# Patient Record
Sex: Male | Born: 1945
Health system: Southern US, Community
[De-identification: ages and names within clinical notes are randomized; demographics above are authoritative.]

## PROBLEM LIST (undated history)

## (undated) DIAGNOSIS — I10 Essential (primary) hypertension: Secondary | ICD-10-CM

## (undated) DIAGNOSIS — G20 Parkinson's disease: Secondary | ICD-10-CM

## (undated) DIAGNOSIS — K449 Diaphragmatic hernia without obstruction or gangrene: Secondary | ICD-10-CM

## (undated) DIAGNOSIS — K5730 Diverticulosis of large intestine without perforation or abscess without bleeding: Secondary | ICD-10-CM

## (undated) DIAGNOSIS — M1990 Unspecified osteoarthritis, unspecified site: Secondary | ICD-10-CM

## (undated) DIAGNOSIS — G20A1 Parkinson's disease without dyskinesia, without mention of fluctuations: Secondary | ICD-10-CM

## (undated) DIAGNOSIS — F411 Generalized anxiety disorder: Secondary | ICD-10-CM

## (undated) DIAGNOSIS — I25810 Atherosclerosis of coronary artery bypass graft(s) without angina pectoris: Secondary | ICD-10-CM

## (undated) DIAGNOSIS — Z8679 Personal history of other diseases of the circulatory system: Secondary | ICD-10-CM

## (undated) DIAGNOSIS — F3289 Other specified depressive episodes: Secondary | ICD-10-CM

## (undated) DIAGNOSIS — K222 Esophageal obstruction: Secondary | ICD-10-CM

## (undated) DIAGNOSIS — E785 Hyperlipidemia, unspecified: Secondary | ICD-10-CM

## (undated) DIAGNOSIS — R1013 Epigastric pain: Secondary | ICD-10-CM

## (undated) DIAGNOSIS — F329 Major depressive disorder, single episode, unspecified: Secondary | ICD-10-CM

## (undated) DIAGNOSIS — I639 Cerebral infarction, unspecified: Secondary | ICD-10-CM

## (undated) DIAGNOSIS — K219 Gastro-esophageal reflux disease without esophagitis: Secondary | ICD-10-CM

## (undated) DIAGNOSIS — D126 Benign neoplasm of colon, unspecified: Secondary | ICD-10-CM

## (undated) DIAGNOSIS — I340 Nonrheumatic mitral (valve) insufficiency: Secondary | ICD-10-CM

## (undated) DIAGNOSIS — Z860100 Personal history of colon polyps, unspecified: Secondary | ICD-10-CM

## (undated) DIAGNOSIS — I2510 Atherosclerotic heart disease of native coronary artery without angina pectoris: Secondary | ICD-10-CM

## (undated) HISTORY — DX: Unspecified osteoarthritis, unspecified site: M19.90

## (undated) HISTORY — PX: CORONARY ARTERY BYPASS GRAFT: SHX141

## (undated) HISTORY — PX: LUNG BIOPSY: SHX232

## (undated) HISTORY — DX: Atherosclerotic heart disease of native coronary artery without angina pectoris: I25.10

## (undated) HISTORY — DX: Personal history of colonic polyps: Z86.010

## (undated) HISTORY — DX: Nonrheumatic mitral (valve) insufficiency: I34.0

## (undated) HISTORY — DX: Other specified depressive episodes: F32.89

## (undated) HISTORY — DX: Personal history of other diseases of the circulatory system: Z86.79

## (undated) HISTORY — DX: Benign neoplasm of colon, unspecified: D12.6

## (undated) HISTORY — PX: POLYPECTOMY: SHX149

## (undated) HISTORY — DX: Essential (primary) hypertension: I10

## (undated) HISTORY — DX: Esophageal obstruction: K22.2

## (undated) HISTORY — DX: Parkinson's disease: G20

## (undated) HISTORY — DX: Diverticulosis of large intestine without perforation or abscess without bleeding: K57.30

## (undated) HISTORY — DX: Cerebral infarction, unspecified: I63.9

## (undated) HISTORY — DX: Diaphragmatic hernia without obstruction or gangrene: K44.9

## (undated) HISTORY — PX: PTCA: SHX146

## (undated) HISTORY — DX: Hyperlipidemia, unspecified: E78.5

## (undated) HISTORY — DX: Personal history of colon polyps, unspecified: Z86.0100

## (undated) HISTORY — PX: INGUINAL HERNIA REPAIR: SUR1180

## (undated) HISTORY — PX: COLONOSCOPY: SHX174

## (undated) HISTORY — DX: Major depressive disorder, single episode, unspecified: F32.9

## (undated) HISTORY — DX: Gastro-esophageal reflux disease without esophagitis: K21.9

## (undated) HISTORY — DX: Generalized anxiety disorder: F41.1

## (undated) HISTORY — DX: Parkinson's disease without dyskinesia, without mention of fluctuations: G20.A1

---
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
## 2002-03-09 ENCOUNTER — Inpatient Hospital Stay (HOSPITAL_COMMUNITY): Admission: EM | Admit: 2002-03-09 | Discharge: 2002-03-10 | Payer: Self-pay | Admitting: *Deleted

## 2002-03-09 ENCOUNTER — Encounter: Payer: Self-pay | Admitting: *Deleted

## 2002-03-10 ENCOUNTER — Encounter: Payer: Self-pay | Admitting: Cardiology

## 2002-04-11 ENCOUNTER — Encounter: Admission: RE | Admit: 2002-04-11 | Discharge: 2002-04-11 | Payer: Self-pay | Admitting: Internal Medicine

## 2002-04-11 ENCOUNTER — Encounter: Payer: Self-pay | Admitting: Internal Medicine

## 2004-11-27 ENCOUNTER — Ambulatory Visit: Payer: Self-pay | Admitting: Internal Medicine

## 2004-12-20 ENCOUNTER — Ambulatory Visit: Payer: Self-pay | Admitting: Internal Medicine

## 2004-12-22 ENCOUNTER — Inpatient Hospital Stay (HOSPITAL_COMMUNITY): Admission: AD | Admit: 2004-12-22 | Discharge: 2005-01-04 | Payer: Self-pay | Admitting: *Deleted

## 2004-12-22 ENCOUNTER — Ambulatory Visit: Payer: Self-pay | Admitting: *Deleted

## 2004-12-23 ENCOUNTER — Ambulatory Visit: Payer: Self-pay | Admitting: Cardiology

## 2005-01-18 ENCOUNTER — Ambulatory Visit: Payer: Self-pay | Admitting: Cardiology

## 2005-02-14 ENCOUNTER — Ambulatory Visit: Payer: Self-pay | Admitting: Internal Medicine

## 2005-03-22 ENCOUNTER — Ambulatory Visit: Payer: Self-pay | Admitting: Cardiology

## 2005-04-26 ENCOUNTER — Ambulatory Visit: Payer: Self-pay | Admitting: Cardiology

## 2005-07-24 ENCOUNTER — Ambulatory Visit: Payer: Self-pay | Admitting: Cardiology

## 2005-07-24 ENCOUNTER — Ambulatory Visit: Payer: Self-pay

## 2005-07-25 ENCOUNTER — Ambulatory Visit: Payer: Self-pay | Admitting: Cardiology

## 2006-08-30 ENCOUNTER — Ambulatory Visit: Payer: Self-pay | Admitting: Internal Medicine

## 2006-08-30 LAB — CONVERTED CEMR LAB
Albumin: 3.2 g/dL — ABNORMAL LOW (ref 3.5–5.2)
Alkaline Phosphatase: 59 units/L (ref 39–117)
BUN: 21 mg/dL (ref 6–23)
Basophils Absolute: 0 10*3/uL (ref 0.0–0.1)
Bilirubin Urine: NEGATIVE
Eosinophils Absolute: 0.5 10*3/uL (ref 0.0–0.6)
GFR calc Af Amer: 79 mL/min
HDL: 35.4 mg/dL — ABNORMAL LOW (ref >39.0)
Hemoglobin, Urine: NEGATIVE
Hemoglobin: 14.2 g/dL (ref 13.0–17.0)
Leukocytes, UA: NEGATIVE
Lymphocytes Relative: 20.2 % (ref 12.0–46.0)
MCHC: 34.3 g/dL (ref 30.0–36.0)
Monocytes Absolute: 0.6 10*3/uL (ref 0.2–0.7)
Monocytes Relative: 9.4 % (ref 3.0–11.0)
Neutro Abs: 4.2 10*3/uL (ref 1.4–7.7)
PSA: 1.57 ng/mL
Potassium: 5.1 meq/L (ref 3.5–5.1)
TSH: 2.02 microintl units/mL (ref 0.35–5.50)
Total CHOL/HDL Ratio: 3.9
Total Protein: 6.5 g/dL (ref 6.0–8.3)
Triglycerides: 89 mg/dL (ref 0–149)
VLDL: 18 mg/dL (ref 0–40)

## 2006-09-03 ENCOUNTER — Ambulatory Visit: Payer: Self-pay | Admitting: Internal Medicine

## 2006-09-12 ENCOUNTER — Ambulatory Visit: Payer: Self-pay

## 2006-09-19 ENCOUNTER — Encounter: Payer: Self-pay | Admitting: Internal Medicine

## 2006-09-19 DIAGNOSIS — E669 Obesity, unspecified: Secondary | ICD-10-CM | POA: Insufficient documentation

## 2006-09-19 DIAGNOSIS — K219 Gastro-esophageal reflux disease without esophagitis: Secondary | ICD-10-CM | POA: Insufficient documentation

## 2006-09-19 DIAGNOSIS — F411 Generalized anxiety disorder: Secondary | ICD-10-CM

## 2006-09-19 DIAGNOSIS — I2510 Atherosclerotic heart disease of native coronary artery without angina pectoris: Secondary | ICD-10-CM

## 2006-09-19 DIAGNOSIS — F329 Major depressive disorder, single episode, unspecified: Secondary | ICD-10-CM | POA: Insufficient documentation

## 2006-09-19 DIAGNOSIS — Z8673 Personal history of transient ischemic attack (TIA), and cerebral infarction without residual deficits: Secondary | ICD-10-CM | POA: Insufficient documentation

## 2006-09-19 DIAGNOSIS — M1990 Unspecified osteoarthritis, unspecified site: Secondary | ICD-10-CM | POA: Insufficient documentation

## 2006-09-19 DIAGNOSIS — Z8679 Personal history of other diseases of the circulatory system: Secondary | ICD-10-CM

## 2006-09-19 DIAGNOSIS — I10 Essential (primary) hypertension: Secondary | ICD-10-CM

## 2006-09-19 DIAGNOSIS — J309 Allergic rhinitis, unspecified: Secondary | ICD-10-CM | POA: Insufficient documentation

## 2006-09-19 DIAGNOSIS — E785 Hyperlipidemia, unspecified: Secondary | ICD-10-CM

## 2006-09-19 DIAGNOSIS — F528 Other sexual dysfunction not due to a substance or known physiological condition: Secondary | ICD-10-CM

## 2006-09-19 DIAGNOSIS — Z872 Personal history of diseases of the skin and subcutaneous tissue: Secondary | ICD-10-CM | POA: Insufficient documentation

## 2006-10-08 ENCOUNTER — Ambulatory Visit: Payer: Self-pay | Admitting: Internal Medicine

## 2006-10-22 ENCOUNTER — Ambulatory Visit: Payer: Self-pay | Admitting: Internal Medicine

## 2006-10-22 ENCOUNTER — Encounter: Payer: Self-pay | Admitting: Internal Medicine

## 2007-03-20 DIAGNOSIS — K449 Diaphragmatic hernia without obstruction or gangrene: Secondary | ICD-10-CM | POA: Insufficient documentation

## 2007-03-20 DIAGNOSIS — K5730 Diverticulosis of large intestine without perforation or abscess without bleeding: Secondary | ICD-10-CM | POA: Insufficient documentation

## 2007-03-20 DIAGNOSIS — D126 Benign neoplasm of colon, unspecified: Secondary | ICD-10-CM

## 2007-10-08 ENCOUNTER — Ambulatory Visit: Payer: Self-pay | Admitting: Internal Medicine

## 2007-10-08 DIAGNOSIS — I498 Other specified cardiac arrhythmias: Secondary | ICD-10-CM

## 2007-10-08 DIAGNOSIS — J019 Acute sinusitis, unspecified: Secondary | ICD-10-CM

## 2007-11-24 ENCOUNTER — Ambulatory Visit: Payer: Self-pay | Admitting: Internal Medicine

## 2007-11-24 LAB — CONVERTED CEMR LAB
ALT: 19 units/L (ref 0–53)
Albumin: 3.4 g/dL — ABNORMAL LOW (ref 3.5–5.2)
BUN: 13 mg/dL (ref 6–23)
Basophils Relative: 0.8 % (ref 0.0–3.0)
Bilirubin Urine: NEGATIVE
Calcium: 9.4 mg/dL (ref 8.4–10.5)
Creatinine, Ser: 1.2 mg/dL (ref 0.4–1.5)
Eosinophils Relative: 9 % — ABNORMAL HIGH (ref 0.0–5.0)
GFR calc Af Amer: 79 mL/min
Glucose, Bld: 92 mg/dL (ref 70–99)
HCT: 41.4 % (ref 39.0–52.0)
HDL: 35.6 mg/dL — ABNORMAL LOW (ref >39.0)
Hemoglobin: 14.2 g/dL (ref 13.0–17.0)
Monocytes Absolute: 0.6 10*3/uL (ref 0.1–1.0)
Monocytes Relative: 8.4 % (ref 3.0–12.0)
Neutro Abs: 5.1 10*3/uL (ref 1.4–7.7)
Nitrite: NEGATIVE
RBC: 4.59 M/uL (ref 4.22–5.81)
RDW: 13.6 % (ref 11.5–14.6)
TSH: 2.94 microintl units/mL (ref 0.35–5.50)
Total Protein, Urine: NEGATIVE mg/dL
Total Protein: 6.1 g/dL (ref 6.0–8.3)
WBC: 7.7 10*3/uL (ref 4.5–10.5)
pH: 7 (ref 5.0–8.0)

## 2007-11-25 ENCOUNTER — Ambulatory Visit: Payer: Self-pay | Admitting: Internal Medicine

## 2007-11-25 DIAGNOSIS — Z860100 Personal history of colon polyps, unspecified: Secondary | ICD-10-CM | POA: Insufficient documentation

## 2007-11-26 ENCOUNTER — Telehealth (INDEPENDENT_AMBULATORY_CARE_PROVIDER_SITE_OTHER): Payer: Self-pay | Admitting: *Deleted

## 2007-12-05 ENCOUNTER — Encounter (INDEPENDENT_AMBULATORY_CARE_PROVIDER_SITE_OTHER): Payer: Self-pay | Admitting: *Deleted

## 2008-01-16 ENCOUNTER — Ambulatory Visit: Payer: Self-pay | Admitting: Cardiology

## 2008-01-22 ENCOUNTER — Ambulatory Visit: Payer: Self-pay

## 2008-03-18 ENCOUNTER — Ambulatory Visit: Payer: Self-pay | Admitting: Cardiology

## 2008-04-24 ENCOUNTER — Telehealth: Payer: Self-pay | Admitting: Internal Medicine

## 2008-04-24 ENCOUNTER — Ambulatory Visit: Payer: Self-pay | Admitting: Family Medicine

## 2008-05-03 ENCOUNTER — Telehealth: Payer: Self-pay | Admitting: Internal Medicine

## 2009-01-04 ENCOUNTER — Ambulatory Visit: Payer: Self-pay | Admitting: Internal Medicine

## 2009-01-04 DIAGNOSIS — R062 Wheezing: Secondary | ICD-10-CM | POA: Insufficient documentation

## 2009-01-05 ENCOUNTER — Ambulatory Visit: Payer: Self-pay | Admitting: Internal Medicine

## 2009-01-05 LAB — CONVERTED CEMR LAB
ALT: 23 units/L (ref 0–53)
AST: 30 units/L (ref 0–37)
Albumin: 3.6 g/dL (ref 3.5–5.2)
Bilirubin Urine: NEGATIVE
Calcium: 10 mg/dL (ref 8.4–10.5)
Creatinine, Ser: 1.1 mg/dL (ref 0.4–1.5)
Eosinophils Relative: 2.1 % (ref 0.0–5.0)
GFR calc non Af Amer: 71.48 mL/min (ref >60)
HCT: 41.3 % (ref 39.0–52.0)
HDL: 47.9 mg/dL (ref >39.00)
Hemoglobin, Urine: NEGATIVE
Hemoglobin: 14.1 g/dL (ref 13.0–17.0)
Ketones, ur: NEGATIVE mg/dL
Leukocytes, UA: NEGATIVE
Lymphs Abs: 1.5 10*3/uL (ref 0.7–4.0)
MCV: 92.9 fL (ref 78.0–100.0)
Monocytes Absolute: 0.9 10*3/uL (ref 0.1–1.0)
Neutro Abs: 6.6 10*3/uL (ref 1.4–7.7)
Nitrite: NEGATIVE
Platelets: 243 10*3/uL (ref 150.0–400.0)
RDW: 12.9 % (ref 11.5–14.6)
Sodium: 144 meq/L (ref 135–145)
TSH: 1.64 microintl units/mL (ref 0.35–5.50)
Total Bilirubin: 0.6 mg/dL (ref 0.3–1.2)
Total Protein: 6.7 g/dL (ref 6.0–8.3)
Triglycerides: 104 mg/dL (ref 0.0–149.0)
Urobilinogen, UA: 0.2 (ref 0.0–1.0)
WBC: 9.2 10*3/uL (ref 4.5–10.5)
pH: 6 (ref 5.0–8.0)

## 2009-01-25 ENCOUNTER — Ambulatory Visit: Payer: Self-pay

## 2009-01-25 ENCOUNTER — Encounter: Payer: Self-pay | Admitting: Cardiology

## 2009-01-25 DIAGNOSIS — I6529 Occlusion and stenosis of unspecified carotid artery: Secondary | ICD-10-CM

## 2009-01-26 ENCOUNTER — Encounter: Payer: Self-pay | Admitting: Cardiology

## 2009-03-31 ENCOUNTER — Telehealth: Payer: Self-pay | Admitting: Internal Medicine

## 2009-03-31 ENCOUNTER — Ambulatory Visit: Payer: Self-pay | Admitting: Cardiology

## 2009-03-31 DIAGNOSIS — I25810 Atherosclerosis of coronary artery bypass graft(s) without angina pectoris: Secondary | ICD-10-CM

## 2009-06-24 ENCOUNTER — Ambulatory Visit: Payer: Self-pay | Admitting: Internal Medicine

## 2009-08-30 ENCOUNTER — Ambulatory Visit: Payer: Self-pay | Admitting: Internal Medicine

## 2009-08-30 DIAGNOSIS — M25519 Pain in unspecified shoulder: Secondary | ICD-10-CM

## 2009-09-23 ENCOUNTER — Ambulatory Visit: Payer: Self-pay | Admitting: Internal Medicine

## 2009-12-05 ENCOUNTER — Ambulatory Visit: Payer: Self-pay | Admitting: Internal Medicine

## 2009-12-05 LAB — CONVERTED CEMR LAB
ALT: 20 units/L (ref 0–53)
AST: 20 units/L (ref 0–37)
BUN: 15 mg/dL (ref 6–23)
Basophils Absolute: 0 10*3/uL (ref 0.0–0.1)
Bilirubin Urine: NEGATIVE
Bilirubin, Direct: 0.1 mg/dL (ref 0.0–0.3)
Calcium: 9.3 mg/dL (ref 8.4–10.5)
Cholesterol: 156 mg/dL (ref 0–200)
Creatinine, Ser: 1.1 mg/dL (ref 0.4–1.5)
Eosinophils Relative: 8 % — ABNORMAL HIGH (ref 0.0–5.0)
GFR calc non Af Amer: 70.54 mL/min (ref >60)
Glucose, Bld: 95 mg/dL (ref 70–99)
HCT: 39.2 % (ref 39.0–52.0)
HDL: 39.5 mg/dL (ref >39.00)
Hemoglobin, Urine: NEGATIVE
LDL Cholesterol: 84 mg/dL (ref 0–99)
Lymphs Abs: 1.3 10*3/uL (ref 0.7–4.0)
Monocytes Relative: 8.2 % (ref 3.0–12.0)
Neutrophils Relative %: 64.6 % (ref 43.0–77.0)
Platelets: 245 10*3/uL (ref 150.0–400.0)
RDW: 14.1 % (ref 11.5–14.6)
TSH: 1.84 microintl units/mL (ref 0.35–5.50)
Total Bilirubin: 0.1 mg/dL — ABNORMAL LOW (ref 0.3–1.2)
Total Protein, Urine: NEGATIVE mg/dL
Urine Glucose: NEGATIVE mg/dL
Urobilinogen, UA: 0.2 (ref 0.0–1.0)
VLDL: 33 mg/dL (ref 0.0–40.0)
WBC: 7.2 10*3/uL (ref 4.5–10.5)

## 2009-12-08 ENCOUNTER — Encounter: Payer: Self-pay | Admitting: Internal Medicine

## 2009-12-08 ENCOUNTER — Ambulatory Visit: Payer: Self-pay | Admitting: Internal Medicine

## 2010-01-29 ENCOUNTER — Encounter: Payer: Self-pay | Admitting: Cardiothoracic Surgery

## 2010-02-01 ENCOUNTER — Telehealth: Payer: Self-pay | Admitting: Internal Medicine

## 2010-02-07 NOTE — Assessment & Plan Note (Signed)
Summary: ?shot for shoulder-lb   Vital Signs:  Patient profile:   65 year old male Height:      65 inches Weight:      168.50 pounds BMI:     28.14 O2 Sat:      95 % on Room air Temp:     98 degrees F oral Pulse rate:   54 / minute BP sitting:   132 / 70  (left arm) Cuff size:   regular  Vitals Entered By: Zella Ball Ewing CMA [facility]ull) (September 23, 2009 8:28 AM)  O2 Flow:  Room air CC: Left shoulder pain/RE   Primary Care Provider:  Corwin Levins MD  CC:  Left shoulder pain/RE.  History of Present Illness: here for acute - c/o 2 mo ongoing pain to the left shoudler, specifically the prox bicep area and also the distal bicep area, mild to mod but persistent after shoveling 4 small dumptruck loads of dirt in the back yard not accessible by backhow approx 2 mo ago  Pt denies CP, worsening sob, doe, wheezing, orthopnea, pnd, worsening LE edema, palps, dizziness or syncope  Pt denies new neuro symptoms such as headache, facial or extremity weakness  No fever, wt loss, night sweats, loss of appetite or other constitutional symptoms  Denies worsening depressive symtpoms, suicidal ideation, anxiety or panic.  Shoulder pain worse still to lift in a supination manner., even approx 20 lbs, but o/w has FROM adn use of the shoulder.    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) 4)  Coronary Artery Disease  (ICD-414.00) 5)  Transient Ischemic Attack, Hx of  (ICD-V12.50) 6)  Carotid Artery Disease  (ICD-433.10) 7)  Hypertension  (ICD-401.9) 8)  Bradycardia  (ICD-427.89) 9)  Hyperlipidemia  (ICD-272.4) 10)  Obesity  (ICD-278.00) 11)  Anxiety  (ICD-300.00) 12)  Depression  (ICD-311) 13)  Gerd  (ICD-530.81) 14)  Wheezing  (ICD-786.07) 15)  Colonic Polyps, Hx of  (ICD-V12.72) 16)  Preventive Health Care  (ICD-V70.0) 17)  Sinusitis- Acute-nos  (ICD-461.9) 18)  Hiatal Hernia  (ICD-553.3) 19)   Diverticulosis, Colon  (ICD-562.10) 20)  Colonic Polyps  (ICD-211.3) 21)  Psoriasis, Hx of  (ICD-V13.3) 22)  Osteoarthritis  (ICD-715.90) 23)  Allergic Rhinitis  (ICD-477.9) 24)  Erectile Dysfunction  (ICD-302.72)  Medications Prior to Update: 1)  Plavix 75 Mg  Tabs (Clopidogrel Bisulfate) .... Take 1 By Mouth Once Daily 2)  Pravastatin Sodium 40 Mg Tabs (Pravastatin Sodium) .[name].. 1 Tab By Mouth Daily 3)  Nexium 40 Mg Cpdr (Esomeprazole Magnesium) .[name].. 1po Once Daily 4)  Lisinopril 5 Mg Tabs (Lisinopril) .[name].. 1 Tab By Mouth Daily 5)  Levitra 20 Mg Tabs (Vardenafil Hcl) .[name].. 1 By Mouth As Needed 6)  Cephalexin 500 Mg Caps (Cephalexin) .[name].. 1po Three Times A Day 7)  Promethazine-Codeine 6.25-10 Mg/26ml Syrp (Promethazine-Codeine) .[name].. 1 Tsp By Mouth Q 6 Hrs As Needed 8)  Prednisone 10 Mg Tabs (Prednisone) .... 3po Qd For 3days, Then 2po Qd For 3days, Then 1po Qd For 3days, Then Stop  Current Medications (verified): 1)  Plavix 75 Mg  Tabs (Clopidogrel Bisulfate) .... Take 1 By Mouth Once Daily 2)  Pravastatin Sodium 40 Mg Tabs (Pravastatin Sodium) .[name].. 1 Tab By Mouth Daily 3)  Nexium 40 Mg Cpdr (Esomeprazole Magnesium) .[name].. 1po Once Daily 4)  Lisinopril 5 Mg Tabs (Lisinopril) .[name].. 1 Tab By Mouth Daily 5)  Levitra 20  Mg Tabs (Vardenafil Hcl) .[name].. 1 By Mouth As Needed 6)  Hydrocodone-Acetaminophen 7.5-325 Mg Tabs (Hydrocodone-Acetaminophen) .[name].. 1 By Mouth Q 6 Hrs As Needed  Allergies (verified): 1)  ! Lipitor 2)  ! Zocor  Past History:  Past Medical History: Last updated: 03/25/2009 Current Problems:  CORONARY ARTERY DISEASE (ICD-414.00) TRANSIENT ISCHEMIC ATTACK, HX OF (ICD-V12.50) CAROTID ARTERY DISEASE (ICD-433.10) HYPERTENSION (ICD-401.9) BRADYCARDIA (ICD-427.89) HYPERLIPIDEMIA (ICD-272.4) OBESITY (ICD-278.00) ANXIETY (ICD-300.00) DEPRESSION (ICD-311) GERD (ICD-530.81) WHEEZING (ICD-786.07) COLONIC POLYPS, HX OF (ICD-V12.72) PREVENTIVE HEALTH CARE (ICD-V70.0) SINUSITIS-  ACUTE-NOS (ICD-461.9) HIATAL HERNIA (ICD-553.3) DIVERTICULOSIS, COLON (ICD-562.10) COLONIC POLYPS (ICD-211.3) PSORIASIS, HX OF (ICD-V13.3) OSTEOARTHRITIS (ICD-715.90) ALLERGIC RHINITIS (ICD-477.9) ERECTILE DYSFUNCTION (ICD-302.72)  Past Surgical History: Last updated: 03/31/2009 Lung Biopsy Inguinal herniorrhaphy PTCA/stent-1998 s/p lipoma right thigh CABG  Dec 2006 times 2--LIMA to LAD, SVG to OM  Social History: Last updated: 06/24/2009 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   Head:  normocephalic and atraumatic.   Eyes:  vision grossly intact, pupils equal, and pupils round.   Ears:  lfet tm mild erythema, right tm ok Nose:  no external deformity and no nasal discharge.   Mouth:  no gingival abnormalities and pharyngeal erythema.   Neck:  supple and no masses.   Lungs:  normal respiratory effort and normal breath sounds.   Heart:  normal rate and regular rhythm.   Msk:  moderate tender to the left bicipital tendon insertion site, o/w left shoulder with FROM, and NT Extremities:  no edema, no erythema  Neurologic:  strength normal in all upper extremities, sensation intact to light touch, and DTRs symmetrical and normal.   Psych:  not depressed appearing and moderately anxious.     Impression & Recommendations:  Problem # 1:  SHOULDER PAIN, LEFT (ICD-719.41)  persistent, c/w bicipital tendonitis, ok fo home excercises, pain med as needed, and for now pt declines outpt PT, ortho referral, or repeat pred pack  His updated medication list for this problem includes:    Hydrocodone-acetaminophen 7.5-325 Mg Tabs (Hydrocodone-acetaminophen) .[name]... 1 by mouth q 6 hrs as needed  Problem # 2:  HYPERTENSION (ICD-401.9)  His updated medication list for this problem  includes:    Lisinopril 5 Mg Tabs (Lisinopril) .[name]... 1 tab by mouth daily  BP today: 132/70 Prior BP: 130/70 (08/30/2009)  Labs Reviewed: K+: 4.5 (01/05/2009) Creat: : 1.1 (01/05/2009)   Chol: 155 (01/05/2009)   HDL: 47.90 (01/05/2009)   LDL: 86 (01/05/2009)   TG: 104.0 (01/05/2009)  Problem # 3:  DEPRESSION (ICD-311) stable overall by hx and exam, ok to continue meds/tx as is - declines need for further ts at this time  Complete Medication List: 1)  Plavix 75 Mg Tabs (Clopidogrel bisulfate) .... Take 1 by mouth once daily 2)  Pravastatin Sodium 40 Mg Tabs (Pravastatin sodium) .[name].. 1 tab by mouth daily 3)  Nexium 40 Mg Cpdr (Esomeprazole magnesium) .[name].. 1po once daily 4)  Lisinopril 5 Mg Tabs (Lisinopril) .[name].. 1 tab by mouth daily 5)  Levitra 20 Mg Tabs (Vardenafil hcl) .[name].. 1 by mouth as needed 6)  Hydrocodone-acetaminophen 7.5-325 Mg Tabs (Hydrocodone-acetaminophen) .[name].. 1 by mouth q 6 hrs as needed  Patient Instructions: 1)  Please take all new medications as prescribed 2)  Continue all previous medications as before this visit  3)  Please schedule a follow-up appointment in 3 months  - dec 2011 with "yearly medicare exam" or sooner if needed Prescriptions: HYDROCODONE-ACETAMINOPHEN 7.5-325 MG TABS (HYDROCODONE-ACETAMINOPHEN) 1 by mouth q 6 hrs as needed  #40 x 1   Entered and Authorized by:   Corwin Levins MD   Signed by:   Corwin Levins MD on 09/23/2009   Method used:   Print then Give to Patient   RxID:   [id number]

## 2010-02-07 NOTE — Progress Notes (Signed)
Summary: RX refill  Phone Note Refill Request Call back at Home Phone [phone] Message from:  Patient on March 31, 2009 12:53 PM  Refills Requested: Medication #1:  NEXIUM 40 MG CPDR 1po once daily  Medication #2:  LISINOPRIL 5 MG TABS 1 tab by mouth daily  Medication #3:  PLAVIX 75 MG  TABS take 1 by mouth once daily  Medication #4:  PRAVASTATIN SODIUM 40 MG TABS 1 tab by mouth daily Patient is completely out of Nexium.  Initial call taken by: Lucious Groves,  March 31, 2009 12:53 PM  Follow-up for Phone Call        Nexium samples up front for pickup. Patient son will have him call the office. Follow-up by: Lucious Groves,  March 31, 2009 1:04 PM  Additional Follow-up for Phone Call Additional follow up Details #1::        Patient came by the office and picked up Nexium samples. Additional Follow-up by: Lucious Groves,  March 31, 2009 3:14 PM    Prescriptions: LISINOPRIL 5 MG TABS (LISINOPRIL) 1 tab by mouth daily  #90 x 3   Entered by:   Lucious Groves   Authorized by:   Corwin Levins MD   Signed by:   Lucious Groves on 03/31/2009   Method used:   Faxed to ...       [facility]* (mail-order)             ,          Ph: [phone]       Fax: [phone]   RxID:   [id number] NEXIUM 40 MG CPDR (ESOMEPRAZOLE MAGNESIUM) 1po once daily  #90 x 3   Entered by:   Lucious Groves   Authorized by:   Corwin Levins MD   Signed by:   Lucious Groves on 03/31/2009   Method used:   Faxed to ...       [facility]* (mail-order)             ,          Ph: [phone]       Fax: [phone]   RxID:   [id number] PRAVASTATIN SODIUM 40 MG TABS (PRAVASTATIN SODIUM) 1 tab by mouth daily  #90 x 3   Entered by:   Lucious Groves   Authorized by:   Corwin Levins MD   Signed by:   Lucious Groves on 03/31/2009   Method used:   Faxed to ...       [facility]* (mail-order)             ,          Ph: [phone]       Fax: [phone]   RxID:   [id number] PLAVIX 75 MG  TABS (CLOPIDOGREL  BISULFATE) take 1 by mouth once daily  #90 x 3   Entered by:   Lucious Groves   Authorized by:   Corwin Levins MD   Signed by:   Lucious Groves on 03/31/2009   Method used:   Faxed to ...       [facility]* (mail-order)             ,          Ph: [phone]       Fax: [phone]   RxID:   [id number]

## 2010-02-07 NOTE — Miscellaneous (Signed)
Summary: Orders Update  Clinical Lists Changes  Problems: Added new problem of CAROTID ARTERY DISEASE (ICD-433.10) Orders: Added new Test order of Carotid Duplex (Carotid Duplex) - Signed 

## 2010-02-07 NOTE — Assessment & Plan Note (Signed)
Summary: COUGH/ SHOULDER PROBLEM /NWS   Vital Signs:  Patient profile:   65 year old male Height:      65 inches Weight:      167.50 pounds BMI:     27.97 O2 Sat:      94 % on Room air Temp:     99.3 degrees F oral Pulse rate:   54 / minute BP sitting:   130 / 70  (left arm) Cuff size:   regular  Vitals Entered By: Zella Ball Ewing CMA [facility]) (August 30, 2009 9:26 AM)  O2 Flow:  Room air CC: Left shoulder pain, cough, congestion and wheezing for 2 weeks/RE   Primary Care Provider:  Corwin Levins MD  CC:  Left shoulder pain, cough, and congestion and wheezing for 2 weeks/RE.  History of Present Illness: here with acute onset 3 days fever, ST, headache, general malaise and weakness and prod cough, now with onset mild wheezing and sob/doe this am though very mild, and not affecting his overall functional or ambulatory ability.  Pt denies CP, orthopnea, pnd, worsening LE edema, palps, dizziness or syncope Pt denies new neuro symptoms such as headache, facial or extremity weakness  No wt loss, night sweats, loss of appetite or other constitutional symptoms . Overall good complaicne with meds and good tolerability though he is wary of taking prednisone due to irritability with doing this.  Also with lfet shoulder pain for 1 wk after shoveling with a family mme ber; still with ant left shoulder soreness and tender, and some slight decrased ROM, just wondinering if he really hurt himself.  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) 2)  Coronary Artery Disease  (ICD-414.00) 3)  Transient Ischemic Attack, Hx of  (ICD-V12.50) 4)  Carotid Artery Disease  (ICD-433.10) 5)  Hypertension  (ICD-401.9) 6)  Bradycardia  (ICD-427.89) 7)  Hyperlipidemia  (ICD-272.4) 8)  Obesity  (ICD-278.00) 9)  Anxiety  (ICD-300.00) 10)  Depression  (ICD-311) 11)  Gerd  (ICD-530.81) 12)  Wheezing  (ICD-786.07) 13)  Colonic  Polyps, Hx of  (ICD-V12.72) 14)  Preventive Health Care  (ICD-V70.0) 15)  Sinusitis- Acute-nos  (ICD-461.9) 16)  Hiatal Hernia  (ICD-553.3) 17)  Diverticulosis, Colon  (ICD-562.10) 18)  Colonic Polyps  (ICD-211.3) 19)  Psoriasis, Hx of  (ICD-V13.3) 20)  Osteoarthritis  (ICD-715.90) 21)  Allergic Rhinitis  (ICD-477.9) 22)  Erectile Dysfunction  (ICD-302.72)  Medications Prior to Update: 1)  Plavix 75 Mg  Tabs (Clopidogrel Bisulfate) .... Take 1 By Mouth Once Daily 2)  Pravastatin Sodium 40 Mg Tabs (Pravastatin Sodium) .[name].. 1 Tab By Mouth Daily 3)  Nexium 40 Mg Cpdr (Esomeprazole Magnesium) .[name].. 1po Once Daily 4)  Lisinopril 5 Mg Tabs (Lisinopril) .[name].. 1 Tab By Mouth Daily 5)  Levitra 20 Mg Tabs (Vardenafil Hcl) .[name].. 1 By Mouth As Needed 6)  Azithromycin 250 Mg Tabs (Azithromycin) .... 2po Qd For 1 Day, Then 1po Qd For 4days, Then Stop 7)  Promethazine-Codeine 6.25-10 Mg/65ml Syrp (Promethazine-Codeine) .[name].. 1 Tsp By Mouth Q 6 Hrs As Needed  Current Medications (verified): 1)  Plavix 75 Mg  Tabs (Clopidogrel Bisulfate) .... Take 1 By Mouth Once Daily 2)  Pravastatin Sodium 40 Mg Tabs (Pravastatin Sodium) .[name].. 1 Tab By Mouth Daily 3)  Nexium 40 Mg Cpdr (Esomeprazole Magnesium) .[name].. 1po Once Daily 4)  Lisinopril 5 Mg Tabs (Lisinopril) .[name].. 1 Tab By Mouth Daily 5)  Levitra 20 Mg Tabs (Vardenafil Hcl) .[name].. 1 By Mouth As Needed 6)  Cephalexin 500 Mg Caps (Cephalexin) .[name].. 1po Three Times A Day 7)  Promethazine-Codeine 6.25-10 Mg/94ml Syrp (Promethazine-Codeine) .[name].. 1 Tsp By Mouth Q 6 Hrs As Needed 8)  Prednisone 10 Mg Tabs (Prednisone) .... 3po Qd For 3days, Then 2po Qd For 3days, Then 1po Qd For 3days, Then Stop  Allergies (verified): 1)  ! Lipitor 2)  ! Zocor  Past History:  Past Medical History: Last updated: 03/25/2009 Current Problems:  CORONARY ARTERY DISEASE (ICD-414.00) TRANSIENT ISCHEMIC ATTACK, HX OF (ICD-V12.50) CAROTID ARTERY DISEASE (ICD-433.10) HYPERTENSION  (ICD-401.9) BRADYCARDIA (ICD-427.89) HYPERLIPIDEMIA (ICD-272.4) OBESITY (ICD-278.00) ANXIETY (ICD-300.00) DEPRESSION (ICD-311) GERD (ICD-530.81) WHEEZING (ICD-786.07) COLONIC POLYPS, HX OF (ICD-V12.72) PREVENTIVE HEALTH CARE (ICD-V70.0) SINUSITIS- ACUTE-NOS (ICD-461.9) HIATAL HERNIA (ICD-553.3) DIVERTICULOSIS, COLON (ICD-562.10) COLONIC POLYPS (ICD-211.3) PSORIASIS, HX OF (ICD-V13.3) OSTEOARTHRITIS (ICD-715.90) ALLERGIC RHINITIS (ICD-477.9) ERECTILE DYSFUNCTION (ICD-302.72)  Past Surgical History: Last updated: 03/31/2009 Lung Biopsy Inguinal herniorrhaphy PTCA/stent-1998 s/p lipoma right thigh CABG  Dec 2006 times 2--LIMA to LAD, SVG to OM  Family History: Last updated: 03/25/2009 Family history of HTN  Social History: Last updated: 06/24/2009 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   Head:  normocephalic and atraumatic.   Eyes:  vision grossly intact, pupils equal, and pupils round.   Ears:  lfet tm mild erythema, right tm ok Nose:  no external deformity and no nasal discharge.   Mouth:  no gingival abnormalities and pharyngeal erythema.   Neck:  supple and no masses.   Lungs:  normal respiratory effort, R decreased breath sounds, and L decreased breath sounds.  with  very midl bilat wheezes Heart:  normal rate and regular rhythm.   Msk:  left shoudler with tender to left ant insertion site ; but essentially FROM and no swelling Extremities:  no edema, no erythema  Neurologic:  strength normal in all upper extremities, sensation intact to light touch, and DTRs symmetrical and normal.     Impression & Recommendations:  Problem # 1:  BRONCHITIS-ACUTE (ICD-466.0)  His updated medication list for this problem includes:    Cephalexin 500 Mg Caps  (Cephalexin) .[name]... 1po three times a day    Promethazine-codeine 6.25-10 Mg/15ml Syrp (Promethazine-codeine) .[name]... 1 tsp by mouth q 6 hrs as needed treat as above, f/u any worsening signs or symptoms   Problem # 2:  WHEEZING (ICD-786.07) Assessment: New liekly due to above, for pred pack by mouth x 1  Problem # 3:  HYPERTENSION (ICD-401.9)  His updated medication list for this problem includes:    Lisinopril 5 Mg Tabs (Lisinopril) .[name]... 1 tab by mouth daily  BP today: 130/70 Prior BP: 128/80 (06/24/2009)  Labs Reviewed: K+: 4.5 (01/05/2009) Creat: : 1.1 (01/05/2009)   Chol: 155 (01/05/2009)   HDL: 47.90 (01/05/2009)   LDL: 86 (01/05/2009)   TG: 104.0 (01/05/2009) stable overall by hx and exam, ok to continue meds/tx as is   Problem # 4:  SHOULDER PAIN, LEFT (ICD-719.41) c/w bicep tendonitis/strain due to recent shoveling - treat as above, f/u any worsening signs or symptoms   Complete Medication List: 1)  Plavix 75 Mg Tabs (Clopidogrel bisulfate) .... Take 1 by mouth once daily 2)  Pravastatin Sodium 40 Mg Tabs (Pravastatin sodium) .[name].. 1 tab by  mouth daily 3)  Nexium 40 Mg Cpdr (Esomeprazole magnesium) .[name].. 1po once daily 4)  Lisinopril 5 Mg Tabs (Lisinopril) .[name].. 1 tab by mouth daily 5)  Levitra 20 Mg Tabs (Vardenafil hcl) .[name].. 1 by mouth as needed 6)  Cephalexin 500 Mg Caps (Cephalexin) .[name].. 1po three times a day 7)  Promethazine-codeine 6.25-10 Mg/79ml Syrp (Promethazine-codeine) .[name].. 1 tsp by mouth q 6 hrs as needed 8)  Prednisone 10 Mg Tabs (Prednisone) .... 3po qd for 3days, then 2po qd for 3days, then 1po qd for 3days, then stop  Patient Instructions: 1)  Please take all new medications as prescribed 2)  Continue all previous medications as before this visit  3)  Please schedule a follow-up appointment in Dec 2011 for "yearly medicare exam" Prescriptions: PREDNISONE 10 MG TABS (PREDNISONE) 3po qd for 3days, then 2po qd for 3days, then 1po qd for 3days, then stop  #18 x  0   Entered and Authorized by:   Corwin Levins MD   Signed by:   Corwin Levins MD on 08/30/2009   Method used:   Print then Give to Patient   RxID:   [id number] CEPHALEXIN 500 MG CAPS (CEPHALEXIN) 1po three times a day  #30 x 0   Entered and Authorized by:   Corwin Levins MD   Signed by:   Corwin Levins MD on 08/30/2009   Method used:   Print then Give to Patient   RxID:   [id number] PROMETHAZINE-CODEINE 6.25-10 MG/5ML SYRP (PROMETHAZINE-CODEINE) 1 tsp by mouth q 6 hrs as needed  #6oz x 1   Entered and Authorized by:   Corwin Levins MD   Signed by:   Corwin Levins MD on 08/30/2009   Method used:   Print then Give to Patient   RxID:   [id number] CEPHALEXIN 500 MG CAPS (CEPHALEXIN) 1po three times a day  #300 x 0   Entered and Authorized by:   Corwin Levins MD   Signed by:   Corwin Levins MD on 08/30/2009   Method used:   Print then Give to Patient   RxID:   [id number]

## 2010-02-07 NOTE — Assessment & Plan Note (Signed)
Summary: f1y    Visit Type:  1 year folloew up Primary Provider:  Corwin Levins MD  CC:  Sob.  History of Present Illness: He is preaching three times per week, and doing well.  No chest pain.  Has a hiatal hernia, he will get short of breath with over exertion.  Sometimes this includes working in the yard, or rushing around.  Mild discomfort eases off.  Had CABG in 2006, and negative nuclear scan in 2008.  He is not exercising all that much.    Current Medications (verified): 1)  Plavix 75 Mg  Tabs (Clopidogrel Bisulfate) .... Take 1 By Mouth Once Daily 2)  Pravastatin Sodium 40 Mg Tabs (Pravastatin Sodium) .[name].. 1 Tab By Mouth Daily 3)  Nexium 40 Mg Cpdr (Esomeprazole Magnesium) .[name].. 1po Once Daily 4)  Lisinopril 5 Mg Tabs (Lisinopril) .[name].. 1 Tab By Mouth Daily 5)  Levitra 20 Mg Tabs (Vardenafil Hcl) .[name].. 1 By Mouth As Needed  Allergies: 1)  ! Lipitor 2)  ! Zocor  Past History:  Past Medical History: Last updated: 11/25/2007 Erectile dysfunction Obesity Allergic rhinitis Anxiety Coronary artery disease Depression GERD Hyperlipidemia Hypertension Transient ischemic attack, hx of- March 2004 Osteoarthritis- Hands Psoriasis Diverticulosis, colon Colonic polyps, hx of  Family History: Last updated: 11/25/2007 HTN  Social History: Last updated: 11/25/2007 Married Occupation: pastor Former Smoker Alcohol use-no 3 children  Past Surgical History: Lung Biopsy Inguinal herniorrhaphy PTCA/stent-1998 s/p lipoma right thigh CABG  Dec 2006 times 2--LIMA to LAD, SVG to OM  Vital Signs:  Patient profile:   64 year old male Height:      64 inches Weight:      171.75 pounds BMI:     29.59 Pulse rate:   50 / minute Pulse rhythm:   irregular Resp:     18 per minute BP sitting:   144 / 72  (left arm) Cuff size:   large  Vitals Entered By: Vikki Ports (March 31, 2009 3:54 PM)  Physical Exam  Neck:  Neck supple, no JVD. No masses, thyromegaly or abnormal  cervical nodes. Chest Wall:  Mild gynecomastia Lungs:  Clear bilaterally to auscultation and percussion. Heart:  PMI non displaced.  Normal S1 and S2.  No murmur, rub, or gallop.   Abdomen:  Bowel sounds positive; abdomen soft and non-tender without masses, organomegaly, or hernias noted. No hepatosplenomegaly. Pulses:  pulses normal in all 4 extremities Extremities:  No clubbing or cyanosis. Neurologic:  Alert and oriented x 3.   EKG  Procedure date:  03/31/2009  Findings:      NSR.  Nonspecific ST and T abnormalities, similar to prior tracings.  Lateral T inversion noted, and seen on prior tracings.   Cardiac Cath  Procedure date:  12/24/2004  Findings:       CONCLUSION:  1.  Preserved left ventricular function.  2.  No evidence of high-grade restenosis at the previous stent site from      1998.  3.  Progressive disease in the left anterior descending artery after the      stent location.  4.  High-grade stenosis of the dominant circumflex overlapping the origin of      first marginal.   DISPOSITION:  I will review the films with the cardiac surgeons. My leaning  is in the direction of surgical revascularization. The patient has had a  TIA, however, and this will need to be considered in the big picture.     Cardiac Cath  Procedure  date:  12/28/2004  Findings:       OPERATION:  Coronary artery bypass grafting x2 utilizing left internal  mammary artery graft to the LAD and a endoscopically harvested saphenous  vein graft to the distal circumflex with placement of the On-Q wound  catheter system.   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   SURGEON:  Kerin Perna, M.D.  Impression & Recommendations:  Problem # 1:  CAD, AUTOLOGOUS BYPASS GRAFT (ICD-414.02) Prior CABG times two.  Seemingly remaining stable at present without significant symptoms.  Does not take ASA.   His updated medication list for  this problem includes:    Plavix 75 Mg Tabs (Clopidogrel bisulfate) .[name]... Take 1 by mouth once daily    Lisinopril 5 Mg Tabs (Lisinopril) .[name]... 1 tab by mouth daily  Problem # 2:  CAROTID ARTERY DISEASE (ICD-433.10) See carotid studies from January 2011.  Report noted. His updated medication list for this problem includes:    Plavix 75 Mg Tabs (Clopidogrel bisulfate) .[name]... Take 1 by mouth once daily  Problem # 3:  HYPERTENSION (ICD-401.9) Under the management of Dr. Jonny Ruiz His updated medication list for this problem includes:    Lisinopril 5 Mg Tabs (Lisinopril) .[name]... 1 tab by mouth daily  Problem # 4:  HYPERLIPIDEMIA (ICD-272.4) Under the management of Dr. Jonny Ruiz.   Aim for targets.  Last LDL 86, HDL 48.  Will defer to Dr. Jonny Ruiz. His updated medication list for this problem includes:    Pravastatin Sodium 40 Mg Tabs (Pravastatin sodium) .[name]... 1 tab by mouth daily  Other Orders: EKG w/ Interpretation (93000)  Patient Instructions: 1)  Your physician recommends that you continue on your current medications as directed. Please refer to the Current Medication list given to you today. 2)  Your physician wants you to follow-up in 1 year:   You will receive a reminder letter in the mail two months in advance. If you don't receive a letter, please call our office to schedule the follow-up appointment.

## 2010-02-07 NOTE — Assessment & Plan Note (Signed)
Summary: CPX/ SECURE HORIZIONS /NWS   Vital Signs:  Patient profile:   65 year old male Height:      65 inches Weight:      169.38 pounds BMI:     28.29 O2 Sat:      95 % on Room air Temp:     99 degrees F oral Pulse rate:   54 / minute BP sitting:   142 / 70  (left arm) Cuff size:   regular  Vitals Entered By: Zella Ball Ewing CMA Duncan Dull) (December 08, 2009 10:40 AM)  O2 Flow:  Room air  CC: CPX/RE   Primary Care Provider:  Corwin Levins MD  CC:  CPX/RE.  History of Present Illness: here for wellness, overall doing well,  Pt denies CP, worsening sob, doe, wheezing, orthopnea, pnd, worsening LE edema, palps, dizziness or syncope  Pt denies new neuro symptoms such as headache, facial or extremity weakness  Pt denies polydipsia, polyuria  Overall good compliance with meds, trying to follow low chol, DM diet, wt stable, little excercise however.  No fever, wt loss, night sweats, loss of appetite or other constitutional symptoms  Denies worsening depressive symptoms, suicidal ideation, or panic.  Overall good compliance with meds, and good tolerability.  Does have very occasional reflux breakthruough but overall very well controlled, without abd pain, n/v/d, bowel change or blood.  Needs nexium refill.  Due for pneumovax, delcines flu shot.  Has mild arthritic pain to mult joints including the ankles, hips, shoulders.   Levitra is too expensvie at [facility].  BP there and at home < 140/90  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) 4)  Coronary Artery Disease  (ICD-414.00) 5)  Transient Ischemic Attack, Hx of  (ICD-V12.50) 6)  Carotid Artery Disease  (ICD-433.10) 7)  Hypertension  (ICD-401.9) 8)  Bradycardia  (ICD-427.89) 9)  Hyperlipidemia  (ICD-272.4) 10)  Obesity  (ICD-278.00) 11)  Anxiety  (ICD-300.00) 12)  Depression  (ICD-311) 13)  Gerd  (ICD-530.81) 14)  Wheezing  (ICD-786.07) 15)   Colonic Polyps, Hx of  (ICD-V12.72) 16)  Preventive Health Care  (ICD-V70.0) 17)  Sinusitis- Acute-nos  (ICD-461.9) 18)  Hiatal Hernia  (ICD-553.3) 19)  Diverticulosis, Colon  (ICD-562.10) 20)  Colonic Polyps  (ICD-211.3) 21)  Psoriasis, Hx of  (ICD-V13.3) 22)  Osteoarthritis  (ICD-715.90) 23)  Allergic Rhinitis  (ICD-477.9) 24)  Erectile Dysfunction  (ICD-302.72)  Medications Prior to Update: 1)  Plavix 75 Mg  Tabs (Clopidogrel Bisulfate) .... Take 1 By Mouth Once Daily 2)  Pravastatin Sodium 40 Mg Tabs (Pravastatin Sodium) .[name].. 1 Tab By Mouth Daily 3)  Nexium 40 Mg Cpdr (Esomeprazole Magnesium) .[name].. 1po Once Daily 4)  Lisinopril 5 Mg Tabs (Lisinopril) .[name].. 1 Tab By Mouth Daily 5)  Levitra 20 Mg Tabs (Vardenafil Hcl) .[name].. 1 By Mouth As Needed 6)  Hydrocodone-Acetaminophen 7.5-325 Mg Tabs (Hydrocodone-Acetaminophen) .[name].. 1 By Mouth Q 6 Hrs As Needed  Current Medications (verified): 1)  Plavix 75 Mg  Tabs (Clopidogrel Bisulfate) .... Take 1 By Mouth Once Daily 2)  Pravastatin Sodium 40 Mg Tabs (Pravastatin Sodium) .[name].. 1 Tab By Mouth Daily 3)  Nexium 40 Mg Cpdr (Esomeprazole Magnesium) .[name].. 1po Once Daily 4)  Lisinopril 5 Mg Tabs (Lisinopril) .[name].. 1 Tab By Mouth Daily 5)  Levitra 20 Mg Tabs (Vardenafil Hcl) .[name].. 1 By Mouth As Needed 6)  Hydrocodone-Acetaminophen 7.5-325 Mg Tabs (Hydrocodone-Acetaminophen) .Marland KitchenMarland KitchenMarland Kitchen  1 By Mouth Q 6 Hrs As Needed  Allergies (verified): 1)  ! Lipitor 2)  ! Zocor  Past History:  Past Medical History: Last updated: 03/25/2009 Current Problems:  CORONARY ARTERY DISEASE (ICD-414.00) TRANSIENT ISCHEMIC ATTACK, HX OF (ICD-V12.50) CAROTID ARTERY DISEASE (ICD-433.10) HYPERTENSION (ICD-401.9) BRADYCARDIA (ICD-427.89) HYPERLIPIDEMIA (ICD-272.4) OBESITY (ICD-278.00) ANXIETY (ICD-300.00) DEPRESSION (ICD-311) GERD (ICD-530.81) WHEEZING (ICD-786.07) COLONIC POLYPS, HX OF (ICD-V12.72) PREVENTIVE HEALTH CARE (ICD-V70.0) SINUSITIS- ACUTE-NOS (ICD-461.9) HIATAL  HERNIA (ICD-553.3) DIVERTICULOSIS, COLON (ICD-562.10) COLONIC POLYPS (ICD-211.3) PSORIASIS, HX OF (ICD-V13.3) OSTEOARTHRITIS (ICD-715.90) ALLERGIC RHINITIS (ICD-477.9) ERECTILE DYSFUNCTION (ICD-302.72)  Past Surgical History: Last updated: 03/31/2009 Lung Biopsy Inguinal herniorrhaphy PTCA/stent-1998 s/p lipoma right thigh CABG  Dec 2006 times 2--LIMA to LAD, SVG to OM  Family History: Last updated: 03/25/2009 Family history of HTN  Social History: Last updated: 06/24/2009 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         all otherwise negative per pt -  except for rare "flutters" in the chest every 2 mo wihtout assoc symptoms suc as CP or sob, lasting 30 sec ; bicep tendonitis now improved   Physical Exam  General:  alert and overweight-appearing.   Head:  normocephalic and atraumatic.   Eyes:  vision grossly intact, pupils equal, and pupils round.   Ears:  lfet tm mild erythema, right tm ok Nose:  no external deformity and no nasal discharge.   Mouth:  no gingival abnormalities and pharyngeal erythema.   Neck:  supple and no masses.   Lungs:  normal respiratory effort and normal breath sounds.   Heart:  normal rate and regular rhythm.   Abdomen:  soft, non-tender, and normal bowel sounds.   Msk:  no joint tenderness and no joint swelling.   Extremities:  no edema, no erythema  Neurologic:  cranial nerves II-XII intact and strength normal in all extremities.     Skin:  color normal and no rashes.   Psych:  not depressed appearing and slightly anxious.     Impression & Recommendations:  Problem # 1:  Preventive Health Care (ICD-V70.0) Overall doing well, age appropriate education and counseling updated, referral for preventive services and immunizations addressed, dietary counseling and smoking status adressed , most recent labs reviewed, ecg reviewed I have personally reviewed and have noted 1.The patient's medical and social history 2.Their use of alcohol, tobacco or illicit drugs 3.Their current medications and supplements 4. Functional ability including ADL's, fall risk, home safety risk, hearing & visual impairment  5.Diet and physical activities 6.Evidence for depression or mood disorders The patients weight, height, BMI  have been recorded in the chart I have made referrals, counseling and provided education to the patient based review of the above  Orders: EKG w/ Interpretation (93000)  Problem # 2:  HYPERTENSION (ICD-401.9)  His updated medication list for this problem includes:    Lisinopril 5 Mg Tabs (Lisinopril) .[name]... 1 tab by mouth daily  BP today: 142/70 Prior BP: 132/70 (09/23/2009)  Labs Reviewed: K+: 4.5 (12/05/2009) Creat: : 1.1 (12/05/2009)   Chol: 156 (12/05/2009)   HDL: 39.50 (12/05/2009)   LDL: 84 (12/05/2009)   TG: 165.0 (12/05/2009) stable overall  by hx and exam, ok to continue meds/tx as is   Problem # 3:  OSTEOARTHRITIS (ICD-715.90)  His updated medication list for this problem includes:    Hydrocodone-acetaminophen 7.5-325 Mg Tabs (Hydrocodone-acetaminophen) .[name]... 1 by mouth q 6 hrs as needed limited rx to cont as above, but should try tylenol arthriits as needed   Complete Medication List: 1)  Plavix 75 Mg Tabs (Clopidogrel bisulfate) .... Take 1 by mouth once daily 2)  Pravastatin Sodium 40 Mg Tabs (Pravastatin sodium) .[name].. 1 tab by mouth daily 3)  Nexium 40 Mg Cpdr (Esomeprazole magnesium) .[name].. 1po  once daily 4)  Lisinopril 5 Mg Tabs (Lisinopril) .[name].. 1 tab by mouth daily 5)  Levitra 20 Mg Tabs (Vardenafil hcl) .[name].. 1 by mouth as needed 6)  Hydrocodone-acetaminophen 7.5-325 Mg Tabs (Hydrocodone-acetaminophen) .[name].. 1 by mouth q 6 hrs as needed  Other Orders: Pneumococcal Vaccine (16109) Admin 1st Vaccine (60454)  Patient Instructions: 1)  you had the pneumonia shot today 2)  you can use tylenol arthritis (or its generic) at 1-2 by mouth as needed for pain 3)  remember the levitra is $9 per pill at [facility] and [facility] 4)  Continue all previous medications as before this visit  5)  Please schedule a follow-up appointment in 6 months with: 6)  Lipid Panel prior to visit, ICD-9: 272.0 7)  Hepatic Panel prior to visit, ICD-9: v58.69 Prescriptions: NEXIUM 40 MG CPDR (ESOMEPRAZOLE MAGNESIUM) 1po once daily  #90 x 3   Entered and Authorized by:   Corwin Levins MD   Signed by:   Corwin Levins MD on 12/08/2009   Method used:   Print then Give to Patient   RxID:   [id number] LEVITRA 20 MG TABS (VARDENAFIL HCL) 1 by mouth as needed  #5 x 11   Entered and Authorized by:   Corwin Levins MD   Signed by:   Corwin Levins MD on 12/08/2009   Method used:   Print then Give to Patient   RxID:   [id number]    Orders Added: 1)  EKG w/ Interpretation [93000] 2)  Pneumococcal Vaccine [41324] 3)  Admin 1st Vaccine [90471] 4)  Est. Patient 65& > [40102]   Immunizations Administered:  Pneumonia Vaccine:    Vaccine Type: Pneumovax    Site: right deltoid    Mfr: Merck    Dose: 0.5 ml    Route: IM    Given by: Zella Ball Ewing CMA (AAMA)    Exp. Date: 05/05/2011    Lot #: [id number]    VIS given: 12/13/08 version given December 08, 2009.   Immunizations Administered:  Pneumonia Vaccine:    Vaccine Type: Pneumovax    Site: right deltoid    Mfr: Merck    Dose: 0.5 ml    Route: IM    Given by: Zella Ball Ewing CMA (AAMA)    Exp. Date: 05/05/2011    Lot #: [id number]    VIS given: 12/13/08  version given December 08, 2009.

## 2010-02-07 NOTE — Assessment & Plan Note (Signed)
Summary: BRONCHITIS? /NWS   Vital Signs:  Patient profile:   65 year old male Height:      64 inches Weight:      167.75 pounds O2 Sat:      97 % on Room air Temp:     97.8 degrees F oral Pulse rate:   56 / minute BP sitting:   128 / 80  (left arm) Cuff size:   regular  Vitals Entered By: Margaret Pyle, CMA (June 24, 2009 4:44 PM)  O2 Flow:  Room air  CC: Productive cough x 2 weeks   Primary Care Provider:  Corwin Levins MD  CC:  Productive cough x 2 weeks.  History of Present Illness: here with 3 days onset mild to mod fever, ST, prod cough with greenish sputum,  but Pt denies CP, sob, doe, wheezing, orthopnea, pnd, worsening LE edema, palps, dizziness or syncope  Pt denies new neuro symptoms such as headache, facial or extremity weakness Die have significant flare of reflux however associated for several days and took two times a day nexium, and asks for several refills to make up for it;  but no dysphagia, n/v, abd pain, bowel change, wt loss or blood.  Did also incidently have bilat heel adn foot pain over the past wk with burning pain but no trauma, injury, swelling, erythema or known nerve damage.  Pain is worse at the end of the day after walking and standing all day, somewhat better with shoe inserts recently but has hard soled shoes.  Also asks for refill levitra - works well , but [employer] getting ready to run out when he turns 64 and asks for 3 mo rx prior.     Preventive Screening-Counseling & Management      Drug Use:  no.    Problems Prior to Update: 1)  Bronchitis-acute  (ICD-466.0) 2)  Cad, Autologous Bypass Graft  (ICD-414.02) 3)  Coronary Artery Disease  (ICD-414.00) 4)  Transient Ischemic Attack, Hx of  (ICD-V12.50) 5)  Carotid Artery Disease  (ICD-433.10) 6)  Hypertension  (ICD-401.9) 7)  Bradycardia  (ICD-427.89) 8)  Hyperlipidemia  (ICD-272.4) 9)  Obesity  (ICD-278.00) 10)  Anxiety  (ICD-300.00) 11)  Depression  (ICD-311) 12)  Gerd   (ICD-530.81) 13)  Wheezing  (ICD-786.07) 14)  Colonic Polyps, Hx of  (ICD-V12.72) 15)  Preventive Health Care  (ICD-V70.0) 16)  Sinusitis- Acute-nos  (ICD-461.9) 17)  Hiatal Hernia  (ICD-553.3) 18)  Diverticulosis, Colon  (ICD-562.10) 19)  Colonic Polyps  (ICD-211.3) 20)  Psoriasis, Hx of  (ICD-V13.3) 21)  Osteoarthritis  (ICD-715.90) 22)  Allergic Rhinitis  (ICD-477.9) 23)  Erectile Dysfunction  (ICD-302.72)  Medications Prior to Update: 1)  Plavix 75 Mg  Tabs (Clopidogrel Bisulfate) .... Take 1 By Mouth Once Daily 2)  Pravastatin Sodium 40 Mg Tabs (Pravastatin Sodium) .[name].. 1 Tab By Mouth Daily 3)  Nexium 40 Mg Cpdr (Esomeprazole Magnesium) .[name].. 1po Once Daily 4)  Lisinopril 5 Mg Tabs (Lisinopril) .[name].. 1 Tab By Mouth Daily 5)  Levitra 20 Mg Tabs (Vardenafil Hcl) .[name].. 1 By Mouth As Needed  Current Medications (verified): 1)  Plavix 75 Mg  Tabs (Clopidogrel Bisulfate) .... Take 1 By Mouth Once Daily 2)  Pravastatin Sodium 40 Mg Tabs (Pravastatin Sodium) .[name].. 1 Tab By Mouth Daily 3)  Nexium 40 Mg Cpdr (Esomeprazole Magnesium) .[name].. 1po Once Daily 4)  Lisinopril 5 Mg Tabs (Lisinopril) .[name].. 1 Tab By Mouth Daily 5)  Levitra 20 Mg Tabs (Vardenafil Hcl) .Marland KitchenMarland KitchenMarland Kitchen  1 By Mouth As Needed 6)  Azithromycin 250 Mg Tabs (Azithromycin) .... 2po Qd For 1 Day, Then 1po Qd For 4days, Then Stop 7)  Promethazine-Codeine 6.25-10 Mg/37ml Syrp (Promethazine-Codeine) .[name].. 1 Tsp By Mouth Q 6 Hrs As Needed  Allergies (verified): 1)  ! Lipitor 2)  ! Zocor  Past History:  Past Medical History: Last updated: 03/25/2009 Current Problems:  CORONARY ARTERY DISEASE (ICD-414.00) TRANSIENT ISCHEMIC ATTACK, HX OF (ICD-V12.50) CAROTID ARTERY DISEASE (ICD-433.10) HYPERTENSION (ICD-401.9) BRADYCARDIA (ICD-427.89) HYPERLIPIDEMIA (ICD-272.4) OBESITY (ICD-278.00) ANXIETY (ICD-300.00) DEPRESSION (ICD-311) GERD (ICD-530.81) WHEEZING (ICD-786.07) COLONIC POLYPS, HX OF (ICD-V12.72) PREVENTIVE HEALTH CARE  (ICD-V70.0) SINUSITIS- ACUTE-NOS (ICD-461.9) HIATAL HERNIA (ICD-553.3) DIVERTICULOSIS, COLON (ICD-562.10) COLONIC POLYPS (ICD-211.3) PSORIASIS, HX OF (ICD-V13.3) OSTEOARTHRITIS (ICD-715.90) ALLERGIC RHINITIS (ICD-477.9) ERECTILE DYSFUNCTION (ICD-302.72)  Past Surgical History: Last updated: 03/31/2009 Lung Biopsy Inguinal herniorrhaphy PTCA/stent-1998 s/p lipoma right thigh CABG  Dec 2006 times 2--LIMA to LAD, SVG to OM  Social History: Last updated: 06/24/2009 Married Occupation: pastor Former Smoker Alcohol use-no 3 children Drug use-no  Risk Factors: Smoking Status: quit (11/25/2007)  Social History: Reviewed history from 03/25/2009 and no changes required. Married Occupation: Education officer, environmental Former Smoker Alcohol use-no 3 children Drug use-no Drug Use:  no  Review of Systems       all otherwise negative per pt -    Physical Exam  General:  alert and overweight-appearing.  , mild ill  Head:  normocephalic and atraumatic.   Eyes:  vision grossly intact, pupils equal, and pupils round.   Ears:  left tm mod erythema, right tm ok, canals clear Nose:  nasal dischargemucosal pallor and mucosal edema.   Mouth:  pharyngeal erythema and fair dentition.   Neck:  supple and no masses.   Lungs:  normal respiratory effort and normal breath sounds.   Heart:  normal rate and regular rhythm.   Abdomen:  soft, non-tender, and normal bowel sounds.   Extremities:  no edema, no erythema    Impression & Recommendations:  Problem # 1:  BRONCHITIS-ACUTE (ICD-466.0)  His updated medication list for this problem includes:    Azithromycin 250 Mg Tabs (Azithromycin) .[name]... 2po qd for 1 day, then 1po qd for 4days, then stop    Promethazine-codeine 6.25-10 Mg/37ml Syrp (Promethazine-codeine) .[name]... 1 tsp by mouth q 6 hrs as needed treat as above, f/u any worsening signs or symptoms   Problem # 2:  GERD (ICD-530.81)  His updated medication list for this problem includes:    Nexium 40  Mg Cpdr (Esomeprazole magnesium) .[name]... 1po once daily treat as above, f/u any worsening signs or symptoms  - stable overall by hx and exam, ok to continue meds/tx as is   Problem # 3:  ERECTILE DYSFUNCTION (ICD-302.72)  His updated medication list for this problem includes:    Levitra 20 Mg Tabs (Vardenafil hcl) .[name]... 1 by mouth as needed treat as above, f/u any worsening signs or symptoms , stable overall by hx and exam, ok to continue meds/tx as is   Problem # 4:  HYPERTENSION (ICD-401.9)  His updated medication list for this problem includes:    Lisinopril 5 Mg Tabs (Lisinopril) .[name]... 1 tab by mouth daily  BP today: 128/80 Prior BP: 144/72 (03/31/2009)  Labs Reviewed: K+: 4.5 (01/05/2009) Creat: : 1.1 (01/05/2009)   Chol: 155 (01/05/2009)   HDL: 47.90 (01/05/2009)   LDL: 86 (01/05/2009)   TG: 104.0 (01/05/2009) stable overall by hx and exam, ok to continue meds/tx as is   Complete Medication List: 1)  Plavix  75 Mg Tabs (Clopidogrel bisulfate) .... Take 1 by mouth once daily 2)  Pravastatin Sodium 40 Mg Tabs (Pravastatin sodium) .[name].. 1 tab by mouth daily 3)  Nexium 40 Mg Cpdr (Esomeprazole magnesium) .[name].. 1po once daily 4)  Lisinopril 5 Mg Tabs (Lisinopril) .[name].. 1 tab by mouth daily 5)  Levitra 20 Mg Tabs (Vardenafil hcl) .[name].. 1 by mouth as needed 6)  Azithromycin 250 Mg Tabs (Azithromycin) .... 2po qd for 1 day, then 1po qd for 4days, then stop 7)  Promethazine-codeine 6.25-10 Mg/32ml Syrp (Promethazine-codeine) .[name].. 1 tsp by mouth q 6 hrs as needed  Patient Instructions: 1)  Please take all new medications as prescribed 2)  Continue all previous medications as before this visit  3)  Please schedule a follow-up appointment in Dec 2011 for "yearly medicare exam" Prescriptions: PROMETHAZINE-CODEINE 6.25-10 MG/5ML SYRP (PROMETHAZINE-CODEINE) 1 tsp by mouth q 6 hrs as needed  #6oz x 1   Entered and Authorized by:   Corwin Levins MD   Signed by:   Corwin Levins MD on 06/24/2009    Method used:   Print then Give to Patient   RxID:   [id number] AZITHROMYCIN 250 MG TABS (AZITHROMYCIN) 2po qd for 1 day, then 1po qd for 4days, then stop  #6 x 1   Entered and Authorized by:   Corwin Levins MD   Signed by:   Corwin Levins MD on 06/24/2009   Method used:   Print then Give to Patient   RxID:   [id number] LEVITRA 20 MG TABS (VARDENAFIL HCL) 1 by mouth as needed  #30 x 3   Entered and Authorized by:   Corwin Levins MD   Signed by:   Corwin Levins MD on 06/24/2009   Method used:   Print then Give to Patient   RxID:   [id number] LEVITRA 20 MG TABS (VARDENAFIL HCL) 1 by mouth as needed  #30 x 3   Entered and Authorized by:   Corwin Levins MD   Signed by:   Corwin Levins MD on 06/24/2009   Method used:   Print then Give to Patient   RxID:   [id number]

## 2010-02-09 NOTE — Progress Notes (Signed)
Summary: Rx refill req?  Phone Note Call from Patient   Caller: Patient [phone] Summary of Call: Pt called requesting rx refill of Nitroglycerin. Pt states he last had Rx filled 08/2006 and refill have been expired, please advise Initial call taken by: Margaret Pyle, CMA,  February 01, 2010 10:57 AM  Follow-up for Phone Call        ok - to robin to handle Follow-up by: Corwin Levins MD,  February 01, 2010 1:06 PM    New/Updated Medications: NITROSTAT 0.4 MG SUBL (NITROGLYCERIN) use as directed as needed Prescriptions: NITROSTAT 0.4 MG SUBL (NITROGLYCERIN) use as directed as needed  #30 x 3   Entered by:   Zella Ball Ewing CMA (AAMA)   Authorized by:   Corwin Levins MD   Signed by:   Scharlene Gloss CMA (AAMA) on 02/01/2010   Method used:   Faxed to ...       [facility]  [phone]* (retail)       [address]       [place], [place]  [address]       Ph: [phone]       Fax: [phone]   RxID:   [id number]

## 2010-03-06 ENCOUNTER — Encounter: Payer: Self-pay | Admitting: Cardiology

## 2010-03-06 ENCOUNTER — Other Ambulatory Visit: Payer: Self-pay | Admitting: Cardiology

## 2010-03-06 ENCOUNTER — Ambulatory Visit (INDEPENDENT_AMBULATORY_CARE_PROVIDER_SITE_OTHER): Payer: Medicare Other | Admitting: Cardiology

## 2010-03-06 DIAGNOSIS — I209 Angina pectoris, unspecified: Secondary | ICD-10-CM

## 2010-03-06 DIAGNOSIS — E785 Hyperlipidemia, unspecified: Secondary | ICD-10-CM

## 2010-03-06 DIAGNOSIS — I2510 Atherosclerotic heart disease of native coronary artery without angina pectoris: Secondary | ICD-10-CM

## 2010-03-06 DIAGNOSIS — I10 Essential (primary) hypertension: Secondary | ICD-10-CM

## 2010-03-06 DIAGNOSIS — E7800 Pure hypercholesterolemia, unspecified: Secondary | ICD-10-CM

## 2010-03-06 LAB — CBC WITH DIFFERENTIAL/PLATELET
Basophils Absolute: 0.1 10*3/uL (ref 0.0–0.1)
Basophils Relative: 0.6 % (ref 0.0–3.0)
Eosinophils Absolute: 0.5 10*3/uL (ref 0.0–0.7)
MCHC: 34.2 g/dL (ref 30.0–36.0)
MCV: 92.2 fl (ref 78.0–100.0)
Monocytes Absolute: 0.6 10*3/uL (ref 0.1–1.0)
Neutrophils Relative %: 68.8 % (ref 43.0–77.0)
Platelets: 261 10*3/uL (ref 150.0–400.0)
RDW: 14.8 % — ABNORMAL HIGH (ref 11.5–14.6)

## 2010-03-06 LAB — BASIC METABOLIC PANEL
BUN: 13 mg/dL (ref 6–23)
CO2: 29 mEq/L (ref 19–32)
Calcium: 9.9 mg/dL (ref 8.4–10.5)
Chloride: 110 mEq/L (ref 96–112)
Creatinine, Ser: 1.1 mg/dL (ref 0.4–1.5)

## 2010-03-06 LAB — PROTIME-INR
INR: 1 ratio (ref 0.8–1.0)
Prothrombin Time: 10.8 s (ref 10.2–12.4)

## 2010-03-08 ENCOUNTER — Ambulatory Visit (HOSPITAL_COMMUNITY): Payer: Medicare Other

## 2010-03-08 ENCOUNTER — Ambulatory Visit (HOSPITAL_COMMUNITY)
Admission: RE | Admit: 2010-03-08 | Discharge: 2010-03-08 | Disposition: A | Discharge status: Home / Self Care | Payer: Medicare Other | Source: Ambulatory Visit | Attending: Cardiology | Admitting: Cardiology

## 2010-03-08 DIAGNOSIS — I2510 Atherosclerotic heart disease of native coronary artery without angina pectoris: Secondary | ICD-10-CM | POA: Insufficient documentation

## 2010-03-08 DIAGNOSIS — Z951 Presence of aortocoronary bypass graft: Secondary | ICD-10-CM | POA: Insufficient documentation

## 2010-03-08 DIAGNOSIS — R079 Chest pain, unspecified: Secondary | ICD-10-CM | POA: Insufficient documentation

## 2010-03-16 ENCOUNTER — Encounter: Payer: Self-pay | Admitting: Cardiology

## 2010-03-16 ENCOUNTER — Encounter (INDEPENDENT_AMBULATORY_CARE_PROVIDER_SITE_OTHER): Payer: Self-pay | Admitting: *Deleted

## 2010-03-16 ENCOUNTER — Encounter (INDEPENDENT_AMBULATORY_CARE_PROVIDER_SITE_OTHER): Payer: Medicare Other | Admitting: Cardiology

## 2010-03-16 DIAGNOSIS — E7800 Pure hypercholesterolemia, unspecified: Secondary | ICD-10-CM

## 2010-03-16 DIAGNOSIS — I2510 Atherosclerotic heart disease of native coronary artery without angina pectoris: Secondary | ICD-10-CM

## 2010-03-16 NOTE — Letter (Signed)
Summary: Cardiac Catheterization Instructions- Main Lab  [facility], Main Office  [address]   [place], [place] [address]   Phone: [phone]  Fax: [phone]     03/06/2010 MRN: [id number]  Brycen Sanford [address] [place], [place]  [address]  [place]  Dear Mr. Schwandt,   You are scheduled for Cardiac Catheterization on March 08, 2010              with Dr. Riley Kill.  Please arrive at the [facility] at 9:30      a.m. on the day of your procedure.  1. DIET     _X___ Nothing to eat or drink after midnight except your medications with a sip of water.  2. MAKE SURE YOU TAKE YOUR ASPIRIN AND PLAVIX.  3. _X___ YOU MAY TAKE ALL of your remaining medications with a small amount of water.       4. Plan for one night stay - bring personal belongings (i.e. toothpaste, toothbrush, etc.)  5. Bring a current list of your medications and current insurance cards.  6. Must have a responsible person to drive you home.   7. Someone must be with you for the first 24 hours after you arrive home.  8. Please wear clothes that are easy to get on and off and wear slip-on shoes.  *Special note: Every effort is made to have your procedure done on time.  Occasionally there are emergencies that present themselves at the hospital that may cause delays.  Please be patient if a delay does occur.  If you have any questions after you get home, please call the office at the number listed above.  Julieta Gutting, RN, BSN

## 2010-03-16 NOTE — Assessment & Plan Note (Signed)
Summary: f1y    Visit Type:  Follow-up Primary Provider:  Corwin Levins MD  CC:  Chest discomfort.  History of Present Illness: OPERATION:  Coronary artery bypass grafting x2 utilizing left internal  mammary artery graft to the LAD and a endoscopically harvested saphenous  vein graft to the distal circumflex with placement of the On-Q wound  catheter system.   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   SURGEON:  Kerin Perna, M.D.  Patient moved up his appointment becasue of his symptoms.  He feels he is having something similar to what he had before CABG.  Specifically, he has occasional to frequent chest discomfort mostly with activity.  He denies rest chest pain, and he has noted some onset of shortness of breath with activity.  He got them to move up his appointment.    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) 4)  Coronary Artery Disease  (ICD-414.00) 5)  Transient Ischemic Attack, Hx of  (ICD-V12.50) 6)  Carotid Artery Disease  (ICD-433.10) 7)  Hypertension  (ICD-401.9) 8)  Bradycardia  (ICD-427.89) 9)  Hyperlipidemia  (ICD-272.4) 10)  Obesity  (ICD-278.00) 11)  Anxiety  (ICD-300.00) 12)  Depression  (ICD-311) 13)  Gerd  (ICD-530.81) 14)  Wheezing  (ICD-786.07) 15)  Colonic Polyps, Hx of  (ICD-V12.72) 16)  Preventive Health Care  (ICD-V70.0) 17)  Sinusitis- Acute-nos  (ICD-461.9) 18)  Hiatal Hernia  (ICD-553.3) 19)  Diverticulosis, Colon  (ICD-562.10) 20)  Colonic Polyps  (ICD-211.3) 21)  Psoriasis, Hx of  (ICD-V13.3) 22)  Osteoarthritis  (ICD-715.90) 23)  Allergic Rhinitis  (ICD-477.9) 24)  Erectile Dysfunction  (ICD-302.72)  Current Medications (verified): 1)  Plavix 75 Mg  Tabs (Clopidogrel Bisulfate) .... Take 1 By Mouth Once Daily 2)  Pravastatin Sodium 40 Mg Tabs (Pravastatin Sodium) .[name].. 1 Tab By  Mouth Daily 3)  Nexium 40 Mg Cpdr (Esomeprazole Magnesium) .[name].. 1po Once Daily 4)  Lisinopril 5 Mg Tabs (Lisinopril) .[name].. 1 Tab By Mouth Daily 5)  Levitra 20 Mg Tabs (Vardenafil Hcl) .[name].. 1 By Mouth As Needed 6)  Nitrostat 0.4 Mg Subl (Nitroglycerin) .... Use As Directed As Needed 7)  Aspirin 81 Mg Tbec (Aspirin) .... Take One Tablet By Mouth Daily 8)  Vitamin E 400 Unit Caps (Vitamin E) .... Take 1 Capsule By Mouth Once A Day 9)  Vitamin B Complex  Tabs (B Complex Vitamins) .... Take 1 Tablet By Mouth Once A Day 10)  Fish Oil 1000 Mg Caps (Omega-3 Fatty Acids) .[name].. 1-2-Daily  Allergies: 1)  ! Lipitor 2)  ! Zocor  Past History:  Past Medical History: Last updated: 03/25/2009 Current Problems:  CORONARY ARTERY DISEASE (ICD-414.00) TRANSIENT ISCHEMIC ATTACK, HX OF (ICD-V12.50) CAROTID ARTERY DISEASE (ICD-433.10) HYPERTENSION (ICD-401.9) BRADYCARDIA (ICD-427.89) HYPERLIPIDEMIA (ICD-272.4) OBESITY (ICD-278.00) ANXIETY (ICD-300.00) DEPRESSION (ICD-311) GERD (ICD-530.81) WHEEZING (ICD-786.07) COLONIC POLYPS, HX OF (ICD-V12.72) PREVENTIVE HEALTH CARE (ICD-V70.0) SINUSITIS- ACUTE-NOS (ICD-461.9) HIATAL HERNIA (ICD-553.3) DIVERTICULOSIS, COLON (ICD-562.10) COLONIC POLYPS (ICD-211.3) PSORIASIS, HX OF (ICD-V13.3) OSTEOARTHRITIS (ICD-715.90) ALLERGIC RHINITIS (ICD-477.9) ERECTILE DYSFUNCTION (ICD-302.72)  Past Surgical History: Last updated: 03/31/2009 Lung Biopsy Inguinal herniorrhaphy PTCA/stent-1998 s/p lipoma right thigh CABG  Dec 2006 times 2--LIMA to LAD, SVG to OM  Family History: Last updated: 03/25/2009 Family history of HTN  Social History: Last updated: 06/24/2009 Married Occupation: pastor Former Smoker Alcohol use-no 3 children Drug use-no  Vital Signs:  Patient  profile:   65 year old male Height:      65 inches Weight:      167.50 pounds BMI:     27.97 Pulse rate:   54 / minute Pulse rhythm:   regular Resp:     18 per minute BP sitting:   124 /  64  (left arm) Cuff size:   large  Vitals Entered By: Vikki Ports (March 06, 2010 11:03 AM)  Physical Exam  General:  Well developed, well nourished, in no acute distress. Head:  normocephalic and atraumatic Eyes:  PERRLA/EOM intact; conjunctiva and lids normal. Lungs:  Clear bilaterally to auscultation and percussion. Heart:  PMI non displaced. Normal S 1 and S2.  No murmur,rub, or gallop.  Abdomen:  Bowel sounds positive; abdomen soft and non-tender without masses, organomegaly, or hernias noted. No hepatosplenomegaly. Pulses:  pulses normal in all 4 extremities Extremities:  No clubbing or cyanosis. Neurologic:  Alert and oriented x 3.   EKG  Procedure date:  03/06/2010  Findings:      NSR.  Nonspecific ST and T abnormality.   Cardiac Cath  Procedure date:  12/24/2004  Findings:       ANGIOGRAPHIC DATA:  1.  The distal left main is slightly irregular with about 30% narrowing.  2.  The left anterior descending artery demonstrates about 40% narrowing and      there is a stent with segmental 30% narrowing throughout. Just after a      small diagonal branches is an 80% focal area of narrowing in the left      anterior descending artery. The distal LAD wraps the apex.  3.  The circumflex is a dominant vessel. There is an 80-90% area of      segmental plaquing overlying the origin of the first marginal. The first      marginal itself has about 60%. This leads into a dominant circumflex      system. There is mild luminal irregularity throughout the circumflex      with two large posterolateral vessels. The second one being a posterior      descending branch. They are both very suitable for grafting, right      coronary artery is nondominant, free of critical disease.   VENTRICULOGRAPHY:  Demonstrates well-preserved left ventricular function. No  segmental abnormalities contraction were identified. There is no obvious  evidence of aortic aneurysm in the thoracic  distribution.   CONCLUSION:  1.  Preserved left ventricular function.  2.  No evidence of high-grade restenosis at the previous stent site from      1998.  3.  Progressive disease in the left anterior descending artery after the      stent location.  4.  High-grade stenosis of the dominant circumflex overlapping the origin of      first marginal.  Impression & Recommendations:  Problem # 1:  CAD, AUTOLOGOUS BYPASS GRAFT (ICD-414.02) Recurrent symptoms post CABG that are worrisome for ischemia at low threshold.  recommmend repeat cardiac cath, which patient does prefer.  He is agreeable to proceeding with procedure.   His updated medication list for this problem includes:    Plavix 75 Mg Tabs (Clopidogrel bisulfate) .[name]... Take 1 by mouth once daily    Lisinopril 5 Mg Tabs (Lisinopril) .[name]... 1 tab by mouth daily    Nitrostat 0.4 Mg Subl (Nitroglycerin) ..... Use as directed as needed    Aspirin 81 Mg Tbec (Aspirin) .[name]... Take one tablet by mouth daily  Orders: Cardiac Catheterization (Cardiac Cath) EKG w/ Interpretation (93000) TLB-BMP (Basic Metabolic Panel-BMET) (80048-METABOL) TLB-CBC Platelet - w/Differential (85025-CBCD) TLB-PT (Protime) (85610-PTP)  Problem # 2:  TRANSIENT ISCHEMIC ATTACK, HX OF (ICD-V12.50) remote  Problem # 3:  HYPERLIPIDEMIA (ICD-272.4) near target.   His updated medication list for this problem includes:    Pravastatin Sodium 40 Mg Tabs (Pravastatin sodium) .[name]... 1 tab by mouth daily  Orders: Cardiac Catheterization (Cardiac Cath) EKG w/ Interpretation (93000) TLB-BMP (Basic Metabolic Panel-BMET) (80048-METABOL) TLB-CBC Platelet - w/Differential (85025-CBCD) TLB-PT (Protime) (85610-PTP)  Problem # 4:  HYPERTENSION (ICD-401.9) under cotnrol.  His updated medication list for this problem includes:    Lisinopril 5 Mg Tabs (Lisinopril) .[name]... 1 tab by mouth daily    Aspirin 81 Mg Tbec (Aspirin) .[name]... Take one tablet by mouth daily  Orders: Cardiac  Catheterization (Cardiac Cath) EKG w/ Interpretation (93000) TLB-BMP (Basic Metabolic Panel-BMET) (80048-METABOL) TLB-CBC Platelet - w/Differential (85025-CBCD) TLB-PT (Protime) (85610-PTP)  Patient Instructions: 1)  Your physician recommends that you continue on your current medications as directed. Please refer to the Current Medication list given to you today. 2)  Your physician has requested that you have a cardiac catheterization.  Cardiac catheterization is used to diagnose and/or treat various heart conditions. Doctors may recommend this procedure for a number of different reasons. The most common reason is to evaluate chest pain. Chest pain can be a symptom of coronary artery disease (CAD), and cardiac catheterization can show whether plaque is narrowing or blocking your heart's arteries. This procedure is also used to evaluate the valves, as well as measure the blood flow and oxygen levels in different parts of your heart.  For further information please visit [email or web address]  Please follow instruction sheet, as given.

## 2010-03-21 NOTE — Letter (Signed)
Summary: [facility] Physician's Orders  [facility] Physician's Orders   Imported By: Earl Many 03/16/2010 17:01:13  _____________________________________________________________________  External Attachment:    Type:   Image     Comment:   External Document

## 2010-03-21 NOTE — Letter (Signed)
Summary: New Patient letter  [facility]  [address] [place], [place] [address]   Phone: [phone]  Fax: [phone]       03/16/2010 MRN: [id number]  Brandon Rojas [address] [place], [place]  [address]  [place]  Dear Mr. Weinhold,  Welcome to the Gastroenterology Division at [facility].    You are scheduled to see Dr.  Yancey Flemings on May 01, 2010 at 10:15am on the 3rd floor at [facility], [address]  We ask that you try to arrive at our office 15 minutes prior to your appointment time to allow for check-in.  We would like you to complete the enclosed self-administered evaluation form prior to your visit and bring it with you on the day of your appointment.  We will review it with you.  Also, please bring a complete list of all your medications or, if you prefer, bring the medication bottles and we will list them.  Please bring your insurance card so that we may make a copy of it.  If your insurance requires a referral to see a specialist, please bring your referral form from your primary care physician.  Co-payments are due at the time of your visit and may be paid by cash, check or credit card.     Your office visit will consist of a consult with your physician (includes a physical exam), any laboratory testing he/she may order, scheduling of any necessary diagnostic testing (e.g. x-ray, ultrasound, CT-scan), and scheduling of a procedure (e.g. Endoscopy, Colonoscopy) if required.  Please allow enough time on your schedule to allow for any/all of these possibilities.    If you cannot keep your appointment, please call [phone] to cancel or reschedule prior to your appointment date.  This allows [place] the opportunity to schedule an appointment for another patient in need of care.  If you do not cancel or reschedule by 5 p.m. the business day prior to your appointment date, you will be charged a $50.00 late cancellation/no-show fee.    Thank you for  choosing [facility] for your medical needs.  We appreciate the opportunity to care for you.  Please visit [place] at our website  to learn more about our practice.                     Sincerely,                                                             The Gastroenterology Division

## 2010-04-04 ENCOUNTER — Ambulatory Visit: Payer: Self-pay | Admitting: Cardiology

## 2010-04-06 NOTE — Assessment & Plan Note (Signed)
Summary: post cath   Primary Provider:  Corwin Levins MD  CC:  follow up cath.  History of Present Illness: I had the Brandon Rojas return today so that we could review his cath films in detail. He seems somewhat better, although still has some touch of discomfort.  We revieved the findings and have recommended that he stick with continued medical therapy at this point in time.  He did not have any cath complications.   Current Medications (verified): 1)  Plavix 75 Mg  Tabs (Clopidogrel Bisulfate) .... Take 1 By Mouth Once Daily 2)  Pravastatin Sodium 40 Mg Tabs (Pravastatin Sodium) .[name].. 1 Tab By Mouth Daily 3)  Nexium 40 Mg Cpdr (Esomeprazole Magnesium) .[name].. 1po Once Daily 4)  Lisinopril 5 Mg Tabs (Lisinopril) .[name].. 1 Tab By Mouth Daily 5)  Levitra 20 Mg Tabs (Vardenafil Hcl) .[name].. 1 By Mouth As Needed 6)  Nitrostat 0.4 Mg Subl (Nitroglycerin) .... Use As Directed As Needed 7)  Aspirin 81 Mg Tbec (Aspirin) .... Take One Tablet By Mouth Daily 8)  Vitamin E 400 Unit Caps (Vitamin E) .... Take 1 Capsule By Mouth Once A Day 9)  Vitamin B Complex  Tabs (B Complex Vitamins) .... Take 1 Tablet By Mouth Once A Day 10)  Fish Oil 1000 Mg Caps (Omega-3 Fatty Acids) .[name].. 1-2-Daily  Allergies: 1)  ! Lipitor 2)  ! Zocor  Past History:  Past Medical History: Last updated: 03/25/2009 Current Problems:  CORONARY ARTERY DISEASE (ICD-414.00) TRANSIENT ISCHEMIC ATTACK, HX OF (ICD-V12.50) CAROTID ARTERY DISEASE (ICD-433.10) HYPERTENSION (ICD-401.9) BRADYCARDIA (ICD-427.89) HYPERLIPIDEMIA (ICD-272.4) OBESITY (ICD-278.00) ANXIETY (ICD-300.00) DEPRESSION (ICD-311) GERD (ICD-530.81) WHEEZING (ICD-786.07) COLONIC POLYPS, HX OF (ICD-V12.72) PREVENTIVE HEALTH CARE (ICD-V70.0) SINUSITIS- ACUTE-NOS (ICD-461.9) HIATAL HERNIA (ICD-553.3) DIVERTICULOSIS, COLON (ICD-562.10) COLONIC POLYPS (ICD-211.3) PSORIASIS, HX OF (ICD-V13.3) OSTEOARTHRITIS (ICD-715.90) ALLERGIC RHINITIS (ICD-477.9) ERECTILE DYSFUNCTION  (ICD-302.72)  Past Surgical History: Last updated: 03/31/2009 Lung Biopsy Inguinal herniorrhaphy PTCA/stent-1998 s/p lipoma right thigh CABG  Dec 2006 times 2--LIMA to LAD, SVG to OM  Family History: Last updated: 03/25/2009 Family history of HTN  Social History: Last updated: 06/24/2009 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 Head:  normocephalic and atraumatic Eyes:  PERRLA/EOM intact; conjunctiva and lids normal. Chest Wall:  MS well healed Lungs:  Clear bilaterally to auscultation and percussion. Heart:  PMI non displaced. Normal S1 and S2.  No murmur or rub.  Pos S4. Extremities:  Groin is well healed.  Neurologic:  Alert and oriented x 3.   EKG  Procedure date:  03/16/2010  Findings:      SB.  Non specific T abnormality.  Cardiac Cath  Procedure date:  03/08/2010  Findings:      ANGIOGRAPHIC DATA: 1. The left main is free of critical disease. 2. The LAD is diffusely irregular proximally and diffusely plaqued.     Beyond this, there is a stent previously placed leading into a     smaller caliber diagonal with about 60% proximal narrowing.  The  stent has about 40% in-stent re-narrowing.  After the takeoff of     the small diagonal, there is an 80% stenosis and had evidence of     competitive filling distally. 3. The internal mammary to the distal LAD is widely patent, fills the     LAD retrograde. 4. The circumflex is a dominant vessel.  There is 80% narrowing prior     to the first tiny marginal branch.  The first marginal branch has     70% narrowing proximally  and is only about 1.4-mm caliber vessel.     The circumflex in this territory is somewhat diffusely diseased and     then opens back up and has about 70% narrowing distally. 5. The saphenous vein graft to the distal posterolateral branch is     widely patent without significant narrowing and retrograde fills     the posterolateral branch into the PDA. 6. The right coronary artery is small dominant vessel without critical     disease. 7. Ventriculography in the RAO projection reveals well-preserved     global systolic function.  CONCLUSION: 1. Well-preserved left ventricular function. 2. Continued patency of internal mammary to the LAD. 3. Continued patency of the saphenous vein graft to the posterolateral     branch. 4. Small-caliber disease involving the first diagonal and first     marginal that do not appear to be optimal for percutaneous     intervention.  DISPOSITION:  The exact etiology of these symptoms are unclear.  We will get a D-dimer.  I will also likely do some other studies.  We will see him back in the office in the next week.  CXR  Procedure date:  03/08/2010  Findings:       CHEST - 2 VIEW    Comparison: 12/30/2004    Findings: There is a small hiatal hernia.  Prior CABG.  Heart is   upper limits normal in size.  Scarring noted in the left base.   Right lung is clear.  No effusions.    IMPRESSION:   No active disease.    Small hiatal hernia.    Original Report Authenticated By: Cyndie Chime, M.D.   Impression & Recommendations:  Problem # 1:  CORONARY ARTERY DISEASE (ICD-414.00) Cath findings are mostly stable.  He has some residual disease that involves smaller branch vessels, but LIMA and SVG are intact.  Would favor continued medical therapy. His updated medication list for this problem includes:    Plavix 75 Mg Tabs (Clopidogrel bisulfate) .[name]... Take 1 by mouth once daily    Lisinopril 5 Mg Tabs (Lisinopril) .[name]... 1 tab by mouth daily     Nitrostat 0.4 Mg Subl (Nitroglycerin) ..... Use as directed as needed    Aspirin 81 Mg Tbec (Aspirin) .[name]... Take one tablet by mouth daily  Problem # 2:  HYPERLIPIDEMIA (ICD-272.4) currently not on a statin.  Problem # 3:  HYPERTENSION (ICD-401.9) currently well controlled on a medical regimen.   His updated medication list for this problem includes:    Lisinopril 5 Mg Tabs (Lisinopril) .[name]... 1 tab by mouth daily    Aspirin 81 Mg Tbec (Aspirin) .[name]... Take one tablet by mouth daily  Orders: EKG w/ Interpretation (93000)  Problem # 4:  HIATAL HERNIA (ICD-553.3) d-dimer was negative, and cath findings are as noted.  Would favor GI evaluation, and patient has seen Dr. Marina Goodell in the past.  His updated medication list for this problem includes:  Nexium 40 Mg Cpdr (Esomeprazole magnesium) .[name]... 1po once daily  Patient Instructions: 1)  Your physician has recommended you make the following change in your medication: STOP Pravastatin, START Red Yeast Rice follow instructions on bottle 2)  You have been referred to Dr Marina Goodell for routine follow-up. 3)  Your physician wants you to follow-up in: 4 MONTHS.   You will receive a reminder letter in the mail two months in advance. If you don't receive a letter, please call our office to schedule the follow-up appointment.

## 2010-04-20 NOTE — Procedures (Signed)
NAME:  Brandon Rojas, Brandon Rojas NO.:  [id number]  MEDICAL RECORD NO.:  [id number]           PATIENT TYPE:  O  LOCATION:  [facility]                         FACILITY:  [facility]  PHYSICIAN:  Arturo Morton. Riley Kill, MD, FACCDATE OF BIRTH:  1945-02-13  DATE OF PROCEDURE:  03/08/2010 DATE OF DISCHARGE:  03/08/2010                           CARDIAC CATHETERIZATION   INDICATIONS:  Mr. Brandon Rojas is well known to me.  He previously underwent revascularization surgery by Dr. Donata Clay.  He has had some symptoms recently, and he thinks it is similar to what he had previously, although it has been difficult to be sure.  The current study was done to assess his coronary anatomy.  PROCEDURES: 1. Left heart catheterization. 2. Selective coronary arteriography. 3. Selective left ventriculography. 4. Saphenous vein graft angiography. 5. Selective left internal mammary angiography.  DESCRIPTION OF PROCEDURE:  The procedure was performed from the right femoral artery using 5-French sheath and catheters.  He tolerated the procedure well and there were no major complications.  HEMODYNAMIC DATA: 1. The central aortic pressure was 153/70, mean 104. 2. LV pressure 154/12. 3. There was no gradient on pullback across the aortic valve.  ANGIOGRAPHIC DATA: 1. The left main is free of critical disease. 2. The LAD is diffusely irregular proximally and diffusely plaqued.     Beyond this, there is a stent previously placed leading into a     smaller caliber diagonal with about 60% proximal narrowing.  The     stent has about 40% in-stent re-narrowing.  After the takeoff of     the small diagonal, there is an 80% stenosis and had evidence of     competitive filling distally. 3. The internal mammary to the distal LAD is widely patent, fills the     LAD retrograde. 4. The circumflex is a dominant vessel.  There is 80% narrowing prior     to the first tiny marginal branch.  The first marginal branch  has     70% narrowing proximally and is only about 1.4-mm caliber vessel.     The circumflex in this territory is somewhat diffusely diseased and     then opens back up and has about 70% narrowing distally. 5. The saphenous vein graft to the distal posterolateral branch is     widely patent without significant narrowing and retrograde fills     the posterolateral branch into the PDA. 6. The right coronary artery is small dominant vessel without critical     disease. 7. Ventriculography in the RAO projection reveals well-preserved     global systolic function.  CONCLUSION: 1. Well-preserved left ventricular function. 2. Continued patency of internal mammary to the LAD. 3. Continued patency of the saphenous vein graft to the posterolateral     branch. 4. Small-caliber disease involving the first diagonal and first     marginal that do not appear to be optimal for percutaneous     intervention.  DISPOSITION:  The exact etiology of these symptoms are unclear.  We will get a D-dimer.  I will also likely do some other studies.  We  will see him back in the office in the next week.     Arturo Morton. Riley Kill, MD, [facility]     TDS/MEDQ  D:  03/08/2010  T:  03/09/2010  Job:  [id number]  Electronically Signed by Shawnie Pons MD [facility] on 04/20/2010 05:36:39 AM

## 2010-05-01 ENCOUNTER — Ambulatory Visit (INDEPENDENT_AMBULATORY_CARE_PROVIDER_SITE_OTHER): Payer: Medicare Other | Admitting: Internal Medicine

## 2010-05-01 ENCOUNTER — Encounter: Payer: Self-pay | Admitting: Internal Medicine

## 2010-05-01 VITALS — BP 128/72 | HR 60 | Ht 65.0 in | Wt 168.0 lb

## 2010-05-01 DIAGNOSIS — R1310 Dysphagia, unspecified: Secondary | ICD-10-CM

## 2010-05-01 DIAGNOSIS — Z860100 Personal history of colon polyps, unspecified: Secondary | ICD-10-CM

## 2010-05-01 DIAGNOSIS — R079 Chest pain, unspecified: Secondary | ICD-10-CM

## 2010-05-01 DIAGNOSIS — K219 Gastro-esophageal reflux disease without esophagitis: Secondary | ICD-10-CM

## 2010-05-01 NOTE — Progress Notes (Signed)
HISTORY OF PRESENT ILLNESS:  Brandon Rojas is a 65 y.o. male with a significant medical history as outlined below. He is followed in the esophagus, principally for a history of adenomatous colon polyps. Prior colonoscopies in 2003 in 2008 with tubular adenomas and diverticulosis. Sent now by his cardiologist regarding chest pain and GI symptoms. The patient was complaining of exertional chest pressure, reminding him of previous problems with angina. He is status post CABG. He underwent cardiac catheterization 03/08/2010. The summary was just have uncertain cause, continued medical therapy for coronary artery disease, and GI evaluation. Patient does have long-standing GERD symptoms. These are fairly classic and consists of heartburn regurgitation. Significant relief with Nexium. Significant symptoms off Nexium. Also with long-standing intermittent solid food dysphagia. No prior upper endoscopy, apparently. Care of his exertional chest pressure, this does not seem to be affected by meals or worsened with reflux symptoms. GI review of systems is otherwise entirely negative.  REVIEW OF SYSTEMS:  All non-GI ROS negative except for arthritis, back pain, shortness of breath.  Past Medical History  Diagnosis Date  . Coronary atherosclerosis of unspecified type of vessel, native or graft   . Personal history of unspecified circulatory disease   . Occlusion and stenosis of carotid artery without mention of cerebral infarction   . Unspecified essential hypertension   . Other specified cardiac dysrhythmias   . Other and unspecified hyperlipidemia   . Obesity, unspecified   . Anxiety state, unspecified   . Depressive disorder, not elsewhere classified   . Esophageal reflux   . Wheezing   . Personal history of colonic polyps   . Routine general medical examination at a health care facility   . Acute sinusitis, unspecified   . Diaphragmatic hernia without mention of obstruction or gangrene   .  Diverticulosis of colon (without mention of hemorrhage)   . Benign neoplasm of colon   . Personal history of diseases of skin and subcutaneous tissue   . Osteoarthrosis, unspecified whether generalized or localized, unspecified site   . Allergic rhinitis, cause unspecified   . Psychosexual dysfunction with inhibited sexual excitement   . Hiatal hernia   . Arthritis     Past Surgical History  Procedure Date  . Lung biopsy   . Inguinal hernia repair   . Ptca   . Coronary artery bypass graft     x 2    Social History Brandon Rojas  reports that he has quit smoking. He has never used smokeless tobacco. He reports that he does not drink alcohol or use illicit drugs.  family history includes Colon cancer in his sister.  Allergies  Allergen Reactions  . Atorvastatin   . Simvastatin        PHYSICAL EXAMINATION: Vital signs: BP 128/72  Pulse 60  Ht 5\' 5"  (1.651 m)  Wt 168 lb (76.204 kg)  BMI 27.96 kg/m2  Constitutional: generally well-appearing, no acute distress Psychiatric: alert and oriented x3, cooperative Eyes: extraocular movements intact, anicteric, conjunctiva pink Mouth: oral pharynx moist, no lesions Neck: supple no lymphadenopathy Cardiovascular: heart regular rate and rhythm, no murmur Lungs: clear to auscultation bilaterally Abdomen: soft, nontender, nondistended, no obvious ascites, no peritoneal signs, normal bowel sounds, no organomegaly Extremities: no lower extremity edema bilaterally Skin: no lesions on visible extremities Neuro: No focal deficits.    ASSESSMENT:  #1. GERD #2. 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 Exertional chest pain. Recent cardiac evaluation as noted. #4. History of adenomatous colon  polyps. Last colonoscopy October 2008   PLAN:  #1. Continue Nexium 40 mg daily. His breakthrough symptoms occur, this may be increased to twice a day dosage #2. Reflux precautions #3. Schedule upper endoscopy with  esophageal dilation.The nature of the procedure, as well as the risks, benefits, and alternatives were carefully and thoroughly reviewed with the patient. Ample time for discussion and questions allowed. The patient understood, was satisfied, and agreed to proceed. The patient is high-risk given his comorbidities and the need to address Plavix therapy. See below #4. Check with Dr. Riley Kill to see if we may hold the patient's Plavix 5 days prior to his esophageal dilation. Letter sent #5. Surveillance colonoscopy planned around October 2013

## 2010-05-01 NOTE — Patient Instructions (Signed)
EGD with Dil scheduled for you to have 05/08/10 3:00 pm arrive at 2:00 pm 4th floor. Hold Plavix x 5 days and our office is sending a letter to Dr. Riley Kill for approval.  We will call you to let you know if it is ok with him.  If you have not heard from our office by 05/02/10 (tomorrow), Please call us  [phone] attn:  Milford Cage, CMA EGD brochure and information given to you for review.

## 2010-05-02 ENCOUNTER — Telehealth: Payer: Self-pay | Admitting: *Deleted

## 2010-05-02 ENCOUNTER — Telehealth: Payer: Self-pay | Admitting: Cardiology

## 2010-05-02 NOTE — Telephone Encounter (Signed)
Brandon Rojas needs to know if pt can hold is plavix for five days. Dr office needs an answer today. [phone] ext [phone].Marland KitchenMarland KitchenMarland Kitchen

## 2010-05-02 NOTE — Telephone Encounter (Signed)
I spoke with the pt and he is taking plavix due to history of TIA.  The pt is followed by Dr Jonny Ruiz for Primary Care and this issue should be addressed with him per Dr Riley Kill. Pt aware that Dr Jonny Ruiz will make decision about holding plavix.  I left a message on Barbara's (with Dr Marina Goodell) voicemail with this information.

## 2010-05-02 NOTE — Telephone Encounter (Signed)
Called Dr. Rosalyn Charters office and left message for Leotis Shames, his nurse to call me with an answer today.  I will need to let patient know today.  Awaiting Lauren to call me.

## 2010-05-02 NOTE — Telephone Encounter (Signed)
Left message for pt to call back.  Pt saw Dr Marina Goodell on 05/01/10 and has been scheduled for Upper Endoscopy with esophageal dilitation.  The pt needs to hold plavix for 5 days.  I spoke with Dr Riley Kill and he said the pt most likely is not on plavix due to a DES.  The pt has a history of TIA and may be on Plavix due to this history.  I left a message for the pt to call back and clarify why he was placed on Plavix.

## 2010-05-03 NOTE — Telephone Encounter (Signed)
Please advise if patient can hold Plavix x 5 days prior to procedure.  Thanks.  We need to know today. Procedure is Monday 05/08/10.

## 2010-05-03 NOTE — Telephone Encounter (Signed)
Called patient and informed it was ok with Dr. Jonny Ruiz.

## 2010-05-03 NOTE — Telephone Encounter (Signed)
Ok to hold for 5 days

## 2010-05-03 NOTE — Telephone Encounter (Signed)
Sent to Dr. Jonny Ruiz as he is the one who prescribes and follows his Plavix intake.  Received an ok to hold and patient notified.

## 2010-05-03 NOTE — Telephone Encounter (Signed)
Pt. Called and informed ok to hold Plavix x 5 days per Dr. Jonny Ruiz

## 2010-05-05 ENCOUNTER — Encounter: Payer: Self-pay | Admitting: Internal Medicine

## 2010-05-08 ENCOUNTER — Ambulatory Visit (AMBULATORY_SURGERY_CENTER): Payer: Medicare Other | Admitting: Internal Medicine

## 2010-05-08 ENCOUNTER — Encounter: Payer: Self-pay | Admitting: Internal Medicine

## 2010-05-08 VITALS — BP 127/77 | HR 54 | Temp 97.5°F | Resp 18 | Ht 65.0 in | Wt 168.0 lb

## 2010-05-08 DIAGNOSIS — R1310 Dysphagia, unspecified: Secondary | ICD-10-CM

## 2010-05-08 DIAGNOSIS — K219 Gastro-esophageal reflux disease without esophagitis: Secondary | ICD-10-CM

## 2010-05-08 DIAGNOSIS — K222 Esophageal obstruction: Secondary | ICD-10-CM

## 2010-05-08 DIAGNOSIS — R079 Chest pain, unspecified: Secondary | ICD-10-CM

## 2010-05-08 MED ORDER — SODIUM CHLORIDE 0.9 % IV SOLN: 500.0000 mL | INTRAVENOUS | Status: DC

## 2010-05-09 ENCOUNTER — Telehealth: Payer: Self-pay

## 2010-05-09 NOTE — Telephone Encounter (Signed)

## 2010-05-23 NOTE — Assessment & Plan Note (Signed)
[facility]                         GASTROENTEROLOGY OFFICE NOTE   Brandon Rojas, Brandon Rojas                        MRN:          [id number]  DATE:10/08/2006                            DOB:          1945/02/01    REASON FOR EVALUATION:  Surveillance colonoscopy.   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  The patient has a history of  hypertension, gastroesophageal reflux disease, hyperlipidemia, anxiety  with depression, and coronary artery disease, for which he has undergone  angioplasty with stent placement in 1998.  The patient underwent  screening colonoscopy in July, 2003.  He was found to have multiple  diminutive polyps, which were adenomatous.  As well, marked  diverticulosis.  A follow-up in two years recommended.  He was seen in  the office in December, 2006 regarding surveillance colonoscopy and  chest pain.  He has been on Plavix and aspirin chronically.  At the time  of his last office visit, he was felt to have exertional angina.  The  plan was to postpone surveillance until this problem was adequately  evaluated.  His chart is not available, although he tells me from a  cardiac standpoint, he has been doing well and feeling fine for some  time.  He denies interval GI problems such as abdominal pain or  bleeding.  For his reflux, he stays on Nexium.   ALLERGIES:  No known drug allergies.   CURRENT MEDICATIONS:  1. Plavix 75 mg daily.  2. Aspirin 81 mg daily.  3. Nexium 40 mg daily.  4. Lisinopril 5 mg daily.  5. Provastatin 40 mg daily.  6. Sublingual nitroglycerin p.r.n.   FAMILY HISTORY:  As previously outlined in the chart without change.   SOCIAL HISTORY:  As previously outlined in the chart without change.   REVIEW OF SYSTEMS:  Negative.   PHYSICAL EXAMINATION:  Well-appearing male in no acute distress.  Blood pressure is 118/72, heart rate 60.  Weight  is 164 pounds.  He is 5  feet 5 inches in height.  HEENT:  Sclerae are anicteric.  Conjunctivae are pink.  Oral mucosa  intact.  No adenopathy.  LUNGS:  Clear.  HEART:  Regular.  ABDOMEN:  Soft without tenderness, mass, or hernia.  Good bowel sounds  heard.   IMPRESSION:  This is a 65 year old gentleman with coronary artery  disease, prior stent placement, and chronic aspirin/Plavix therapy.  He  also has a history of adenomatous colon polyp and reflux disease.  He  presents today regarding surveillance colonoscopy.  He is an appropriate  candidate without contraindication.  The nature of the procedure as well  as the risks, benefits and alternatives have been reviewed.  He  understood and agreed to proceed.  We discussed the pro's and cons of  performing his examination on or off Plavix.  After an informed  discussion, the decision has been made to perform the exam on aspirin  and Plavix with possible limitations accepted.     Brandon Bonito. Marina Goodell, MD  Electronically Signed  JNP/MedQ  DD: 10/10/2006  DT: 10/10/2006  Job #: [id number]   cc:   Corwin Levins, MD  Arturo Morton. Riley Kill, MD, [facility]

## 2010-05-23 NOTE — Assessment & Plan Note (Signed)
[facility]                            CARDIOLOGY OFFICE NOTE   THEODEN, MAUCH                        MRN:          [id number]  DATE:03/18/2008                            DOB:          04/05/1945    Mr. Markovitz is in for a followup visit.  He is stable.  He has not been  having any progressive symptoms.  He had carotids done.  This did not  reveal high-grade bilateral disease.  There is some tortuosity noted.  There is mild carotid disease bilaterally, 0-39% on either side.  He has  had no cardiac-type symptoms.   MEDICATIONS:  1. Plavix 75 mg daily.  2. Nexium 40 mg daily.  3. Lisinopril 5 mg daily.  4. Pravastatin 40 mg daily.  5. Baby aspirin daily.  6. Multivitamin daily.  7. Vitamin E.  8. Vitamin C.  9. Fish oil 1000 mg 2 tablets daily.   PHYSICAL EXAMINATION:  VITAL SIGNS:  The blood pressure is 132/70, the  pulse is 52.  LUNGS:  The lung fields are clear.  CARDIAC:  There is a soft S4 gallop.  There is no murmur.  EXTREMITIES:  No edema.   Electrocardiogram demonstrates sinus bradycardia with nonspecific ST  abnormality.  QTc is 414 msec.   IMPRESSION:  1. Coronary artery disease, status post coronary artery bypass graft      surgery, 2006.  2. 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  3. Hypercholesterolemia, on lipid-lowering therapy, with report by the      patient that he is at target per Dr. Jonny Ruiz.  4. Hypertension.   RECOMMENDATIONS:  1. Unfortunately, the patient has stopped Ginkgo biloba.  2. He has had carotid studies.  3. We will see him back in follow up in 1 year or sooner should      further problems arise.     Arturo Morton. Riley Kill, MD, [facility]  Electronically Signed    TDS/MedQ  DD: 03/18/2008  DT: 03/19/2008  Job #: [id number]   cc:   Corwin Levins, MD

## 2010-05-23 NOTE — Assessment & Plan Note (Signed)
[facility]                            CARDIOLOGY OFFICE NOTE   Brandon Rojas, Brandon Rojas                        MRN:          [id number]  DATE:01/16/2008                            DOB:          March 12, 1945    Brandon Rojas is in for followup visit.  In general, he is stable.  He has  not been having any ongoing chest pain or progressive shortness of  breath.  He does take Gingko.  He has had a prior TIA.  The patient has  stent placed in 1998, subsequently underwent revascularization surgery  in 2006.  He has been stable since that time.  He takes Gingko for  memory.   CURRENT MEDICATIONS:  1. Plavix 75 mg daily.  2. Nexium 40 mg daily.  3. Lisinopril 5 mg daily.  4. Pravastatin 40 mg daily.  5. Baby aspirin 81 mg daily.  6. Multivitamin daily.  7. Vitamin E 400 mg daily.  8. Vitamin C 500 mg daily.  9. Fish oil 1000 mg 2 tablets daily.  10.Ginkgo Biloba 1 daily.   PHYSICAL EXAMINATION:  VITAL SIGNS:  The blood pressure is 130/70 and  the pulse is 60.  CHEST:  The lung fields are clear.  CARDIAC:  Rhythm is regular.  There are no obvious carotid bruits.  EXTREMITIES:  No edema.   His electrocardiogram done Dr. Raphael Gibney office on November 20, 2007,  reveals sinus bradycardia with nonspecific T-wave abnormality.   His last radionuclide imaging study was done in late 2008, at that time,  he had a hypertensive response to exercise, and some mild ST depression,  but no perfusion deficits of significance.  Ejection fraction was 56%.   IMPRESSION:  1. Coronary artery disease, status post coronary artery bypass graft      surgery in 2006.  2. History of transient ischemic attack.  3. Hypercholesterolemia, on lipid lowering therapy.  4. Hypertension.   PLAN:  1. Discontinue Ginkgo Biloba.  2. We will do carotid studies given his prior findings.  3. I will have him back in 2 months to review these findings, and we      will discuss his current status  with regard to dual antiplatelet      therapy in light of continuing data.     Brandon Rojas. Riley Kill, MD, [facility]  Electronically Signed    TDS/MedQ  DD: 01/16/2008  DT: 01/17/2008  Job #: [id number]

## 2010-05-26 NOTE — Cardiovascular Report (Signed)
NAME:  [name], [name] NO.:  [id number]   MEDICAL RECORD NO.:  [id number]          PATIENT TYPE:  INP   LOCATION:  2030                         FACILITY:  [facility]   PHYSICIAN:  Arturo Morton. Riley Kill, M.D. [facility] OF BIRTH:  11/25/1945   DATE OF PROCEDURE:  12/24/2004  DATE OF DISCHARGE:                              CARDIAC CATHETERIZATION   INDICATIONS:  Brandon Rojas is a 65 year old gentleman well-known to [place]. He  had previous stenting of the left anterior descending artery and has done  well since 1998. He presents with recurrent chest discomfort suggestive of  angina and cardiac catheterization was indicated.   PROCEDURES:  1.  Left heart catheterization.  2.  Selective coronary arteriography.  3.  Selective left ventriculography.  4.  Subclavian angiography.   DESCRIPTION OF PROCEDURE:  The procedure was performed from the right  femoral artery using 6-French catheters. He tolerated the procedure well  without complication. We did a ventriculogram in the RAO projection and did  a roll shot because of the preoperative chest x-ray to lay out the aortic  root. There were no complications. I reviewed the films with the patient. I  subsequently spoke with the family in detail. Surgical consultation will be  obtained.   HEMODYNAMIC DATA:  1.  Central aortic pressure 128/64, mean 89.  2.  Left ventricular pressure 128/11.  3.  No gradient on pullback across the aortic valve.   ANGIOGRAPHIC DATA:  1.  The distal left main is slightly irregular with about 30% narrowing.  2.  The left anterior descending artery demonstrates about 40% narrowing and      there is a stent with segmental 30% narrowing throughout. Just after a      small diagonal branches is an 80% focal area of narrowing in the left      anterior descending artery. The distal LAD wraps the apex.  3.  The circumflex is a dominant vessel. There is an 80-90% area of      segmental plaquing overlying the  origin of the first marginal. The first      marginal itself has about 60%. This leads into a dominant circumflex      system. There is mild luminal irregularity throughout the circumflex      with two large posterolateral vessels. The second one being a posterior      descending branch. They are both very suitable for grafting, right      coronary artery is nondominant, free of critical disease.   VENTRICULOGRAPHY:  Demonstrates well-preserved left ventricular function. No  segmental abnormalities contraction were identified. There is no obvious  evidence of aortic aneurysm in the thoracic distribution.   CONCLUSION:  1.  Preserved left ventricular function.  2.  No evidence of high-grade restenosis at the previous stent site from      1998.  3.  Progressive disease in the left anterior descending artery after the      stent location.  4.  High-grade stenosis of the dominant circumflex overlapping the origin of      first marginal.  DISPOSITION:  I will review the films with the cardiac surgeons. My leaning  is in the direction of surgical revascularization. The patient has had a  TIA, however, and this will need to be considered in the big picture.      Arturo Morton. Riley Kill, M.D. [facility]  Electronically Signed     TDS/MEDQ  D:  12/25/2004  T:  12/25/2004  Job:  [id number]   cc:   Arturo Morton. Riley Kill, M.D. [facility]  [address]  [address]  [place]  [place] [address]   Patient's medical record

## 2010-05-26 NOTE — Discharge Summary (Signed)
NAME:  Brandon Rojas, Brandon Rojas NO.:  [id number]   MEDICAL RECORD NO.:  [id number]          PATIENT TYPE:  INP   LOCATION:  2013                         FACILITY:  [facility]   PHYSICIAN:  Brandon Rojas, M.D.  DATE OF BIRTH:  [birth year]   DATE OF ADMISSION:  12/22/2004  DATE OF DISCHARGE:  01/01/2005                                 DISCHARGE SUMMARY   PRIMARY DIAGNOSIS:  Coronary artery disease.   IN HOSPITAL DIAGNOSES:  1.  Glandular hypospadias.  2.  Acute blood loss anemia.  3.  Acute renal insufficiency.   SECONDARY DIAGNOSES:  1.  Hypertension.  2.  Hyperlipidemia.  3.  History of tobacco abuse.   PROCEDURE:  1.  Cardiac catheterization with left heart catheterization, selective      coronary arteriography, selective left ventriculography, subclavian      angiography.  2.  Coronary artery bypass graft x2 using left internal mammary artery to      left anterior descending and a saphenous vein graft to the circumflex.      EVH from the right lower extremity was done.   HISTORY OF PRESENT ILLNESS AND HOSPITAL COURSE:  The patient is a 65-year-  old male with a prior history of coronary artery disease status post  percutaneous intervention LAD several years ago.  He presents with unstable  angina.  The patient was taken to the cardiac catheterization by Dr. Riley Rojas  which demonstrated a patent stent to the proximal LAD, however, there was a  distal high-grade 80-90% stenosis of the LAD.  He also had a high-grade  proximal 90% stenosis of the dominant circumflex.  His ejection fraction  seemed to be well-preserved.  Following this catheterization CVTS was  consulted.  The patient was seen and evaluated by Dr. Donata Rojas.  Dr. Donata Rojas discussed with the patient undergoing coronary artery bypass grafting.  He discussed the risks and benefits of the procedure.  The patient  acknowledged understanding and wished to proceed.  Note the patient is on  Plavix since he is  status post stent placement and this will be held for 72  hours prior to undergoing coronary artery bypass graft.  The patient  underwent preoperative bilateral carotid duplex ultrasound showing no  significant ICA stenosis.  Prior to undergoing surgery the patient remained  stable.  He was seen by GU with glandular hypospadias prior to undergoing  CABG.  GU will be on standby if needed for Foley placement prior to CABG.  The patient's surgery was scheduled for December 28, 2004.   The patient was taken to the operating room on December 28, 2004 where he  underwent coronary artery bypass grafting x2 using a left internal mammary  artery to the left anterior descending and a saphenous vein graft to the  circumflex.  EVH was done from the right lower extremity.  The patient  tolerated this procedure well and was transferred up to the intensive care  unit in stable condition.  The patient's hematocrit was low postoperatively  ___________ 25%.  He was extubated on the evening of  surgery.  The patient's  postoperative course was complicated by acute blood loss anemia.  Postoperative one the hematocrit dropped to 21%.  He received one unit of  packed red blood cells.  This was monitored during his hospital stay.  It  had dropped again postoperative three to ___________ requiring another unit  of packed red blood cells.  It increased appropriately following that unit.  Prior to discharge the patient's hemoglobin and hematocrit will need to be  stable.  Last hemoglobin and hematocrit noted on postoperative day four to  be 8.7 and 25.1.  The patient will be started on iron prior to discharge  home.  The patient's chest tubes and lines are discontinued in the normal  fashion.  He was transferred out to 2000 on postoperative day there.  Hemoglobin A1C obtained preoperatively was noted to be 5.3.  CBG's following  surgery were stable.  The patient was able to be weaned off oxygen sating  greater than  90% prior to discharge home.  He remained in normal sinus  rhythm.  The patient's incisions are dry and intact and healing well.  He  was noted to have some serosanguineous drainage from his right thigh area.  No signs of infection were noted and the dressings were changed twice daily.  The patient remained afebrile during his hospital stay.  He did develop  acute renal insufficiency with creatinine going up to 1.7 postoperative day  four.  This will be monitored and will need to be trended down prior to  discharge home.  The patient was out of bed ambulating well.  He was  tolerating a regular diet and no nausea or vomiting.  The patient was  encouraged to use incentive spirometer.   The patient is tentatively ready for discharge home postoperative day five,  January 02, 2005 pending CBC and BNP results.  A followup appointment will  be scheduled with Dr. Donata Rojas in three weeks.  The patient will need to  contact Dr. Titus Rojas office to schedule a followup appointment with him in  two weeks.  The patient will obtain a PA and lateral chest x-ray at this  appointment which he will then bring with him to Dr. Zenaida Niece Rojas's  appointment.  Brandon Rojas received instructions on diet, activity level and  incisional care.  He was told no driving until released to do so, no heavy  lifting over 10 pounds.  The patient was told to ambulate 3-4 times per day  and progress as tolerated.  He was told to continue his __________  exercises.  He was told he is allowed to shower washing his incisions using  soap and water.  He is to contact the office if he develops any drainage or  opening from any of his incision sites.  The patient was educated on diet to  be low fat and low salt.  He acknowledges understanding.   DISCHARGE MEDICATIONS:  1.  Aspirin 325 mg p.o. daily.  2.  Toprol XL 25 mg p.o. daily.  3.  Pravachol 40 mg p.o. q.h.s. 4.  Nexium 40 mg p.o. daily.  5.  Nephro-Vite 150 mg p.o. b.i.d.  6.   Folic acid 1 mg daily.  7.  Oxycodone 5 mg 1-2 tabs p.o. q.4-6h. p.r.n. pain.      Brandon Rojas, [place]      Brandon Rojas, M.D.  Electronically Signed    KMD/MEDQ  D:  01/01/2005  T:  01/02/2005  Job:  [id number]  cc:   Cecil Cranker, M.D.  [address]  [address]  [place]  [place] [address]

## 2010-05-26 NOTE — Assessment & Plan Note (Signed)
[facility]                              CARDIOLOGY OFFICE NOTE   [name], [name]                          MRN:          [id number]  DATE:  07/24/2005                              DOB:      1945-05-13     I had the pleasure of seeing Brandon Rojas in the office today in follow-up.  Cardiac-wise he seems to be getting along well.  He denies chest pain or  significant shortness of breath.  Importantly, this gentleman developed an  acute coronary syndrome ultimately requiring revascularization surgery in  December.  He has done well since then.  His only complaint at the present  time is some discomfort in the right calf which is near the site of  endovascular harvest.  He denies any ongoing shortness of breath.   Today on his examination the weight is 160 pounds which is stable.  The  blood pressure is 115/65 and the pulse is 55.  The jugular veins are  nondisplaced.  The carotid upstrokes are brisk.  The median sternotomy is  well healed.  There is a normal first and second heart sound without  significant murmur, rub, or gallop.  The abdomen is soft without obvious  hepatosplenomegaly and I cannot feel a widened aorta.  His extremities do  not reveal significant edema.  He does have some calf tenderness on  palpation.  Pulses are intact.   The electrocardiogram demonstrates marked sinus bradycardia, but is  otherwise within normal limits.   The patient appears to be doing well.  He has had a TIA in the past.  Nonetheless, he is on dual antiplatelet therapy.  I have elected to  recommend that he decrease his aspirin to 81 mg a day since he has remained  stable and he will remain on Plavix at the present time.  We will get a CBC  and I have scheduled him to come in for a lipid profile.  He does not have  any significant swelling in the right lower extremity or erythema but with  his persistent calf tenderness after surgery I think we will go ahead  and  get a Doppler to exclude an underlying DVT.  We will not see him back in  follow-up for six months to a year, but would be happy to see him at any  time you feel necessary.  Thanks for allowing me to share in his care.                              Brandon Rojas. Riley Kill, MD, [facility]    TDS/MedQ  DD:  07/24/2005  DT:  07/24/2005  Job #:  [id number]   cc:   Corwin Levins, MD

## 2010-05-26 NOTE — Op Note (Signed)
NAME:  HANEEF, HALLQUIST NO.:  [id number]   MEDICAL RECORD NO.:  [id number]          PATIENT TYPE:  INP   LOCATION:  2307                         FACILITY:  [facility]   PHYSICIAN:  Kerin Perna, M.D.  DATE OF BIRTH:  13-Mar-1945   DATE OF PROCEDURE:  12/28/2004  DATE OF DISCHARGE:                                 OPERATIVE REPORT   OPERATION:  Coronary artery bypass grafting x2 utilizing left internal  mammary artery graft to the LAD and a endoscopically harvested saphenous  vein graft to the distal circumflex with placement of the On-Q wound  catheter system.   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   SURGEON:  Kerin Perna, M.D.   ASSISTANT:  Pecola Leisure, PA-C.   ANESTHESIA:  General.   INDICATIONS:  The patient is a 65 year old male with prior history of  coronary disease, status post percutaneous intervention of the LAD several  years ago. He presents with unstable angina. A cardiac catheterization by  Dr. Riley Kill demonstrated a patent stent to the proximal LAD, however, a  distal high-grade 80-90% stenosis of the LAD. He also had a high-grade  proximal 90% stenosis of the dominant circumflex. His ejection fraction was  well-preserved. Based on his coronary anatomy, he was felt to be candidate  for surgical evaluation. His surgery was delayed for 72 hours due to  longstanding use of Plavix for a TIA over a year ago.   Prior to surgery, I discussed the patient's cardiac cath with the patient  and family and reviewed the indications and expected benefits of coronary  bypass surgery for treatment of his coronary artery disease. I discussed the  alternatives to surgery as well. I reviewed with the patient the major  aspects of planned procedure including location of the surgical incisions,  choice of conduit for grafts, the use of general anesthesia and  cardiopulmonary bypass, and expected  postoperative hospital recovery. I  reviewed with the patient the major risks to him of coronary bypass surgery  including risks of MI, CVA, bleeding, infection, and death. I planned to use  the aprotinin protocol to reduce the bleeding complications and blood  transfusion requirement, due to his recent use of Plavix. After reviewing  all these issues, the patient demonstrated his understanding and agreed to  proceed with operation as planned under what I felt was an informed consent.   OPERATIVE FINDINGS:  The patient has small size vessels consistent with his  small stature. The OM1 was too small to graft and was less than 1 mm. The  vein was harvested endoscopically from the right leg and was of good  quality. The mammary artery was a good vessel with excellent flow. The  patient did not require any blood products during the operation.   PROCEDURE:  The patient was brought to operating room and placed supine on  the operating table where general anesthesia was induced under invasive  hemodynamic monitoring. The chest, abdomen and legs were prepped with  Betadine and draped as a sterile field. A  sternal incision was made as the  saphenous vein was harvested endoscopically from the right thigh. The  internal mammary artery was harvested as a pedicle graft from its origin at  the subclavian vessels. Heparin was administered and ACT was documented as  being therapeutic. The sternal retractor was placed and pericardium was  opened and suspended. Pursestrings were placed through the descending aorta  and right atrium and the patient was cannulated and placed on bypass. The  coronaries were identified for grafting. The LAD was deeply intramyocardial.  The circumflex proximally was a very small vessel, too small graft. The  distal circumflex was a 1.3 mm vessel and was graftable. The patient was  cooled to 32 degrees and cardioplegia catheters were placed for both  antegrade aortic and  retrograde coronary sinus cardioplegia. The aortic  crossclamp was applied.  There were 800 mL of cold blood cardioplegia  delivered in split doses between the antegrade aortic and retrograde  coronary sinus catheters. There was  good cardioplegic arrest and septal  temperature dropped less than 12 degrees. Topical iced saline was used to  augment myocardial preservation and a pericardial insulator pad was used to  protect the left phrenic nerve.   The distal coronary anastomoses were then performed. The first distal  anastomosis was to the distal circumflex. This a 1.3 mm vessel with proximal  90% stenosis.  A reverse saphenous vein sewn end-to-side with running 8-0  Prolene and there was good flow through graft. The second distal anastomosis  was to the distal third of the LAD. This was deeply intramyocardial more  proximally.  The left internal mammary artery pedicle was brought through an  opening created in the left lateral pericardium and was brought down onto  the LAD and sewn end-to-side with running 8-0 Prolene. There was excellent  flow through the anastomosis after briefly releasing the pedicle bulldog on  the mammary pedicle. The mammary bulldog clamp was reapplied and a pedicle  was secured to the epicardium.   The patient was redosed with cardioplegia. While the crossclamp was still in  place, a single proximal vein anastomosis on the ascending aorta was  constructed using a 4.0 mm punch and running 7-0 Prolene. Prior to tying  down the proximal anastomosis, air was vented from the coronaries and the  left side of heart with a dose of retrograde warm blood cardioplegia and the  usual de-airing maneuvers on bypass. The crossclamp was then removed and the  heart resumed a spontaneous rhythm.   Air was aspirated from the vein graft with a 27 gauge needle and vein graft was opened and had good flow. Hemostasis was documented with proximal and  distal anastomoses. The patient  was rewarmed to 37 degrees. Cardioplegia  catheters were removed. Temporary pacing wires were applied. Lungs were re-  expanded and ventilator was resumed. The patient was then weaned from bypass  without difficulty on no inotropes in a sinus rhythm. Cardiac output and  blood pressure were stable. Protamine was administered without adverse  reaction. Cannulas were removed and the mediastinum and leg incision was  irrigated in a standard fashion. The superior pericardial fat was closed  over the aorta and vein grafts. Two mediastinal and left pleural chest tube  were placed and brought out through separate incisions. The sternum was  closed with interrupted steel wire. The pectoralis fascia was closed with a  running #1 Vicryl and the subcutaneous and skin layers were closed with  running Vicryl.  Prior to closure of the leg, there was persistent bleeding from the end of  tunnel which necessitated opening of the incision over the tunnel and suture  transfixion of a bleeding point in the muscle compartment. The wound was  then irrigated and closed in layers and a sterile dressing was applied. The  patient returned the ICU in stable condition. Total bypass time was 100  minutes with crossclamp time of 45 minutes.      Kerin Perna, M.D.  Electronically Signed    PV/MEDQ  D:  12/28/2004  T:  12/30/2004  Job:  [id number]   cc:   [facility]   [facility]

## 2010-06-01 ENCOUNTER — Other Ambulatory Visit (INDEPENDENT_AMBULATORY_CARE_PROVIDER_SITE_OTHER): Payer: Medicare Other

## 2010-06-01 ENCOUNTER — Other Ambulatory Visit: Payer: Self-pay | Admitting: Internal Medicine

## 2010-06-01 DIAGNOSIS — E7800 Pure hypercholesterolemia, unspecified: Secondary | ICD-10-CM

## 2010-06-01 DIAGNOSIS — Z79899 Other long term (current) drug therapy: Secondary | ICD-10-CM

## 2010-06-01 LAB — HEPATIC FUNCTION PANEL
Alkaline Phosphatase: 48 U/L (ref 39–117)
Bilirubin, Direct: 0.1 mg/dL (ref 0.0–0.3)
Total Bilirubin: 0.5 mg/dL (ref 0.3–1.2)

## 2010-06-01 LAB — LIPID PANEL
LDL Cholesterol: 113 mg/dL — ABNORMAL HIGH (ref 0–99)
Total CHOL/HDL Ratio: 4
VLDL: 36 mg/dL (ref 0.0–40.0)

## 2010-06-07 ENCOUNTER — Encounter: Payer: Self-pay | Admitting: Internal Medicine

## 2010-06-07 DIAGNOSIS — Z0000 Encounter for general adult medical examination without abnormal findings: Secondary | ICD-10-CM | POA: Insufficient documentation

## 2010-06-07 DIAGNOSIS — Z0001 Encounter for general adult medical examination with abnormal findings: Secondary | ICD-10-CM | POA: Insufficient documentation

## 2010-06-08 ENCOUNTER — Encounter: Payer: Self-pay | Admitting: Internal Medicine

## 2010-06-08 ENCOUNTER — Ambulatory Visit (INDEPENDENT_AMBULATORY_CARE_PROVIDER_SITE_OTHER): Payer: Medicare Other | Admitting: Internal Medicine

## 2010-06-08 VITALS — BP 120/70 | HR 52 | Temp 99.3°F | Ht 65.0 in | Wt 161.5 lb

## 2010-06-08 DIAGNOSIS — N529 Male erectile dysfunction, unspecified: Secondary | ICD-10-CM | POA: Insufficient documentation

## 2010-06-08 DIAGNOSIS — E785 Hyperlipidemia, unspecified: Secondary | ICD-10-CM

## 2010-06-08 DIAGNOSIS — Z79899 Other long term (current) drug therapy: Secondary | ICD-10-CM

## 2010-06-08 DIAGNOSIS — M549 Dorsalgia, unspecified: Secondary | ICD-10-CM | POA: Insufficient documentation

## 2010-06-08 DIAGNOSIS — Z0000 Encounter for general adult medical examination without abnormal findings: Secondary | ICD-10-CM

## 2010-06-08 DIAGNOSIS — I10 Essential (primary) hypertension: Secondary | ICD-10-CM

## 2010-06-08 MED ORDER — VARDENAFIL HCL 20 MG PO TABS: 20.0000 mg | ORAL_TABLET | Freq: Every day | ORAL | Status: DC | PRN

## 2010-06-08 MED ORDER — CYCLOBENZAPRINE HCL 5 MG PO TABS: 5.0000 mg | ORAL_TABLET | Freq: Three times a day (TID) | ORAL | Status: AC | PRN

## 2010-06-08 MED ORDER — VARDENAFIL HCL 20 MG PO TABS: 20.0000 mg | ORAL_TABLET | ORAL | Status: DC | PRN

## 2010-06-08 MED ORDER — LOVASTATIN 40 MG PO TABS: 40.0000 mg | ORAL_TABLET | Freq: Every day | ORAL | Status: DC

## 2010-06-08 MED ORDER — TRAMADOL HCL 50 MG PO TABS: 50.0000 mg | ORAL_TABLET | Freq: Four times a day (QID) | ORAL | Status: DC | PRN

## 2010-06-08 NOTE — Assessment & Plan Note (Signed)
Pt willing to try lovastatin 40 mg , in light of his CV hx and goal ldl < 70 - to start med, with f/u lab in 4 wks

## 2010-06-08 NOTE — Assessment & Plan Note (Signed)
To try levitra prn, less expensive

## 2010-06-08 NOTE — Assessment & Plan Note (Signed)
stable overall by hx and exam, most recent data reviewed with pt, and pt to continue medical treatment as before  BP Readings from Last 3 Encounters:  06/08/10 120/70  05/08/10 127/77  05/01/10 128/72

## 2010-06-08 NOTE — Assessment & Plan Note (Signed)
Moderate recurrent, exam benign, suspect MSK strain +/- underlying DJD/DDD - for tramadol/flexeril prn,  to f/u any worsening symptoms or concerns

## 2010-06-08 NOTE — Progress Notes (Signed)
Subjective:    Patient ID: Brandon Rojas, male    DOB: 24-Dec-1945, 65 y.o.   MRN: [id number]  HPI  Here to f/u; has been tyring red yeast rice and fish oil instead of statin;  Had card cath feb 2012 and f/u with Dr Riley Kill in march per pt; is concerned about cognitive impairment on the pravastatin and thinks he is better not taking the  Pravastatin; Pt denies chest pain, increased sob or doe, wheezing, orthopnea, PND, increased LE swelling, palpitations, dizziness or syncope.  Pt denies new neurological symptoms such as new headache, or facial or extremity weakness or numbness   Pt denies polydipsia, polyuria  Pt states overall good compliance with meds, trying to follow lower cholesterol diet, wt overall stable but little exercise however.   Pt continues to have recurring LBP without change in severity, bowel or bladder change, fever, wt loss,  worsening LE pain/numbness/weakness, gait change or falls, has been intermittent for yrs since he fell getting off a horse, told several times he has muscle spasm at [facility]. Worse to walk uphill on incline Pt denies fever, wt loss, night sweats, loss of appetite, or other constitutional symptoms Has had ongoing ED symptoms, simply need refills today, but meds are expensive  Past Medical History  Diagnosis Date  . Coronary atherosclerosis of unspecified type of vessel, native or graft   . Personal history of unspecified circulatory disease   . Occlusion and stenosis of carotid artery without mention of cerebral infarction   . Unspecified essential hypertension   . Other specified cardiac dysrhythmias   . Other and unspecified hyperlipidemia   . Obesity, unspecified   . Anxiety state, unspecified   . Depressive disorder, not elsewhere classified   . Esophageal reflux   . Wheezing   . Personal history of colonic polyps   . Routine general medical examination at a health care facility   . Acute sinusitis, unspecified   . Diaphragmatic hernia without mention of  obstruction or gangrene   . Diverticulosis of colon (without mention of hemorrhage)   . Benign neoplasm of colon   . Personal history of diseases of skin and subcutaneous tissue   . Osteoarthrosis, unspecified whether generalized or localized, unspecified site   . Allergic rhinitis, cause unspecified   . Psychosexual dysfunction with inhibited sexual excitement   . Hiatal hernia   . Arthritis    Past Surgical History  Procedure Date  . Lung biopsy   . Inguinal hernia repair   . Ptca   . Coronary artery bypass graft     x 2    reports that he has quit smoking. He has never used smokeless tobacco. He reports that he does not drink alcohol or use illicit drugs. family history includes Colon cancer in his sister. Allergies  Allergen Reactions  . Atorvastatin   . Simvastatin    Review of Systems All otherwise neg per pt     Objective:   Physical Exam BP 120/70  Pulse 52  Temp(Src) 99.3 F (37.4 C) (Oral)  Ht 5\' 5"  (1.651 m)  Wt 161 lb 8 oz (73.256 kg)  BMI 26.88 kg/m2  SpO2 96% Physical Exam  VS noted Constitutional: Pt appears well-developed and well-nourished.  HENT: Head: Normocephalic.  Right Ear: External ear normal.  Left Ear: External ear normal.  Eyes: Conjunctivae and EOM are normal. Pupils are equal, round, and reactive to light.  Neck: Normal range of motion. Neck supple.  Cardiovascular: Normal rate and regular rhythm.  Pulmonary/Chest: Effort normal and breath sounds normal.  Abd:  Soft, NT, non-distended, + BS Neurological: Pt is alert. No cranial nerve deficit.  Motor/sens/dtr intact bilat LE's  Skin: Skin is warm. No erythema.  Psychiatric: Pt behavior is normal. Thought content normal.  Spine nontender, no rash, swelling, spasm       Assessment & Plan:

## 2010-06-08 NOTE — Patient Instructions (Signed)
Take all new medications as prescribed Continue all other medications as before Please return in 4 wks for LAB only  (cholesterol and liver tests Please return in 6 mo with Lab testing done 3-5 days before

## 2010-07-11 ENCOUNTER — Other Ambulatory Visit (INDEPENDENT_AMBULATORY_CARE_PROVIDER_SITE_OTHER): Payer: Medicare Other

## 2010-07-11 DIAGNOSIS — Z79899 Other long term (current) drug therapy: Secondary | ICD-10-CM

## 2010-07-11 DIAGNOSIS — E785 Hyperlipidemia, unspecified: Secondary | ICD-10-CM

## 2010-07-11 LAB — LIPID PANEL
Cholesterol: 138 mg/dL (ref 0–200)
LDL Cholesterol: 64 mg/dL (ref 0–99)
Triglycerides: 129 mg/dL (ref 0.0–149.0)
VLDL: 25.8 mg/dL (ref 0.0–40.0)

## 2010-07-11 LAB — HEPATIC FUNCTION PANEL
Albumin: 3.8 g/dL (ref 3.5–5.2)
Alkaline Phosphatase: 59 U/L (ref 39–117)
Total Protein: 6.7 g/dL (ref 6.0–8.3)

## 2010-09-06 ENCOUNTER — Other Ambulatory Visit: Payer: Self-pay | Admitting: Internal Medicine

## 2010-09-06 MED ORDER — TRAMADOL HCL 50 MG PO TABS: 50.0000 mg | ORAL_TABLET | Freq: Four times a day (QID) | ORAL | Status: DC | PRN

## 2010-09-06 NOTE — Telephone Encounter (Signed)
Done per emr 

## 2010-09-07 NOTE — Telephone Encounter (Signed)
I think this may be a second request for tramadol, but this was already addressed aug 29

## 2010-09-18 ENCOUNTER — Other Ambulatory Visit: Payer: Self-pay | Admitting: Internal Medicine

## 2010-09-20 ENCOUNTER — Other Ambulatory Visit: Payer: Self-pay | Admitting: Internal Medicine

## 2010-11-20 ENCOUNTER — Encounter: Payer: Self-pay | Admitting: Internal Medicine

## 2010-11-20 ENCOUNTER — Ambulatory Visit (INDEPENDENT_AMBULATORY_CARE_PROVIDER_SITE_OTHER): Payer: Medicare Other | Admitting: Internal Medicine

## 2010-11-20 VITALS — BP 102/60 | HR 54 | Temp 98.8°F | Ht 65.0 in | Wt 167.1 lb

## 2010-11-20 DIAGNOSIS — I10 Essential (primary) hypertension: Secondary | ICD-10-CM

## 2010-11-20 DIAGNOSIS — R062 Wheezing: Secondary | ICD-10-CM

## 2010-11-20 DIAGNOSIS — J209 Acute bronchitis, unspecified: Secondary | ICD-10-CM

## 2010-11-20 DIAGNOSIS — F411 Generalized anxiety disorder: Secondary | ICD-10-CM

## 2010-11-20 MED ORDER — TRAMADOL HCL 50 MG PO TABS: 50.0000 mg | ORAL_TABLET | Freq: Four times a day (QID) | ORAL | Status: DC | PRN

## 2010-11-20 MED ORDER — AZITHROMYCIN 250 MG PO TABS: ORAL_TABLET | ORAL | Status: AC

## 2010-11-20 MED ORDER — NITROGLYCERIN 0.4 MG SL SUBL: 0.4000 mg | SUBLINGUAL_TABLET | SUBLINGUAL | Status: DC | PRN

## 2010-11-20 MED ORDER — PREDNISONE 10 MG PO TABS: 10.0000 mg | ORAL_TABLET | Freq: Every day | ORAL | Status: AC

## 2010-11-20 NOTE — Patient Instructions (Signed)
Take all new medications as prescribed Continue all other medications as before Your refills were also sent in today

## 2010-11-21 ENCOUNTER — Encounter: Payer: Self-pay | Admitting: Internal Medicine

## 2010-11-21 NOTE — Progress Notes (Signed)
Subjective:    Patient ID: Brandon Rojas, male    DOB: 10-16-1945, 65 y.o.   MRN: [id number]  HPI  Here with acute onset mild to mod 2-3 days ST, HA, general weakness and malaise, with prod cough greenish sputum, but Pt denies chest pain, increased sob or doe, wheezing, orthopnea, PND, increased LE swelling, palpitations, dizziness or syncope, except for mild wheezing onset this am.  Pt denies new neurological symptoms such as new headache, or facial or extremity weakness or numbness   Pt denies polydipsia, polyuria.  Denies worsening depressive symptoms, suicidal ideation, or panic, though has ongoing anxiety, not increased recently.    Pt denies fever, wt loss, night sweats, loss of appetite, or other constitutional symptoms except for the above. Past Medical History  Diagnosis Date  . Coronary atherosclerosis of unspecified type of vessel, native or graft   . Personal history of unspecified circulatory disease   . Occlusion and stenosis of carotid artery without mention of cerebral infarction   . Unspecified essential hypertension   . Other specified cardiac dysrhythmias   . Other and unspecified hyperlipidemia   . Obesity, unspecified   . Anxiety state, unspecified   . Depressive disorder, not elsewhere classified   . Esophageal reflux   . Wheezing   . Personal history of colonic polyps   . Routine general medical examination at a health care facility   . Acute sinusitis, unspecified   . Diaphragmatic hernia without mention of obstruction or gangrene   . Diverticulosis of colon (without mention of hemorrhage)   . Benign neoplasm of colon   . Personal history of diseases of skin and subcutaneous tissue   . Osteoarthrosis, unspecified whether generalized or localized, unspecified site   . Allergic rhinitis, cause unspecified   . Psychosexual dysfunction with inhibited sexual excitement   . Hiatal hernia   . Arthritis    Past Surgical History  Procedure Date  . Lung biopsy   .  Inguinal hernia repair   . Ptca   . Coronary artery bypass graft     x 2    reports that he has quit smoking. He has never used smokeless tobacco. He reports that he does not drink alcohol or use illicit drugs. family history includes Colon cancer in his sister. Allergies  Allergen Reactions  . Atorvastatin   . Simvastatin    Current Outpatient Prescriptions on File Prior to Visit  Medication Sig Dispense Refill  . aspirin 81 MG tablet Take 81 mg by mouth daily.        . B Complex Vitamins (VITAMIN B COMPLEX PO) Take 1 tablet by mouth daily.        . clopidogrel (PLAVIX) 75 MG tablet Take 75 mg by mouth daily.        . cyclobenzaprine (FLEXERIL) 5 MG tablet Take 1 tablet (5 mg total) by mouth 3 (three) times daily as needed for muscle spasms.  60 tablet  1  . lisinopril (PRINIVIL,ZESTRIL) 5 MG tablet Take 5 mg by mouth daily.        [name] lovastatin (MEVACOR) 40 MG tablet Take 1 tablet (40 mg total) by mouth daily.  90 tablet  3  . NEXIUM 40 MG capsule take 1 capsule by mouth once daily  90 capsule  2  . Omega-3 Fatty Acids (FISH OIL) 1000 MG CAPS Take 1-2 capsules by mouth daily.        . vardenafil (LEVITRA) 20 MG tablet Take 1 tablet (20 mg total)  by mouth as needed for erectile dysfunction.  10 tablet  5  . vitamin E 400 UNIT capsule Take 400 Units by mouth daily.        . Red Yeast Rice 600 MG CAPS Take 1 capsule by mouth daily.         Current Facility-Administered Medications on File Prior to Visit  Medication Dose Route Frequency Provider Last Rate Last Dose  . 0.9 %  sodium chloride infusion  500 mL Intravenous Continuous Yancey Flemings, MD       Review of Systems Review of Systems  Constitutional: Negative for diaphoresis and unexpected weight change.  HENT: Negative for drooling and tinnitus.   Eyes: Negative for photophobia and visual disturbance.  Respiratory: Negative for choking and stridor.   Gastrointestinal: Negative for vomiting and blood in stool.  Genitourinary:  Negative for hematuria and decreased urine volume.     Objective:   Physical Exam BP 102/60  Pulse 54  Temp(Src) 98.8 F (37.1 C) (Oral)  Ht 5\' 5"  (1.651 m)  Wt 167 lb 2 oz (75.807 kg)  BMI 27.81 kg/m2  SpO2 95% Physical Exam  VS noted, mild ill Constitutional: Pt appears well-developed and well-nourished.  HENT: Head: Normocephalic.  Right Ear: External ear normal.  Left Ear: External ear normal.  Bilat tm's mild erythema.  Sinus nontender.  Pharynx mild erythema Eyes: Conjunctivae and EOM are normal. Pupils are equal, round, and reactive to light.  Neck: Normal range of motion. Neck supple.  Cardiovascular: Normal rate and regular rhythm.   Pulmonary/Chest: Effort normal and breath sounds decreased with mild wheeze.  Neurological: Pt is alert. No cranial nerve deficit.  Skin: Skin is warm. No erythema.  Psychiatric: Pt behavior is normal. Thought content normal. not anxius or depressed affect today    Assessment & Plan:

## 2010-11-21 NOTE — Assessment & Plan Note (Signed)
Mild to mod, for steroid course,  to f/u any worsening symptoms or concerns

## 2010-11-21 NOTE — Assessment & Plan Note (Signed)
stable overall by hx and exam, most recent data reviewed with pt, and pt to continue medical treatment as before  BP Readings from Last 3 Encounters:  11/20/10 102/60  06/08/10 120/70  05/08/10 127/77

## 2010-11-21 NOTE — Assessment & Plan Note (Signed)
Mild to mod, for antibx course,  to f/u any worsening symptoms or concerns 

## 2010-11-21 NOTE — Assessment & Plan Note (Signed)
stable overall by hx and exam, most recent data reviewed with pt, and pt to continue medical treatment as before Lab Results  Component Value Date   WBC 8.4 03/06/2010   HGB 14.0 03/06/2010   HCT 41.0 03/06/2010   PLT 261.0 03/06/2010   GLUCOSE 92 03/06/2010   CHOL 138 07/11/2010   TRIG 129.0 07/11/2010   HDL 48.70 07/11/2010   LDLCALC 64 07/11/2010   ALT 17 07/11/2010   AST 18 07/11/2010   NA 143 03/06/2010   K 4.5 03/06/2010   CL 110 03/06/2010   CREATININE 1.1 03/06/2010   BUN 13 03/06/2010   CO2 29 03/06/2010   TSH 1.84 12/05/2009   PSA 1.03 12/05/2009   INR 1.0 03/06/2010

## 2010-12-05 ENCOUNTER — Other Ambulatory Visit (INDEPENDENT_AMBULATORY_CARE_PROVIDER_SITE_OTHER): Payer: Medicare Other

## 2010-12-05 DIAGNOSIS — Z0000 Encounter for general adult medical examination without abnormal findings: Secondary | ICD-10-CM

## 2010-12-05 DIAGNOSIS — I10 Essential (primary) hypertension: Secondary | ICD-10-CM

## 2010-12-05 DIAGNOSIS — Z125 Encounter for screening for malignant neoplasm of prostate: Secondary | ICD-10-CM

## 2010-12-05 LAB — BASIC METABOLIC PANEL
CO2: 26 mEq/L (ref 19–32)
Calcium: 10 mg/dL (ref 8.4–10.5)
Glucose, Bld: 90 mg/dL (ref 70–99)
Potassium: 4.6 mEq/L (ref 3.5–5.1)
Sodium: 142 mEq/L (ref 135–145)

## 2010-12-05 LAB — CBC WITH DIFFERENTIAL/PLATELET
Basophils Relative: 0.7 % (ref 0.0–3.0)
HCT: 43 % (ref 39.0–52.0)
Hemoglobin: 14.5 g/dL (ref 13.0–17.0)
Lymphocytes Relative: 19.7 % (ref 12.0–46.0)
Lymphs Abs: 1.3 10*3/uL (ref 0.7–4.0)
MCHC: 33.8 g/dL (ref 30.0–36.0)
Monocytes Relative: 9.7 % (ref 3.0–12.0)
Neutro Abs: 4.3 10*3/uL (ref 1.4–7.7)
RBC: 4.63 Mil/uL (ref 4.22–5.81)
RDW: 13.5 % (ref 11.5–14.6)

## 2010-12-05 LAB — URINALYSIS, ROUTINE W REFLEX MICROSCOPIC
Nitrite: NEGATIVE
Specific Gravity, Urine: 1.01 (ref 1.000–1.030)
Total Protein, Urine: NEGATIVE
pH: 6 (ref 5.0–8.0)

## 2010-12-05 LAB — LIPID PANEL: Total CHOL/HDL Ratio: 4

## 2010-12-05 LAB — HEPATIC FUNCTION PANEL
ALT: 21 U/L (ref 0–53)
Bilirubin, Direct: 0.1 mg/dL (ref 0.0–0.3)
Total Bilirubin: 0.7 mg/dL (ref 0.3–1.2)

## 2010-12-05 LAB — PSA: PSA: 1.21 ng/mL (ref 0.10–4.00)

## 2010-12-12 ENCOUNTER — Encounter: Payer: Self-pay | Admitting: Internal Medicine

## 2010-12-12 ENCOUNTER — Ambulatory Visit (INDEPENDENT_AMBULATORY_CARE_PROVIDER_SITE_OTHER): Payer: Medicare Other | Admitting: Internal Medicine

## 2010-12-12 VITALS — BP 110/70 | HR 60 | Temp 98.0°F | Ht 65.0 in | Wt 161.5 lb

## 2010-12-12 DIAGNOSIS — Z0000 Encounter for general adult medical examination without abnormal findings: Secondary | ICD-10-CM

## 2010-12-12 DIAGNOSIS — I25810 Atherosclerosis of coronary artery bypass graft(s) without angina pectoris: Secondary | ICD-10-CM

## 2010-12-12 NOTE — Progress Notes (Signed)
Subjective:    Patient ID: CLABORN JANUSZ, male    DOB: [birth year], 65 y.o.   MRN: [id number]  HPI  Here for wellness and f/u;  Overall doing ok;  Pt denies CP, worsening SOB, DOE, wheezing, orthopnea, PND, worsening LE edema, palpitations, dizziness or syncope.  Pt denies neurological change such as new Headache, facial or extremity weakness.  Pt denies polydipsia, polyuria, or low sugar symptoms. Pt states overall good compliance with treatment and medications, good tolerability, and trying to follow lower cholesterol diet.  Pt denies worsening depressive symptoms, suicidal ideation or panic. No fever, wt loss, night sweats, loss of appetite, or other constitutional symptoms.  Pt states good ability with ADL's, low fall risk, home safety reviewed and adequate, no significant changes in hearing or vision, and occasionally active with exercise. Past Medical History  Diagnosis Date  . Coronary atherosclerosis of unspecified type of vessel, native or graft   . Personal history of unspecified circulatory disease   . Occlusion and stenosis of carotid artery without mention of cerebral infarction   . Unspecified essential hypertension   . Other specified cardiac dysrhythmias   . Other and unspecified hyperlipidemia   . Obesity, unspecified   . Anxiety state, unspecified   . Depressive disorder, not elsewhere classified   . Esophageal reflux   . Wheezing   . Personal history of colonic polyps   . Routine general medical examination at a health care facility   . Acute sinusitis, unspecified   . Diaphragmatic hernia without mention of obstruction or gangrene   . Diverticulosis of colon (without mention of hemorrhage)   . Benign neoplasm of colon   . Personal history of diseases of skin and subcutaneous tissue   . Osteoarthrosis, unspecified whether generalized or localized, unspecified site   . Allergic rhinitis, cause unspecified   . Psychosexual dysfunction with inhibited sexual excitement   .  Hiatal hernia   . Arthritis    Past Surgical History  Procedure Date  . Lung biopsy   . Inguinal hernia repair   . Ptca   . Coronary artery bypass graft     x 2    reports that he has quit smoking. He has never used smokeless tobacco. He reports that he does not drink alcohol or use illicit drugs. family history includes Colon cancer in his sister. Allergies  Allergen Reactions  . Atorvastatin   . Simvastatin    Current Outpatient Prescriptions on File Prior to Visit  Medication Sig Dispense Refill  . aspirin 81 MG tablet Take 81 mg by mouth daily.        . B Complex Vitamins (VITAMIN B COMPLEX PO) Take 1 tablet by mouth daily.        . clopidogrel (PLAVIX) 75 MG tablet Take 75 mg by mouth daily.        . cyclobenzaprine (FLEXERIL) 5 MG tablet Take 1 tablet (5 mg total) by mouth 3 (three) times daily as needed for muscle spasms.  60 tablet  1  . lisinopril (PRINIVIL,ZESTRIL) 5 MG tablet Take 5 mg by mouth daily.        [name] lovastatin (MEVACOR) 40 MG tablet Take 1 tablet (40 mg total) by mouth daily.  90 tablet  3  . NEXIUM 40 MG capsule take 1 capsule by mouth once daily  90 capsule  2  . nitroGLYCERIN (NITROSTAT) 0.4 MG SL tablet Place 1 tablet (0.4 mg total) under the tongue every 5 (five) minutes as needed.  30  tablet  5  . Omega-3 Fatty Acids (FISH OIL) 1000 MG CAPS Take 1-2 capsules by mouth daily.        . traMADol (ULTRAM) 50 MG tablet Take 1 tablet (50 mg total) by mouth every 6 (six) hours as needed for pain.  120 tablet  2  . vardenafil (LEVITRA) 20 MG tablet Take 1 tablet (20 mg total) by mouth as needed for erectile dysfunction.  10 tablet  5  . vitamin E 400 UNIT capsule Take 400 Units by mouth daily.         Current Facility-Administered Medications on File Prior to Visit  Medication Dose Route Frequency Provider Last Rate Last Dose  . 0.9 %  sodium chloride infusion  500 mL Intravenous Continuous Yancey Flemings, MD       Review of Systems Review of Systems    Constitutional: Negative for diaphoresis, activity change, appetite change and unexpected weight change.  HENT: Negative for hearing loss, ear pain, facial swelling, mouth sores and neck stiffness.   Eyes: Negative for pain, redness and visual disturbance.  Respiratory: Negative for shortness of breath and wheezing.   Cardiovascular: Negative for chest pain and palpitations.  Gastrointestinal: Negative for diarrhea, blood in stool, abdominal distention and rectal pain.  Genitourinary: Negative for hematuria, flank pain and decreased urine volume.  Musculoskeletal: Negative for myalgias and joint swelling.  Skin: Negative for color change and wound.  Neurological: Negative for syncope and numbness.  Hematological: Negative for adenopathy.  Psychiatric/Behavioral: Negative for hallucinations, self-injury, decreased concentration and agitation.      Objective:   Physical Exam BP 110/70  Pulse 60  Temp(Src) 98 F (36.7 C) (Oral)  Ht 5\' 5"  (1.651 m)  Wt 161 lb 8 oz (73.256 kg)  BMI 26.88 kg/m2  SpO2 97% Physical Exam  VS noted Constitutional: Pt is oriented to person, place, and time. Appears well-developed and well-nourished.  HENT:  Head: Normocephalic and atraumatic.  Right Ear: External ear normal.  Left Ear: External ear normal.  Nose: Nose normal.  Mouth/Throat: Oropharynx is clear and moist.  Eyes: Conjunctivae and EOM are normal. Pupils are equal, round, and reactive to light.  Neck: Normal range of motion. Neck supple. No JVD present. No tracheal deviation present.  Cardiovascular: Normal rate, regular rhythm, normal heart sounds and intact distal pulses.   Pulmonary/Chest: Effort normal and breath sounds normal.  Abdominal: Soft. Bowel sounds are normal. There is no tenderness.  Musculoskeletal: Normal range of motion. Exhibits no edema.  Lymphadenopathy:  Has no cervical adenopathy.  Neurological: Pt is alert and oriented to person, place, and time. Pt has normal  reflexes. No cranial nerve deficit.  Skin: Skin is warm and dry. No rash noted.  Psychiatric:  Has  normal mood and affect. Behavior is normal.     Assessment & Plan:

## 2010-12-12 NOTE — Patient Instructions (Signed)
Continue all other medications as before You are otherwise up to date with prevention Please return in 6 mo with Lab testing done 3-5 days before

## 2010-12-12 NOTE — Assessment & Plan Note (Signed)

## 2010-12-13 ENCOUNTER — Ambulatory Visit: Payer: Medicare Other | Admitting: Internal Medicine

## 2010-12-17 ENCOUNTER — Encounter: Payer: Self-pay | Admitting: Internal Medicine

## 2011-02-28 ENCOUNTER — Other Ambulatory Visit: Payer: Self-pay | Admitting: Internal Medicine

## 2011-04-13 ENCOUNTER — Other Ambulatory Visit: Payer: Self-pay | Admitting: Internal Medicine

## 2011-04-13 MED ORDER — VARDENAFIL HCL 20 MG PO TABS: 20.0000 mg | ORAL_TABLET | ORAL | Status: DC | PRN

## 2011-04-13 MED ORDER — ESOMEPRAZOLE MAGNESIUM 40 MG PO CPDR: 40.0000 mg | DELAYED_RELEASE_CAPSULE | Freq: Every day | ORAL | Status: DC

## 2011-04-27 ENCOUNTER — Other Ambulatory Visit: Payer: Self-pay | Admitting: Internal Medicine

## 2011-06-03 ENCOUNTER — Other Ambulatory Visit: Payer: Self-pay | Admitting: Internal Medicine

## 2011-06-12 ENCOUNTER — Encounter: Payer: Self-pay | Admitting: Internal Medicine

## 2011-06-12 ENCOUNTER — Other Ambulatory Visit (INDEPENDENT_AMBULATORY_CARE_PROVIDER_SITE_OTHER): Payer: Medicare Other

## 2011-06-12 ENCOUNTER — Ambulatory Visit (INDEPENDENT_AMBULATORY_CARE_PROVIDER_SITE_OTHER): Payer: Medicare Other | Admitting: Internal Medicine

## 2011-06-12 VITALS — BP 110/72 | HR 56 | Temp 98.6°F | Ht 65.0 in | Wt 162.4 lb

## 2011-06-12 DIAGNOSIS — E785 Hyperlipidemia, unspecified: Secondary | ICD-10-CM

## 2011-06-12 DIAGNOSIS — Z0000 Encounter for general adult medical examination without abnormal findings: Secondary | ICD-10-CM

## 2011-06-12 DIAGNOSIS — F329 Major depressive disorder, single episode, unspecified: Secondary | ICD-10-CM

## 2011-06-12 DIAGNOSIS — I2510 Atherosclerotic heart disease of native coronary artery without angina pectoris: Secondary | ICD-10-CM

## 2011-06-12 DIAGNOSIS — I10 Essential (primary) hypertension: Secondary | ICD-10-CM

## 2011-06-12 LAB — LIPID PANEL
HDL: 52.4 mg/dL (ref >39.00)
Total CHOL/HDL Ratio: 3
VLDL: 18.6 mg/dL (ref 0.0–40.0)

## 2011-06-12 LAB — HEPATIC FUNCTION PANEL: Total Bilirubin: 0.4 mg/dL (ref 0.3–1.2)

## 2011-06-12 MED ORDER — VARDENAFIL HCL 20 MG PO TABS: 20.0000 mg | ORAL_TABLET | Freq: Every day | ORAL | Status: DC | PRN

## 2011-06-12 NOTE — Patient Instructions (Signed)
Continue all other medications as before Please go to LAB in the [facility]ement for the blood and/or urine tests to be done today You will be contacted by phone if any changes need to be made immediately.  Otherwise, you will receive a letter about your results with an explanation. You are given the refill today as requested Please keep your appointments with your specialists as you have planned - Dr Riley Kill Please return in 6 mo with Lab testing done 3-5 days before

## 2011-06-17 ENCOUNTER — Encounter: Payer: Self-pay | Admitting: Internal Medicine

## 2011-06-17 NOTE — Assessment & Plan Note (Signed)
stable overall by hx and exam, most recent data reviewed with pt, and pt to continue medical treatment as before BP Readings from Last 3 Encounters:  06/12/11 110/72  12/12/10 110/70  11/20/10 102/60

## 2011-06-17 NOTE — Assessment & Plan Note (Signed)
stable overall by hx and exam, most recent data reviewed with pt, and pt to continue medical treatment as before; goal ldl < 70 but has been intolerant of other statins Lab Results  Component Value Date   LDLCALC 67 06/12/2011

## 2011-06-17 NOTE — Progress Notes (Signed)
Subjective:    Patient ID: Brandon Rojas, male    DOB: 02-12-1945, 66 y.o.   MRN: [id number]  HPI  Here to f/u; overall doing ok,  Pt denies chest pain, increased sob or doe, wheezing, orthopnea, PND, increased LE swelling, palpitations, dizziness or syncope.  Pt denies new neurological symptoms such as new headache, or facial or extremity weakness or numbness   Pt denies polydipsia, polyuria,.  Pt states overall good compliance with meds, trying to follow lower cholesterol diet, wt overall stable but little exercise however.  Has been intolerant of most statins, not wanting to try another.   Pt denies fever, wt loss, night sweats, loss of appetite, or other constitutional symptoms  Denies worsening depressive symptoms, suicidal ideation, or panic. Past Medical History  Diagnosis Date  . Coronary atherosclerosis of unspecified type of vessel, native or graft   . Personal history of unspecified circulatory disease   . Occlusion and stenosis of carotid artery without mention of cerebral infarction   . Unspecified essential hypertension   . Other specified cardiac dysrhythmias   . Other and unspecified hyperlipidemia   . Obesity, unspecified   . Anxiety state, unspecified   . Depressive disorder, not elsewhere classified   . Esophageal reflux   . Wheezing   . Personal history of colonic polyps   . Routine general medical examination at a health care facility   . Acute sinusitis, unspecified   . Diaphragmatic hernia without mention of obstruction or gangrene   . Diverticulosis of colon (without mention of hemorrhage)   . Benign neoplasm of colon   . Personal history of diseases of skin and subcutaneous tissue   . Osteoarthrosis, unspecified whether generalized or localized, unspecified site   . Allergic rhinitis, cause unspecified   . Psychosexual dysfunction with inhibited sexual excitement   . Hiatal hernia   . Arthritis    Past Surgical History  Procedure Date  . Lung biopsy   .  Inguinal hernia repair   . Ptca   . Coronary artery bypass graft     x 2    reports that he has quit smoking. He has never used smokeless tobacco. He reports that he does not drink alcohol or use illicit drugs. family history includes Colon cancer in his sister. Allergies  Allergen Reactions  . Atorvastatin   . Simvastatin    Current Outpatient Prescriptions on File Prior to Visit  Medication Sig Dispense Refill  . aspirin 81 MG tablet Take 81 mg by mouth daily.        . B Complex Vitamins (VITAMIN B COMPLEX PO) Take 1 tablet by mouth daily.        . clopidogrel (PLAVIX) 75 MG tablet take 1 tablet by mouth once daily  90 tablet  3  . esomeprazole (NEXIUM) 40 MG capsule Take 1 capsule (40 mg total) by mouth daily.  90 capsule  3  . lisinopril (PRINIVIL,ZESTRIL) 5 MG tablet take 1 tablet by mouth once daily  90 tablet  3  . lovastatin (MEVACOR) 40 MG tablet take 1 tablet by mouth once daily  90 tablet  3  . nitroGLYCERIN (NITROSTAT) 0.4 MG SL tablet Place 1 tablet (0.4 mg total) under the tongue every 5 (five) minutes as needed.  30 tablet  5  . Omega-3 Fatty Acids (FISH OIL) 1000 MG CAPS Take 1-2 capsules by mouth daily.        . traMADol (ULTRAM) 50 MG tablet Take 1 tablet (50 mg total)  by mouth every 6 (six) hours as needed for pain.  120 tablet  2  . vardenafil (LEVITRA) 20 MG tablet Take 1 tablet (20 mg total) by mouth daily as needed for erectile dysfunction.  10 tablet  5  . vitamin E 400 UNIT capsule Take 400 Units by mouth daily.         Current Facility-Administered Medications on File Prior to Visit  Medication Dose Route Frequency Provider Last Rate Last Dose  . 0.9 %  sodium chloride infusion  500 mL Intravenous Continuous Hilarie Fredrickson, MD       Review of Systems Review of Systems  Constitutional: Negative for diaphoresis and unexpected weight change.  Eyes: Negative for photophobia and visual disturbance.  Respiratory: Negative for choking and stridor.     Gastrointestinal: Negative for vomiting and blood in stool.  Genitourinary: Negative for hematuria and decreased urine volume.  Musculoskeletal: Negative for gait problem.  Skin: Negative for color change and wound.  Neurological: Negative for tremors and numbness.     Objective:   Physical Exam BP 110/72  Pulse 56  Temp(Src) 98.6 F (37 C) (Oral)  Ht 5\' 5"  (1.651 m)  Wt 162 lb 6 oz (73.653 kg)  BMI 27.02 kg/m2  SpO2 95% Physical Exam  VS noted, not ill appearing Constitutional: Pt appears well-developed and well-nourished.  HENT: Head: Normocephalic.  Right Ear: External ear normal.  Left Ear: External ear normal.  Eyes: Conjunctivae and EOM are normal. Pupils are equal, round, and reactive to light.  Neck: Normal range of motion. Neck supple.  Cardiovascular: Normal rate and regular rhythm.   Pulmonary/Chest: Effort normal and breath sounds normal.  Neurological: Pt is alert. Not confused, motor/gait intact Skin: Skin is warm. No erythema.  Psychiatric: Pt behavior is normal. Thought content normal. Not depressed affect     Assessment & Plan:

## 2011-06-17 NOTE — Assessment & Plan Note (Signed)
stable overall by hx and exam, , and pt to continue medical treatment as before   

## 2011-06-17 NOTE — Assessment & Plan Note (Signed)
stable overall by hx and exam, most recent data reviewed with pt, and pt to continue medical treatment as before Lab Results  Component Value Date   WBC 6.8 12/05/2010   HGB 14.5 12/05/2010   HCT 43.0 12/05/2010   PLT 263.0 12/05/2010   GLUCOSE 90 12/05/2010   CHOL 138 06/12/2011   TRIG 93.0 06/12/2011   HDL 52.40 06/12/2011   LDLCALC 67 06/12/2011   ALT 16 06/12/2011   AST 20 06/12/2011   NA 142 12/05/2010   K 4.6 12/05/2010   CL 108 12/05/2010   CREATININE 1.2 12/05/2010   BUN 18 12/05/2010   CO2 26 12/05/2010   TSH 2.27 12/05/2010   PSA 1.21 12/05/2010   INR 1.0 03/06/2010

## 2011-06-25 ENCOUNTER — Other Ambulatory Visit: Payer: Self-pay | Admitting: Internal Medicine

## 2011-10-02 ENCOUNTER — Other Ambulatory Visit: Payer: Self-pay | Admitting: Internal Medicine

## 2011-10-02 NOTE — Telephone Encounter (Signed)
Done erx 

## 2011-10-19 ENCOUNTER — Encounter: Payer: Self-pay | Admitting: Internal Medicine

## 2011-11-13 ENCOUNTER — Ambulatory Visit (INDEPENDENT_AMBULATORY_CARE_PROVIDER_SITE_OTHER): Payer: Medicare Other | Admitting: Cardiology

## 2011-11-13 ENCOUNTER — Encounter: Payer: Self-pay | Admitting: Cardiology

## 2011-11-13 VITALS — BP 140/82 | HR 57 | Ht 64.0 in | Wt 160.0 lb

## 2011-11-13 DIAGNOSIS — I2510 Atherosclerotic heart disease of native coronary artery without angina pectoris: Secondary | ICD-10-CM

## 2011-11-13 DIAGNOSIS — E785 Hyperlipidemia, unspecified: Secondary | ICD-10-CM

## 2011-11-13 DIAGNOSIS — Z8679 Personal history of other diseases of the circulatory system: Secondary | ICD-10-CM

## 2011-11-13 NOTE — Assessment & Plan Note (Signed)
No current symptoms.  Feels good.  Continue medical therapy.  Had some sob, and doubt cardiac, but will do exercise GXT given time frame of CABG.

## 2011-11-13 NOTE — Assessment & Plan Note (Signed)
Last LDL was at target.   

## 2011-11-13 NOTE — Assessment & Plan Note (Addendum)
Has been on plavix ever since.  Will defer decisions, re: continuation of DAPT, to Dr. Jonny Ruiz.  No cardiac indications at present.

## 2011-11-13 NOTE — Patient Instructions (Addendum)
Your physician recommends that you continue on your current medications as directed. Please refer to the Current Medication list given to you today.  Your physician has requested that you have an exercise tolerance test. For further information please visit [email or web address]. Please also follow instruction sheet, as given.   

## 2011-11-13 NOTE — Progress Notes (Signed)
HPI:  Overall he is doing well.  He had some shortness of breath that he related to tramadol, and it seemed to get better.  He is on plavix for a 2004 TIA which sounded real at that time.  He is not having any chest pain.  He is otherwise ok.    Current Outpatient Prescriptions  Medication Sig Dispense Refill  . aspirin 81 MG tablet Take 81 mg by mouth daily.        . B Complex Vitamins (VITAMIN B COMPLEX PO) Take 1 tablet by mouth daily.        . clopidogrel (PLAVIX) 75 MG tablet take 1 tablet by mouth once daily  90 tablet  3  . esomeprazole (NEXIUM) 40 MG capsule Take 1 capsule (40 mg total) by mouth daily.  90 capsule  3  . lisinopril (PRINIVIL,ZESTRIL) 5 MG tablet take 1 tablet by mouth once daily  90 tablet  3  . lovastatin (MEVACOR) 40 MG tablet take 1 tablet by mouth once daily  90 tablet  3  . nitroGLYCERIN (NITROSTAT) 0.4 MG SL tablet Place 1 tablet (0.4 mg total) under the tongue every 5 (five) minutes as needed.  30 tablet  5  . Omega-3 Fatty Acids (FISH OIL) 1000 MG CAPS Take 1-2 capsules by mouth daily.        . traMADol (ULTRAM) 50 MG tablet take 1 tablet by mouth every 6 hours if needed for pain  120 tablet  2  . vardenafil (LEVITRA) 20 MG tablet Take 1 tablet (20 mg total) by mouth daily as needed for erectile dysfunction.  10 tablet  5  . vitamin E 400 UNIT capsule Take 400 Units by mouth daily.        . [DISCONTINUED] NEXIUM 40 MG capsule take 1 capsule by mouth once daily  90 capsule  3   Current Facility-Administered Medications  Medication Dose Route Frequency Provider Last Rate Last Dose  . [DISCONTINUED] 0.9 %  sodium chloride infusion  500 mL Intravenous Continuous Hilarie Fredrickson, MD        Allergies  Allergen Reactions  . Atorvastatin   . Simvastatin     Past Medical History  Diagnosis Date  . Coronary atherosclerosis of unspecified type of vessel, native or graft   . Personal history of unspecified circulatory disease   . Occlusion and stenosis of carotid  artery without mention of cerebral infarction   . Unspecified essential hypertension   . Other specified cardiac dysrhythmias   . Other and unspecified hyperlipidemia   . Obesity, unspecified   . Anxiety state, unspecified   . Depressive disorder, not elsewhere classified   . Esophageal reflux   . Wheezing   . Personal history of colonic polyps   . Routine general medical examination at a health care facility   . Acute sinusitis, unspecified   . Diaphragmatic hernia without mention of obstruction or gangrene   . Diverticulosis of colon (without mention of hemorrhage)   . Benign neoplasm of colon   . Personal history of diseases of skin and subcutaneous tissue   . Osteoarthrosis, unspecified whether generalized or localized, unspecified site   . Allergic rhinitis, cause unspecified   . Psychosexual dysfunction with inhibited sexual excitement   . Hiatal hernia   . Arthritis     Past Surgical History  Procedure Date  . Lung biopsy   . Inguinal hernia repair   . Ptca   . Coronary artery bypass graft  x 2    Family History  Problem Relation Age of Onset  . Colon cancer Sister     History   Social History  . Marital Status: Married    Spouse Name: N/A    Number of Children: 3  . Years of Education: N/A   Occupational History  . PASTOR    Social History Main Topics  . Smoking status: Former Games developer  . Smokeless tobacco: Never Used  . Alcohol Use: No  . Drug Use: No  . Sexually Active: Not on file   Other Topics Concern  . Not on file   Social History Narrative  . No narrative on file    ROS: Please see the HPI.  All other systems reviewed and negative.  PHYSICAL EXAM:  BP 140/82  Pulse 57  Ht 5\' 4"  (1.626 m)  Wt 160 lb (72.576 kg)  BMI 27.46 kg/m2  General: Well developed, well nourished, in no acute distress. Head:  Normocephalic and atraumatic. Neck: no JVD Lungs: Clear to auscultation and percussion. Heart: Normal S1 and S2.  No murmur, rubs  or gallops.  Abdomen:  Normal bowel sounds; soft; non tender; no organomegaly Pulses: Pulses normal in all 4 extremities. Extremities: No clubbing or cyanosis. No edema. Neurologic: Alert and oriented x 3.  EKG:  SB.  Nonspecific T abnormality. ASSESSMENT AND PLAN:

## 2011-12-10 ENCOUNTER — Other Ambulatory Visit (INDEPENDENT_AMBULATORY_CARE_PROVIDER_SITE_OTHER): Payer: Medicare Other

## 2011-12-10 DIAGNOSIS — Z125 Encounter for screening for malignant neoplasm of prostate: Secondary | ICD-10-CM

## 2011-12-10 DIAGNOSIS — E785 Hyperlipidemia, unspecified: Secondary | ICD-10-CM

## 2011-12-10 DIAGNOSIS — Z0000 Encounter for general adult medical examination without abnormal findings: Secondary | ICD-10-CM

## 2011-12-10 DIAGNOSIS — Z79899 Other long term (current) drug therapy: Secondary | ICD-10-CM

## 2011-12-10 LAB — CBC WITH DIFFERENTIAL/PLATELET
Basophils Absolute: 0.1 10*3/uL (ref 0.0–0.1)
Eosinophils Absolute: 0.5 10*3/uL (ref 0.0–0.7)
HCT: 43.8 % (ref 39.0–52.0)
Lymphocytes Relative: 21.6 % (ref 12.0–46.0)
Lymphs Abs: 1.3 10*3/uL (ref 0.7–4.0)
MCHC: 33.2 g/dL (ref 30.0–36.0)
Monocytes Relative: 9.2 % (ref 3.0–12.0)
Platelets: 267 10*3/uL (ref 150.0–400.0)
RDW: 14.3 % (ref 11.5–14.6)

## 2011-12-10 LAB — URINALYSIS, ROUTINE W REFLEX MICROSCOPIC
Hgb urine dipstick: NEGATIVE
Ketones, ur: NEGATIVE
Leukocytes, UA: NEGATIVE
Specific Gravity, Urine: 1.015 (ref 1.000–1.030)
Urine Glucose: NEGATIVE
Urobilinogen, UA: 0.2 (ref 0.0–1.0)

## 2011-12-10 LAB — BASIC METABOLIC PANEL
CO2: 28 mEq/L (ref 19–32)
Glucose, Bld: 101 mg/dL — ABNORMAL HIGH (ref 70–99)
Potassium: 4.4 mEq/L (ref 3.5–5.1)
Sodium: 141 mEq/L (ref 135–145)

## 2011-12-10 LAB — HEPATIC FUNCTION PANEL
AST: 20 U/L (ref 0–37)
Albumin: 3.5 g/dL (ref 3.5–5.2)
Alkaline Phosphatase: 60 U/L (ref 39–117)
Bilirubin, Direct: 0.1 mg/dL (ref 0.0–0.3)
Total Protein: 6.3 g/dL (ref 6.0–8.3)

## 2011-12-10 LAB — TSH: TSH: 3.43 u[IU]/mL (ref 0.35–5.50)

## 2011-12-11 ENCOUNTER — Ambulatory Visit (INDEPENDENT_AMBULATORY_CARE_PROVIDER_SITE_OTHER): Payer: Medicare Other | Admitting: Internal Medicine

## 2011-12-11 ENCOUNTER — Encounter: Payer: Self-pay | Admitting: Internal Medicine

## 2011-12-11 VITALS — BP 120/62 | HR 55 | Temp 97.5°F | Ht 65.0 in | Wt 158.5 lb

## 2011-12-11 DIAGNOSIS — Z0000 Encounter for general adult medical examination without abnormal findings: Secondary | ICD-10-CM

## 2011-12-11 DIAGNOSIS — Z8679 Personal history of other diseases of the circulatory system: Secondary | ICD-10-CM

## 2011-12-11 DIAGNOSIS — E785 Hyperlipidemia, unspecified: Secondary | ICD-10-CM

## 2011-12-11 MED ORDER — NITROGLYCERIN 0.4 MG SL SUBL: 0.4000 mg | SUBLINGUAL_TABLET | SUBLINGUAL | Status: DC | PRN

## 2011-12-11 MED ORDER — TRAMADOL HCL 50 MG PO TABS: 50.0000 mg | ORAL_TABLET | Freq: Four times a day (QID) | ORAL | Status: DC | PRN

## 2011-12-11 MED ORDER — SILDENAFIL CITRATE 100 MG PO TABS: 100.0000 mg | ORAL_TABLET | Freq: Every day | ORAL | Status: DC | PRN

## 2011-12-11 NOTE — Assessment & Plan Note (Addendum)
D/w pt - to cont plavix for now , and pt understands risk/benefit, and OK to hold plavix for 1 wk prior to colonoscopy soon

## 2011-12-11 NOTE — Assessment & Plan Note (Signed)
Overall doing well, age appropriate education and counseling updated, referrals for preventative services and immunizations addressed, dietary and smoking counseling addressed, most recent labs and ECG reviewed.  I have personally reviewed and have noted: 1) the patient's medical and social history 2) The pt's use of alcohol, tobacco, and illicit drugs 3) The patient's current medications and supplements 4) Functional ability including ADL's, fall risk, home safety risk, hearing and visual impairment 5) Diet and physical activities 6) Evidence for depression or mood disorder 7) The patient's height, weight, and BMI have been recorded in the chart I have made referrals, and provided counseling and education based on review of the above Also due for colonoscpoy f/u  With Dr Marina Goodell

## 2011-12-11 NOTE — Progress Notes (Signed)
Subjective:    Patient ID: Brandon Rojas, male    DOB: [birth year], 66 y.o.   MRN: [id number]  HPI  Here for wellness and f/u;  Overall doing ok;  Pt denies CP, worsening SOB, DOE, wheezing, orthopnea, PND, worsening LE edema, palpitations, dizziness or syncope.  Pt denies neurological change such as new Headache, facial or extremity weakness.  Pt denies polydipsia, polyuria, or low sugar symptoms. Pt states overall good compliance with treatment and medications, good tolerability, and trying to follow lower cholesterol diet.  Pt denies worsening depressive symptoms, suicidal ideation or panic. No fever, wt loss, night sweats, loss of appetite, or other constitutional symptoms.  Pt states good ability with ADL's, low fall risk, home safety reviewed and adequate, no significant changes in hearing or vision, and occasionally active with exercise.  Due for colonoscopy, declines flu shot.  No acute complaints.  Scheduled for stress test later this wk per Dr Riley Kill.  No overt bleeding or bruising on the plavix.  Pt continues to have recurring LBP without change in severity, bowel or bladder change, fever, wt loss,  worsening LE pain/numbness/weakness, gait change or falls, as well recurring bilat knee pain, asks for tramadol refill.  Needs levitra change to try viagra as well, and states knows very well about the strict prohibition of taking ntg with viagra in the same 24 hrs Past Medical History  Diagnosis Date  . Coronary atherosclerosis of unspecified type of vessel, native or graft   . Personal history of unspecified circulatory disease   . Occlusion and stenosis of carotid artery without mention of cerebral infarction   . Unspecified essential hypertension   . Other specified cardiac dysrhythmias   . Other and unspecified hyperlipidemia   . Obesity, unspecified   . Anxiety state, unspecified   . Depressive disorder, not elsewhere classified   . Esophageal reflux   . Wheezing   . Personal history of  colonic polyps   . Routine general medical examination at a health care facility   . Acute sinusitis, unspecified   . Diaphragmatic hernia without mention of obstruction or gangrene   . Diverticulosis of colon (without mention of hemorrhage)   . Benign neoplasm of colon   . Personal history of diseases of skin and subcutaneous tissue   . Osteoarthrosis, unspecified whether generalized or localized, unspecified site   . Allergic rhinitis, cause unspecified   . Psychosexual dysfunction with inhibited sexual excitement   . Hiatal hernia   . Arthritis    Past Surgical History  Procedure Date  . Lung biopsy   . Inguinal hernia repair   . Ptca   . Coronary artery bypass graft     x 2    reports that he has quit smoking. He has never used smokeless tobacco. He reports that he does not drink alcohol or use illicit drugs. family history includes Colon cancer in his sister. Allergies  Allergen Reactions  . Atorvastatin   . Simvastatin    Current Outpatient Prescriptions on File Prior to Visit  Medication Sig Dispense Refill  . aspirin 81 MG tablet Take 81 mg by mouth daily.        . B Complex Vitamins (VITAMIN B COMPLEX PO) Take 1 tablet by mouth daily.        . clopidogrel (PLAVIX) 75 MG tablet take 1 tablet by mouth once daily  90 tablet  3  . esomeprazole (NEXIUM) 40 MG capsule Take 1 capsule (40 mg total) by mouth daily.  90 capsule  3  . lisinopril (PRINIVIL,ZESTRIL) 5 MG tablet take 1 tablet by mouth once daily  90 tablet  3  . lovastatin (MEVACOR) 40 MG tablet take 1 tablet by mouth once daily  90 tablet  3  . Omega-3 Fatty Acids (FISH OIL) 1000 MG CAPS Take 1-2 capsules by mouth daily.        . vitamin E 400 UNIT capsule Take 400 Units by mouth daily.        . [DISCONTINUED] nitroGLYCERIN (NITROSTAT) 0.4 MG SL tablet Place 1 tablet (0.4 mg total) under the tongue every 5 (five) minutes as needed.  30 tablet  5  . sildenafil (VIAGRA) 100 MG tablet Take 1 tablet (100 mg total) by  mouth daily as needed for erectile dysfunction.  10 tablet  11   Review of Systems Review of Systems  Constitutional: Negative for diaphoresis, activity change, appetite change and unexpected weight change.  HENT: Negative for hearing loss, ear pain, facial swelling, mouth sores and neck stiffness.   Eyes: Negative for pain, redness and visual disturbance.  Respiratory: Negative for shortness of breath and wheezing.   Cardiovascular: Negative for chest pain and palpitations.  Gastrointestinal: Negative for diarrhea, blood in stool, abdominal distention and rectal pain.  Genitourinary: Negative for hematuria, flank pain and decreased urine volume.  Musculoskeletal: Negative for myalgias and joint swelling.  Skin: Negative for color change and wound.  Neurological: Negative for syncope and numbness.  Hematological: Negative for adenopathy.  Psychiatric/Behavioral: Negative for hallucinations, self-injury, decreased concentration and agitation.      Objective:   Physical Exam BP 120/62  Pulse 55  Temp 97.5 F (36.4 C) (Oral)  Ht 5\' 5"  (1.651 m)  Wt 158 lb 8 oz (71.895 kg)  BMI 26.38 kg/m2  SpO2 95% Physical Exam  VS noted Constitutional: Pt is oriented to person, place, and time. Appears well-developed and well-nourished.  HENT:  Head: Normocephalic and atraumatic.  Right Ear: External ear normal.  Left Ear: External ear normal.  Nose: Nose normal.  Mouth/Throat: Oropharynx is clear and moist.  Eyes: Conjunctivae and EOM are normal. Pupils are equal, round, and reactive to light.  Neck: Normal range of motion. Neck supple. No JVD present. No tracheal deviation present.  Cardiovascular: Normal rate, regular rhythm, normal heart sounds and intact distal pulses.   Pulmonary/Chest: Effort normal and breath sounds normal.  Abdominal: Soft. Bowel sounds are normal. There is no tenderness.  Musculoskeletal: Normal range of motion. Exhibits no edema.  Lymphadenopathy:  Has no cervical  adenopathy.  Neurological: Pt is alert and oriented to person, place, and time. Pt has normal reflexes. No cranial nerve deficit.  Skin: Skin is warm and dry. No rash noted.  Psychiatric:  Has  normal mood and affect. Behavior is normal.     Assessment & Plan:

## 2011-12-11 NOTE — Patient Instructions (Addendum)
Take all new medications as prescribed - the viagra instead of the levitra Continue all other medications as before, including the plavix Your refills were done as requested Please have the pharmacy call with any other refills you may need. Please continue your efforts at being more active, low cholesterol diet, and weight control. Thank you for enrolling in MyChart. Please follow the instructions below to securely access your online medical record. MyChart allows you to send messages to your doctor, view your test results, renew your prescriptions, schedule appointments, and more. To Log into MyChart, please go to [email or web address], and your Username is:  Brandon Rojas You will be contacted regarding the referral for: colonoscopy with Dr Rae Halsted are otherwise up to date with prevention measures Please return in 6 mo with Lab testing done 3-5 days before

## 2011-12-11 NOTE — Assessment & Plan Note (Signed)
stable overall by hx and exam, most recent data reviewed with pt, and pt to continue medical treatment as before Lab Results  Component Value Date   LDLCALC 82 12/10/2011

## 2011-12-14 ENCOUNTER — Ambulatory Visit (INDEPENDENT_AMBULATORY_CARE_PROVIDER_SITE_OTHER): Payer: Medicare Other | Admitting: Cardiology

## 2011-12-14 DIAGNOSIS — I2510 Atherosclerotic heart disease of native coronary artery without angina pectoris: Secondary | ICD-10-CM

## 2011-12-14 MED ORDER — LISINOPRIL 10 MG PO TABS: 10.0000 mg | ORAL_TABLET | Freq: Every day | ORAL | Status: DC

## 2011-12-14 NOTE — Patient Instructions (Signed)
Your physician has recommended you make the following change in your medication: INCREASE Lisinopril to 10mg  one by mouth daily  Your physician recommends that you schedule a follow-up appointment in: 4 WEEKS with Dr Riley Kill

## 2011-12-14 NOTE — Progress Notes (Signed)
Patient ID: Brandon Rojas, male   DOB: 1945/04/02, 66 y.o.   MRN: [id number] Exercise Treadmill Test  Pre-Exercise Testing Evaluation Rhythm: normal sinus  Rate: 52   PR:  .12 QRS:  .09  QT:  .40 QTc: .38     Test  Exercise Tolerance Test Ordering MD: Shawnie Pons, MD  Interpreting MD: Shawnie Pons, MD  Unique Test No: 1  Treadmill:  1  Indication for ETT: known ASHD  Contraindication to ETT: No   Stress Modality: exercise - treadmill  Cardiac Imaging Performed: non   Protocol: standard Bruce - maximal  Max BP:  199/71  Max MPHR (bpm):  153 85% MPR (bpm):  130  MPHR obtained (bpm):  110 % MPHR obtained:  72%  Reached 85% MPHR (min:sec):  NA Total Exercise Time (min-sec):  9:00  Workload in METS:  10 Borg Scale: 15  Reason ETT Terminated:  angina chest pain    ST Segment Analysis At Rest: normal ST segments - no evidence of significant ST depression With Exercise: significant ischemic ST depression  Other Information Arrhythmia:  No Angina during ETT:  present (1) Quality of ETT:  diagnostic  ETT Interpretation:  abnormal - evidence of ST depression consistent with ischemia  Comments: The patient exercised on the Bruce. He experienced SSCP with exertion and 2mm of horizontal ST depression which resolved in recovery with the angina.  This represents an abnormal response to exercise with exercise induced chest pain and ST depression.    Recommendations: The patient does not have any change in pattern from one year earlier.  At that time cath showed:  ANGIOGRAPHIC DATA:  1. The left main is free of critical disease.  2. The LAD is diffusely irregular proximally and diffusely plaqued.  Beyond this, there is a stent previously placed leading into a  smaller caliber diagonal with about 60% proximal narrowing. The  stent has about 40% in-stent re-narrowing. After the takeoff of  the small diagonal, there is an 80% stenosis and had evidence of  competitive filling distally.  3.  The internal mammary to the distal LAD is widely patent, fills the  LAD retrograde.  4. The circumflex is a dominant vessel. There is 80% narrowing prior  to the first tiny marginal branch. The first marginal branch has  70% narrowing proximally and is only about 1.4-mm caliber vessel.  The circumflex in this territory is somewhat diffusely diseased and  then opens back up and has about 70% narrowing distally.  5. The saphenous vein graft to the distal posterolateral branch is  widely patent without significant narrowing and retrograde fills  the posterolateral branch into the PDA.  6. The right coronary artery is small dominant vessel without critical  disease.  7. Ventriculography in the RAO projection reveals well-preserved  global systolic function.  CONCLUSION:  1. Well-preserved left ventricular function.  2. Continued patency of internal mammary to the LAD.  3. Continued patency of the saphenous vein graft to the posterolateral  branch.  4. Small-caliber disease involving the first diagonal and first  marginal that do not appear to be optimal for percutaneous  intervention.   He does get hypertensive with exercise and his BP may not be optimally controlled.  We will increase his lisinopril and I will see him back in four weeks.  He insists he is the same as a year ago, so a continued medical strategy will be employed.  Explained in detail to patient.

## 2011-12-14 NOTE — Progress Notes (Signed)
Patient ID: Brandon Rojas, male   DOB: 1945/01/18, 66 y.o.   MRN: [id number] After doing a gxt on this patient Dr Riley Kill change his lisinopril to 10 mg and appointment for early jan 2014

## 2011-12-26 ENCOUNTER — Encounter: Payer: Self-pay | Admitting: Internal Medicine

## 2011-12-26 ENCOUNTER — Telehealth: Payer: Self-pay

## 2011-12-26 ENCOUNTER — Ambulatory Visit (INDEPENDENT_AMBULATORY_CARE_PROVIDER_SITE_OTHER): Payer: Medicare Other | Admitting: Internal Medicine

## 2011-12-26 VITALS — BP 110/62 | HR 68 | Ht 65.0 in | Wt 158.8 lb

## 2011-12-26 DIAGNOSIS — Z86010 Personal history of colonic polyps: Secondary | ICD-10-CM

## 2011-12-26 DIAGNOSIS — I2510 Atherosclerotic heart disease of native coronary artery without angina pectoris: Secondary | ICD-10-CM

## 2011-12-26 DIAGNOSIS — K219 Gastro-esophageal reflux disease without esophagitis: Secondary | ICD-10-CM

## 2011-12-26 DIAGNOSIS — K222 Esophageal obstruction: Secondary | ICD-10-CM

## 2011-12-26 MED ORDER — MOVIPREP 100 G PO SOLR: 1.0000 | Freq: Once | ORAL | Status: DC

## 2011-12-26 NOTE — Patient Instructions (Addendum)
You have been scheduled for a colonoscopy with propofol. Please follow written instructions given to you at your visit today.  Please pick up your prep kit at the pharmacy within the next 1-3 days. If you use inhalers (even only as needed) or a CPAP machine, please bring them with you on the day of your procedure.  

## 2011-12-26 NOTE — Progress Notes (Signed)
HISTORY OF PRESENT ILLNESS:  Brandon Rojas is a 66 y.o. male with multiple significant medical problems as listed below. He has a history of coronary artery disease and is on aspirin and Plavix therapy post coronary artery bypass grafting. Patient has a history of GERD complicated by peptic stricture for which she underwent upper endoscopy with esophageal dilation last year. With the approval of his cardiologist, he was off Plavix at that time. He has had no interval medical problems. Recent adjustment and blood pressure medicine. Chronic problems are stable. He takes Nexium for GERD. No active symptoms on PPI. No recurrent dysphagia. He is pleased. He denies lower GI complaints. He has had previous colonoscopy in 2003 in 2008 with tubular adenomas and diverticulosis. Review of outside laboratories from 12/10/2011 finds a unremarkable CBC and comprehensive metabolic panel.  REVIEW OF SYSTEMS:  All non-GI ROS negative   Past Medical History  Diagnosis Date  . Coronary atherosclerosis of unspecified type of vessel, native or graft   . Personal history of unspecified circulatory disease   . Occlusion and stenosis of carotid artery without mention of cerebral infarction   . Unspecified essential hypertension   . Other specified cardiac dysrhythmias   . Other and unspecified hyperlipidemia   . Obesity, unspecified   . Anxiety state, unspecified   . Depressive disorder, not elsewhere classified   . Esophageal reflux   . Wheezing   . Personal history of colonic polyps   . Routine general medical examination at a health care facility   . Acute sinusitis, unspecified   . Diaphragmatic hernia without mention of obstruction or gangrene   . Diverticulosis of colon (without mention of hemorrhage)   . Benign neoplasm of colon   . Personal history of diseases of skin and subcutaneous tissue   . Osteoarthrosis, unspecified whether generalized or localized, unspecified site   . Allergic rhinitis, cause  unspecified   . Psychosexual dysfunction with inhibited sexual excitement   . Hiatal hernia   . Arthritis   . Esophageal stricture     Past Surgical History  Procedure Date  . Lung biopsy   . Inguinal hernia repair   . Ptca   . Coronary artery bypass graft     x 2    Social History Brandon Rojas  reports that he has quit smoking. He has never used smokeless tobacco. He reports that he does not drink alcohol or use illicit drugs.  family history includes Colon cancer in his sister.  Allergies  Allergen Reactions  . Atorvastatin   . Simvastatin        PHYSICAL EXAMINATION: Vital signs: BP 110/62  Pulse 68  Ht 5\' 5"  (1.651 m)  Wt 158 lb 12.8 oz (72.031 kg)  BMI 26.43 kg/m2 General: Well-developed, well-nourished, no acute distress HEENT: Sclerae are anicteric, conjunctiva pink. Oral mucosa intact Lungs: Clear Heart: Regular Abdomen: soft, nontender, nondistended, no obvious ascites, no peritoneal signs, normal bowel sounds. No organomegaly. Extremities: No edema Psychiatric: alert and oriented x3. Cooperative      ASSESSMENT:  #1. GERD complicated by peptic stricture. Asymptomatic post dilation on PPI #2. History of adenomatous colon polyps. Due for surveillance #3. Multiple medical problems, stable. On Plavix therapy   PLAN:  #1. Continue reflux precautions #2. Continue PPI #3. Surveillance colonoscopy.The nature of the procedure, as well as the risks, benefits, and alternatives were carefully and thoroughly reviewed with the patient. Ample time for discussion and questions allowed. The patient understood, was satisfied, and agreed  to proceed.  #4. Movi prep prescribed. The patient instructed on its use #5. Hold Plavix 5 days prior to the procedure. Continue on aspirin. We will run this by his cardiologist for formal approval.

## 2011-12-26 NOTE — Telephone Encounter (Signed)
  12/26/2011    RE: Brandon Rojas DOB: 01/05/1946 MRN: [id number]   Dear Dr. Riley Kill,    We have scheduled the above patient for an endoscopic procedure. Our records show that he is on anticoagulation therapy.   Please advise as to how long the patient may come off his therapy of Plavix prior to the procedure, which is scheduled for 02-08-12.  Please fax back/ or route the completed form to [facility] at [phone].   Sincerely,  Gracy Racer

## 2012-01-16 ENCOUNTER — Ambulatory Visit: Payer: Medicare Other | Admitting: Cardiology

## 2012-01-16 ENCOUNTER — Ambulatory Visit (INDEPENDENT_AMBULATORY_CARE_PROVIDER_SITE_OTHER): Payer: Medicare Other | Admitting: Physician Assistant

## 2012-01-16 ENCOUNTER — Encounter: Payer: Self-pay | Admitting: Physician Assistant

## 2012-01-16 VITALS — BP 130/71 | HR 60 | Ht 65.0 in | Wt 161.0 lb

## 2012-01-16 DIAGNOSIS — I2510 Atherosclerotic heart disease of native coronary artery without angina pectoris: Secondary | ICD-10-CM

## 2012-01-16 DIAGNOSIS — I10 Essential (primary) hypertension: Secondary | ICD-10-CM

## 2012-01-16 DIAGNOSIS — I498 Other specified cardiac arrhythmias: Secondary | ICD-10-CM

## 2012-01-16 NOTE — Assessment & Plan Note (Signed)
Stable

## 2012-01-16 NOTE — Assessment & Plan Note (Signed)
Patient has history of CABG in 2006. Last catheter in 2012 showed patent LIMA to the LAD and patent SVG to the PLA with small-caliber disease the first diagonal and first marginal not optimal for percutaneous intervention. Patient did have an abnormal stress test in December 2013 and is being treated medically. His symptoms have not changed in the past year. His lisinopril was increased because of the hypertensive response to exercise. He is doing well now without any chest pain in the past month. I discussed this patient with Drs. Bonnee Quin who wants to continue medical therapy.

## 2012-01-16 NOTE — Progress Notes (Signed)
HPI:   This is a 67 year old white male patient of Dr. Nancy Marus who has a history of coronary artery disease status post CABG in 2006. He is also on Plavix from a TIA in 2004. A stress test was performed on 12/66/13 in which the patient had chest pain and 2 mm horizontal ST depression that resolved in recovery. Dr. Tedra Senegal carefully reviewed his cardiac catheterization from 2012 in which he had a patent LIMA to the LAD, patent SVG to the PLA, and a small-caliber disease involving the first diagonal and first marginal that are not optimal for percutaneous intervention. He had good LV function. He did have a hypertensive response to exercise and Dr. Tedra Senegal chose to increase his lisinopril and have him followup. The patient did insist that his symptoms have not changed in the past year.  The patient has done well since he was here a month ago. He denies any chest pain, palpitations, dyspnea, dyspnea on exertion, dizziness, or presyncope. He states he has more energy since the lisinopril was increased. He has walked on his treadmill at home without symptoms.  Allergies:  -- Atorvastatin   -- Simvastatin   Current Outpatient Prescriptions on File Prior to Visit: aspirin 81 MG tablet, Take 81 mg by mouth daily.  , Disp: , Rfl:  B Complex Vitamins (VITAMIN B COMPLEX PO), Take 1 tablet by mouth daily.  , Disp: , Rfl:  clopidogrel (PLAVIX) 75 MG tablet, take 1 tablet by mouth once daily, Disp: 90 tablet, Rfl: 3 esomeprazole (NEXIUM) 40 MG capsule, Take 1 capsule (40 mg total) by mouth daily., Disp: 90 capsule, Rfl: 3 lisinopril (PRINIVIL,ZESTRIL) 10 MG tablet, Take 1 tablet (10 mg total) by mouth daily., Disp: 90 tablet, Rfl: 3 lovastatin (MEVACOR) 40 MG tablet, take 1 tablet by mouth once daily, Disp: 90 tablet, Rfl: 3 MOVIPREP 100 G SOLR, Take 1 kit (100 g total) by mouth once., Disp: 1 kit, Rfl: 0 nitroGLYCERIN (NITROSTAT) 0.4 MG SL tablet, Place 1 tablet (0.4 mg total) under the tongue every 5 (five)  minutes as needed., Disp: 30 tablet, Rfl: 5 Omega-3 Fatty Acids (FISH OIL) 1000 MG CAPS, Take 1-2 capsules by mouth daily.  , Disp: , Rfl:  sildenafil (VIAGRA) 100 MG tablet, Take 1 tablet (100 mg total) by mouth daily as needed for erectile dysfunction., Disp: 10 tablet, Rfl: 11 traMADol (ULTRAM) 50 MG tablet, Take 1 tablet (50 mg total) by mouth every 6 (six) hours as needed for pain., Disp: 120 tablet, Rfl: 3 vitamin E 400 UNIT capsule, Take 400 Units by mouth daily.  , Disp: , Rfl:     Past Medical History:   Coronary atherosclerosis of unspecified type o*              Personal history of unspecified circulatory di*              Occlusion and stenosis of carotid artery witho*              Unspecified essential hypertension                           Other specified cardiac dysrhythmias                         Other and unspecified hyperlipidemia                         Obesity, unspecified  Anxiety state, unspecified                                   Depressive disorder, not elsewhere classified                Esophageal reflux                                            Wheezing                                                     Personal history of colonic polyps                           Routine general medical examination at a healt*              Acute sinusitis, unspecified                                 Diaphragmatic hernia without mention of obstru*              Diverticulosis of colon (without mention of he*              Benign neoplasm of colon                                     Personal history of diseases of skin and subcu*              Osteoarthrosis, unspecified whether generalize*              Allergic rhinitis, cause unspecified                         Psychosexual dysfunction with inhibited sexual*              Hiatal hernia                                                Arthritis                                                     Esophageal stricture                                        Past Surgical History:   LUNG BIOPSY  INGUINAL HERNIA REPAIR                                       PTCA                                                         CORONARY ARTERY BYPASS GRAFT                                   Comment:x 2  Review of patient's family history indicates:   Colon cancer                   Sister                   Social History   Marital Status: Married             Spouse Name:                      Years of Education:                 Number of children: 3           Occupational History Occupation          Geologist, engineering                                    Social History Main Topics   Smoking Status: Former Smoker                   Packs/Day:       Years:         Smokeless Status: Never Used                       Alcohol Use: No             Drug Use: No             Sexual Activity: Not on file        Other Topics            Concern   None on file  Social History Narrative   None on file    ROS:see history of present illness otherwise negative   PHYSICAL EXAM: Well-nournished, in no acute distress. Neck: No JVD, HJR, Bruit, or thyroid enlargement  Lungs: No tachypnea, clear without wheezing, rales, or rhonchi  Cardiovascular: RRR, PMI not displaced, positive S4, no murmur, bruit, thrill, or heave.  Abdomen: BS normal. Soft without organomegaly, masses, lesions or tenderness.  Extremities: without cyanosis, clubbing or edema. Good distal pulses bilateral  SKin: Warm, no lesions or rashes   Musculoskeletal: No deformities  Neuro: no focal signs  BP 130/71  Pulse 60  Ht 5\' 5"  (1.651 m)  Wt 161 lb (73.029 kg)  BMI 26.79 kg/m2   WJX:BJYNWG sinus rhythm inferior Q waves nonspecific ST-T wave changes  Greta Doom [facility]  12/14/2011 10:39 AM  Signed  Patient ID: Nena Jordan, male   DOB:  Feb 04, 1945, 67 y.o.   MRN: [id number] Exercise Treadmill Test  Pre-Exercise Testing Evaluation Rhythm: normal sinus   Rate: 52   PR:  .12  QRS:  .09   QT:  .40  QTc: .38     Test  Exercise Tolerance Test Ordering MD: Shawnie Pons, MD   Interpreting MD: Shawnie Pons, MD   Unique Test No: 1   Treadmill:  1   Indication for ETT: known ASHD   Contraindication to ETT: No    Stress Modality: exercise - treadmill   Cardiac Imaging Performed: non    Protocol: standard Bruce - maximal   Max BP:  199/71   Max MPHR (bpm):  153  85% MPR (bpm):  130   MPHR obtained (bpm):  110  % MPHR obtained:  72%   Reached 85% MPHR (min:sec):  NA  Total Exercise Time (min-sec):  9:00   Workload in METS:  10  Borg Scale: 15   Reason ETT Terminated:  angina chest pain      ST Segment Analysis At Rest:           normal ST segments - no evidence of significant ST depression With Exercise:            significant ischemic ST depression  Other Information Arrhythmia:  No           Angina during ETT:  present (1) Quality of ETT:  diagnostic  ETT Interpretation:  abnormal - evidence of ST depression consistent with ischemia  Comments: The patient exercised on the Bruce. He experienced SSCP with exertion and 2mm of horizontal ST depression which resolved in recovery with the angina.  This represents an abnormal response to exercise with exercise induced chest pain and ST depression.    Recommendations: The patient does not have any change in pattern from one year earlier.  At that time cath showed:  ANGIOGRAPHIC DATA:   1. The left main is free of critical disease.   2. The LAD is diffusely irregular proximally and diffusely plaqued.   Beyond this, there is a stent previously placed leading into a   smaller caliber diagonal with about 60% proximal narrowing. The   stent has about 40% in-stent re-narrowing. After the takeoff of   the small diagonal, there is an 80% stenosis and had evidence of   competitive  filling distally.   3. The internal mammary to the distal LAD is widely patent, fills the   LAD retrograde.   4. The circumflex is a dominant vessel. There is 80% narrowing prior   to the first tiny marginal branch. The first marginal branch has   70% narrowing proximally and is only about 1.4-mm caliber vessel.   The circumflex in this territory is somewhat diffusely diseased and   then opens back up and has about 70% narrowing distally.   5. The saphenous vein graft to the distal posterolateral branch is   widely patent without significant narrowing and retrograde fills   the posterolateral branch into the PDA.   6. The right coronary artery is small dominant vessel without critical   disease.   7. Ventriculography in the RAO projection reveals well-preserved   global systolic function.   CONCLUSION:   1. Well-preserved left ventricular function.   2. Continued patency of internal mammary to the LAD.   3. Continued patency of the saphenous vein  graft to the posterolateral   branch.   4. Small-caliber disease involving the first diagonal and first   marginal that do not appear to be optimal for percutaneous   intervention.   He does get hypertensive with exercise and his BP may not be optimally controlled.  We will increase his lisinopril and I will see him back in four weeks.  He insists he is the same as a year ago, so a continued medical strategy will be employed.  Explained in detail to patient.

## 2012-01-16 NOTE — Patient Instructions (Addendum)
**Note De-identified  Obfuscation** Your physician recommends that you continue on your current medications as directed. Please refer to the Current Medication list given to you today.  Your physician wants you to follow-up in: 3-4 months You will receive a reminder letter in the mail two months in advance. If you don't receive a letter, please call our office to schedule the follow-up appointment.  

## 2012-01-26 ENCOUNTER — Encounter: Payer: Self-pay | Admitting: Internal Medicine

## 2012-01-29 ENCOUNTER — Telehealth: Payer: Self-pay

## 2012-01-29 NOTE — Telephone Encounter (Signed)
Left message for Dr. Rosalyn Charters nurse following up on lack of response to anticoagulation letter sent 12-25-12.  Receptionist told me she would have his nurse call me back

## 2012-02-01 ENCOUNTER — Other Ambulatory Visit: Payer: Self-pay

## 2012-02-01 ENCOUNTER — Telehealth: Payer: Self-pay

## 2012-02-01 NOTE — Telephone Encounter (Signed)
Please review note from 05/02/10.  Will need to address with primary care.

## 2012-02-01 NOTE — Telephone Encounter (Signed)
02/26/2011    RE: JET ARMBRUST DOB: 04/18/1945 MRN: [id number]   Dear Dr. Jonny Ruiz,    We have scheduled the above patient for an endoscopic procedure. Our records show that he is on anticoagulation therapy.   Please advise as to how long the patient may come off his therapy of Plavix prior to the procedure, which is scheduled for 02-08-12.  Please fax back/ or route the completed form to [place] at [phone].   Sincerely,  Gracy Racer

## 2012-02-01 NOTE — Telephone Encounter (Signed)
Ok for off plavix 3 days prior to procedure

## 2012-02-04 ENCOUNTER — Telehealth: Payer: Self-pay

## 2012-02-04 NOTE — Telephone Encounter (Signed)
Message from Jeanine Luz, CMA sent at 02/01/2012 2:49 PM ----- I originally sent this to Dr. Riley Kill before being informed that you were the one handling his Plavix. Please respond as soon as possible as the patient's procedure is 02-08-12.    Oliver Barre, MD 02/01/2012 5:35 PM Signed  Ok for off plavix 3 days prior to procedure Gracy Racer, CMA 02/01/2012 2:49 PM Signed  02/26/2011  RE: Brandon Rojas  DOB: March 18, 1945  MRN: [id number]  Dear Dr. Jonny Ruiz,  We have scheduled the above patient for an endoscopic procedure. Our records show that he is on anticoagulation therapy.  Please advise as to how long the patient may come off his therapy of Plavix prior to the procedure, which is scheduled for 02-08-12.  Please fax back/ or route the completed form to [facility] at [phone].  Sincerely,  Gracy Racer

## 2012-02-04 NOTE — Telephone Encounter (Signed)
Spoke with Brandon Rojas in Dr. Raphael Gibney office to follow up on Plavix clearance.  She stated Dr. Jonny Rojas said it was ok to hold Plavix 3 days prior.  She agreed to fax this answer over to me.  I then called Brandon Rojas and instructed him to stop his Plavix.  Patient stated he had stopped the day before.

## 2012-02-08 ENCOUNTER — Ambulatory Visit (AMBULATORY_SURGERY_CENTER): Payer: Medicare Other | Admitting: Internal Medicine

## 2012-02-08 ENCOUNTER — Encounter: Payer: Self-pay | Admitting: Internal Medicine

## 2012-02-08 VITALS — BP 148/72 | HR 52 | Temp 97.8°F | Resp 20 | Ht 65.0 in | Wt 158.0 lb

## 2012-02-08 DIAGNOSIS — Z1211 Encounter for screening for malignant neoplasm of colon: Secondary | ICD-10-CM

## 2012-02-08 DIAGNOSIS — Z86010 Personal history of colonic polyps: Secondary | ICD-10-CM

## 2012-02-08 MED ORDER — SODIUM CHLORIDE 0.9 % IV SOLN: 500.0000 mL | INTRAVENOUS | Status: DC

## 2012-02-08 NOTE — Patient Instructions (Addendum)

## 2012-02-08 NOTE — Progress Notes (Signed)
Patient did not experience any of the following events: a burn prior to discharge; a fall within the facility; wrong site/side/patient/procedure/implant event; or a hospital transfer or hospital admission upon discharge from the facility. (G8907) Patient did not have preoperative order for IV antibiotic SSI prophylaxis. (G8918)  

## 2012-02-08 NOTE — Op Note (Signed)
[facility] [address] [place], [address]   COLONOSCOPY PROCEDURE REPORT  PATIENT: Brandon Rojas, Brandon Rojas  MR#: [id number] BIRTHDATE: [birth year] , 66  yrs. old GENDER: Male ENDOSCOPIST: Roxy Cedar, MD REFERRED NW:[id number] Program Recall PROCEDURE DATE:  02/08/2012 PROCEDURE:   Colonoscopy, surveillance ASA CLASS:   Class II INDICATIONS:Patient's personal history of adenomatous colon polyps. Index 2003; f/u 2008 (multiple adenomas) MEDICATIONS: MAC sedation, administered by CRNA and propofol (Diprivan) 170mg  IV  DESCRIPTION OF PROCEDURE:   After the risks benefits and alternatives of the procedure were thoroughly explained, informed consent was obtained.  A digital rectal exam revealed no abnormalities of the rectum.   The LB [id number]  endoscope was introduced through the anus and advanced to the cecum, which was identified by both the appendix and ileocecal valve. No adverse events experienced.   The quality of the prep was adequate, using MoviPrep  The instrument was then slowly withdrawn as the colon was fully examined.      COLON FINDINGS: Severe diverticulosis was noted The finding was in the left colon.   The colon was otherwise normal.  There was no inflammation, polyps or cancers.  Retroflexed views revealed internal hemorrhoids. The time to cecum=3 minutes 34 seconds. Withdrawal time=12 minutes 18 seconds.  The scope was withdrawn and the procedure completed. COMPLICATIONS: There were no complications.  ENDOSCOPIC IMPRESSION: 1.   Severe diverticulosis was noted in the left colon 2.   The colon was otherwise normal  RECOMMENDATIONS: 1.  Follow up colonoscopy in 5 years (hx multiple adenomas) 2.  Resume Plavix today   eSigned:  Roxy Cedar, MD 02/08/2012 9:12 AM   cc: Corwin Levins, MD and The Patient

## 2012-02-08 NOTE — Progress Notes (Signed)
0913 in PACU report given to pacu RN, vss, bbs=clear.

## 2012-02-11 ENCOUNTER — Telehealth: Payer: Self-pay

## 2012-02-11 NOTE — Telephone Encounter (Signed)
  Follow up Call-  Call back number 02/08/2012 05/08/2010  Post procedure Call Back phone  # [phone] hm  Permission to leave phone message Yes -     Patient questions:  Do you have a fever, pain , or abdominal swelling? no Pain Score  0 *  Have you tolerated food without any problems? yes  Have you been able to return to your normal activities? yes  Do you have any questions about your discharge instructions: Diet   no Medications  no Follow up visit  no  Do you have questions or concerns about your Care? no  Actions: * If pain score is 4 or above: No action needed, pain <4.

## 2012-02-23 ENCOUNTER — Other Ambulatory Visit: Payer: Self-pay

## 2012-03-06 ENCOUNTER — Telehealth: Payer: Self-pay | Admitting: Internal Medicine

## 2012-03-06 NOTE — Telephone Encounter (Signed)
Ok to see with me or with regina today (regina might be better)

## 2012-03-06 NOTE — Telephone Encounter (Signed)
Patient Information:  Caller Name: Yousof  Phone: [phone]  Patient: Brandon Rojas, Brandon Rojas  Gender: Male  DOB: 05-Aug-1945  Age: 67 Years  PCP: Oliver Barre (Adults only)  Office Follow Up:  Does the office need to follow up with this patient?: Yes  Instructions For The Office: He needs to be seen today and wants to see his MD  I didn't see any appts I could use.  Could he possibly be worked in    He could be there in an hour  alternated number [phone]  RN Note:  states has started with wheeaing and some sinus pressure.  No cough but states chest feels heavy with congestion.  No fever.  He said his wheezing is pretty often.  Is speaking full sentences to me without any shortness of breath heard.  Symptoms  Reason For Call & Symptoms: wheezing  Reviewed Health History In EMR: Yes  Reviewed Medications In EMR: Yes  Reviewed Allergies In EMR: Yes  Reviewed Surgeries / Procedures: Yes  Date of Onset of Symptoms: 03/04/2012  Guideline(s) Used:  Colds  Disposition Per Guideline:   See Today or Tomorrow in Office  Reason For Disposition Reached:   Patient wants to be seen  Advice Given:  Call Back If:  You become worse  RN Overrode Recommendation:  Document Patient  he wants to see his doctor but didn't see available appts  will see if they can work him in

## 2012-03-06 NOTE — Telephone Encounter (Signed)
Appt at 8:30 Fri. Morning.

## 2012-03-07 ENCOUNTER — Encounter: Payer: Self-pay | Admitting: Internal Medicine

## 2012-03-07 ENCOUNTER — Other Ambulatory Visit: Payer: Self-pay | Admitting: Internal Medicine

## 2012-03-07 ENCOUNTER — Ambulatory Visit (INDEPENDENT_AMBULATORY_CARE_PROVIDER_SITE_OTHER): Payer: Medicare Other | Admitting: Internal Medicine

## 2012-03-07 VITALS — BP 142/74 | HR 58 | Temp 97.2°F | Ht 65.0 in | Wt 161.5 lb

## 2012-03-07 DIAGNOSIS — R062 Wheezing: Secondary | ICD-10-CM

## 2012-03-07 DIAGNOSIS — E785 Hyperlipidemia, unspecified: Secondary | ICD-10-CM

## 2012-03-07 DIAGNOSIS — IMO0001 Reserved for inherently not codable concepts without codable children: Secondary | ICD-10-CM

## 2012-03-07 MED ORDER — PREDNISONE 10 MG PO TABS: 10.0000 mg | ORAL_TABLET | Freq: Every day | ORAL | Status: DC

## 2012-03-07 NOTE — Patient Instructions (Signed)
Bronchospasm A bronchospasm is when the tubes that carry air in and out of your lungs (bronchioles) become smaller. It is hard to breathe when this happens. A bronchospasm can be caused by:  Asthma.  Allergies.  Lung infection. HOME CARE   Do not  smoke. Avoid places that have secondhand smoke.  Dust your house often. Have your air ducts cleaned once or twice a year.  Find out what allergies may cause your bronchospasms.  Use your inhaler properly if you have one. Know when to use it.  Eat healthy foods and drink plenty of water.  Only take medicine as told by your doctor. GET HELP RIGHT AWAY IF:  You feel you cannot breathe or catch your breath.  You cannot stop coughing.  Your treatment is not helping you breathe better. MAKE SURE YOU:   Understand these instructions.  Will watch your condition.  Will get help right away if you are not doing well or get worse. Document Released: 10/22/2008 Document Revised: 03/19/2011 Document Reviewed: 10/22/2008 ExitCare Patient Information 2013 [employer].  

## 2012-03-07 NOTE — Progress Notes (Signed)
HPI  Pt presents to the clinic today with c/o sinus congestion and post nasal drip x 2 days. He has also noticed some wheezing and is concerned because he is prone to bronchitis. He does not have asthma and he does not smoke. He denies fever, chills or body aches. He has not taken anything OTC for his symptoms. He is not coughing up any sputum. Additionally, he does c/o intermittent myalgias in his right calf. He has had problems with this in the past when he was on Zocor and Lipitor. He has not had any problems so far with the lovastatin. The pain is intermittent, not that bad and does go away. He is wondering if he could cut back on his medication for 1 month to see if the myalgias go away.  Review of Systems      Past Medical History  Diagnosis Date  . Coronary atherosclerosis of unspecified type of vessel, native or graft   . Personal history of unspecified circulatory disease   . Occlusion and stenosis of carotid artery without mention of cerebral infarction   . Unspecified essential hypertension   . Other specified cardiac dysrhythmias   . Other and unspecified hyperlipidemia   . Obesity, unspecified   . Anxiety state, unspecified   . Depressive disorder, not elsewhere classified   . Esophageal reflux   . Wheezing   . Personal history of colonic polyps   . Routine general medical examination at a health care facility   . Acute sinusitis, unspecified   . Diaphragmatic hernia without mention of obstruction or gangrene   . Diverticulosis of colon (without mention of hemorrhage)   . Benign neoplasm of colon   . Personal history of diseases of skin and subcutaneous tissue   . Osteoarthrosis, unspecified whether generalized or localized, unspecified site   . Allergic rhinitis, cause unspecified   . Psychosexual dysfunction with inhibited sexual excitement   . Hiatal hernia   . Arthritis   . Esophageal stricture     Family History  Problem Relation Age of Onset  . Colon cancer  Sister     History   Social History  . Marital Status: Married    Spouse Name: N/A    Number of Children: 3  . Years of Education: N/A   Occupational History  . PASTOR    Social History Main Topics  . Smoking status: Former Games developer  . Smokeless tobacco: Never Used  . Alcohol Use: No  . Drug Use: No  . Sexually Active: Not on file   Other Topics Concern  . Not on file   Social History Narrative  . No narrative on file    Allergies  Allergen Reactions  . Atorvastatin Other (See Comments)    Muscle cramps  . Simvastatin Other (See Comments)    Muscle cramps     Constitutional:  Denies headache, fatigue, fever or abrupt weight changes.  HEENT:  Positive PND. Denies eye redness, eye pain, pressure behind the eyes, facial pain, nasal congestion, ear pain, ringing in the ears, wax buildup, runny nose or bloody nose. Respiratory: Positive cough. Denies difficulty breathing or shortness of breath.  Cardiovascular: Denies chest pain, chest tightness, palpitations or swelling in the hands or feet.   No other specific complaints in a complete review of systems (except as listed in HPI above).  Objective:   BP 142/74  Pulse 58  Temp(Src) 97.2 F (36.2 C) (Oral)  Ht 5\' 5"  (1.651 m)  Wt 161 lb 8  oz (73.256 kg)  BMI 26.88 kg/m2  SpO2 97% Wt Readings from Last 3 Encounters:  03/07/12 161 lb 8 oz (73.256 kg)  02/08/12 158 lb (71.668 kg)  01/16/12 161 lb (73.029 kg)     General: Appears his stated age, well developed, well nourished in NAD. HEENT: Head: normal shape and size; Eyes: sclera white, no icterus, conjunctiva pink, PERRLA and EOMs intact; Ears: Tm's gray and intact, normal light reflex; Nose: mucosa pink and moist, septum midline; Throat/Mouth: + PND. Teeth present, mucosa erythematous and moist, no exudate noted, no lesions or ulcerations noted.  Neck: Mild cervical lymphadenopathy. Neck supple, trachea midline. No massses, lumps or thyromegaly present.   Cardiovascular: Normal rate and rhythm. S1,S2 noted.  No murmur, rubs or gallops noted. No JVD or BLE edema. No carotid bruits noted. Pulmonary/Chest: Normal effort and intermittent bilateral expiratory wheezing. No respiratory distress. No rales or ronchi noted.      Assessment & Plan:   Wheezing, new, no additional workup required:  eRx for Prednisone 10 mg x 7 days No indication for treatment with antibiotics at this time  Hyperlipidemia with intermittent myalgias:  Reviewed last lipid profile from 12/2011 Ok to cut lovastatin to 20 mg x 4 weeks to see if intermittent myalgias improve If no improvement, will check CK and LFT's  RTC in 1 month to reassess myalgias

## 2012-06-06 ENCOUNTER — Encounter: Payer: Self-pay | Admitting: Internal Medicine

## 2012-06-09 ENCOUNTER — Other Ambulatory Visit (INDEPENDENT_AMBULATORY_CARE_PROVIDER_SITE_OTHER): Payer: Medicare Other

## 2012-06-09 DIAGNOSIS — E785 Hyperlipidemia, unspecified: Secondary | ICD-10-CM

## 2012-06-09 LAB — HEPATIC FUNCTION PANEL
ALT: 19 U/L (ref 0–53)
Albumin: 3.4 g/dL — ABNORMAL LOW (ref 3.5–5.2)
Alkaline Phosphatase: 55 U/L (ref 39–117)
Bilirubin, Direct: 0 mg/dL (ref 0.0–0.3)
Total Protein: 6.4 g/dL (ref 6.0–8.3)

## 2012-06-09 LAB — LIPID PANEL: Total CHOL/HDL Ratio: 3

## 2012-06-10 ENCOUNTER — Ambulatory Visit (INDEPENDENT_AMBULATORY_CARE_PROVIDER_SITE_OTHER): Payer: Medicare Other | Admitting: Internal Medicine

## 2012-06-10 ENCOUNTER — Encounter: Payer: Self-pay | Admitting: Internal Medicine

## 2012-06-10 VITALS — BP 142/84 | HR 55 | Temp 98.2°F | Ht 65.0 in | Wt 164.2 lb

## 2012-06-10 DIAGNOSIS — E785 Hyperlipidemia, unspecified: Secondary | ICD-10-CM

## 2012-06-10 DIAGNOSIS — I2510 Atherosclerotic heart disease of native coronary artery without angina pectoris: Secondary | ICD-10-CM

## 2012-06-10 DIAGNOSIS — I10 Essential (primary) hypertension: Secondary | ICD-10-CM

## 2012-06-10 DIAGNOSIS — Z0000 Encounter for general adult medical examination without abnormal findings: Secondary | ICD-10-CM

## 2012-06-10 NOTE — Patient Instructions (Signed)
Please continue all other medications as before, and refills have been done if requested. You will be contacted regarding the referral for: cardiology  Thank you for enrolling in MyChart. Please follow the instructions below to securely access your online medical record. MyChart allows you to send messages to your doctor, view your test results, renew your prescriptions, schedule appointments, and more.  Please return in 6 months, or sooner if needed, with Lab testing done 3-5 days before

## 2012-06-10 NOTE — Progress Notes (Signed)
Subjective:    Patient ID: Brandon Rojas, male    DOB: 29-Jul-1945, 67 y.o.   MRN: [id number]  HPI  Here to f/u; overall doing ok,  Pt denies chest pain, increased sob or doe, wheezing, orthopnea, PND, increased LE swelling, palpitations, dizziness or syncope.  Pt denies polydipsia, polyuria, or low sugar symptoms such as weakness or confusion improved with po intake.  Pt denies new neurological symptoms such as new headache, or facial or extremity weakness or numbness.   Pt states overall good compliance with meds, has been trying to follow lower cholesterol diet, with wt overall stable,  but little exercise however, plans to re-start going to the gym in [place] due to being busy, still working full time.  Pt denies fever, wt loss, night sweats, loss of appetite, or other constitutional symptoms Past Medical History  Diagnosis Date  . Coronary atherosclerosis of unspecified type of vessel, native or graft   . Personal history of unspecified circulatory disease   . Occlusion and stenosis of carotid artery without mention of cerebral infarction   . Unspecified essential hypertension   . Other specified cardiac dysrhythmias(427.89)   . Other and unspecified hyperlipidemia   . Obesity, unspecified   . Anxiety state, unspecified   . Depressive disorder, not elsewhere classified   . Esophageal reflux   . Wheezing   . Personal history of colonic polyps   . Routine general medical examination at a health care facility   . Acute sinusitis, unspecified   . Diaphragmatic hernia without mention of obstruction or gangrene   . Diverticulosis of colon (without mention of hemorrhage)   . Benign neoplasm of colon   . Personal history of diseases of skin and subcutaneous tissue   . Osteoarthrosis, unspecified whether generalized or localized, unspecified site   . Allergic rhinitis, cause unspecified   . Psychosexual dysfunction with inhibited sexual excitement   . Hiatal hernia   . Arthritis   .  Esophageal stricture    Past Surgical History  Procedure Laterality Date  . Lung biopsy    . Inguinal hernia repair    . Ptca    . Coronary artery bypass graft      x 2  . Colonoscopy    . Polypectomy      reports that he has quit smoking. He has never used smokeless tobacco. He reports that he does not drink alcohol or use illicit drugs. family history includes Colon cancer in his sister. Allergies  Allergen Reactions  . Atorvastatin Other (See Comments)    Muscle cramps  . Simvastatin Other (See Comments)    Muscle cramps   Current Outpatient Prescriptions on File Prior to Visit  Medication Sig Dispense Refill  . aspirin 81 MG tablet Take 81 mg by mouth daily.        . B Complex Vitamins (VITAMIN B COMPLEX PO) Take 1 tablet by mouth daily.        . clopidogrel (PLAVIX) 75 MG tablet take 1 tablet by mouth once daily  90 tablet  3  . esomeprazole (NEXIUM) 40 MG capsule Take 1 capsule (40 mg total) by mouth daily.  90 capsule  3  . lisinopril (PRINIVIL,ZESTRIL) 10 MG tablet Take 1 tablet (10 mg total) by mouth daily.  90 tablet  3  . lisinopril (PRINIVIL,ZESTRIL) 5 MG tablet take 1 tablet by mouth once daily  90 tablet  3  . lovastatin (MEVACOR) 40 MG tablet take 1 tablet by mouth once daily  90 tablet  3  . nitroGLYCERIN (NITROSTAT) 0.4 MG SL tablet Place 1 tablet (0.4 mg total) under the tongue every 5 (five) minutes as needed.  30 tablet  5  . Omega-3 Fatty Acids (FISH OIL) 1000 MG CAPS Take 1-2 capsules by mouth daily.        . traMADol (ULTRAM) 50 MG tablet Take 1 tablet (50 mg total) by mouth every 6 (six) hours as needed for pain.  120 tablet  3  . vitamin E 400 UNIT capsule Take 400 Units by mouth daily.         No current facility-administered medications on file prior to visit.   Review of Systems  Constitutional: Negative for unexpected weight change, or unusual diaphoresis  HENT: Negative for tinnitus.   Eyes: Negative for photophobia and visual disturbance.   Respiratory: Negative for choking and stridor.   Gastrointestinal: Negative for vomiting and blood in stool.  Genitourinary: Negative for hematuria and decreased urine volume.  Musculoskeletal: Negative for acute joint swelling Skin: Negative for color change and wound.  Neurological: Negative for tremors and numbness other than noted  Psychiatric/Behavioral: Negative for decreased concentration or  hyperactivity.       Objective:   Physical Exam BP 142/84  Pulse 55  Temp(Src) 98.2 F (36.8 C) (Oral)  Ht 5\' 5"  (1.651 m)  Wt 164 lb 4 oz (74.503 kg)  BMI 27.33 kg/m2  SpO2 95% VS noted,  Constitutional: Pt appears well-developed and well-nourished.  HENT: Head: NCAT.  Right Ear: External ear normal.  Left Ear: External ear normal.  Eyes: Conjunctivae and EOM are normal. Pupils are equal, round, and reactive to light.  Neck: Normal range of motion. Neck supple.  Cardiovascular: Normal rate and regular rhythm.   Pulmonary/Chest: Effort normal and breath sounds normal.  Abd:  Soft, NT, non-distended, + BS Neurological: Pt is alert. Not confused  Skin: Skin is warm. No erythema.  Psychiatric: Pt behavior is normal. Thought content normal.     Assessment & Plan:

## 2012-06-10 NOTE — Assessment & Plan Note (Signed)
Due for f/u now, but pt has not received letter in mail and was not aware he had been asked to do this per Michele Lenze last visit; will refer back to card, will need new cardiology as Dr Stuckey has retired 

## 2012-06-10 NOTE — Assessment & Plan Note (Signed)
stable overall by history and exam, recent data reviewed with pt, and pt to continue medical treatment as before,  to f/u any worsening symptoms or concerns BP Readings from Last 3 Encounters:  06/10/12 142/84  03/07/12 142/74  02/08/12 148/72

## 2012-06-10 NOTE — Assessment & Plan Note (Signed)
stable overall by history and exam, recent data reviewed with pt, and pt to continue medical treatment as before,  to f/u any worsening symptoms or concerns Lab Results  Component Value Date   LDLCALC 69 06/09/2012

## 2012-06-10 NOTE — Assessment & Plan Note (Deleted)
Due for f/u now, but pt has not received letter in mail and was not aware he had been asked to do this per Jacolyn Reedy last visit; will refer back to card, will need new cardiology as Dr Riley Kill has retired

## 2012-06-11 ENCOUNTER — Encounter: Payer: Self-pay | Admitting: Cardiology

## 2012-06-11 ENCOUNTER — Ambulatory Visit (INDEPENDENT_AMBULATORY_CARE_PROVIDER_SITE_OTHER): Payer: Medicare Other | Admitting: Cardiology

## 2012-06-11 VITALS — BP 135/80 | HR 53 | Ht 65.0 in | Wt 162.1 lb

## 2012-06-11 DIAGNOSIS — I25810 Atherosclerosis of coronary artery bypass graft(s) without angina pectoris: Secondary | ICD-10-CM

## 2012-06-11 NOTE — Patient Instructions (Addendum)
The current medical regimen is effective;  continue present plan and medications.  Follow up in 1 year with Dr Hochrein.  You will receive a letter in the mail 2 months before you are due.  Please call us when you receive this letter to schedule your follow up appointment.  

## 2012-06-11 NOTE — Progress Notes (Signed)
HPI  The patient was previously followed by Dr. Bonnee Quin with a history of coronary artery disease status post CABG in 2006. Last cardiac catheterization was 2012 in which he had a patent LIMA to the LAD, patent SVG to the PLA, and a small-caliber disease involving the first diagonal and first marginal that were not optimal for percutaneous intervention. He had good LV function. He had a low risk nuclear scan late last year and was managed medically following this.  Since he was last seen she's had no new symptoms. The patient denies any new symptoms such as chest discomfort, neck or arm discomfort. There has been no new shortness of breath, PND or orthopnea. There have been no reported palpitations, presyncope or syncope.  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   Allergies  Allergen Reactions  . Atorvastatin Other (See Comments)    Muscle cramps  . Simvastatin Other (See Comments)    Muscle cramps    Current Outpatient Prescriptions  Medication Sig Dispense Refill  . aspirin 81 MG tablet Take 81 mg by mouth daily.        . B Complex Vitamins (VITAMIN B COMPLEX PO) Take 1 tablet by mouth daily.        . clopidogrel (PLAVIX) 75 MG tablet take 1 tablet by mouth once daily  90 tablet  3  . esomeprazole (NEXIUM) 40 MG capsule Take 1 capsule (40 mg total) by mouth daily.  90 capsule  3  . lisinopril (PRINIVIL,ZESTRIL) 10 MG tablet Take 1 tablet (10 mg total) by mouth daily.  90 tablet  3  . lisinopril (PRINIVIL,ZESTRIL) 5 MG tablet take 1 tablet by mouth once daily  90 tablet  3  . lovastatin (MEVACOR) 40 MG tablet take 1 tablet by mouth once daily  90 tablet  3  . nitroGLYCERIN (NITROSTAT) 0.4 MG SL tablet Place 1 tablet (0.4 mg total) under the tongue every 5 (five) minutes as needed.  30 tablet  5  . Omega-3 Fatty Acids (FISH OIL) 1000 MG CAPS Take 1-2 capsules by mouth daily.        . traMADol (ULTRAM) 50 MG tablet Take 1 tablet (50 mg total) by mouth every 6 (six) hours as  needed for pain.  120 tablet  3  . vitamin E 400 UNIT capsule Take 400 Units by mouth daily.         No current facility-administered medications for this visit.    Past Medical History  Diagnosis Date  . Coronary atherosclerosis of unspecified type of vessel, native or graft   . Personal history of unspecified circulatory disease   . Occlusion and stenosis of carotid artery without mention of cerebral infarction   . Unspecified essential hypertension   . Other specified cardiac dysrhythmias(427.89)   . Other and unspecified hyperlipidemia   . Obesity, unspecified   . Anxiety state, unspecified   . Depressive disorder, not elsewhere classified   . Esophageal reflux   . Wheezing   . Personal history of colonic polyps   . Routine general medical examination at a health care facility   . Acute sinusitis, unspecified   . Diaphragmatic hernia without mention of obstruction or gangrene   . Diverticulosis of colon (without mention of hemorrhage)   . Benign neoplasm of colon   . Personal history of diseases of skin and subcutaneous tissue   . Osteoarthrosis, unspecified whether generalized or localized, unspecified site   . Allergic rhinitis, cause unspecified   .  Psychosexual dysfunction with inhibited sexual excitement   . Hiatal hernia   . Arthritis   . Esophageal stricture     Past Surgical History  Procedure Laterality Date  . Lung biopsy    . Inguinal hernia repair    . Ptca    . Coronary artery bypass graft      x 2  . Colonoscopy    . Polypectomy      ROS:  As stated in the HPI and negative for all other systems.  PHYSICAL EXAM BP 135/80  Pulse 53  Ht 5\' 5"  (1.651 m)  Wt 162 lb 1.9 oz (73.537 kg)  BMI 26.98 kg/m2 GENERAL:  Well appearing HEENT:  Pupils equal round and reactive, fundi not visualized, oral mucosa unremarkable NECK:  No jugular venous distention, waveform within normal limits, carotid upstroke brisk and symmetric, no bruits, no  thyromegaly LYMPHATICS:  No cervical, inguinal adenopathy LUNGS:  Clear to auscultation bilaterally BACK:  No CVA tenderness CHEST:  Well healed sternotomy scar. HEART:  PMI not displaced or sustained,S1 and S2 within normal limits, no S3, no S4, no clicks, no rubs, no murmurs ABD:  Flat, positive bowel sounds normal in frequency in pitch, no bruits, no rebound, no guarding, no midline pulsatile mass, no hepatomegaly, no splenomegaly EXT:  2 plus pulses throughout, no edema, no cyanosis no clubbing SKIN:  No rashes no nodules NEURO:  Cranial nerves II through XII grossly intact, motor grossly intact throughout PSYCH:  Cognitively intact, oriented to person place and time  EKG:  Sinus bradycardia, rate 53, axis within normal limits, intervals within normal limits, no acute ST-T wave changes.  ASSESSMENT AND PLAN  CAD:  The patient has no new sypmtoms.  No further cardiovascular testing is indicated.  We will continue with aggressive risk reduction and meds as listed.  HYPERLIPIDEMIA:  LDL yesterday was 82 with an HDL of 44. He will continue on the meds as listed.  HTN:  The blood pressure is at target. No change in medications is indicated. We will continue with therapeutic lifestyle changes (TLC).

## 2012-06-13 ENCOUNTER — Other Ambulatory Visit: Payer: Self-pay | Admitting: Internal Medicine

## 2012-06-30 ENCOUNTER — Other Ambulatory Visit: Payer: Self-pay | Admitting: Internal Medicine

## 2012-10-09 ENCOUNTER — Other Ambulatory Visit: Payer: Self-pay | Admitting: Internal Medicine

## 2012-10-10 NOTE — Telephone Encounter (Signed)
Done hardcopy to robin  

## 2012-10-10 NOTE — Telephone Encounter (Signed)
Faxed hardcopy to [facility] [address] [place] 

## 2012-11-13 ENCOUNTER — Other Ambulatory Visit: Payer: Self-pay

## 2012-12-03 ENCOUNTER — Other Ambulatory Visit: Payer: Self-pay

## 2012-12-03 MED ORDER — LISINOPRIL 10 MG PO TABS: 10.0000 mg | ORAL_TABLET | Freq: Every day | ORAL | Status: DC

## 2012-12-08 ENCOUNTER — Other Ambulatory Visit (INDEPENDENT_AMBULATORY_CARE_PROVIDER_SITE_OTHER): Payer: Medicare Other

## 2012-12-08 DIAGNOSIS — E785 Hyperlipidemia, unspecified: Secondary | ICD-10-CM

## 2012-12-08 DIAGNOSIS — Z0000 Encounter for general adult medical examination without abnormal findings: Secondary | ICD-10-CM

## 2012-12-08 LAB — TSH: TSH: 2.97 u[IU]/mL (ref 0.35–5.50)

## 2012-12-08 LAB — URINALYSIS, ROUTINE W REFLEX MICROSCOPIC
Bilirubin Urine: NEGATIVE
Hgb urine dipstick: NEGATIVE
Ketones, ur: NEGATIVE
Leukocytes, UA: NEGATIVE
Nitrite: NEGATIVE
Total Protein, Urine: NEGATIVE
Urobilinogen, UA: 0.2 (ref 0.0–1.0)
pH: 6 (ref 5.0–8.0)

## 2012-12-08 LAB — BASIC METABOLIC PANEL
BUN: 14 mg/dL (ref 6–23)
Calcium: 10 mg/dL (ref 8.4–10.5)
Chloride: 108 mEq/L (ref 96–112)
Creatinine, Ser: 1.2 mg/dL (ref 0.4–1.5)
Glucose, Bld: 88 mg/dL (ref 70–99)
Potassium: 4.8 mEq/L (ref 3.5–5.1)

## 2012-12-08 LAB — CBC WITH DIFFERENTIAL/PLATELET
Basophils Absolute: 0 10*3/uL (ref 0.0–0.1)
Basophils Relative: 0.8 % (ref 0.0–3.0)
Eosinophils Relative: 8.6 % — ABNORMAL HIGH (ref 0.0–5.0)
Lymphocytes Relative: 22.3 % (ref 12.0–46.0)
MCV: 89.7 fl (ref 78.0–100.0)
Monocytes Absolute: 0.5 10*3/uL (ref 0.1–1.0)
Neutrophils Relative %: 59.5 % (ref 43.0–77.0)
Platelets: 268 10*3/uL (ref 150.0–400.0)
WBC: 6.1 10*3/uL (ref 4.5–10.5)

## 2012-12-08 LAB — PSA: PSA: 1.74 ng/mL (ref 0.10–4.00)

## 2012-12-08 LAB — HEPATIC FUNCTION PANEL
ALT: 20 U/L (ref 0–53)
AST: 17 U/L (ref 0–37)
Albumin: 3.3 g/dL — ABNORMAL LOW (ref 3.5–5.2)
Total Bilirubin: 0.5 mg/dL (ref 0.3–1.2)

## 2012-12-08 LAB — LIPID PANEL
Cholesterol: 132 mg/dL (ref 0–200)
HDL: 45.5 mg/dL (ref >39.00)

## 2012-12-09 ENCOUNTER — Encounter: Payer: Self-pay | Admitting: Internal Medicine

## 2012-12-09 ENCOUNTER — Ambulatory Visit (INDEPENDENT_AMBULATORY_CARE_PROVIDER_SITE_OTHER): Payer: Medicare Other | Admitting: Internal Medicine

## 2012-12-09 VITALS — BP 140/80 | HR 63 | Temp 97.8°F | Wt 162.4 lb

## 2012-12-09 DIAGNOSIS — Z0000 Encounter for general adult medical examination without abnormal findings: Secondary | ICD-10-CM

## 2012-12-09 DIAGNOSIS — Z23 Encounter for immunization: Secondary | ICD-10-CM

## 2012-12-09 MED ORDER — ESOMEPRAZOLE MAGNESIUM 40 MG PO CPDR: 40.0000 mg | DELAYED_RELEASE_CAPSULE | Freq: Every day | ORAL | Status: DC

## 2012-12-09 NOTE — Patient Instructions (Addendum)
You had the flu shot today Please return in 2 wks for a Nurse Visit for the new Prevnar pneumonia shot, and please call your ins company to see if the shingles shot is covered Please continue all other medications as before, and refills have been done if requested. Please have the pharmacy call with any other refills you may need. Please continue your efforts at being more active, low cholesterol diet, and weight control. You are otherwise up to date with prevention measures today.  Please keep your appointments with your specialists as you have planned - cardiology  Please return in 1 year for your yearly visit, or sooner if needed, with Lab testing done 3-5 days before

## 2012-12-09 NOTE — Progress Notes (Signed)
Pre-visit discussion using our clinic review tool. No additional management support is needed unless otherwise documented below in the visit note.  

## 2012-12-09 NOTE — Assessment & Plan Note (Signed)

## 2012-12-09 NOTE — Progress Notes (Signed)
Subjective:    Patient ID: Brandon Rojas, male    DOB: November 13, 1945, 67 y.o.   MRN: [id number]  HPI  Here for wellness and f/u;  Overall doing ok;  Pt denies CP, worsening SOB, DOE, wheezing, orthopnea, PND, worsening LE edema, palpitations, dizziness or syncope.  Pt denies neurological change such as new headache, facial or extremity weakness.  Pt denies polydipsia, polyuria, or low sugar symptoms. Pt states overall good compliance with treatment and medications, good tolerability, and has been trying to follow lower cholesterol diet.  Pt denies worsening depressive symptoms, suicidal ideation or panic. No fever, night sweats, wt loss, loss of appetite, or other constitutional symptoms.  Pt states good ability with ADL's, has low fall risk, home safety reviewed and adequate, no other significant changes in hearing or vision, and only occasionally active with exercise.  Wants to try the flu shot, though he has deferred in yrs past.  No new complaints.  Due for prevnar pna shot.  Pt continues to have recurring mid and left LBP without change in severity, bowel or bladder change, fever, wt loss,  worsening LE pain/numbness/weakness, gait change or falls. Past Medical History  Diagnosis Date  . Coronary atherosclerosis of unspecified type of vessel, native or graft   . Personal history of unspecified circulatory disease   . Occlusion and stenosis of carotid artery without mention of cerebral infarction   . Unspecified essential hypertension   . Other specified cardiac dysrhythmias(427.89)   . Other and unspecified hyperlipidemia   . Obesity, unspecified   . Anxiety state, unspecified   . Depressive disorder, not elsewhere classified   . Esophageal reflux   . Wheezing   . Personal history of colonic polyps   . Acute sinusitis, unspecified   . Diaphragmatic hernia without mention of obstruction or gangrene   . Diverticulosis of colon (without mention of hemorrhage)   . Benign neoplasm of colon   .  Personal history of diseases of skin and subcutaneous tissue   . Osteoarthrosis, unspecified whether generalized or localized, unspecified site   . Allergic rhinitis, cause unspecified   . Psychosexual dysfunction with inhibited sexual excitement   . Hiatal hernia   . Arthritis   . Esophageal stricture    Past Surgical History  Procedure Laterality Date  . Lung biopsy    . Inguinal hernia repair      bilateral  . Ptca    . Coronary artery bypass graft      x 2  . Colonoscopy    . Polypectomy      reports that he has quit smoking. He has never used smokeless tobacco. He reports that he does not drink alcohol or use illicit drugs. family history includes Colon cancer in his sister. Allergies  Allergen Reactions  . Atorvastatin Other (See Comments)    Muscle cramps  . Simvastatin Other (See Comments)    Muscle cramps   Current Outpatient Prescriptions on File Prior to Visit  Medication Sig Dispense Refill  . aspirin 81 MG tablet Take 81 mg by mouth daily.        . B Complex Vitamins (VITAMIN B COMPLEX PO) Take 1 tablet by mouth daily.        . clopidogrel (PLAVIX) 75 MG tablet take 1 tablet by mouth once daily  90 tablet  3  . esomeprazole (NEXIUM) 40 MG capsule Take 1 capsule (40 mg total) by mouth daily.  90 capsule  3  . lisinopril (PRINIVIL,ZESTRIL) 10 MG  tablet Take 1 tablet (10 mg total) by mouth daily.  90 tablet  3  . lovastatin (MEVACOR) 40 MG tablet take 1 tablet by mouth once daily  90 tablet  3  . NEXIUM 40 MG capsule take 1 capsule by mouth once daily  90 capsule  3  . nitroGLYCERIN (NITROSTAT) 0.4 MG SL tablet Place 1 tablet (0.4 mg total) under the tongue every 5 (five) minutes as needed.  30 tablet  5  . Omega-3 Fatty Acids (FISH OIL) 1000 MG CAPS Take 1-2 capsules by mouth daily.        . traMADol (ULTRAM) 50 MG tablet take 1 tablet by mouth every 6 hours if needed for pain  120 tablet  3  . vitamin E 400 UNIT capsule Take 400 Units by mouth daily.         No  current facility-administered medications on file prior to visit.   Review of Systems Constitutional: Negative for diaphoresis, activity change, appetite change or unexpected weight change.  HENT: Negative for hearing loss, ear pain, facial swelling, mouth sores and neck stiffness.   Eyes: Negative for pain, redness and visual disturbance.  Respiratory: Negative for shortness of breath and wheezing.   Cardiovascular: Negative for chest pain and palpitations.  Gastrointestinal: Negative for diarrhea, blood in stool, abdominal distention or other pain Genitourinary: Negative for hematuria, flank pain or change in urine volume.  Musculoskeletal: Negative for myalgias and joint swelling.  Skin: Negative for color change and wound.  Neurological: Negative for syncope and numbness. other than noted Hematological: Negative for adenopathy.  Psychiatric/Behavioral: Negative for hallucinations, self-injury, decreased concentration and agitation.      Objective:   Physical Exam BP 140/80  Pulse 63  Temp(Src) 97.8 F (36.6 C) (Oral)  Wt 162 lb 6 oz (73.653 kg)  SpO2 97% VS noted,  Constitutional: Pt is oriented to person, place, and time. Appears well-developed and well-nourished.  Head: Normocephalic and atraumatic.  Right Ear: External ear normal.  Left Ear: External ear normal.  Nose: Nose normal.  Mouth/Throat: Oropharynx is clear and moist.  Eyes: Conjunctivae and EOM are normal. Pupils are equal, round, and reactive to light.  Neck: Normal range of motion. Neck supple. No JVD present. No tracheal deviation present.  Cardiovascular: Normal rate, regular rhythm, normal heart sounds and intact distal pulses.   Pulmonary/Chest: Effort normal and breath sounds normal.  Abdominal: Soft. Bowel sounds are normal. There is no tenderness. No HSM  Musculoskeletal: Normal range of motion. Exhibits no edema.  Lymphadenopathy:  Has no cervical adenopathy.  Neurological: Pt is alert and oriented to  person, place, and time. Pt has normal reflexes. No cranial nerve deficit.  Skin: Skin is warm and dry. No rash noted.  Psychiatric:  Has  normal mood and affect. Behavior is normal.     Assessment & Plan:

## 2012-12-09 NOTE — Addendum Note (Signed)
Addended by: Scharlene Gloss B on: 12/09/2012 11:58 AM   Modules accepted: Orders

## 2012-12-23 ENCOUNTER — Ambulatory Visit (INDEPENDENT_AMBULATORY_CARE_PROVIDER_SITE_OTHER): Payer: Medicare Other

## 2012-12-23 DIAGNOSIS — Z23 Encounter for immunization: Secondary | ICD-10-CM

## 2012-12-23 NOTE — Progress Notes (Signed)
This encounter was used for a Nurse Visit. Patient to receive the Prevnar vaccine.

## 2013-01-27 ENCOUNTER — Other Ambulatory Visit: Payer: Self-pay

## 2013-01-27 MED ORDER — LISINOPRIL 10 MG PO TABS: 10.0000 mg | ORAL_TABLET | Freq: Every day | ORAL | Status: DC

## 2013-01-27 MED ORDER — ESOMEPRAZOLE MAGNESIUM 40 MG PO CPDR: 40.0000 mg | DELAYED_RELEASE_CAPSULE | Freq: Every day | ORAL | Status: DC

## 2013-01-27 MED ORDER — LOVASTATIN 40 MG PO TABS: 40.0000 mg | ORAL_TABLET | Freq: Every day | ORAL | Status: DC

## 2013-01-27 MED ORDER — CLOPIDOGREL BISULFATE 75 MG PO TABS: 75.0000 mg | ORAL_TABLET | Freq: Once | ORAL | Status: DC

## 2013-01-27 MED ORDER — TRAMADOL HCL 50 MG PO TABS: ORAL_TABLET | ORAL | Status: DC

## 2013-01-27 NOTE — Telephone Encounter (Signed)
Done hardcopy to robin  

## 2013-01-27 NOTE — Telephone Encounter (Signed)
Faxed hardcopy to [facility]. 

## 2013-01-29 ENCOUNTER — Other Ambulatory Visit: Payer: Self-pay | Admitting: *Deleted

## 2013-01-29 MED ORDER — CLOPIDOGREL BISULFATE 75 MG PO TABS: 75.0000 mg | ORAL_TABLET | Freq: Every day | ORAL | Status: DC

## 2013-03-17 ENCOUNTER — Other Ambulatory Visit: Payer: Self-pay | Admitting: Internal Medicine

## 2013-06-05 ENCOUNTER — Encounter: Payer: Self-pay | Admitting: Cardiology

## 2013-06-23 ENCOUNTER — Ambulatory Visit: Payer: Medicare Other | Admitting: Cardiology

## 2013-07-21 ENCOUNTER — Ambulatory Visit: Payer: Medicare Other | Admitting: Cardiology

## 2013-08-04 ENCOUNTER — Other Ambulatory Visit: Payer: Self-pay

## 2013-08-04 MED ORDER — TRAMADOL HCL 50 MG PO TABS: ORAL_TABLET | ORAL | Status: DC

## 2013-08-04 NOTE — Telephone Encounter (Signed)
Faxed hardcopy to [facility]. 

## 2013-08-04 NOTE — Telephone Encounter (Signed)
Done hardcopy to robin  

## 2013-08-21 ENCOUNTER — Ambulatory Visit (INDEPENDENT_AMBULATORY_CARE_PROVIDER_SITE_OTHER): Payer: Commercial Managed Care - HMO | Admitting: Cardiology

## 2013-08-21 ENCOUNTER — Encounter: Payer: Self-pay | Admitting: Cardiology

## 2013-08-21 ENCOUNTER — Other Ambulatory Visit: Payer: Self-pay | Admitting: *Deleted

## 2013-08-21 VITALS — BP 130/62 | HR 58 | Ht 65.0 in | Wt 156.0 lb

## 2013-08-21 DIAGNOSIS — I25810 Atherosclerosis of coronary artery bypass graft(s) without angina pectoris: Secondary | ICD-10-CM | POA: Diagnosis not present

## 2013-08-21 MED ORDER — NITROGLYCERIN 0.4 MG SL SUBL: 0.4000 mg | SUBLINGUAL_TABLET | SUBLINGUAL | Status: DC | PRN

## 2013-08-21 NOTE — Patient Instructions (Signed)
Your physician recommends that you schedule a follow-up appointment in: one year with Dr. Hochrein  

## 2013-08-21 NOTE — Progress Notes (Addendum)
HPI  The patient presents for follow up of  coronary artery disease status post CABG in 2006. Last cardiac catheterization was 2012 in which he had a patent LIMA to the LAD, patent SVG to the PLA, and a small-caliber disease involving the first diagonal and first marginal that were not optimal for percutaneous intervention. He had good LV function.  Since he was last seen he has had no new symptoms. 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   The patient denies any new symptoms such as chest discomfort, neck or arm discomfort. There has been no new shortness of breath, PND or orthopnea. There have been no reported palpitations, presyncope or syncope.     Allergies  Allergen Reactions  . Atorvastatin Other (See Comments)    Muscle cramps  . Simvastatin Other (See Comments)    Muscle cramps    Current Outpatient Prescriptions  Medication Sig Dispense Refill  . aspirin 81 MG tablet Take 81 mg by mouth daily.        . B Complex Vitamins (VITAMIN B COMPLEX PO) Take 1 tablet by mouth daily.        . clopidogrel (PLAVIX) 75 MG tablet Take 1 tablet (75 mg total) by mouth daily with breakfast.  90 tablet  3  . clopidogrel (PLAVIX) 75 MG tablet take 1 tablet by mouth once daily  90 tablet  3  . esomeprazole (NEXIUM) 40 MG capsule Take 1 capsule (40 mg total) by mouth daily.  90 capsule  3  . lisinopril (PRINIVIL,ZESTRIL) 10 MG tablet Take 1 tablet (10 mg total) by mouth daily.  90 tablet  3  . lovastatin (MEVACOR) 40 MG tablet Take 1 tablet (40 mg total) by mouth daily.  90 tablet  3  . NEXIUM 40 MG capsule take 1 capsule by mouth once daily  90 capsule  3  . nitroGLYCERIN (NITROSTAT) 0.4 MG SL tablet Place 1 tablet (0.4 mg total) under the tongue every 5 (five) minutes as needed.  30 tablet  5  . Omega-3 Fatty Acids (FISH OIL) 1000 MG CAPS Take 1-2 capsules by mouth daily.        . traMADol (ULTRAM) 50 MG tablet take 1 tablet by mouth every 6 hours if needed for pain  120 tablet  3  .  vitamin E 400 UNIT capsule Take 400 Units by mouth daily.         No current facility-administered medications for this visit.    Past Medical History  Diagnosis Date  . Coronary atherosclerosis of unspecified type of vessel, native or graft   . Personal history of unspecified circulatory disease   . Occlusion and stenosis of carotid artery without mention of cerebral infarction   . Unspecified essential hypertension   . Other specified cardiac dysrhythmias(427.89)   . Other and unspecified hyperlipidemia   . Obesity, unspecified   . Anxiety state, unspecified   . Depressive disorder, not elsewhere classified   . Esophageal reflux   . Wheezing   . Personal history of colonic polyps   . Acute sinusitis, unspecified   . Diaphragmatic hernia without mention of obstruction or gangrene   . Diverticulosis of colon (without mention of hemorrhage)   . Benign neoplasm of colon   . Personal history of diseases of skin and subcutaneous tissue   . Osteoarthrosis, unspecified whether generalized or localized, unspecified site   . Allergic rhinitis, cause unspecified   . Psychosexual dysfunction with inhibited sexual excitement   .  Hiatal hernia   . Arthritis   . Esophageal stricture     Past Surgical History  Procedure Laterality Date  . Lung biopsy    . Inguinal hernia repair      bilateral  . Ptca    . Coronary artery bypass graft      x 2  . Colonoscopy    . Polypectomy      ROS:  As stated in the HPI and negative for all other systems.  PHYSICAL EXAM BP 130/62  Pulse 58  Ht 5\' 5"  (1.651 m)  Wt 156 lb (70.761 kg)  BMI 25.96 kg/m2 GENERAL:  Well appearing HEENT:  Pupils equal round and reactive, fundi not visualized, oral mucosa unremarkable NECK:  No jugular venous distention, waveform within normal limits, carotid upstroke brisk and symmetric, no bruits, no thyromegaly LUNGS:  Clear to auscultation bilaterally CHEST:  Well healed sternotomy scar. HEART:  PMI not  displaced or sustained,S1 and S2 within normal limits, no S3, no S4, no clicks, no rubs, no murmurs ABD:  Flat, positive bowel sounds normal in frequency in pitch, no bruits, no rebound, no guarding, no midline pulsatile mass, no hepatomegaly, no splenomegaly EXT:  2 plus pulses throughout, no edema, no cyanosis no clubbing   EKG:  Sinus bradycardia, rate 58, axis within normal limits, intervals within normal limits, no acute ST-T wave changes. PACs.  08/21/2013   ASSESSMENT AND PLAN  CAD:  The patient has no new sypmtoms.  No further cardiovascular testing is indicated.  We will continue with aggressive risk reduction and meds as listed.  HYPERLIPIDEMIA:  LDL in Dec was 22 with an HDL of 45. He will continue on the meds as listed.  HTN:  The blood pressure is at target. No change in medications is indicated. We will continue with therapeutic lifestyle changes (TLC).  CAROTID STENOSIS:  He had some mild plaque on Doppler in 2011.  I will order follow Dopplers

## 2013-08-27 ENCOUNTER — Other Ambulatory Visit: Payer: Self-pay | Admitting: *Deleted

## 2013-08-27 DIAGNOSIS — R0989 Other specified symptoms and signs involving the circulatory and respiratory systems: Secondary | ICD-10-CM

## 2013-09-01 ENCOUNTER — Ambulatory Visit (HOSPITAL_COMMUNITY)
Admission: RE | Admit: 2013-09-01 | Discharge: 2013-09-01 | Disposition: A | Discharge status: Home / Self Care | Payer: Medicare HMO | Source: Ambulatory Visit | Attending: Cardiology | Admitting: Cardiology

## 2013-09-01 DIAGNOSIS — R0989 Other specified symptoms and signs involving the circulatory and respiratory systems: Secondary | ICD-10-CM | POA: Insufficient documentation

## 2013-09-01 NOTE — Progress Notes (Signed)
Carotid Duplex Completed. °Brianna L Mazza,RVT °

## 2014-01-13 ENCOUNTER — Other Ambulatory Visit (INDEPENDENT_AMBULATORY_CARE_PROVIDER_SITE_OTHER): Payer: Commercial Managed Care - HMO

## 2014-01-13 DIAGNOSIS — Z0000 Encounter for general adult medical examination without abnormal findings: Secondary | ICD-10-CM | POA: Diagnosis not present

## 2014-01-13 LAB — HEPATIC FUNCTION PANEL
ALT: 16 U/L (ref 0–53)
AST: 23 U/L (ref 0–37)
Albumin: 3.6 g/dL (ref 3.5–5.2)
Alkaline Phosphatase: 66 U/L (ref 39–117)
Bilirubin, Direct: 0.1 mg/dL (ref 0.0–0.3)
TOTAL PROTEIN: 6.2 g/dL (ref 6.0–8.3)
Total Bilirubin: 0.7 mg/dL (ref 0.2–1.2)

## 2014-01-13 LAB — URINALYSIS, ROUTINE W REFLEX MICROSCOPIC
BILIRUBIN URINE: NEGATIVE
Hgb urine dipstick: NEGATIVE
KETONES UR: NEGATIVE
Leukocytes, UA: NEGATIVE
NITRITE: NEGATIVE
PH: 7 (ref 5.0–8.0)
RBC / HPF: NONE SEEN (ref 0–?)
SPECIFIC GRAVITY, URINE: 1.015 (ref 1.000–1.030)
TOTAL PROTEIN, URINE-UPE24: NEGATIVE
URINE GLUCOSE: NEGATIVE
Urobilinogen, UA: 0.2 (ref 0.0–1.0)
WBC, UA: NONE SEEN (ref 0–?)

## 2014-01-13 LAB — CBC WITH DIFFERENTIAL/PLATELET
BASOS ABS: 0.1 10*3/uL (ref 0.0–0.1)
Basophils Relative: 0.7 % (ref 0.0–3.0)
Eosinophils Absolute: 0.4 10*3/uL (ref 0.0–0.7)
Eosinophils Relative: 5.6 % — ABNORMAL HIGH (ref 0.0–5.0)
HCT: 41.9 % (ref 39.0–52.0)
Hemoglobin: 13.7 g/dL (ref 13.0–17.0)
Lymphocytes Relative: 17.6 % (ref 12.0–46.0)
Lymphs Abs: 1.4 10*3/uL (ref 0.7–4.0)
MCHC: 32.7 g/dL (ref 30.0–36.0)
MCV: 89.5 fl (ref 78.0–100.0)
MONO ABS: 0.7 10*3/uL (ref 0.1–1.0)
MONOS PCT: 9 % (ref 3.0–12.0)
NEUTROS ABS: 5.2 10*3/uL (ref 1.4–7.7)
Neutrophils Relative %: 67.1 % (ref 43.0–77.0)
Platelets: 292 10*3/uL (ref 150.0–400.0)
RBC: 4.68 Mil/uL (ref 4.22–5.81)
RDW: 14.2 % (ref 11.5–15.5)
WBC: 7.8 10*3/uL (ref 4.0–10.5)

## 2014-01-13 LAB — TSH: TSH: 2.25 u[IU]/mL (ref 0.35–4.50)

## 2014-01-13 LAB — BASIC METABOLIC PANEL
BUN: 19 mg/dL (ref 6–23)
CALCIUM: 11.1 mg/dL — AB (ref 8.4–10.5)
CHLORIDE: 107 meq/L (ref 96–112)
CO2: 30 mEq/L (ref 19–32)
Creatinine, Ser: 1.3 mg/dL (ref 0.4–1.5)
GFR: 58.58 mL/min — ABNORMAL LOW (ref >60.00)
GLUCOSE: 95 mg/dL (ref 70–99)
Potassium: 4.2 mEq/L (ref 3.5–5.1)
SODIUM: 142 meq/L (ref 135–145)

## 2014-01-13 LAB — LIPID PANEL
CHOL/HDL RATIO: 3
CHOLESTEROL: 137 mg/dL (ref 0–200)
HDL: 53.2 mg/dL (ref >39.00)
LDL Cholesterol: 67 mg/dL (ref 0–99)
NonHDL: 83.8
Triglycerides: 82 mg/dL (ref 0.0–149.0)
VLDL: 16.4 mg/dL (ref 0.0–40.0)

## 2014-01-13 LAB — PSA: PSA: 1.57 ng/mL (ref 0.10–4.00)

## 2014-01-15 ENCOUNTER — Other Ambulatory Visit: Payer: Self-pay | Admitting: Internal Medicine

## 2014-01-15 ENCOUNTER — Ambulatory Visit (INDEPENDENT_AMBULATORY_CARE_PROVIDER_SITE_OTHER)
Admission: RE | Admit: 2014-01-15 | Discharge: 2014-01-15 | Disposition: A | Discharge status: Home / Self Care | Payer: Commercial Managed Care - HMO | Source: Ambulatory Visit | Attending: Internal Medicine | Admitting: Internal Medicine

## 2014-01-15 ENCOUNTER — Telehealth: Payer: Self-pay | Admitting: Internal Medicine

## 2014-01-15 ENCOUNTER — Other Ambulatory Visit (INDEPENDENT_AMBULATORY_CARE_PROVIDER_SITE_OTHER): Payer: Commercial Managed Care - HMO

## 2014-01-15 ENCOUNTER — Encounter: Payer: Self-pay | Admitting: Internal Medicine

## 2014-01-15 ENCOUNTER — Ambulatory Visit (INDEPENDENT_AMBULATORY_CARE_PROVIDER_SITE_OTHER): Payer: Commercial Managed Care - HMO | Admitting: Internal Medicine

## 2014-01-15 VITALS — BP 140/82 | HR 57 | Temp 98.1°F | Ht 64.0 in | Wt 164.2 lb

## 2014-01-15 DIAGNOSIS — G259 Extrapyramidal and movement disorder, unspecified: Secondary | ICD-10-CM

## 2014-01-15 DIAGNOSIS — M25552 Pain in left hip: Secondary | ICD-10-CM | POA: Insufficient documentation

## 2014-01-15 DIAGNOSIS — R490 Dysphonia: Secondary | ICD-10-CM

## 2014-01-15 DIAGNOSIS — Z0000 Encounter for general adult medical examination without abnormal findings: Secondary | ICD-10-CM

## 2014-01-15 DIAGNOSIS — I517 Cardiomegaly: Secondary | ICD-10-CM | POA: Diagnosis not present

## 2014-01-15 DIAGNOSIS — M160 Bilateral primary osteoarthritis of hip: Secondary | ICD-10-CM | POA: Diagnosis not present

## 2014-01-15 DIAGNOSIS — Z23 Encounter for immunization: Secondary | ICD-10-CM

## 2014-01-15 DIAGNOSIS — R609 Edema, unspecified: Secondary | ICD-10-CM

## 2014-01-15 LAB — HIGH SENSITIVITY CRP: CRP HIGH SENSITIVITY: 1.28 mg/L (ref 0.000–5.000)

## 2014-01-15 LAB — SEDIMENTATION RATE: Sed Rate: 13 mm/hr (ref 0–22)

## 2014-01-15 LAB — CK: CK TOTAL: 83 U/L (ref 7–232)

## 2014-01-15 NOTE — Progress Notes (Signed)
Pre visit review using our clinic review tool, if applicable. No additional management support is needed unless otherwise documented below in the visit note. 

## 2014-01-15 NOTE — Patient Instructions (Addendum)
You had the flu shot today  Please continue all other medications as before, and refills have been done if requested.  Please have the pharmacy call with any other refills you may need.  Please continue your efforts at being more active, low cholesterol diet, and weight control.  You are otherwise up to date with prevention measures today.  Please keep your appointments with your specialists as you may have planned  You will be contacted regarding the referral for: Echocardiogram, neurology, ENT, and Dr [name] in this office for the left hip  Please go to the LAB in the Basement (turn left off the elevator) for the tests to be done today  You will be contacted by phone if any changes need to be made immediately.  Otherwise, you will receive a letter about your results with an explanation, but please check with MyChart first.  Please remember to sign up for MyChart if you have not done so, as this will be important to you in the future with finding out test results, communicating by private email, and scheduling acute appointments online when needed.  Please return in 3 months, or sooner if needed

## 2014-01-15 NOTE — Telephone Encounter (Signed)
Attempted to call pt (and also wife) at home and on their mobile numbers, no answer;  Re:  Right paratracheal mass - needs Ct CHest

## 2014-01-15 NOTE — Progress Notes (Signed)
Subjective:    Patient ID: Brandon Rojas, male    DOB: [birth year], 69 y.o.   MRN: [id number]  HPI  Here for wellness and f/u;  Overall doing ok;  Pt denies CP, worsening SOB, DOE, wheezing, orthopnea, PND, worsening LE edema, palpitations, dizziness or syncope.  Pt denies neurological change such as new headache, facial or extremity weakness.  Pt denies polydipsia, polyuria, or low sugar symptoms. Pt states overall good compliance with treatment and medications, good tolerability, and has been trying to follow lower cholesterol diet.  Pt denies worsening depressive symptoms, suicidal ideation or panic. No fever, night sweats, wt loss, loss of appetite, or other constitutional symptoms.  Pt states good ability with ADL's, has low fall risk, home safety reviewed and adequate, no other significant changes in hearing or vision, and only occasionally active with exercise, worse especially in the past month with slowed movements though cognition remains intact - Has new worsening bilat ankle edema, goes away at night, then back the next day. Last echo 2004 - EF 55-65%, no other significant abnormal.  Also has significant Left prox leg pain for several wks, tends to lean with walking, but no offbalance or falls. Also with unusual hoarseness, that seems to come and go, but more often recently.   Past Medical History  Diagnosis Date  . Coronary atherosclerosis of unspecified type of vessel, native or graft   . Personal history of unspecified circulatory disease   . Occlusion and stenosis of carotid artery without mention of cerebral infarction   . Unspecified essential hypertension   . Other specified cardiac dysrhythmias(427.89)   . Other and unspecified hyperlipidemia   . Obesity, unspecified   . Anxiety state, unspecified   . Depressive disorder, not elsewhere classified   . Esophageal reflux   . Personal history of colonic polyps   . Acute sinusitis, unspecified   . Diaphragmatic hernia without  mention of obstruction or gangrene   . Diverticulosis of colon (without mention of hemorrhage)   . Benign neoplasm of colon   . Personal history of diseases of skin and subcutaneous tissue   . Osteoarthrosis, unspecified whether generalized or localized, unspecified site   . Allergic rhinitis, cause unspecified   . Psychosexual dysfunction with inhibited sexual excitement   . Hiatal hernia   . Arthritis   . Esophageal stricture    Past Surgical History  Procedure Laterality Date  . Lung biopsy    . Inguinal hernia repair      bilateral  . Ptca    . Coronary artery bypass graft      x 2  . Colonoscopy    . Polypectomy      reports that he has quit smoking. He has never used smokeless tobacco. He reports that he does not drink alcohol or use illicit drugs. family history includes Colon cancer in his sister. Allergies  Allergen Reactions  . Atorvastatin Other (See Comments)    Muscle cramps  . Simvastatin Other (See Comments)    Muscle cramps   Current Outpatient Prescriptions on File Prior to Visit  Medication Sig Dispense Refill  . aspirin 81 MG tablet Take 81 mg by mouth daily.      . B Complex Vitamins (VITAMIN B COMPLEX PO) Take 1 tablet by mouth daily.      . clopidogrel (PLAVIX) 75 MG tablet take 1 tablet by mouth once daily 90 tablet 3  . esomeprazole (NEXIUM) 40 MG capsule Take 1 capsule (40 mg total) by  mouth daily. 90 capsule 3  . lisinopril (PRINIVIL,ZESTRIL) 10 MG tablet Take 1 tablet (10 mg total) by mouth daily. 90 tablet 3  . lovastatin (MEVACOR) 40 MG tablet Take 1 tablet (40 mg total) by mouth daily. 90 tablet 3  . NEXIUM 40 MG capsule take 1 capsule by mouth once daily 90 capsule 3  . nitroGLYCERIN (NITROSTAT) 0.4 MG SL tablet Place 1 tablet (0.4 mg total) under the tongue every 5 (five) minutes as needed. 30 tablet 5  . Omega-3 Fatty Acids (FISH OIL) 1000 MG CAPS Take 1-2 capsules by mouth daily.      . traMADol (ULTRAM) 50 MG tablet take 1 tablet by mouth  every 6 hours if needed for pain 120 tablet 3  . vitamin E 400 UNIT capsule Take 400 Units by mouth daily.       No current facility-administered medications on file prior to visit.   Review of Systems  Constitutional: Negative for increased diaphoresis, other activity, appetite or other siginficant weight change  HENT: Negative for worsening hearing loss, ear pain, facial swelling, mouth sores and neck stiffness.   Eyes: Negative for other worsening pain, redness or visual disturbance.  Respiratory: Negative for shortness of breath and wheezing.   Cardiovascular: Negative for chest pain and palpitations.  Gastrointestinal: Negative for diarrhea, blood in stool, abdominal distention or other pain Genitourinary: Negative for hematuria, flank pain or change in urine volume.  Musculoskeletal: Negative for myalgias or other joint complaints.  Skin: Negative for color change and wound.  Neurological: Negative for syncope and numbness. other than noted Hematological: Negative for adenopathy. or other swelling Psychiatric/Behavioral: Negative for hallucinations, self-injury, decreased concentration or other worsening agitation.     Objective:   Physical Exam BP 140/82 mmHg  Pulse 57  Temp(Src) 98.1 F (36.7 C) (Oral)  Ht 5\' 4"  (1.626 m)  Wt 164 lb 4 oz (74.503 kg)  BMI 28.18 kg/m2  SpO2 97% VS noted, NAD but is slower in general movement Constitutional: Pt is oriented to person, place, and time. Appears well-developed and well-nourished.  Head: Normocephalic and atraumatic.  Right Ear: External ear normal.  Left Ear: External ear normal.  Nose: Nose normal.  Mouth/Throat: Oropharynx is clear and moist.  Eyes: Conjunctivae and EOM are normal. Pupils are equal, round, and reactive to light.  Neck: Normal range of motion. Neck supple. No JVD present. No tracheal deviation present.  Cardiovascular: Normal rate, regular rhythm, normal heart sounds and intact distal pulses.     Pulmonary/Chest: Effort normal and breath sounds without rales or wheezing  Abdominal: Soft. Bowel sounds are normal. NT. No HSM  Musculoskeletal: Normal range of motion. Exhibits trace to 1+ ankle edema bilat.  Lymphadenopathy:  Has no cervical adenopathy.  Neurological: Pt is alert and oriented to person, place, and time. Pt has normal reflexes. No cranial nerve deficit. Motor grossly intact Skin: Skin is warm and dry. No rash noted.  Psychiatric:  Has normal mood and affect. Behavior is normal.     Assessment & Plan:

## 2014-01-16 ENCOUNTER — Telehealth: Payer: Self-pay | Admitting: Internal Medicine

## 2014-01-16 ENCOUNTER — Other Ambulatory Visit: Payer: Self-pay | Admitting: Internal Medicine

## 2014-01-16 DIAGNOSIS — R222 Localized swelling, mass and lump, trunk: Secondary | ICD-10-CM

## 2014-01-16 NOTE — Assessment & Plan Note (Signed)
Suspect venous insuff related, but also for Echo - r/o diast dysfxn

## 2014-01-16 NOTE — Telephone Encounter (Signed)
Spoke to pt at home  CXR with ? Right paratracheal mass  For CT chest w CM, pulm referral

## 2014-01-16 NOTE — Assessment & Plan Note (Signed)

## 2014-01-16 NOTE — Assessment & Plan Note (Signed)
Etiology unclear, for ENT referral - r/o vocal cord issue, also for cxr as above

## 2014-01-16 NOTE — Addendum Note (Signed)
Addended by: Biagio Borg on: 01/16/2014 06:31 PM   Modules accepted: Miquel Dunn

## 2014-01-16 NOTE — Assessment & Plan Note (Signed)
?   Atypical parkinsons  - for neuro referral

## 2014-01-16 NOTE — Assessment & Plan Note (Addendum)
Etiology unclear, for hip film/pelvic film, pain control,  to f/u any worsening symptoms or concerns,. Also for referral Dr Bohac/sport medicine

## 2014-01-16 NOTE — Assessment & Plan Note (Signed)
New onset, mild, etiology unclear, for PTH level, also CXR

## 2014-01-18 LAB — PTH, INTACT AND CALCIUM
CALCIUM: 9.1 mg/dL (ref 8.4–10.5)
PTH: 69 pg/mL — AB (ref 14–64)

## 2014-01-21 ENCOUNTER — Ambulatory Visit: Payer: Commercial Managed Care - HMO | Admitting: Neurology

## 2014-01-22 ENCOUNTER — Encounter: Payer: Self-pay | Admitting: Neurology

## 2014-01-22 ENCOUNTER — Telehealth: Payer: Self-pay | Admitting: Neurology

## 2014-01-22 NOTE — Telephone Encounter (Signed)
Pt no showed 01/21/14 NP appt w/ Dr. Carles Collet. No show letter + no show policy mailed to pt. Dr. Judi Cong office notified via [facility] referral / Gayleen Orem.

## 2014-01-26 ENCOUNTER — Inpatient Hospital Stay: Admission: RE | Admit: 2014-01-26 | Payer: Commercial Managed Care - HMO | Source: Ambulatory Visit

## 2014-01-27 ENCOUNTER — Ambulatory Visit (INDEPENDENT_AMBULATORY_CARE_PROVIDER_SITE_OTHER)
Admission: RE | Admit: 2014-01-27 | Discharge: 2014-01-27 | Disposition: A | Discharge status: Home / Self Care | Payer: Commercial Managed Care - HMO | Source: Ambulatory Visit | Attending: Internal Medicine | Admitting: Internal Medicine

## 2014-01-27 DIAGNOSIS — J984 Other disorders of lung: Secondary | ICD-10-CM | POA: Diagnosis not present

## 2014-01-27 DIAGNOSIS — J432 Centrilobular emphysema: Secondary | ICD-10-CM | POA: Diagnosis not present

## 2014-01-27 DIAGNOSIS — R222 Localized swelling, mass and lump, trunk: Secondary | ICD-10-CM | POA: Diagnosis not present

## 2014-01-27 MED ORDER — IOHEXOL 300 MG/ML  SOLN
80.0000 mL | Freq: Once | INTRAMUSCULAR | Status: AC | PRN
  Administered 2014-01-27: 80 mL via INTRAVENOUS

## 2014-01-28 ENCOUNTER — Institutional Professional Consult (permissible substitution): Payer: Commercial Managed Care - HMO | Admitting: Internal Medicine

## 2014-02-18 ENCOUNTER — Ambulatory Visit (INDEPENDENT_AMBULATORY_CARE_PROVIDER_SITE_OTHER): Payer: Commercial Managed Care - HMO | Admitting: Neurology

## 2014-02-18 ENCOUNTER — Encounter: Payer: Self-pay | Admitting: Neurology

## 2014-02-18 VITALS — BP 112/70 | HR 68 | Ht 64.0 in | Wt 160.0 lb

## 2014-02-18 DIAGNOSIS — G20 Parkinson's disease: Secondary | ICD-10-CM | POA: Diagnosis not present

## 2014-02-18 NOTE — Progress Notes (Signed)
Brandon Rojas was seen today in the movement disorders clinic for neurologic consultation at the request of [name], MD.  The consultation is for the evaluation of slow movement and to r/o PD.  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    Specific Symptoms:  Tremor: No. Voice: yes, attributes to allergies Sleep: sleeps well  Vivid Dreams:  No.  Acting out dreams:  No. Wet Pillows: No. Postural symptoms:  No.  Falls?  No. Bradykinesia symptoms: no bradykinesia noted Loss of smell:  No. Loss of taste:  No. Urinary Incontinence:  No. Difficulty Swallowing:  No., only when "my sinus flare up on me" Handwriting, micrographia: No. Trouble with ADL's:  No.  Trouble buttoning clothing: No. Depression:  No. Memory changes:  Yes.   (states that had TIA in 2006 and CABG in 2007 and that caused some memory change) Hallucinations:  No.  visual distortions: No. N/V:  No. Lightheaded:  No.  Syncope: No. Diplopia:  No. Dyskinesia:  No.  Neuroimaging has previously been performed.  It was last done when he had a "TIA" in 2006.   PREVIOUS MEDICATIONS: none to date  ALLERGIES:   Allergies  Allergen Reactions  . Atorvastatin Other (See Comments)    Muscle cramps  . Simvastatin Other (See Comments)    Muscle cramps    CURRENT MEDICATIONS:  Outpatient Encounter Prescriptions as of 02/18/2014  Medication Sig  . aspirin 81 MG tablet Take 81 mg by mouth daily.    . B Complex Vitamins (VITAMIN B COMPLEX PO) Take 1 tablet by mouth daily.    . clopidogrel (PLAVIX) 75 MG tablet TAKE 1 TABLET EVERY DAY WITH BREAKFAST  . esomeprazole (NEXIUM) 40 MG capsule TAKE 1 CAPSULE EVERY DAY  . lisinopril (PRINIVIL,ZESTRIL) 10 MG tablet TAKE 1 TABLET EVERY DAY  . lovastatin (MEVACOR) 40 MG tablet TAKE 1 TABLET EVERY DAY  . Omega-3 Fatty Acids (FISH OIL) 1000 MG CAPS Take 1-2 capsules by mouth daily.    . traMADol (ULTRAM) 50 MG tablet take 1 tablet by mouth every 6 hours if  needed for pain  . vitamin E 400 UNIT capsule Take 400 Units by mouth daily.    . nitroGLYCERIN (NITROSTAT) 0.4 MG SL tablet Place 1 tablet (0.4 mg total) under the tongue every 5 (five) minutes as needed. (Patient not taking: Reported on 02/18/2014)  . [DISCONTINUED] esomeprazole (NEXIUM) 40 MG capsule Take 1 capsule (40 mg total) by mouth daily.    PAST MEDICAL HISTORY:   Past Medical History  Diagnosis Date  . Coronary atherosclerosis of unspecified type of vessel, native or graft   . Personal history of unspecified circulatory disease   . Occlusion and stenosis of carotid artery without mention of cerebral infarction   . Unspecified essential hypertension   . Other specified cardiac dysrhythmias(427.89)   . Other and unspecified hyperlipidemia   . Obesity, unspecified   . Anxiety state, unspecified   . Depressive disorder, not elsewhere classified   . Esophageal reflux   . Personal history of colonic polyps   . Acute sinusitis, unspecified   . Diaphragmatic hernia without mention of obstruction or gangrene   . Diverticulosis of colon (without mention of hemorrhage)   . Benign neoplasm of colon   . Personal history of diseases of skin and subcutaneous tissue   . Osteoarthrosis, unspecified whether generalized or localized, unspecified site   . Allergic rhinitis, cause unspecified   . Psychosexual dysfunction with  inhibited sexual excitement   . Hiatal hernia   . Arthritis   . Esophageal stricture     PAST SURGICAL HISTORY:   Past Surgical History  Procedure Laterality Date  . Lung biopsy    . Inguinal hernia repair      bilateral  . Ptca    . Coronary artery bypass graft      x 2  . Colonoscopy    . Polypectomy      SOCIAL HISTORY:   History   Social History  . Marital Status: Married    Spouse Name: N/A  . Number of Children: 3  . Years of Education: N/A   Occupational History  . PASTOR    Social History Main Topics  . Smoking status: Former Smoker     Quit date: 02/19/1972  . Smokeless tobacco: Never Used  . Alcohol Use: No  . Drug Use: No  . Sexual Activity: Not on file   Other Topics Concern  . Not on file   Social History Narrative    FAMILY HISTORY:   Family Status  Relation Status Death Age  . Mother Deceased     MI  . Father Deceased     staph infection, stroke  . Sister Alive     colon cancer  . Sister Alive     healthy  . Sister Alive     healthy  . Brother Alive     Heart disease  . Brother Alive     healthy  . Brother Alive     healthy  . Brother Alive     healthy  . Daughter Alive     healthy  . Daughter Alive     fibromyalgia  . Son Alive     MS    ROS:  A complete 10 system review of systems was obtained and was unremarkable apart from what is mentioned above.  PHYSICAL EXAMINATION:    VITALS:   Filed Vitals:   02/18/14 0951  BP: 112/70  Pulse: 68  Height: 5\' 4"  (1.626 m)  Weight: 160 lb (72.576 kg)    GEN:  The patient appears stated age and is in NAD. HEENT:  Normocephalic, atraumatic.  The mucous membranes are moist. The superficial temporal arteries are without ropiness or tenderness. CV:  RRR with 2/6 SEM Lungs:  CTAB Neck/HEME:  There are no carotid bruits bilaterally.  Neurological examination:  Orientation: The patient is alert and oriented x3. Fund of knowledge is appropriate.  Recent and remote memory are intact.  Attention and concentration are normal.    Able to name objects and repeat phrases. Cranial nerves: There is good facial symmetry. Pupils are equal round and reactive to light bilaterally. Fundoscopic exam reveals clear margins bilaterally. Extraocular muscles are intact. The visual fields are full to confrontational testing. The speech is fluent and clear but he is quite hypophonic. Soft palate rises symmetrically and there is no tongue deviation. Hearing is intact to conversational tone. Sensation: Sensation is intact to light and pinprick throughout (facial, trunk,  extremities). Vibration is intact at the bilateral big toe. There is no extinction with double simultaneous stimulation. There is no sensory dermatomal level identified. Motor: Strength is 5/5 in the bilateral upper and lower extremities.   Shoulder shrug is equal and symmetric.  There is no pronator drift. Deep tendon reflexes: Deep tendon reflexes are 2/4 at the bilateral biceps, triceps, brachioradialis, patella and achilles. Plantar responses are downgoing bilaterally.  Movement examination: Tone: There is  mild increased tone in the bilateral upper extremities.  The tone in the lower extremities is normal.  Abnormal movements: None, even with distraction procedures Coordination:  There is decremation with RAM's, seen on the left with virtually all forms of rapid alternating movements, in the upper extremities and large-based Gait and Station: The patient has no difficulty arising out of a deep-seated chair without the use of the hands. The patient's stride length is normal but he does have decreased arm swing on the left.  The patient has a negative pull test.      ASSESSMENT/PLAN:  1.  Parkinsonism  -The patient likely does have very early Parkinson's disease, although he does not meet all clinical criteria at this time.  He has very mild increased tone and just mild difficulty with rapid alternating movements on the left.  Otherwise, he had very little bradykinesia.  He and I talked about this.  We talked about pathophysiology and prognosis.  We talked about what to expect now as well as in the future.  We talked about the importance of safe, cardiovascular exercise.  He is currently doing this.  He asked questions and I answered them to the best of my ability.  I invited him to our upcoming Parkinson's event in [place].  Greater than 50% of the 60 minute visit was spent in counseling.  We decided not to add any medication.  I will plan on seeing him back in 6 months.  If he has any questions  or concerns before that visit, I'll be happy to see him back sooner.  Obviously, I would want to see him sooner back if any new neurologic issues arise.

## 2014-03-02 ENCOUNTER — Ambulatory Visit (INDEPENDENT_AMBULATORY_CARE_PROVIDER_SITE_OTHER): Payer: Commercial Managed Care - HMO | Admitting: Family Medicine

## 2014-03-02 ENCOUNTER — Encounter: Payer: Self-pay | Admitting: Family Medicine

## 2014-03-02 VITALS — BP 132/82 | HR 61 | Ht 64.0 in | Wt 162.0 lb

## 2014-03-02 DIAGNOSIS — G5702 Lesion of sciatic nerve, left lower limb: Secondary | ICD-10-CM | POA: Diagnosis not present

## 2014-03-02 DIAGNOSIS — M25552 Pain in left hip: Secondary | ICD-10-CM | POA: Diagnosis not present

## 2014-03-02 NOTE — Progress Notes (Signed)
Pre visit review using our clinic review tool, if applicable. No additional management support is needed unless otherwise documented below in the visit note. 

## 2014-03-02 NOTE — Progress Notes (Signed)
  [facility] [address], [place] [address] Phone: [phone] Subjective:    I'm seeing this patient by the request  of:  [name], MD   CC: Left hip pain  [id number] Brandon Rojas is a 69 y.o. male coming in with complaint of left hip pain.  Patient is had this pain for quite some time. Seems to be worse with certain activities as well as weather he states. Patient did see primary care provider and x-rays were taken. X-rays were reviewed by me and shows the patient does have moderate osteophytic changes of the lower lumbar spine as well as the hips bilaterally. This is moderate in severity. Patient states that this does affects some of his daily activities but does not stop him from any activities. Patient denies any radiation down the leg or any numbness or tingling. States that now most the pain seems to be on the posterior lateral aspect of the left hip. Patient states it can have a tightness after sitting for long amount of time and then seems to get better with some activity. Patient denies any significant groin pain.     Past medical history, social, surgical and family history all reviewed in electronic medical record.   Review of Systems: No headache, visual changes, nausea, vomiting, diarrhea, constipation, dizziness, abdominal pain, skin rash, fevers, chills, night sweats, weight loss, swollen lymph nodes, body aches, joint swelling, muscle aches, chest pain, shortness of breath, mood changes.   Objective Blood pressure 132/82, pulse 61, height 5\' 4"  (1.626 m), weight 162 lb (73.483 kg), SpO2 97 %.  General: No apparent distress alert and oriented x3 mood and affect normal, dressed appropriately.  HEENT: Pupils equal, extraocular movements intact  Respiratory: Patient's speak in full sentences and does not appear short of breath  Cardiovascular: No lower extremity edema, non tender, no erythema  Skin: Warm dry intact with no signs of  infection or rash on extremities or on axial skeleton.  Abdomen: Soft nontender  Neuro: Cranial nerves II through XII are intact, neurovascularly intact in all extremities with 2+ DTRs and 2+ pulses.  Lymph: No lymphadenopathy of posterior or anterior cervical chain or axillae bilaterally.  Gait normal with good balance and coordination.  MSK:  Non tender with full range of motion and good stability and symmetric strength and tone of shoulders, elbows, wrist, , knee and ankles bilaterally.  Hip: Left ROM IR: 15 Deg but symmetric to contralateral side, ER: 45 Deg, Flexion: 120 Deg, Extension: 100 Deg, Abduction: 45 Deg, Adduction: 45 Deg Strength IR: 5/5, ER: 5/5, Flexion: 5/5, Extension: 5/5, Abduction: 4/5, Adduction: 4/5 Pelvic alignment unremarkable to inspection and palpation. Standing hip rotation and gait without trendelenburg sign / unsteadiness. Greater trochanter without tenderness to palpation. Tenderness over the piriformis Positive Faber pain with internal rotation. No SI joint tenderness and normal minimal SI movement.   Procedure note 29937; 15 minutes spent for Therapeutic exercises as stated in above notes.  This included exercises focusing on stretching, strengthening, with significant focus on eccentric aspects.   Proper technique shown and discussed handout in great detail with ATC.  All questions were discussed and answered.     Impression and Recommendations:     This case required medical decision making of moderate complexity.

## 2014-03-02 NOTE — Patient Instructions (Addendum)
Good to see you.   Ice 20 minutes 2 times daily. Usually after activity and before bed. Exercises 3 times a week.  tennisball in back left pocket with sitting a long amount of time.  Take tylenol 500 mg three times a day is the best evidence based medicine we have for arthritis.  Glucosamine sulfate 1500mg   a day is a supplement that has been shown to help moderate to severe arthritis. Vitamin D 2000 IU daily Iron 325mg  3 times a week can help with the spasms.  Capsaicin topically up to four times a day may also help with pain. Cortisone injections are an option if these interventions do not seem to make a difference or need more relief.  It's important that you continue to stay active. Controlling your weight is important.  Come back and see me in 3-4 weeks.

## 2014-03-02 NOTE — Assessment & Plan Note (Signed)
Patient's left hip pain is also osteophytic changes but I do think the patient does have some piriformis syndrome. We discussed icing regimen, home exercises, as well as other vitamin supplementations with patient wondering to avoid any prescribed medications. Patient does have a history of hypercalcemia that this continues we may want to consider further workup for this as well. I do think the vitamin D deficiency could be causing some of this and we'll put patient on vitamin D. Patient given home exercise by the athletic trainer and then will come in and see me again in 3-4 weeks for further evaluation and treatment.

## 2014-03-02 NOTE — Assessment & Plan Note (Signed)

## 2014-03-23 ENCOUNTER — Ambulatory Visit: Payer: Commercial Managed Care - HMO | Admitting: Internal Medicine

## 2014-03-26 ENCOUNTER — Encounter: Payer: Self-pay | Admitting: Family Medicine

## 2014-03-26 ENCOUNTER — Ambulatory Visit (INDEPENDENT_AMBULATORY_CARE_PROVIDER_SITE_OTHER): Payer: Commercial Managed Care - HMO | Admitting: Family Medicine

## 2014-03-26 VITALS — BP 120/72 | HR 60 | Ht 64.0 in | Wt 159.0 lb

## 2014-03-26 DIAGNOSIS — G5702 Lesion of sciatic nerve, left lower limb: Secondary | ICD-10-CM | POA: Diagnosis not present

## 2014-03-26 NOTE — Progress Notes (Signed)
Pre visit review using our clinic review tool, if applicable. No additional management support is needed unless otherwise documented below in the visit note. 

## 2014-03-26 NOTE — Progress Notes (Signed)
  [facility] [place], [place] [address] Phone: [phone] Subjective:    CC: Left hip pain follow up  [id number] Brandon Rojas is a 69 y.o. male coming in with complaint of left hip pain.  Patient is had this pain for quite some time. Seems to be worse with certain activities as well as weather he states. Patient did see primary care provider and x-rays were taken. X-rays were reviewed by me and shows the patient does have moderate osteophytic changes of the lower lumbar spine as well as the hips bilaterally. This is moderate in severity.   Patient was seen 2-3 weeks ago and was diagnosed with more of a piriformis syndrome. Patient was given home exercises, manual massage techniques, icing protocol, we discussed over-the-counter medications. Patient states as long as he does the icing as well as the home exercises in the tennis ball for manual massage she does fairly well. Patient states strenuous activities to some mild discomfort. Denies any radiation down the leg or any numbness or tingling. States that it is very manageable at this time.    Past medical history, social, surgical and family history all reviewed in electronic medical record.   Review of Systems: No headache, visual changes, nausea, vomiting, diarrhea, constipation, dizziness, abdominal pain, skin rash, fevers, chills, night sweats, weight loss, swollen lymph nodes, body aches, joint swelling, muscle aches, chest pain, shortness of breath, mood changes.   Objective Blood pressure 120/72, pulse 60, height 5\' 4"  (1.626 m), weight 159 lb (72.122 kg).  General: No apparent distress alert and oriented x3 mood and affect normal, dressed appropriately.  HEENT: Pupils equal, extraocular movements intact  Respiratory: Patient's speak in full sentences and does not appear short of breath  Cardiovascular: No lower extremity edema, non tender, no erythema  Skin: Warm dry intact with no  signs of infection or rash on extremities or on axial skeleton.  Abdomen: Soft nontender  Neuro: Cranial nerves II through XII are intact, neurovascularly intact in all extremities with 2+ DTRs and 2+ pulses.  Lymph: No lymphadenopathy of posterior or anterior cervical chain or axillae bilaterally.  Gait normal with good balance and coordination.  MSK:  Non tender with full range of motion and good stability and symmetric strength and tone of shoulders, elbows, wrist, , knee and ankles bilaterally.  Hip: Left ROM IR: 15 Deg but symmetric to contralateral side, ER: 45 Deg, Flexion: 120 Deg, Extension: 100 Deg, Abduction: 45 Deg, Adduction: 45 Deg Strength IR: 5/5, ER: 5/5, Flexion: 5/5, Extension: 5/5, Abduction: 4/5, Adduction: 4/5 tight hamstrings bilaterally.  Pelvic alignment unremarkable to inspection and palpation. Standing hip rotation and gait without trendelenburg sign / unsteadiness. Greater trochanter without tenderness to palpation. Tenderness over the piriformis still present but improved.  Positive Faber pain  No SI joint tenderness and normal minimal SI movement.      Impression and Recommendations:     This case required medical decision making of moderate complexity.

## 2014-03-26 NOTE — Patient Instructions (Signed)
Good to see you Ice is your friend and the tennis ball Continue the stretches after activity Continue the vitmains Be proud of what you have done you are doing great!!!! See me again in 6 weeks if not perfect!

## 2014-03-26 NOTE — Assessment & Plan Note (Signed)
Patient overall is doing much better. Encourage patient to continue the conservative therapy at this time. If any worsening symptoms he'll come back earlier. Differential does include lumbar radiculopathy or patient's osteoarthritis of his hip but this is not stopping him from any activity and he is resting comfortably at night. Patient come back in 6 weeks for further evaluation.

## 2014-03-29 ENCOUNTER — Ambulatory Visit (HOSPITAL_COMMUNITY): Payer: Commercial Managed Care - HMO | Attending: Internal Medicine | Admitting: Cardiology

## 2014-03-29 ENCOUNTER — Other Ambulatory Visit (HOSPITAL_COMMUNITY): Payer: Self-pay | Admitting: Internal Medicine

## 2014-03-29 DIAGNOSIS — R600 Localized edema: Secondary | ICD-10-CM | POA: Diagnosis not present

## 2014-03-29 DIAGNOSIS — R609 Edema, unspecified: Secondary | ICD-10-CM

## 2014-03-29 NOTE — Progress Notes (Signed)
Echo performed. 

## 2014-03-30 ENCOUNTER — Encounter: Payer: Self-pay | Admitting: Internal Medicine

## 2014-03-30 DIAGNOSIS — I272 Pulmonary hypertension, unspecified: Secondary | ICD-10-CM | POA: Insufficient documentation

## 2014-04-05 ENCOUNTER — Other Ambulatory Visit: Payer: Self-pay | Admitting: Internal Medicine

## 2014-04-06 NOTE — Telephone Encounter (Signed)
Done hardcopy to Brandon Rojas 

## 2014-04-06 NOTE — Telephone Encounter (Signed)
Rx was sent to pharmacy. 

## 2014-04-16 ENCOUNTER — Encounter: Payer: Self-pay | Admitting: Internal Medicine

## 2014-04-16 ENCOUNTER — Ambulatory Visit (INDEPENDENT_AMBULATORY_CARE_PROVIDER_SITE_OTHER): Payer: Commercial Managed Care - HMO | Admitting: Internal Medicine

## 2014-04-16 VITALS — BP 118/68 | HR 60 | Temp 98.1°F | Resp 18 | Ht 64.0 in | Wt 158.8 lb

## 2014-04-16 DIAGNOSIS — F528 Other sexual dysfunction not due to a substance or known physiological condition: Secondary | ICD-10-CM | POA: Diagnosis not present

## 2014-04-16 DIAGNOSIS — I10 Essential (primary) hypertension: Secondary | ICD-10-CM

## 2014-04-16 DIAGNOSIS — F411 Generalized anxiety disorder: Secondary | ICD-10-CM

## 2014-04-16 NOTE — Progress Notes (Signed)
Subjective:    Patient ID: Brandon Rojas, male    DOB: 08-Nov-1945, 69 y.o.   MRN: [id number]  HPI  Here to f/u; overall doing ok,  Pt denies chest pain, increasing sob or doe, wheezing, orthopnea, PND, increased LE swelling, palpitations, dizziness or syncope.  Pt denies new neurological symptoms such as new headache, or facial or extremity weakness or numbness.  Pt denies polydipsia, polyuria, or low sugar episode.   Pt denies new neurological symptoms such as new headache, or facial or extremity weakness or numbness.   Pt states overall good compliance with meds, mostly trying to follow appropriate diet.  Pt with possiblly early parkinsonism, not requiring tx so far. And left lower back is improved with PT and other tx/ice/supplements.  Asks for viagra for worsening ED symtpoms last 6 mo. Denies worsening depressive symptoms, suicidal ideation, or panic Past Medical History  Diagnosis Date  . Coronary atherosclerosis of unspecified type of vessel, native or graft   . Personal history of unspecified circulatory disease   . Occlusion and stenosis of carotid artery without mention of cerebral infarction   . Unspecified essential hypertension   . Other specified cardiac dysrhythmias(427.89)   . Other and unspecified hyperlipidemia   . Obesity, unspecified   . Anxiety state, unspecified   . Depressive disorder, not elsewhere classified   . Esophageal reflux   . Personal history of colonic polyps   . Acute sinusitis, unspecified   . Diaphragmatic hernia without mention of obstruction or gangrene   . Diverticulosis of colon (without mention of hemorrhage)   . Benign neoplasm of colon   . Personal history of diseases of skin and subcutaneous tissue   . Osteoarthrosis, unspecified whether generalized or localized, unspecified site   . Allergic rhinitis, cause unspecified   . Psychosexual dysfunction with inhibited sexual excitement   . Hiatal hernia   . Arthritis   . Esophageal stricture   .  Pulmonary hypertension 03/30/2014   Past Surgical History  Procedure Laterality Date  . Lung biopsy    . Inguinal hernia repair      bilateral  . Ptca    . Coronary artery bypass graft      x 2  . Colonoscopy    . Polypectomy      reports that he quit smoking about 42 years ago. He has never used smokeless tobacco. He reports that he does not drink alcohol or use illicit drugs. family history includes Colon cancer in his sister. Allergies  Allergen Reactions  . Atorvastatin Other (See Comments)    Muscle cramps  . Simvastatin Other (See Comments)    Muscle cramps   Current Outpatient Prescriptions on File Prior to Visit  Medication Sig Dispense Refill  . aspirin 81 MG tablet Take 81 mg by mouth daily.      . B Complex Vitamins (VITAMIN B COMPLEX PO) Take 1 tablet by mouth daily.      . clopidogrel (PLAVIX) 75 MG tablet TAKE 1 TABLET EVERY DAY WITH BREAKFAST 90 tablet 3  . esomeprazole (NEXIUM) 40 MG capsule TAKE 1 CAPSULE EVERY DAY 90 capsule 3  . lisinopril (PRINIVIL,ZESTRIL) 10 MG tablet TAKE 1 TABLET EVERY DAY 90 tablet 3  . lovastatin (MEVACOR) 40 MG tablet TAKE 1 TABLET EVERY DAY 90 tablet 3  . nitroGLYCERIN (NITROSTAT) 0.4 MG SL tablet Place 1 tablet (0.4 mg total) under the tongue every 5 (five) minutes as needed. 30 tablet 5  . Omega-3 Fatty Acids (FISH OIL)  1000 MG CAPS Take 1-2 capsules by mouth daily.      . traMADol (ULTRAM) 50 MG tablet TAKE 1 TABLET EVERY 6 HOURS AS NEEDED FOR PAIN 120 tablet 3  . vitamin E 400 UNIT capsule Take 400 Units by mouth daily.       No current facility-administered medications on file prior to visit.   Review of Systems  Constitutional: Negative for unusual diaphoresis or night sweats HENT: Negative for ringing in ear or discharge Eyes: Negative for double vision or worsening visual disturbance.  Respiratory: Negative for choking and stridor.   Gastrointestinal: Negative for vomiting or other signifcant bowel change Genitourinary:  Negative for hematuria or change in urine volume.  Musculoskeletal: Negative for other MSK pain or swelling Skin: Negative for color change and worsening wound.  Neurological: Negative for tremors and numbness other than noted  Psychiatric/Behavioral: Negative for decreased concentration or agitation other than above       Objective:   Physical Exam BP 118/68 mmHg  Pulse 60  Temp(Src) 98.1 F (36.7 C) (Oral)  Resp 18  Ht 5\' 4"  (1.626 m)  Wt 158 lb 12.8 oz (72.031 kg)  BMI 27.24 kg/m2  SpO2 97% VS noted,  Constitutional: Pt appears in no significant distress HENT: Head: NCAT.  Right Ear: External ear normal.  Left Ear: External ear normal.  Eyes: . Pupils are equal, round, and reactive to light. Conjunctivae and EOM are normal Neck: Normal range of motion. Neck supple.  Cardiovascular: Normal rate and regular rhythm.   Pulmonary/Chest: Effort normal and breath sounds without rales or wheezing.  Neurological: Pt is alert. Not confused , motor grossly intact Skin: Skin is warm. No rash, no LE edema Psychiatric: Pt behavior is normal. No agitation.     Assessment & Plan:

## 2014-04-16 NOTE — Assessment & Plan Note (Signed)
stable overall by history and exam, recent data reviewed with pt, and pt to continue medical treatment as before,  to f/u any worsening symptoms or concerns BP Readings from Last 3 Encounters:  04/16/14 118/68  03/26/14 120/72  03/02/14 132/82

## 2014-04-16 NOTE — Assessment & Plan Note (Signed)
D/w pt, declines viagra on second thought due to cost,  to f/u any worsening symptoms or concerns

## 2014-04-16 NOTE — Assessment & Plan Note (Signed)
stable overall by history and exam, recent data reviewed with pt, and pt to continue medical treatment as before,  to f/u any worsening symptoms or concerns Lab Results  Component Value Date   WBC 7.8 01/13/2014   HGB 13.7 01/13/2014   HCT 41.9 01/13/2014   PLT 292.0 01/13/2014   GLUCOSE 95 01/13/2014   CHOL 137 01/13/2014   TRIG 82.0 01/13/2014   HDL 53.20 01/13/2014   LDLCALC 67 01/13/2014   ALT 16 01/13/2014   AST 23 01/13/2014   NA 142 01/13/2014   K 4.2 01/13/2014   CL 107 01/13/2014   CREATININE 1.3 01/13/2014   BUN 19 01/13/2014   CO2 30 01/13/2014   TSH 2.25 01/13/2014   PSA 1.57 01/13/2014   INR 1.0 03/06/2010

## 2014-04-16 NOTE — Patient Instructions (Signed)
Please continue all other medications as before, and refills have been done if requested.  Please have the pharmacy call with any other refills you may need.  Please continue your efforts at being more active, low cholesterol diet, and weight control.  Please keep your appointments with your specialists as you may have planned  Please call if you chanage your mind about the prescription for viagra  Please return in 6 months, or sooner if needed

## 2014-05-07 ENCOUNTER — Ambulatory Visit: Payer: Commercial Managed Care - HMO | Admitting: Internal Medicine

## 2014-08-11 ENCOUNTER — Encounter: Payer: Self-pay | Admitting: Internal Medicine

## 2014-08-19 ENCOUNTER — Ambulatory Visit (INDEPENDENT_AMBULATORY_CARE_PROVIDER_SITE_OTHER): Payer: Commercial Managed Care - HMO | Admitting: Neurology

## 2014-08-19 ENCOUNTER — Encounter: Payer: Self-pay | Admitting: Neurology

## 2014-08-19 VITALS — BP 90/62 | HR 58 | Ht 64.0 in | Wt 155.0 lb

## 2014-08-19 DIAGNOSIS — G20 Parkinson's disease: Secondary | ICD-10-CM

## 2014-08-19 MED ORDER — CARBIDOPA-LEVODOPA 25-100 MG PO TABS: 1.0000 | ORAL_TABLET | Freq: Three times a day (TID) | ORAL | Status: DC

## 2014-08-19 NOTE — Progress Notes (Signed)
Brandon Rojas was seen today in the movement disorders clinic for neurologic consultation at the request of [name], MD.  The consultation is for the evaluation of slow movement and to r/o PD.  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    08/19/14 update: The patient is following up today.  Last visit, the patient had signs of early Parkinson's disease, but he did not yet meet official criteria for Parkinson's disease.  He is not on any medications.  I reviewed his other records since last visit.  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  Pt states that it is still flaring intermittently.  When he isn't "hurting" he moves well.  No falls.  No tremor.  No dizziness.  Doing some walking on the treadmill but it "flares up" his piriformis syndrome.    Neuroimaging has previously been performed.  It was last done when he had a "TIA" in 2006.   PREVIOUS MEDICATIONS: none to date  ALLERGIES:   Allergies  Allergen Reactions  . Atorvastatin Other (See Comments)    Muscle cramps  . Simvastatin Other (See Comments)    Muscle cramps    CURRENT MEDICATIONS:  Outpatient Encounter Prescriptions as of 08/19/2014  Medication Sig  . aspirin 81 MG tablet Take 81 mg by mouth daily.    . B Complex Vitamins (VITAMIN B COMPLEX PO) Take 1 tablet by mouth daily.    . clopidogrel (PLAVIX) 75 MG tablet TAKE 1 TABLET EVERY DAY WITH BREAKFAST  . esomeprazole (NEXIUM) 40 MG capsule TAKE 1 CAPSULE EVERY DAY  . lisinopril (PRINIVIL,ZESTRIL) 10 MG tablet TAKE 1 TABLET EVERY DAY  . lovastatin (MEVACOR) 40 MG tablet TAKE 1 TABLET EVERY DAY  . Omega-3 Fatty Acids (FISH OIL) 1000 MG CAPS Take 1-2 capsules by mouth daily.    . traMADol (ULTRAM) 50 MG tablet TAKE 1 TABLET EVERY 6 HOURS AS NEEDED FOR PAIN  . vitamin E 400 UNIT capsule Take 400 Units by mouth daily.    . nitroGLYCERIN (NITROSTAT) 0.4 MG SL tablet Place 1 tablet (0.4 mg total) under the tongue every 5 (five)  minutes as needed. (Patient not taking: Reported on 08/19/2014)   No facility-administered encounter medications on file as of 08/19/2014.    PAST MEDICAL HISTORY:   Past Medical History  Diagnosis Date  . Coronary atherosclerosis of unspecified type of vessel, native or graft   . Personal history of unspecified circulatory disease   . Occlusion and stenosis of carotid artery without mention of cerebral infarction   . Unspecified essential hypertension   . Other specified cardiac dysrhythmias(427.89)   . Other and unspecified hyperlipidemia   . Obesity, unspecified   . Anxiety state, unspecified   . Depressive disorder, not elsewhere classified   . Esophageal reflux   . Personal history of colonic polyps   . Acute sinusitis, unspecified   . Diaphragmatic hernia without mention of obstruction or gangrene   . Diverticulosis of colon (without mention of hemorrhage)   . Benign neoplasm of colon   . Personal history of diseases of skin and subcutaneous tissue   . Osteoarthrosis, unspecified whether generalized or localized, unspecified site   . Allergic rhinitis, cause unspecified   . Psychosexual dysfunction with inhibited sexual excitement   . Hiatal hernia   . Arthritis   . Esophageal stricture   . Pulmonary hypertension 03/30/2014    PAST SURGICAL HISTORY:   Past Surgical History  Procedure Laterality Date  . Lung biopsy    . Inguinal hernia repair      bilateral  . Ptca    . Coronary artery bypass graft      x 2  . Colonoscopy    . Polypectomy      SOCIAL HISTORY:   Social History   Social History  . Marital Status: Married    Spouse Name: N/A  . Number of Children: 3  . Years of Education: N/A   Occupational History  . PASTOR    Social History Main Topics  . Smoking status: Former Smoker    Quit date: 02/19/1972  . Smokeless tobacco: Never Used  . Alcohol Use: No  . Drug Use: No  . Sexual Activity: Not on file   Other Topics Concern  . Not on file    Social History Narrative    FAMILY HISTORY:   Family Status  Relation Status Death Age  . Mother Deceased     MI  . Father Deceased     staph infection, stroke  . Sister Alive     colon cancer  . Sister Alive     healthy  . Sister Alive     healthy  . Brother Alive     Heart disease  . Brother Alive     healthy  . Brother Alive     healthy  . Brother Alive     healthy  . Daughter Alive     healthy  . Daughter Alive     fibromyalgia  . Son Alive     MS    ROS:  A complete 10 system review of systems was obtained and was unremarkable apart from what is mentioned above.  PHYSICAL EXAMINATION:    VITALS:   Filed Vitals:   08/19/14 1003  BP: 90/62  Pulse: 58  Height: 5\' 4"  (1.626 m)  Weight: 155 lb (70.308 kg)    GEN:  The patient appears stated age and is in NAD. HEENT:  Normocephalic, atraumatic.  The mucous membranes are moist. The superficial temporal arteries are without ropiness or tenderness. CV:  RRR with 2/6 SEM Lungs:  CTAB Neck/HEME:  There are no carotid bruits bilaterally.  Neurological examination:  Orientation: The patient is alert and oriented x3.  Cranial nerves: There is good facial symmetry. There is facial hyopomimia.   The speech is fluent and clear but he is quite hypophonic. Soft palate rises symmetrically and there is no tongue deviation. Hearing is intact to conversational tone. Sensation: Sensation is intact to light touch throughout. Motor: Strength is 5/5 in the bilateral upper and lower extremities.   Shoulder shrug is equal and symmetric.  There is no pronator drift.   Movement examination: Tone: There is mild - mod increased tone in the LUE. Abnormal movements: None, even with distraction procedures Coordination:  There is decremation with RAM's, seen on the left with virtually all forms of rapid alternating movements, in the upper extremities and lower extremities Gait and Station: The patient has no difficulty arising out  of a deep-seated chair without the use of the hands. The patient's stride length is normal but he does have decreased arm swing bilaterally, L more than right.  t.      ASSESSMENT/PLAN:  1.  Idiopathic Parkinson's disease, newly diagnosed August 19, 2014  -Long discussion with the patient today.  The patient does now need formal criteria for the diagnosis of idiopathic Parkinson's disease.  Much greater than 50%  of the 40 minute visit was spent in counseling.  We talked about what to expect as well as in the future.  We talked about pathophysiology of the disease.  We talked about medical as well as surgical options.  We decided to add carbidopa/levodopa 25/100, and slowly work up to one tablet 3 times per day.  Risks, benefits, side effects and alternative therapies were discussed.  The opportunity to ask questions was given and they were answered to the best of my ability.  The patient expressed understanding and willingness to follow the outlined treatment protocols.  -Talked about the importance of safe, cardiovascular exercise.  Talked about the importance a stationary bike. 2.  History of hypertension  -We did discuss the fact that Parkinson's disease itself can lower blood pressure.  He asked me today about the fact that his blood pressure is low.  He is also on blood pressure medication.  We will need to watch this closely.  He may be on the ACEI for some other reason (renal protection, etc) but given the fact that both PD and levodopa can drop BP, we will need to watch for him becoming symptomatic. 3.  Follow up is anticipated in the next few months, sooner should new neurologic issues arise.  Much greater than 50% of this visit was spent in counseling with the patient.  Total face to face time:  40 min

## 2014-08-19 NOTE — Patient Instructions (Signed)
1. Start Carbidopa Levodopa as follows: 1/2 tab three times a day before meals x 1 wk, then 1/2 in am & noon & 1 in evening for a week, then 1/2 in am &1 at noon &one in evening for a week, then 1 tablet three times a day before meals.    

## 2014-09-16 ENCOUNTER — Telehealth: Payer: Self-pay | Admitting: *Deleted

## 2014-09-16 NOTE — Telephone Encounter (Signed)
Patient has a question about his medication (blood pressure) Call back number [phone]

## 2014-09-16 NOTE — Telephone Encounter (Signed)
Patient called in reference to one of his medications  Call back number [phone]

## 2014-09-16 NOTE — Telephone Encounter (Signed)
Spoke with patient he states he has felt very faint since Saturday and stopped his BP medication on Sunday. He stated he started taking it again today. When he woke up his blood pressure was 153/75. After he took BP medication it dropped to 107/62. Dr Jenny Reichmann manages patient's blood pressure medication. I advised him to call their office to discuss medication management. He will call if needed.

## 2014-09-16 NOTE — Telephone Encounter (Signed)
Left message on machine for patient to call back.

## 2014-09-17 ENCOUNTER — Ambulatory Visit (INDEPENDENT_AMBULATORY_CARE_PROVIDER_SITE_OTHER): Payer: Commercial Managed Care - HMO | Admitting: Cardiology

## 2014-09-17 ENCOUNTER — Encounter: Payer: Self-pay | Admitting: Cardiology

## 2014-09-17 VITALS — BP 116/58 | HR 64 | Ht 64.0 in | Wt 155.0 lb

## 2014-09-17 DIAGNOSIS — I2510 Atherosclerotic heart disease of native coronary artery without angina pectoris: Secondary | ICD-10-CM

## 2014-09-17 DIAGNOSIS — I340 Nonrheumatic mitral (valve) insufficiency: Secondary | ICD-10-CM | POA: Diagnosis not present

## 2014-09-17 DIAGNOSIS — I10 Essential (primary) hypertension: Secondary | ICD-10-CM

## 2014-09-17 NOTE — Progress Notes (Signed)
HPI  The patient presents for follow up of  coronary artery disease status post CABG in 2006. Last cardiac catheterization was 2012 in which he had a patent LIMA to the LAD, patent SVG to the PLA, and a small-caliber disease involving the first diagonal and first marginal that were not optimal for percutaneous intervention. He had good LV function.   He did have some edema recently and was sent for an echocardiogram. I reviewed these records. He had some moderate mitral regurgitation. Otherwise he had normal LV function. There was some mildly elevated pulmonary pressures. He otherwise has done well. 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  The patient denies any new symptoms such as chest discomfort, neck or arm discomfort. There has been no new shortness of breath, PND or orthopnea. There have been no reported palpitations or syncope.  He has been diagnosed with Parkinson's recently. He's been started on medications and he's felt a little bit nauseated with this. He's also noticed his blood pressure being lower and he's been lightheaded.  Allergies  Allergen Reactions  . Atorvastatin Other (See Comments)    Muscle cramps  . Simvastatin Other (See Comments)    Muscle cramps    Current Outpatient Prescriptions  Medication Sig Dispense Refill  . aspirin 81 MG tablet Take 81 mg by mouth daily.      . B Complex Vitamins (VITAMIN B COMPLEX PO) Take 1 tablet by mouth daily.      . carbidopa-levodopa (SINEMET IR) 25-100 MG per tablet Take 1 tablet by mouth 3 (three) times daily. 270 tablet 1  . clopidogrel (PLAVIX) 75 MG tablet TAKE 1 TABLET EVERY DAY WITH BREAKFAST 90 tablet 3  . esomeprazole (NEXIUM) 40 MG capsule TAKE 1 CAPSULE EVERY DAY 90 capsule 3  . lisinopril (PRINIVIL,ZESTRIL) 10 MG tablet TAKE 1 TABLET EVERY DAY 90 tablet 3  . lovastatin (MEVACOR) 40 MG tablet TAKE 1 TABLET EVERY DAY 90 tablet 3  . nitroGLYCERIN (NITROSTAT) 0.4 MG SL tablet Place 1 tablet (0.4 mg total) under the  tongue every 5 (five) minutes as needed. 30 tablet 5  . Omega-3 Fatty Acids (FISH OIL) 1000 MG CAPS Take 1-2 capsules by mouth daily.      . traMADol (ULTRAM) 50 MG tablet TAKE 1 TABLET EVERY 6 HOURS AS NEEDED FOR PAIN 120 tablet 3  . vitamin E 400 UNIT capsule Take 400 Units by mouth daily.       No current facility-administered medications for this visit.    Past Medical History  Diagnosis Date  . Coronary atherosclerosis of unspecified type of vessel, native or graft   . Personal history of unspecified circulatory disease   . Occlusion and stenosis of carotid artery without mention of cerebral infarction   . Unspecified essential hypertension   . Other specified cardiac dysrhythmias(427.89)   . Other and unspecified hyperlipidemia   . Obesity, unspecified   . Anxiety state, unspecified   . Depressive disorder, not elsewhere classified   . Esophageal reflux   . Personal history of colonic polyps   . Acute sinusitis, unspecified   . Diaphragmatic hernia without mention of obstruction or gangrene   . Diverticulosis of colon (without mention of hemorrhage)   . Benign neoplasm of colon   . Personal history of diseases of skin and subcutaneous tissue   . Osteoarthrosis, unspecified whether generalized or localized, unspecified site   . Allergic rhinitis, cause unspecified   . Psychosexual dysfunction with inhibited sexual excitement   .  Hiatal hernia   . Arthritis   . Esophageal stricture   . Pulmonary hypertension 03/30/2014    Past Surgical History  Procedure Laterality Date  . Lung biopsy    . Inguinal hernia repair      bilateral  . Ptca    . Coronary artery bypass graft      x 2  . Colonoscopy    . Polypectomy      ROS:  As stated in the HPI and negative for all other systems.  PHYSICAL EXAM BP 116/58 mmHg  Pulse 64  Ht 5\' 4"  (1.626 m)  Wt 155 lb (70.308 kg)  BMI 26.59 kg/m2 GENERAL:  Well appearing HEENT:  Pupils equal round and reactive, fundi not  visualized, oral mucosa unremarkable NECK:  No jugular venous distention, waveform within normal limits, carotid upstroke brisk and symmetric, no bruits, no thyromegaly LUNGS:  Clear to auscultation bilaterally CHEST:  Well healed sternotomy scar. HEART:  PMI not displaced or sustained,S1 and S2 within normal limits, no S3, no S4, no clicks, no rubs, 2 out of 6 apical holosystolic murmur radiating slightly to the axilla, no diastolic murmurs ABD:  Flat, positive bowel sounds normal in frequency in pitch, no bruits, no rebound, no guarding, no midline pulsatile mass, no hepatomegaly, no splenomegaly EXT:  2 plus pulses throughout, no edema, no cyanosis no clubbing   EKG:  Sinus bradycardia, rate 64, axis within normal limits, intervals within normal limits, no acute ST-T wave changes. Premature ectopic complexes.  09/17/2014   ASSESSMENT AND PLAN  CAD:  The patient has no new sypmtoms.  No further cardiovascular testing is indicated.  We will continue with aggressive risk reduction and meds as listed.  HYPERLIPIDEMIA:    His LDL in January was 30 with an HDL of 53. I reviewed the labs. He will remain on the meds as listed.  HTN:  His blood pressure is actually running low. And reduce his lisinopril to 5 mg daily.  CAROTID STENOSIS:  He had some mild plaque on Doppler in 2015.  No further imaging is indicated.  MR:  This is moderate and I will follow this clinically and with repeat echoes.

## 2014-09-17 NOTE — Patient Instructions (Signed)
Your physician wants you to follow-up in: 1 Year. You will receive a reminder letter in the mail two months in advance. If you don't receive a letter, please call our office to schedule the follow-up appointment.  Your physician has recommended you make the following change in your medication: Decrease Lisinopril to 1/2 tablets a day.

## 2014-09-21 ENCOUNTER — Other Ambulatory Visit: Payer: Self-pay | Admitting: Cardiology

## 2014-10-18 ENCOUNTER — Telehealth: Payer: Self-pay

## 2014-10-18 NOTE — Telephone Encounter (Signed)
Spoke with the spouse regarding AWV; the patient was sleeping but did agree to see if he wanted an apt. Fup this Thursday and will call the office if interested.

## 2014-10-21 ENCOUNTER — Encounter: Payer: Self-pay | Admitting: Internal Medicine

## 2014-10-21 ENCOUNTER — Ambulatory Visit (INDEPENDENT_AMBULATORY_CARE_PROVIDER_SITE_OTHER): Payer: Commercial Managed Care - HMO | Admitting: Internal Medicine

## 2014-10-21 VITALS — BP 118/70 | HR 53 | Temp 97.8°F | Ht 64.0 in | Wt 157.0 lb

## 2014-10-21 DIAGNOSIS — I10 Essential (primary) hypertension: Secondary | ICD-10-CM | POA: Diagnosis not present

## 2014-10-21 DIAGNOSIS — F329 Major depressive disorder, single episode, unspecified: Secondary | ICD-10-CM | POA: Diagnosis not present

## 2014-10-21 DIAGNOSIS — F32A Depression, unspecified: Secondary | ICD-10-CM

## 2014-10-21 DIAGNOSIS — Z23 Encounter for immunization: Secondary | ICD-10-CM | POA: Diagnosis not present

## 2014-10-21 NOTE — Assessment & Plan Note (Signed)
stable overall by history and exam, recent data reviewed with pt, and pt to continue medical treatment as before,  to f/u any worsening symptoms or concerns Lab Results  Component Value Date   WBC 7.8 01/13/2014   HGB 13.7 01/13/2014   HCT 41.9 01/13/2014   PLT 292.0 01/13/2014   GLUCOSE 95 01/13/2014   CHOL 137 01/13/2014   TRIG 82.0 01/13/2014   HDL 53.20 01/13/2014   LDLCALC 67 01/13/2014   ALT 16 01/13/2014   AST 23 01/13/2014   NA 142 01/13/2014   K 4.2 01/13/2014   CL 107 01/13/2014   CREATININE 1.3 01/13/2014   BUN 19 01/13/2014   CO2 30 01/13/2014   TSH 2.25 01/13/2014   PSA 1.57 01/13/2014   INR 1.0 03/06/2010    

## 2014-10-21 NOTE — Patient Instructions (Addendum)
You had the flu shot today  Please continue all other medications as before, and refills have been done if requested.  Please have the pharmacy call with any other refills you may need.  Please continue your efforts at being more active, low cholesterol diet, and weight control.  You are otherwise up to date with prevention measures today.  Please keep your appointments with your specialists as you may have planned  Please return in 3 months, or sooner if needed, with Lab testing done 3-5 days before  

## 2014-10-21 NOTE — Assessment & Plan Note (Signed)
Etiology unclear, f/u calcium normal, PTH not signfiicantly elevated, cont to follow

## 2014-10-21 NOTE — Progress Notes (Signed)
Subjective:    Patient ID: Brandon Rojas, male    DOB: [birth year], 69 y.o.   MRN: [id number]  HPI  Here to f/u; overall doing ok,  Pt denies chest pain, increasing sob or doe, wheezing, orthopnea, PND, increased LE swelling, palpitations, dizziness or syncope.  Pt denies new neurological symptoms such as new headache, or facial or extremity weakness or numbness.  Pt denies polydipsia, polyuria, or low sugar episode.   Pt denies new neurological symptoms such as new headache, or facial or extremity weakness or numbness.   Pt states overall good compliance with meds, mostly trying to follow appropriate diet, with wt overall stable Wt Readings from Last 3 Encounters:  10/21/14 157 lb (71.215 kg)  09/17/14 155 lb (70.308 kg)  08/19/14 155 lb (70.308 kg)  Pyriformis pain now resolved. Parkinson new dx with sinamet, working well and he is qiute pleased.  Lisinopril to 5 mg daily.  Past Medical History  Diagnosis Date  . Coronary atherosclerosis of unspecified type of vessel, native or graft   . Personal history of unspecified circulatory disease   . Unspecified essential hypertension   . Other and unspecified hyperlipidemia   . Obesity, unspecified   . Anxiety state, unspecified   . Depressive disorder, not elsewhere classified   . Esophageal reflux   . Personal history of colonic polyps   . Diaphragmatic hernia without mention of obstruction or gangrene   . Diverticulosis of colon (without mention of hemorrhage)   . Benign neoplasm of colon   . Osteoarthrosis, unspecified whether generalized or localized, unspecified site   . Hiatal hernia   . Arthritis   . Esophageal stricture   . Parkinson's disease ([place])   . Mitral regurgitation    Past Surgical History  Procedure Laterality Date  . Lung biopsy    . Inguinal hernia repair      bilateral  . Ptca    . Coronary artery bypass graft      x 2  . Colonoscopy    . Polypectomy      reports that he quit smoking about 42 years ago. He  has never used smokeless tobacco. He reports that he does not drink alcohol or use illicit drugs. family history includes Colon cancer in his sister. Allergies  Allergen Reactions  . Atorvastatin Other (See Comments)    Muscle cramps  . Simvastatin Other (See Comments)    Muscle cramps   Current Outpatient Prescriptions on File Prior to Visit  Medication Sig Dispense Refill  . aspirin 81 MG tablet Take 81 mg by mouth daily.      . B Complex Vitamins (VITAMIN B COMPLEX PO) Take 1 tablet by mouth daily.      . carbidopa-levodopa (SINEMET IR) 25-100 MG per tablet Take 1 tablet by mouth 3 (three) times daily. 270 tablet 1  . clopidogrel (PLAVIX) 75 MG tablet TAKE 1 TABLET EVERY DAY WITH BREAKFAST 90 tablet 3  . esomeprazole (NEXIUM) 40 MG capsule TAKE 1 CAPSULE EVERY DAY 90 capsule 3  . lisinopril (PRINIVIL,ZESTRIL) 10 MG tablet TAKE 1 TABLET EVERY DAY (Patient taking differently: TAKE 0.5 TABLET EVERY DAY) 90 tablet 3  . lovastatin (MEVACOR) 40 MG tablet TAKE 1 TABLET EVERY DAY 90 tablet 3  . NITROSTAT 0.4 MG SL tablet PLACE 1 TABLET (0.4 MG TOTAL) UNDER THE TONGUE EVERY 5 (FIVE) MINUTES AS NEEDED. 25 tablet 5  . Omega-3 Fatty Acids (FISH OIL) 1000 MG CAPS Take 1-2 capsules by mouth daily.      [name]  traMADol (ULTRAM) 50 MG tablet TAKE 1 TABLET EVERY 6 HOURS AS NEEDED FOR PAIN 120 tablet 3  . vitamin E 400 UNIT capsule Take 400 Units by mouth daily.       No current facility-administered medications on file prior to visit.    Review of Systems  Constitutional: Negative for unusual diaphoresis or night sweats HENT: Negative for ringing in ear or discharge Eyes: Negative for double vision or worsening visual disturbance.  Respiratory: Negative for choking and stridor.   Gastrointestinal: Negative for vomiting or other signifcant bowel change Genitourinary: Negative for hematuria or change in urine volume.  Musculoskeletal: Negative for other MSK pain or swelling Skin: Negative for color  change and worsening wound.  Neurological: Negative for tremors and numbness other than noted  Psychiatric/Behavioral: Negative for decreased concentration or agitation other than above       Objective:   Physical Exam BP 118/70 mmHg  Pulse 53  Temp(Src) 97.8 F (36.6 C) (Oral)  Ht 5\' 4"  (1.626 m)  Wt 157 lb (71.215 kg)  BMI 26.94 kg/m2  SpO2 97% VS noted,  Constitutional: Pt appears in no significant distress HENT: Head: NCAT.  Right Ear: External ear normal.  Left Ear: External ear normal.  Eyes: . Pupils are equal, round, and reactive to light. Conjunctivae and EOM are normal Neck: Normal range of motion. Neck supple.  Cardiovascular: Normal rate and regular rhythm.   Pulmonary/Chest: Effort normal and breath sounds without rales or wheezing.  Neurological: Pt is alert. Not confused , motor grossly intact, somewhat slowed movements but fluid without significant bradykinesia Skin: Skin is warm. No rash, no LE edema Psychiatric: Pt behavior is normal. No agitation.      Assessment & Plan:

## 2014-10-21 NOTE — Progress Notes (Signed)
Pre visit review using our clinic review tool, if applicable. No additional management support is needed unless otherwise documented below in the visit note. 

## 2014-10-21 NOTE — Assessment & Plan Note (Signed)
stable overall by history and exam, recent data reviewed with pt, and pt to continue medical treatment as before,  to f/u any worsening symptoms or concerns BP Readings from Last 3 Encounters:  10/21/14 118/70  09/17/14 116/58  08/19/14 90/62

## 2014-11-22 ENCOUNTER — Other Ambulatory Visit: Payer: Self-pay | Admitting: Internal Medicine

## 2014-11-22 ENCOUNTER — Other Ambulatory Visit: Payer: Self-pay

## 2014-11-22 MED ORDER — ESOMEPRAZOLE MAGNESIUM 40 MG PO CPDR: 40.0000 mg | DELAYED_RELEASE_CAPSULE | Freq: Every day | ORAL | Status: DC

## 2014-11-23 ENCOUNTER — Encounter: Payer: Self-pay | Admitting: Neurology

## 2014-11-23 ENCOUNTER — Ambulatory Visit (INDEPENDENT_AMBULATORY_CARE_PROVIDER_SITE_OTHER): Payer: Commercial Managed Care - HMO | Admitting: Neurology

## 2014-11-23 VITALS — BP 100/60 | HR 60 | Ht 64.0 in | Wt 157.0 lb

## 2014-11-23 DIAGNOSIS — G20A1 Parkinson's disease without dyskinesia, without mention of fluctuations: Secondary | ICD-10-CM

## 2014-11-23 DIAGNOSIS — G20 Parkinson's disease: Secondary | ICD-10-CM

## 2014-11-23 DIAGNOSIS — I10 Essential (primary) hypertension: Secondary | ICD-10-CM

## 2014-11-23 NOTE — Progress Notes (Signed)
Brandon Rojas was seen today in the movement disorders clinic for neurologic consultation at the request of [name], MD.  The consultation is for the evaluation of slow movement and to r/o PD.  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    08/19/14 update: The patient is following up today.  Last visit, the patient had signs of early Parkinson's disease, but he did not yet meet official criteria for Parkinson's disease.  He is not on any medications.  I reviewed his other records since last visit.  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  Pt states that it is still flaring intermittently.  When he isn't "hurting" he moves well.  No falls.  No tremor.  No dizziness.  Doing some walking on the treadmill but it "flares up" his piriformis syndrome.    11/23/14 update:  The patient follows up today regarding his newly diagnosed Parkinson's disease.  Last visit, I started him on carbidopa/levodopa 25/100, one tablet 3 times per day.  States that "I am doing great."  States that he is walking better.  No falls.   I also talked to him about his lisinopril.  He does have history of hypertension but he is having near syncopal episodes.  He did see cardiology last visit and I reviewed those records.  His lisinopril dosage was decreased on 09/17/2014.  No lightheadedness.  Has rare nausea and not sure if associated with the medication.  He has no wearing off of the carbidopa/levodopa 25/100.  He takes it at 8am/12pm/5pm.    Neuroimaging has previously been performed.  It was last done when he had a "TIA" in 2006.   PREVIOUS MEDICATIONS: none to date  ALLERGIES:   Allergies  Allergen Reactions  . Atorvastatin Other (See Comments)    Muscle cramps  . Simvastatin Other (See Comments)    Muscle cramps    CURRENT MEDICATIONS:  Outpatient Encounter Prescriptions as of 11/23/2014  Medication Sig  . aspirin 81 MG tablet Take 81 mg by mouth daily.    . B  Complex Vitamins (VITAMIN B COMPLEX PO) Take 1 tablet by mouth daily.    . carbidopa-levodopa (SINEMET IR) 25-100 MG per tablet Take 1 tablet by mouth 3 (three) times daily.  . clopidogrel (PLAVIX) 75 MG tablet TAKE 1 TABLET EVERY DAY WITH BREAKFAST  . esomeprazole (NEXIUM) 40 MG capsule TAKE 1 CAPSULE EVERY DAY  . lisinopril (PRINIVIL,ZESTRIL) 10 MG tablet TAKE 1 TABLET EVERY DAY (Patient taking differently: TAKE 0.5 TABLET EVERY DAY)  . lovastatin (MEVACOR) 40 MG tablet TAKE 1 TABLET EVERY DAY  . Omega-3 Fatty Acids (FISH OIL) 1000 MG CAPS Take 1-2 capsules by mouth daily.    . traMADol (ULTRAM) 50 MG tablet TAKE 1 TABLET EVERY 6 HOURS AS NEEDED FOR PAIN  . vitamin E 400 UNIT capsule Take 400 Units by mouth daily.    [name] NITROSTAT 0.4 MG SL tablet PLACE 1 TABLET (0.4 MG TOTAL) UNDER THE TONGUE EVERY 5 (FIVE) MINUTES AS NEEDED. (Patient not taking: Reported on 11/23/2014)  . [DISCONTINUED] esomeprazole (NEXIUM) 40 MG capsule Take 1 capsule (40 mg total) by mouth daily.   No facility-administered encounter medications on file as of 11/23/2014.    PAST MEDICAL HISTORY:   Past Medical History  Diagnosis Date  . Coronary atherosclerosis of unspecified type of vessel, native or graft   . Personal history of unspecified circulatory disease   . Unspecified  essential hypertension   . Other and unspecified hyperlipidemia   . Obesity, unspecified   . Anxiety state, unspecified   . Depressive disorder, not elsewhere classified   . Esophageal reflux   . Personal history of colonic polyps   . Diaphragmatic hernia without mention of obstruction or gangrene   . Diverticulosis of colon (without mention of hemorrhage)   . Benign neoplasm of colon   . Osteoarthrosis, unspecified whether generalized or localized, unspecified site   . Hiatal hernia   . Arthritis   . Esophageal stricture   . Parkinson's disease ([place])   . Mitral regurgitation     PAST SURGICAL HISTORY:   Past Surgical History    Procedure Laterality Date  . Lung biopsy    . Inguinal hernia repair      bilateral  . Ptca    . Coronary artery bypass graft      x 2  . Colonoscopy    . Polypectomy      SOCIAL HISTORY:   Social History   Social History  . Marital Status: Married    Spouse Name: N/A  . Number of Children: 3  . Years of Education: N/A   Occupational History  . PASTOR    Social History Main Topics  . Smoking status: Former Smoker    Quit date: 02/19/1972  . Smokeless tobacco: Never Used  . Alcohol Use: No  . Drug Use: No  . Sexual Activity: Not on file   Other Topics Concern  . Not on file   Social History Narrative    FAMILY HISTORY:   Family Status  Relation Status Death Age  . Mother Deceased     MI  . Father Deceased     staph infection, stroke  . Sister Alive     colon cancer  . Sister Alive     healthy  . Sister Alive     healthy  . Brother Alive     Heart disease  . Brother Alive     healthy  . Brother Alive     healthy  . Brother Alive     healthy  . Daughter Alive     healthy  . Daughter Alive     fibromyalgia  . Son Alive     MS    ROS:  A complete 10 system review of systems was obtained and was unremarkable apart from what is mentioned above.  PHYSICAL EXAMINATION:    VITALS:   Filed Vitals:   11/23/14 1012  BP: 100/60  Pulse: 60  Height: 5\' 4"  (1.626 m)  Weight: 157 lb (71.215 kg)    GEN:  The patient appears stated age and is in NAD. HEENT:  Normocephalic, atraumatic.  The mucous membranes are moist. The superficial temporal arteries are without ropiness or tenderness. CV:  RRR with 2/6 SEM Lungs:  CTAB Neck/HEME:  There are no carotid bruits bilaterally.  Neurological examination:  Orientation: The patient is alert and oriented x3.  Cranial nerves: There is good facial symmetry. There is facial hyopomimia.   The speech is fluent and clear but he is quite hypophonic. Soft palate rises symmetrically and there is no tongue  deviation. Hearing is intact to conversational tone. Sensation: Sensation is intact to light touch throughout. Motor: Strength is 5/5 in the bilateral upper and lower extremities.   Shoulder shrug is equal and symmetric.  There is no pronator drift.   Movement examination: Tone: There is good tremor.   Abnormal movements:  None, even with distraction procedures Coordination:  There is no decremation today Gait and Station: The patient has no difficulty arising out of a deep-seated chair without the use of the hands. The patient's stride length is normal but he does have decreased arm swing on the left.      ASSESSMENT/PLAN:  1.  Idiopathic Parkinson's disease, newly diagnosed August 19, 2014  -Feels much better on carbidopa/levodopa 25/100 tid and looks better as well.  -Talked about the importance of safe, cardiovascular exercise.  Talked about the importance a stationary bike.  Talked to him about dedicated exercise time as currently considers exercise as his movement in his everyday life.   2.  History of hypertension  -on a decreased dose of lisinopril.  3.  Follow up is anticipated in the next few months, sooner should new neurologic issues arise.  Much greater than 50% of this visit was spent in counseling with the patient.  Total face to face time:  25 min

## 2015-01-14 DIAGNOSIS — J209 Acute bronchitis, unspecified: Secondary | ICD-10-CM | POA: Diagnosis not present

## 2015-01-25 ENCOUNTER — Ambulatory Visit (INDEPENDENT_AMBULATORY_CARE_PROVIDER_SITE_OTHER): Payer: Commercial Managed Care - HMO | Admitting: Internal Medicine

## 2015-01-25 ENCOUNTER — Other Ambulatory Visit (INDEPENDENT_AMBULATORY_CARE_PROVIDER_SITE_OTHER): Payer: Commercial Managed Care - HMO

## 2015-01-25 ENCOUNTER — Encounter: Payer: Self-pay | Admitting: Internal Medicine

## 2015-01-25 VITALS — BP 112/84 | HR 57 | Temp 98.3°F | Resp 20 | Ht 64.0 in | Wt 153.5 lb

## 2015-01-25 DIAGNOSIS — Z0000 Encounter for general adult medical examination without abnormal findings: Secondary | ICD-10-CM

## 2015-01-25 LAB — URINALYSIS, ROUTINE W REFLEX MICROSCOPIC
BILIRUBIN URINE: NEGATIVE
Hgb urine dipstick: NEGATIVE
Leukocytes, UA: NEGATIVE
Nitrite: NEGATIVE
PH: 6 (ref 5.0–8.0)
RBC / HPF: NONE SEEN (ref 0–?)
Specific Gravity, Urine: 1.025 (ref 1.000–1.030)
TOTAL PROTEIN, URINE-UPE24: NEGATIVE
URINE GLUCOSE: NEGATIVE
UROBILINOGEN UA: 1 (ref 0.0–1.0)
WBC, UA: NONE SEEN (ref 0–?)

## 2015-01-25 LAB — CBC WITH DIFFERENTIAL/PLATELET
Basophils Absolute: 0.1 10*3/uL (ref 0.0–0.1)
Basophils Relative: 0.8 % (ref 0.0–3.0)
EOS PCT: 3 % (ref 0.0–5.0)
Eosinophils Absolute: 0.3 10*3/uL (ref 0.0–0.7)
HCT: 43.7 % (ref 39.0–52.0)
Hemoglobin: 14.5 g/dL (ref 13.0–17.0)
LYMPHS ABS: 1.5 10*3/uL (ref 0.7–4.0)
Lymphocytes Relative: 15.8 % (ref 12.0–46.0)
MCHC: 33.1 g/dL (ref 30.0–36.0)
MCV: 89.4 fl (ref 78.0–100.0)
Monocytes Absolute: 0.6 10*3/uL (ref 0.1–1.0)
Monocytes Relative: 5.7 % (ref 3.0–12.0)
NEUTROS ABS: 7.2 10*3/uL (ref 1.4–7.7)
NEUTROS PCT: 74.7 % (ref 43.0–77.0)
Platelets: 284 10*3/uL (ref 150.0–400.0)
RBC: 4.88 Mil/uL (ref 4.22–5.81)
RDW: 14.1 % (ref 11.5–15.5)
WBC: 9.6 10*3/uL (ref 4.0–10.5)

## 2015-01-25 LAB — PSA: PSA: 1.65 ng/mL (ref 0.10–4.00)

## 2015-01-25 LAB — HEPATIC FUNCTION PANEL
ALK PHOS: 71 U/L (ref 39–117)
ALT: 3 U/L (ref 0–53)
AST: 13 U/L (ref 0–37)
Albumin: 3.7 g/dL (ref 3.5–5.2)
BILIRUBIN DIRECT: 0.1 mg/dL (ref 0.0–0.3)
BILIRUBIN TOTAL: 0.4 mg/dL (ref 0.2–1.2)
Total Protein: 6.4 g/dL (ref 6.0–8.3)

## 2015-01-25 LAB — BASIC METABOLIC PANEL
BUN: 14 mg/dL (ref 6–23)
CALCIUM: 9.6 mg/dL (ref 8.4–10.5)
CO2: 31 mEq/L (ref 19–32)
Chloride: 103 mEq/L (ref 96–112)
Creatinine, Ser: 1.22 mg/dL (ref 0.40–1.50)
GFR: 62.29 mL/min (ref >60.00)
Glucose, Bld: 89 mg/dL (ref 70–99)
Potassium: 4.8 mEq/L (ref 3.5–5.1)
SODIUM: 140 meq/L (ref 135–145)

## 2015-01-25 LAB — TSH: TSH: 1.82 u[IU]/mL (ref 0.35–4.50)

## 2015-01-25 LAB — LIPID PANEL
Cholesterol: 125 mg/dL (ref 0–200)
HDL: 39.6 mg/dL (ref >39.00)
LDL CALC: 57 mg/dL (ref 0–99)
NonHDL: 85.01
Total CHOL/HDL Ratio: 3
Triglycerides: 139 mg/dL (ref 0.0–149.0)
VLDL: 27.8 mg/dL (ref 0.0–40.0)

## 2015-01-25 NOTE — Patient Instructions (Signed)

## 2015-01-25 NOTE — Assessment & Plan Note (Signed)

## 2015-01-25 NOTE — Progress Notes (Signed)
Pre visit review using our clinic review tool, if applicable. No additional management support is needed unless otherwise documented below in the visit note. 

## 2015-01-25 NOTE — Progress Notes (Signed)
Subjective:    Patient ID: Brandon Rojas, male    DOB: 10/05/1945, 70 y.o.   MRN: [id number]  HPI Here for wellness and f/u;  Overall doing ok;  Pt denies Chest pain, worsening SOB, DOE, wheezing, orthopnea, PND, worsening LE edema, palpitations, dizziness or syncope.  Pt denies neurological change such as new headache, facial or extremity weakness.  Pt denies polydipsia, polyuria, or low sugar symptoms. Pt states overall good compliance with treatment and medications, good tolerability, and has been trying to follow appropriate diet.  Pt denies worsening depressive symptoms, suicidal ideation or panic. No fever, night sweats, wt loss, loss of appetite, or other constitutional symptoms.  Pt states good ability with ADL's, has low fall risk, home safety reviewed and adequate, no other significant changes in hearing or vision, and only occasionally active with exercise, has mild PC followed per Dr Tat.neurology.  Did have recent bronchitis now finished antbx yesterday with minimal nonprod cough only. Past Medical History  Diagnosis Date  . Coronary atherosclerosis of unspecified type of vessel, native or graft   . Personal history of unspecified circulatory disease   . Unspecified essential hypertension   . Other and unspecified hyperlipidemia   . Obesity, unspecified   . Anxiety state, unspecified   . Depressive disorder, not elsewhere classified   . Esophageal reflux   . Personal history of colonic polyps   . Diaphragmatic hernia without mention of obstruction or gangrene   . Diverticulosis of colon (without mention of hemorrhage)   . Benign neoplasm of colon   . Osteoarthrosis, unspecified whether generalized or localized, unspecified site   . Hiatal hernia   . Arthritis   . Esophageal stricture   . Parkinson's disease ([place])   . Mitral regurgitation    Past Surgical History  Procedure Laterality Date  . Lung biopsy    . Inguinal hernia repair      bilateral  . Ptca    . Coronary  artery bypass graft      x 2  . Colonoscopy    . Polypectomy      reports that he quit smoking about 42 years ago. He has never used smokeless tobacco. He reports that he does not drink alcohol or use illicit drugs. family history includes Colon cancer in his sister. Allergies  Allergen Reactions  . Atorvastatin Other (See Comments)    Muscle cramps  . Simvastatin Other (See Comments)    Muscle cramps   Current Outpatient Prescriptions on File Prior to Visit  Medication Sig Dispense Refill  . aspirin 81 MG tablet Take 81 mg by mouth daily.      . B Complex Vitamins (VITAMIN B COMPLEX PO) Take 1 tablet by mouth daily.      . carbidopa-levodopa (SINEMET IR) 25-100 MG per tablet Take 1 tablet by mouth 3 (three) times daily. 270 tablet 1  . clopidogrel (PLAVIX) 75 MG tablet TAKE 1 TABLET EVERY DAY WITH BREAKFAST 90 tablet 3  . esomeprazole (NEXIUM) 40 MG capsule TAKE 1 CAPSULE EVERY DAY 90 capsule 3  . lisinopril (PRINIVIL,ZESTRIL) 10 MG tablet TAKE 1 TABLET EVERY DAY (Patient taking differently: TAKE 0.5 TABLET EVERY DAY) 90 tablet 3  . lovastatin (MEVACOR) 40 MG tablet TAKE 1 TABLET EVERY DAY 90 tablet 3  . NITROSTAT 0.4 MG SL tablet PLACE 1 TABLET (0.4 MG TOTAL) UNDER THE TONGUE EVERY 5 (FIVE) MINUTES AS NEEDED. 25 tablet 5  . Omega-3 Fatty Acids (FISH OIL) 1000 MG CAPS Take 1-2  capsules by mouth daily.      . traMADol (ULTRAM) 50 MG tablet TAKE 1 TABLET EVERY 6 HOURS AS NEEDED FOR PAIN 120 tablet 3  . vitamin E 400 UNIT capsule Take 400 Units by mouth daily.       No current facility-administered medications on file prior to visit.    Review of Systems Constitutional: Negative for increased diaphoresis, other activity, appetite or siginficant weight change other than noted HENT: Negative for worsening hearing loss, ear pain, facial swelling, mouth sores and neck stiffness.   Eyes: Negative for other worsening pain, redness or visual disturbance.  Respiratory: Negative for shortness  of breath and wheezing  Cardiovascular: Negative for chest pain and palpitations.  Gastrointestinal: Negative for diarrhea, blood in stool, abdominal distention or other pain Genitourinary: Negative for hematuria, flank pain or change in urine volume.  Musculoskeletal: Negative for myalgias or other joint complaints.  Skin: Negative for color change and wound or drainage.  Neurological: Negative for syncope and numbness. other than noted Hematological: Negative for adenopathy. or other swelling Psychiatric/Behavioral: Negative for hallucinations, SI, self-injury, decreased concentration or other worsening agitation.      Objective:   Physical Exam BP 112/84 mmHg  Pulse 57  Temp(Src) 98.3 F (36.8 C) (Oral)  Resp 20  Ht 5\' 4"  (1.626 m)  Wt 153 lb 8 oz (69.627 kg)  BMI 26.34 kg/m2  SpO2 97% VS noted,  Constitutional: Pt is oriented to person, place, and time. Appears well-developed and well-nourished, in no significant distress Head: Normocephalic and atraumatic.  Right Ear: External ear normal.  Left Ear: External ear normal.  Nose: Nose normal.  Mouth/Throat: Oropharynx is clear and moist.  Eyes: Conjunctivae and EOM are normal. Pupils are equal, round, and reactive to light.  Neck: Normal range of motion. Neck supple. No JVD present. No tracheal deviation present or significant neck LA or mass Cardiovascular: Normal rate, regular rhythm, normal heart sounds and intact distal pulses.   Pulmonary/Chest: Effort normal and breath sounds without rales or wheezing  Abdominal: Soft. Bowel sounds are normal. NT. No HSM  Musculoskeletal: Normal range of motion. Exhibits no edema.  Lymphadenopathy:  Has no cervical adenopathy.  Neurological: Pt is alert and oriented to person, place, and time. Pt has normal reflexes. No cranial nerve deficit. Motor grossly intact Skin: Skin is warm and dry. No rash noted.  Psychiatric:  Has normal mood and affect. Behavior is normal.          Assessment & Plan:

## 2015-01-26 LAB — HEPATITIS C ANTIBODY: HCV Ab: NEGATIVE

## 2015-02-10 ENCOUNTER — Other Ambulatory Visit: Payer: Self-pay | Admitting: Internal Medicine

## 2015-02-10 ENCOUNTER — Other Ambulatory Visit: Payer: Self-pay | Admitting: Neurology

## 2015-02-10 NOTE — Telephone Encounter (Signed)
Carbidopa Levodopa refill requested. Per last office note- patient to remain on medication. Refill approved and sent to patient's pharmacy.   

## 2015-03-22 ENCOUNTER — Ambulatory Visit (INDEPENDENT_AMBULATORY_CARE_PROVIDER_SITE_OTHER): Payer: Commercial Managed Care - HMO | Admitting: Neurology

## 2015-03-22 ENCOUNTER — Encounter: Payer: Self-pay | Admitting: Neurology

## 2015-03-22 VITALS — BP 102/78 | HR 63 | Ht 65.0 in | Wt 150.0 lb

## 2015-03-22 DIAGNOSIS — I10 Essential (primary) hypertension: Secondary | ICD-10-CM

## 2015-03-22 DIAGNOSIS — G20 Parkinson's disease: Secondary | ICD-10-CM

## 2015-03-22 NOTE — Progress Notes (Signed)
Brandon Rojas was seen today in the movement disorders clinic for neurologic consultation at the request of [name], MD.  The consultation is for the evaluation of slow movement and to r/o PD.  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    08/19/14 update: The patient is following up today.  Last visit, the patient had signs of early Parkinson's disease, but he did not yet meet official criteria for Parkinson's disease.  He is not on any medications.  I reviewed his other records since last visit.  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  Pt states that it is still flaring intermittently.  When he isn't "hurting" he moves well.  No falls.  No tremor.  No dizziness.  Doing some walking on the treadmill but it "flares up" his piriformis syndrome.    11/23/14 update:  The patient follows up today regarding his newly diagnosed Parkinson's disease.  Last visit, I started him on carbidopa/levodopa 25/100, one tablet 3 times per day.  States that "I am doing great."  States that he is walking better.  No falls.   I also talked to him about his lisinopril.  He does have history of hypertension but he is having near syncopal episodes.  He did see cardiology last visit and I reviewed those records.  His lisinopril dosage was decreased on 09/17/2014.  No lightheadedness.  Has rare nausea and not sure if associated with the medication.  He has no wearing off of the carbidopa/levodopa 25/100.  He takes it at 8am/12pm/5pm.    03/22/15 update:  Pt f/u today for PD.    The records that were made available to me were reviewed since last visit.  Pt is on carbidopa/levodopa 25/100 tid.  He gets a little nauseated with the first pill but if he takes it with [place] it helps.  He doesn't think that it changes the efficacy of the medication at all.    Wearing off:  No.  How long before next dose:  n/a Falls:   No. Hallucinations:  No.  visual distortions: No. Lightheaded:   No.  Syncope: No. Dyskinesia:  No.  Exercise:  Some, bikes some at home but knows that if he gets to the gym he feels better and walks better.   Neuroimaging has previously been performed.  It was last done when he had a "TIA" in 2006.   PREVIOUS MEDICATIONS: none to date  ALLERGIES:   Allergies  Allergen Reactions  . Atorvastatin Other (See Comments)    Muscle cramps  . Simvastatin Other (See Comments)    Muscle cramps    CURRENT MEDICATIONS:  Outpatient Encounter Prescriptions as of 03/22/2015  Medication Sig  . aspirin 81 MG tablet Take 81 mg by mouth daily.    . B Complex Vitamins (VITAMIN B COMPLEX PO) Take 1 tablet by mouth daily.    . carbidopa-levodopa (SINEMET IR) 25-100 MG tablet TAKE 1 TABLET THREE TIMES DAILY  . clopidogrel (PLAVIX) 75 MG tablet TAKE 1 TABLET EVERY DAY WITH BREAKFAST  . esomeprazole (NEXIUM) 40 MG capsule TAKE 1 CAPSULE EVERY DAY  . lisinopril (PRINIVIL,ZESTRIL) 10 MG tablet TAKE 1 TABLET EVERY DAY  . lovastatin (MEVACOR) 40 MG tablet TAKE 1 TABLET EVERY DAY  . NITROSTAT 0.4 MG SL tablet PLACE 1 TABLET (0.4 MG TOTAL) UNDER THE TONGUE EVERY 5 (FIVE) MINUTES AS NEEDED.  [name] Omega-3 Fatty Acids (FISH OIL) 1000 MG CAPS Take  1-2 capsules by mouth daily.    . traMADol (ULTRAM) 50 MG tablet TAKE 1 TABLET EVERY 6 HOURS AS NEEDED FOR PAIN  . vitamin E 400 UNIT capsule Take 400 Units by mouth daily.     No facility-administered encounter medications on file as of 03/22/2015.    PAST MEDICAL HISTORY:   Past Medical History  Diagnosis Date  . Coronary atherosclerosis of unspecified type of vessel, native or graft   . Personal history of unspecified circulatory disease   . Unspecified essential hypertension   . Other and unspecified hyperlipidemia   . Obesity, unspecified   . Anxiety state, unspecified   . Depressive disorder, not elsewhere classified   . Esophageal reflux   . Personal history of colonic polyps   . Diaphragmatic hernia without mention of  obstruction or gangrene   . Diverticulosis of colon (without mention of hemorrhage)   . Benign neoplasm of colon   . Osteoarthrosis, unspecified whether generalized or localized, unspecified site   . Hiatal hernia   . Arthritis   . Esophageal stricture   . Parkinson's disease ([place])   . Mitral regurgitation     PAST SURGICAL HISTORY:   Past Surgical History  Procedure Laterality Date  . Lung biopsy    . Inguinal hernia repair      bilateral  . Ptca    . Coronary artery bypass graft      x 2  . Colonoscopy    . Polypectomy      SOCIAL HISTORY:   Social History   Social History  . Marital Status: Married    Spouse Name: N/A  . Number of Children: 3  . Years of Education: N/A   Occupational History  . PASTOR    Social History Main Topics  . Smoking status: Former Smoker    Quit date: 02/19/1972  . Smokeless tobacco: Never Used  . Alcohol Use: No  . Drug Use: No  . Sexual Activity: Not on file   Other Topics Concern  . Not on file   Social History Narrative    FAMILY HISTORY:   Family Status  Relation Status Death Age  . Mother Deceased     MI  . Father Deceased     staph infection, stroke  . Sister Alive     colon cancer  . Sister Alive     healthy  . Sister Alive     healthy  . Brother Alive     Heart disease  . Brother Alive     healthy  . Brother Alive     healthy  . Brother Alive     healthy  . Daughter Alive     healthy  . Daughter Alive     fibromyalgia  . Son Alive     MS    ROS:  A complete 10 system review of systems was obtained and was unremarkable apart from what is mentioned above.  PHYSICAL EXAMINATION:    VITALS:   Filed Vitals:   03/22/15 1023  BP: 102/78  Pulse: 63  Height: 5\' 5"  (1.651 m)  Weight: 150 lb (68.04 kg)   Wt Readings from Last 3 Encounters:  03/22/15 150 lb (68.04 kg)  01/25/15 153 lb 8 oz (69.627 kg)  11/23/14 157 lb (71.215 kg)     GEN:  The patient appears stated age and is in NAD. HEENT:   Normocephalic, atraumatic.  The mucous membranes are moist. The superficial temporal arteries are without ropiness or  tenderness. CV:  RRR with 2/6 SEM Lungs:  CTAB Neck/HEME:  There are no carotid bruits bilaterally.  Neurological examination:  Orientation: The patient is alert and oriented x3.  Cranial nerves: There is good facial symmetry. There is facial hyopomimia.   The speech is fluent and clear but he is quite hypophonic. Soft palate rises symmetrically and there is no tongue deviation. Hearing is intact to conversational tone. Sensation: Sensation is intact to light touch throughout. Motor: Strength is 5/5 in the bilateral upper and lower extremities.   Shoulder shrug is equal and symmetric.  There is no pronator drift.   Movement examination: Tone: There is no tremor.   Abnormal movements: None, even with distraction procedures Coordination:  There is trouble with alternation of supination/pronation of the forearm bilaterally, hand opening and closing on the left and toe taps on the left Gait and Station: The patient has no difficulty arising out of a deep-seated chair without the use of the hands. The patient's stride length is normal and arm swing is good today   ASSESSMENT/PLAN:  1.  Idiopathic Parkinson's disease, newly diagnosed August 19, 2014  -Feels much better on carbidopa/levodopa 25/100 tid and looks better as well.  -Talked about the importance of safe, cardiovascular exercise.  Talked again about regular exercise 2.  History of hypertension  -on a decreased dose of lisinopril but may be able to d/c that in the future and told him to talk to PCP about that. 3.  Follow up is anticipated in the next few months, sooner should new neurologic issues arise.  Much greater than 50% of this visit was spent in counseling with the patient.  Total face to face time:  25 min

## 2015-05-16 DIAGNOSIS — M25552 Pain in left hip: Secondary | ICD-10-CM | POA: Diagnosis not present

## 2015-06-23 ENCOUNTER — Encounter: Payer: Self-pay | Admitting: Internal Medicine

## 2015-06-23 ENCOUNTER — Ambulatory Visit (INDEPENDENT_AMBULATORY_CARE_PROVIDER_SITE_OTHER): Payer: Commercial Managed Care - HMO | Admitting: Internal Medicine

## 2015-06-23 VITALS — BP 118/72 | HR 53 | Resp 20 | Wt 150.0 lb

## 2015-06-23 DIAGNOSIS — M5416 Radiculopathy, lumbar region: Secondary | ICD-10-CM

## 2015-06-23 DIAGNOSIS — I10 Essential (primary) hypertension: Secondary | ICD-10-CM

## 2015-06-23 DIAGNOSIS — E785 Hyperlipidemia, unspecified: Secondary | ICD-10-CM

## 2015-06-23 MED ORDER — LOVASTATIN 20 MG PO TABS: 20.0000 mg | ORAL_TABLET | Freq: Every day | ORAL | Status: DC

## 2015-06-23 NOTE — Patient Instructions (Signed)
Ok to stop the lisinopril  OK to cut back on the lovastatin to 20 mg per day  Please continue all other medications as before, and refills have been done if requested.  Please have the pharmacy call with any other refills you may need.  Please continue your efforts at being more active, low cholesterol diet, and weight control.  Please keep your appointments with your specialists as you may have planned  You will be contacted regarding the referral for: MRI lower back., and orthopedic  Please return in 6 months, or sooner if needed, with Lab testing done 3-5 days before

## 2015-06-23 NOTE — Progress Notes (Signed)
Pre visit review using our clinic review tool, if applicable. No additional management support is needed unless otherwise documented below in the visit note. 

## 2015-06-23 NOTE — Progress Notes (Signed)
Subjective:    Patient ID: Brandon Rojas, male    DOB: [birth year], 70 y.o.   MRN: [id number]  HPI  Here to f/u, Pt c/o new onset but  >3 mo mod to severe constant LBP with radiation across the back and to the LLE but no bowel or bladder change, fever, wt loss, gait change or falls.  Has had some LLE weakness as well, not sure about numbness.  Worse to get up from chair, but also worse to stand for > 15 min, not better with tramadol but doesn't like to take as it can make him sedated. Pt denies chest pain, increased sob or doe, wheezing, orthopnea, PND, increased LE swelling, palpitations, dizziness or syncope.  Pt denies new neurological symptoms such as new headache, or facial or extremity weakness or numbness except for the above.  Has f/u appt Dr Lenore Manner next month.  Willing now to try statin for HLD but only lower dose.  Has been trying to follow lower chol diet.  BP has been lower recently Past Medical History  Diagnosis Date  . Coronary atherosclerosis of unspecified type of vessel, native or graft   . Personal history of unspecified circulatory disease   . Unspecified essential hypertension   . Other and unspecified hyperlipidemia   . Obesity, unspecified   . Anxiety state, unspecified   . Depressive disorder, not elsewhere classified   . Esophageal reflux   . Personal history of colonic polyps   . Diaphragmatic hernia without mention of obstruction or gangrene   . Diverticulosis of colon (without mention of hemorrhage)   . Benign neoplasm of colon   . Osteoarthrosis, unspecified whether generalized or localized, unspecified site   . Hiatal hernia   . Arthritis   . Esophageal stricture   . Parkinson's disease ([place])   . Mitral regurgitation    Past Surgical History  Procedure Laterality Date  . Lung biopsy    . Inguinal hernia repair      bilateral  . Ptca    . Coronary artery bypass graft      x 2  . Colonoscopy    . Polypectomy      reports that he quit smoking about  43 years ago. He has never used smokeless tobacco. He reports that he does not drink alcohol or use illicit drugs. family history includes Colon cancer in his sister. Allergies  Allergen Reactions  . Atorvastatin Other (See Comments)    Muscle cramps  . Simvastatin Other (See Comments)    Muscle cramps   Current Outpatient Prescriptions on File Prior to Visit  Medication Sig Dispense Refill  . aspirin 81 MG tablet Take 81 mg by mouth daily.      . B Complex Vitamins (VITAMIN B COMPLEX PO) Take 1 tablet by mouth daily.      . carbidopa-levodopa (SINEMET IR) 25-100 MG tablet TAKE 1 TABLET THREE TIMES DAILY 270 tablet 1  . clopidogrel (PLAVIX) 75 MG tablet TAKE 1 TABLET EVERY DAY WITH BREAKFAST 90 tablet 3  . esomeprazole (NEXIUM) 40 MG capsule TAKE 1 CAPSULE EVERY DAY 90 capsule 3  . NITROSTAT 0.4 MG SL tablet PLACE 1 TABLET (0.4 MG TOTAL) UNDER THE TONGUE EVERY 5 (FIVE) MINUTES AS NEEDED. 25 tablet 5  . Omega-3 Fatty Acids (FISH OIL) 1000 MG CAPS Take 1-2 capsules by mouth daily.      . traMADol (ULTRAM) 50 MG tablet TAKE 1 TABLET EVERY 6 HOURS AS NEEDED FOR PAIN 120 tablet 3  .  vitamin E 400 UNIT capsule Take 400 Units by mouth daily.       No current facility-administered medications on file prior to visit.   Review of Systems  Constitutional: Negative for unusual diaphoresis or night sweats HENT: Negative for ear swelling or discharge Eyes: Negative for worsening visual haziness  Respiratory: Negative for choking and stridor.   Gastrointestinal: Negative for distension or worsening eructation Genitourinary: Negative for retention or change in urine volume.  Musculoskeletal: Negative for other MSK pain or swelling Skin: Negative for color change and worsening wound Neurological: Negative for tremors and numbness other than noted  Psychiatric/Behavioral: Negative for decreased concentration or agitation other than above       Objective:   Physical Exam BP 118/72 mmHg  Pulse 53   Resp 20  Wt 150 lb (68.04 kg)  SpO2 97% VS noted, not ill but more frail appearing Constitutional: Pt appears in no apparent distress HENT: Head: NCAT.  Right Ear: External ear normal.  Left Ear: External ear normal.  Eyes: . Pupils are equal, round, and reactive to light. Conjunctivae and EOM are normal Neck: Normal range of motion. Neck supple.  Cardiovascular: Normal rate and regular rhythm.   Pulmonary/Chest: Effort normal and breath sounds without rales or wheezing.  Abd:  Soft, NT, ND, + BS Spine: mild lowest midline tender and left upper buttock /left parasternal tender Neurological: Pt is alert. Not confused , motor grossly intact except for LLE 4+/5 diffuse Skin: Skin is warm. No rash, no LE edema Psychiatric: Pt behavior is normal. No agitation.   CLINICAL DATA: Chronic hip pain. No known injury. Initial evaluation .  EXAM: DG HIP W/ PELVIS 3-4V BILAT  COMPARISON: No prior.  FINDINGS: Degenerative changes both hips. No acute bony or joint abnormality identified. No evidence of fracture or dislocation. Aortoiliac atherosclerotic vascular disease. Pelvic phleboliths.  IMPRESSION: 1. Degenerative changes both hips. No acute abnormality. No evidence of fracture dislocation.  2. Peripheral vascular disease.   Electronically Signed  By: Marcello Moores Register  On: 01/15/2014 17:10    Assessment & Plan:

## 2015-06-26 NOTE — Assessment & Plan Note (Signed)
BP lower , hx of PD, ok for trial off lisinopril, f/u BP

## 2015-06-26 NOTE — Assessment & Plan Note (Signed)
Radiculopathy vs facet syndrome vs other, for MRI LS spine, ortho referral

## 2015-06-26 NOTE — Assessment & Plan Note (Signed)
Uncontrolled, for lovastatin 20 qd,  to f/u any worsening symptoms or concerns

## 2015-06-30 ENCOUNTER — Ambulatory Visit
Admission: RE | Admit: 2015-06-30 | Discharge: 2015-06-30 | Disposition: A | Discharge status: Home / Self Care | Payer: Commercial Managed Care - HMO | Source: Ambulatory Visit | Attending: Internal Medicine | Admitting: Internal Medicine

## 2015-06-30 ENCOUNTER — Encounter: Payer: Self-pay | Admitting: Neurology

## 2015-06-30 ENCOUNTER — Ambulatory Visit (INDEPENDENT_AMBULATORY_CARE_PROVIDER_SITE_OTHER): Payer: Commercial Managed Care - HMO | Admitting: Neurology

## 2015-06-30 VITALS — BP 100/60 | HR 62 | Ht 64.0 in | Wt 149.0 lb

## 2015-06-30 DIAGNOSIS — G20 Parkinson's disease: Secondary | ICD-10-CM

## 2015-06-30 DIAGNOSIS — M5416 Radiculopathy, lumbar region: Secondary | ICD-10-CM | POA: Diagnosis not present

## 2015-06-30 DIAGNOSIS — M4806 Spinal stenosis, lumbar region: Secondary | ICD-10-CM | POA: Diagnosis not present

## 2015-06-30 NOTE — Progress Notes (Signed)
Brandon Rojas was seen today in the movement disorders clinic for neurologic consultation at the request of [name], MD.  The consultation is for the evaluation of slow movement and to r/o PD.  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    08/19/14 update: The patient is following up today.  Last visit, the patient had signs of early Parkinson's disease, but he did not yet meet official criteria for Parkinson's disease.  He is not on any medications.  I reviewed his other records since last visit.  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  Pt states that it is still flaring intermittently.  When he isn't "hurting" he moves well.  No falls.  No tremor.  No dizziness.  Doing some walking on the treadmill but it "flares up" his piriformis syndrome.    11/23/14 update:  The patient follows up today regarding his newly diagnosed Parkinson's disease.  Last visit, I started him on carbidopa/levodopa 25/100, one tablet 3 times per day.  States that "I am doing great."  States that he is walking better.  No falls.   I also talked to him about his lisinopril.  He does have history of hypertension but he is having near syncopal episodes.  He did see cardiology last visit and I reviewed those records.  His lisinopril dosage was decreased on 09/17/2014.  No lightheadedness.  Has rare nausea and not sure if associated with the medication.  He has no wearing off of the carbidopa/levodopa 25/100.  He takes it at 8am/12pm/5pm.    03/22/15 update:  Pt f/u today for PD.    The records that were made available to me were reviewed since last visit.  Pt is on carbidopa/levodopa 25/100 tid.  He gets a little nauseated with the first pill but if he takes it with [place] it helps.  He doesn't think that it changes the efficacy of the medication at all.    Wearing off:  No.  How long before next dose:  n/a Falls:   No. Hallucinations:  No.  visual distortions: No. Lightheaded:   No.  Syncope: No. Dyskinesia:  No.  Exercise:  Some, bikes some at home but knows that if he gets to the gym he feels better and walks better.   06/30/15 update:  The patient follows up today regarding his diagnosed Parkinson's disease.    He is on carbidopa/levodopa 25/100 tid.  No falls since last visit.  No hallucinations, lightheadedness or near syncope.  Biggest issue is back pain that radiates that radiates down the L leg.  Not been exercising due to the pain.  Had MRI lumbar spine this AM.    Neuroimaging has previously been performed.  It was last done when he had a "TIA" in 2006.   PREVIOUS MEDICATIONS: none to date  ALLERGIES:   Allergies  Allergen Reactions  . Atorvastatin Other (See Comments)    Muscle cramps  . Simvastatin Other (See Comments)    Muscle cramps    CURRENT MEDICATIONS:  Outpatient Encounter Prescriptions as of 06/30/2015  Medication Sig  . aspirin 81 MG tablet Take 81 mg by mouth daily.    . B Complex Vitamins (VITAMIN B COMPLEX PO) Take 1 tablet by mouth daily.    . carbidopa-levodopa (SINEMET IR) 25-100 MG tablet TAKE 1 TABLET THREE TIMES DAILY  . clopidogrel (PLAVIX) 75 MG tablet TAKE 1 TABLET EVERY DAY WITH BREAKFAST  .  esomeprazole (NEXIUM) 40 MG capsule TAKE 1 CAPSULE EVERY DAY  . lovastatin (MEVACOR) 20 MG tablet Take 1 tablet (20 mg total) by mouth at bedtime.  [name] NITROSTAT 0.4 MG SL tablet PLACE 1 TABLET (0.4 MG TOTAL) UNDER THE TONGUE EVERY 5 (FIVE) MINUTES AS NEEDED.  [name] Omega-3 Fatty Acids (FISH OIL) 1000 MG CAPS Take 1-2 capsules by mouth daily.    . traMADol (ULTRAM) 50 MG tablet TAKE 1 TABLET EVERY 6 HOURS AS NEEDED FOR PAIN  . vitamin E 400 UNIT capsule Take 400 Units by mouth daily.     No facility-administered encounter medications on file as of 06/30/2015.    PAST MEDICAL HISTORY:   Past Medical History  Diagnosis Date  . Coronary atherosclerosis of unspecified type of vessel, native or graft   . Personal history of unspecified  circulatory disease   . Unspecified essential hypertension   . Other and unspecified hyperlipidemia   . Obesity, unspecified   . Anxiety state, unspecified   . Depressive disorder, not elsewhere classified   . Esophageal reflux   . Personal history of colonic polyps   . Diaphragmatic hernia without mention of obstruction or gangrene   . Diverticulosis of colon (without mention of hemorrhage)   . Benign neoplasm of colon   . Osteoarthrosis, unspecified whether generalized or localized, unspecified site   . Hiatal hernia   . Arthritis   . Esophageal stricture   . Parkinson's disease ([place])   . Mitral regurgitation     PAST SURGICAL HISTORY:   Past Surgical History  Procedure Laterality Date  . Lung biopsy    . Inguinal hernia repair      bilateral  . Ptca    . Coronary artery bypass graft      x 2  . Colonoscopy    . Polypectomy      SOCIAL HISTORY:   Social History   Social History  . Marital Status: Married    Spouse Name: N/A  . Number of Children: 3  . Years of Education: N/A   Occupational History  . PASTOR    Social History Main Topics  . Smoking status: Former Smoker    Quit date: 02/19/1972  . Smokeless tobacco: Never Used  . Alcohol Use: No  . Drug Use: No  . Sexual Activity: Not on file   Other Topics Concern  . Not on file   Social History Narrative    FAMILY HISTORY:   Family Status  Relation Status Death Age  . Mother Deceased     MI  . Father Deceased     staph infection, stroke  . Sister Alive     colon cancer  . Sister Alive     healthy  . Sister Alive     healthy  . Brother Alive     Heart disease  . Brother Alive     healthy  . Brother Alive     healthy  . Brother Alive     healthy  . Daughter Alive     healthy  . Daughter Alive     fibromyalgia  . Son Alive     MS    ROS:  A complete 10 system review of systems was obtained and was unremarkable apart from what is mentioned above.  PHYSICAL EXAMINATION:     VITALS:   Filed Vitals:   06/30/15 1018  BP: 100/60  Pulse: 62  Height: 5\' 4"  (1.626 m)  Weight: 149 lb (67.586 kg)  Wt Readings from Last 3 Encounters:  06/30/15 149 lb (67.586 kg)  06/23/15 150 lb (68.04 kg)  03/22/15 150 lb (68.04 kg)     GEN:  The patient appears stated age and is in NAD. HEENT:  Normocephalic, atraumatic.  The mucous membranes are moist. The superficial temporal arteries are without ropiness or tenderness. CV:  RRR with 2/6 SEM Lungs:  CTAB Neck/HEME:  There are no carotid bruits bilaterally.  Neurological examination:  Orientation: The patient is alert and oriented x3.  Cranial nerves: There is good facial symmetry. There is facial hyopomimia.   The speech is fluent and clear but he is quite hypophonic. Soft palate rises symmetrically and there is no tongue deviation. Hearing is intact to conversational tone. Sensation: Sensation is intact to light touch throughout. Motor: Strength is 5/5 in the bilateral upper and lower extremities.   Shoulder shrug is equal and symmetric.  There is no pronator drift.   Movement examination: Tone: There is no tremor.   Abnormal movements: No tremor, minimal dyskinesia in the RLE Coordination:  There is trouble with alternation of supination/pronation of the forearm bilaterally, hand opening and closing on the left and toe taps on the left Gait and Station: The patient has mild difficulty arising out of a deep-seated chair without the use of the hands (mostly because of back pain). The patient's stride length is normal and arm swing is good today   ASSESSMENT/PLAN:  1.  Idiopathic Parkinson's disease, newly diagnosed August 19, 2014  -Feels much better on carbidopa/levodopa 25/100 tid and looks good.  -Talked about the importance of safe, cardiovascular exercise.  Talked again about regular exercise 2.  History of hypertension  -off lisinopril and doing well 3.  Low back pain with lumbar radiculopathy  -had MRI  lumbar spine and reviewed that with him today.  Appears that PCP has already sent him mychart message in this regard and pt f/u with ortho 4.  Follow up is anticipated in the next few months, sooner should new neurologic issues arise.  Much greater than 50% of this visit was spent in counseling with the patient.  Total face to face time:  25 min

## 2015-07-04 DIAGNOSIS — M5416 Radiculopathy, lumbar region: Secondary | ICD-10-CM | POA: Diagnosis not present

## 2015-07-16 ENCOUNTER — Other Ambulatory Visit: Payer: Self-pay | Admitting: Neurology

## 2015-07-16 ENCOUNTER — Other Ambulatory Visit: Payer: Self-pay | Admitting: Internal Medicine

## 2015-07-18 NOTE — Telephone Encounter (Signed)
Carbidopa Levodopa refill requested. Per last office note- patient to remain on medication. Refill approved and sent to patient's pharmacy.   

## 2015-07-19 NOTE — Telephone Encounter (Signed)
Please advise 

## 2015-07-19 NOTE — Telephone Encounter (Signed)
Done hardcopy to Corinne  

## 2015-07-19 NOTE — Telephone Encounter (Signed)
Refill sent to pharmacy.   

## 2015-07-26 ENCOUNTER — Ambulatory Visit: Payer: Commercial Managed Care - HMO | Admitting: Internal Medicine

## 2015-08-19 ENCOUNTER — Other Ambulatory Visit: Payer: Self-pay | Admitting: Internal Medicine

## 2015-08-19 NOTE — Telephone Encounter (Signed)
Done hardcopy to Corinne  

## 2015-08-19 NOTE — Telephone Encounter (Signed)
Medication faxed to pharmacy 

## 2015-09-02 ENCOUNTER — Other Ambulatory Visit: Payer: Self-pay | Admitting: Internal Medicine

## 2015-09-02 NOTE — Telephone Encounter (Signed)
Tramadol too soon as 4 mo rx was just done about mid august, unless pt somehow did not gain access to this or rx was lost

## 2015-10-05 NOTE — Progress Notes (Signed)
Brandon Rojas was seen today in the movement disorders clinic for neurologic consultation at the request of [name], MD.  The consultation is for the evaluation of slow movement and to r/o PD.  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    08/19/14 update: The patient is following up today.  Last visit, the patient had signs of early Parkinson's disease, but he did not yet meet official criteria for Parkinson's disease.  He is not on any medications.  I reviewed his other records since last visit.  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  Pt states that it is still flaring intermittently.  When he isn't "hurting" he moves well.  No falls.  No tremor.  No dizziness.  Doing some walking on the treadmill but it "flares up" his piriformis syndrome.    11/23/14 update:  The patient follows up today regarding his newly diagnosed Parkinson's disease.  Last visit, I started him on carbidopa/levodopa 25/100, one tablet 3 times per day.  States that "I am doing great."  States that he is walking better.  No falls.   I also talked to him about his lisinopril.  He does have history of hypertension but he is having near syncopal episodes.  He did see cardiology last visit and I reviewed those records.  His lisinopril dosage was decreased on 09/17/2014.  No lightheadedness.  Has rare nausea and not sure if associated with the medication.  He has no wearing off of the carbidopa/levodopa 25/100.  He takes it at 8am/12pm/5pm.    03/22/15 update:  Pt f/u today for PD.    The records that were made available to me were reviewed since last visit.  Pt is on carbidopa/levodopa 25/100 tid.  He gets a little nauseated with the first pill but if he takes it with [place] it helps.  He doesn't think that it changes the efficacy of the medication at all.    Wearing off:  No.  How long before next dose:  n/a Falls:   No. Hallucinations:  No.  visual distortions: No. Lightheaded:   No.  Syncope: No. Dyskinesia:  No.  Exercise:  Some, bikes some at home but knows that if he gets to the gym he feels better and walks better.   06/30/15 update:  The patient follows up today regarding his diagnosed Parkinson's disease.    He is on carbidopa/levodopa 25/100 tid.  No falls since last visit.  No hallucinations, lightheadedness or near syncope.  Biggest issue is back pain that radiates that radiates down the L leg.  Not been exercising due to the pain.  Had MRI lumbar spine this AM.    10/06/15 update:  The patient follows up today.  He remains on carbidopa/levodopa 25/100, one tablet 3 times per day.  He has had no falls.  No lightheadedness or near syncope.  No hallucinations.  Sleeping well.  Last visit, his biggest complaints were back pain and lumbar radiculopathy.  He was referred to ortho by his primary care physician and the patient states that he went to Dr. Oneita Kras and he was told that surgery wouldn't be indicated.  He was placed on prednisone without relief.  He was offered epidurals and didn't want that.  3 weeks ago, he started going to chiropractor in [place] and he thinks that it was helpful.  He can finally cross the leg without burning/shooting pain down the leg.  Hasn't  been able to get back to vigorous CV exercise yet.  He is walking for exercise.  Neuroimaging has previously been performed.  It was last done when he had a "TIA" in 2006.   PREVIOUS MEDICATIONS: none to date  ALLERGIES:   Allergies  Allergen Reactions  . Atorvastatin Other (See Comments)    Muscle cramps  . Simvastatin Other (See Comments)    Muscle cramps    CURRENT MEDICATIONS:  Outpatient Encounter Prescriptions as of 10/06/2015  Medication Sig  . aspirin 81 MG tablet Take 81 mg by mouth daily.    . B Complex Vitamins (VITAMIN B COMPLEX PO) Take 1 tablet by mouth daily.    . carbidopa-levodopa (SINEMET IR) 25-100 MG tablet TAKE 1 TABLET THREE TIMES DAILY  . clopidogrel (PLAVIX) 75 MG  tablet TAKE 1 TABLET EVERY DAY WITH BREAKFAST  . esomeprazole (NEXIUM) 40 MG capsule TAKE 1 CAPSULE EVERY DAY  . lovastatin (MEVACOR) 20 MG tablet Take 1 tablet (20 mg total) by mouth at bedtime.  . Omega-3 Fatty Acids (FISH OIL) 1000 MG CAPS Take 1-2 capsules by mouth daily.    . traMADol (ULTRAM) 50 MG tablet TAKE 1 TABLET EVERY 6 HOURS AS NEEDED FOR PAIN  . vitamin E 400 UNIT capsule Take 400 Units by mouth daily.    [name] NITROSTAT 0.4 MG SL tablet PLACE 1 TABLET (0.4 MG TOTAL) UNDER THE TONGUE EVERY 5 (FIVE) MINUTES AS NEEDED. (Patient not taking: Reported on 10/06/2015)   No facility-administered encounter medications on file as of 10/06/2015.     PAST MEDICAL HISTORY:   Past Medical History:  Diagnosis Date  . Anxiety state, unspecified   . Arthritis   . Benign neoplasm of colon   . Coronary atherosclerosis of unspecified type of vessel, native or graft   . Depressive disorder, not elsewhere classified   . Diaphragmatic hernia without mention of obstruction or gangrene   . Diverticulosis of colon (without mention of hemorrhage)   . Esophageal reflux   . Esophageal stricture   . Hiatal hernia   . Mitral regurgitation   . Obesity, unspecified   . Osteoarthrosis, unspecified whether generalized or localized, unspecified site   . Other and unspecified hyperlipidemia   . Parkinson's disease ([place])   . Personal history of colonic polyps   . Personal history of unspecified circulatory disease   . Unspecified essential hypertension     PAST SURGICAL HISTORY:   Past Surgical History:  Procedure Laterality Date  . COLONOSCOPY    . CORONARY ARTERY BYPASS GRAFT     x 2  . INGUINAL HERNIA REPAIR     bilateral  . LUNG BIOPSY    . POLYPECTOMY    . PTCA      SOCIAL HISTORY:   Social History   Social History  . Marital status: Married    Spouse name: N/A  . Number of children: 3  . Years of education: N/A   Occupational History  . PASTOR [employer]   Social  History Main Topics  . Smoking status: Former Smoker    Quit date: 02/19/1972  . Smokeless tobacco: Never Used  . Alcohol use No  . Drug use: No  . Sexual activity: Not on file   Other Topics Concern  . Not on file   Social History Narrative  . No narrative on file    FAMILY HISTORY:   Family Status  Relation Status  . Mother Deceased   MI  . Father  Deceased   staph infection, stroke  . Sister Alive   colon cancer  . Sister Alive   healthy  . Sister Alive   healthy  . Brother Alive   Heart disease  . Brother Alive   healthy  . Brother Alive   healthy  . Brother Alive   healthy  . Daughter Alive   healthy  . Daughter Alive   fibromyalgia  . Son Alive   MS  . Sister     ROS:  A complete 10 system review of systems was obtained and was unremarkable apart from what is mentioned above.  PHYSICAL EXAMINATION:    VITALS:   Vitals:   10/06/15 1016  BP: 130/70  Pulse: (!) 58  Weight: 147 lb (66.7 kg)  Height: 5\' 4"  (1.626 m)   Wt Readings from Last 3 Encounters:  10/06/15 147 lb (66.7 kg)  06/30/15 149 lb (67.6 kg)  06/23/15 150 lb (68 kg)     GEN:  The patient appears stated age and is in NAD. HEENT:  Normocephalic, atraumatic.  The mucous membranes are moist. The superficial temporal arteries are without ropiness or tenderness. CV:  Bradycardic with 2/6 SEM Lungs:  CTAB Neck/HEME:  There are no carotid bruits bilaterally.  Neurological examination:  Orientation: The patient is alert and oriented x3.  Cranial nerves: There is good facial symmetry. There is facial hyopomimia.   The speech is fluent and clear but he is quite hypophonic. Soft palate rises symmetrically and there is no tongue deviation. Hearing is intact to conversational tone. Sensation: Sensation is intact to light touch throughout. Motor: Strength is 5/5 in the bilateral upper and lower extremities.   Shoulder shrug is equal and symmetric.  There is no pronator drift.   Movement  examination: Tone: There is no tremor.   Abnormal movements: No tremor, minimal dyskinesia in the RLE Coordination:  There is trouble with alternation of supination/pronation of the forearm bilaterally, hand opening and closing on the left and toe taps on the left Gait and Station: The patient gets OOC with much more ease than last visit. The patient's stride length is normal and arm swing is good today   ASSESSMENT/PLAN:  1.  Idiopathic Parkinson's disease, newly diagnosed August 19, 2014  -Feels much better on carbidopa/levodopa 25/100 tid (8/15am/12:15pm/5:15pm)  and looks good.  -Talked about the importance of safe, cardiovascular exercise.  Talked again about regular exercise.  Info given on [employer] in [place]. 2.  History of hypertension  -off lisinopril and doing well 3.  Low back pain with lumbar radiculopathy  -was following with orthopedics but told no surgery indicated.  Pt thinks receiving benefit from chiropractors. 4.  Follow up is anticipated in the next few months, sooner should new neurologic issues arise.  Much greater than 50% of this visit was spent in counseling with the patient.  Total face to face time:  25 min

## 2015-10-06 ENCOUNTER — Ambulatory Visit (INDEPENDENT_AMBULATORY_CARE_PROVIDER_SITE_OTHER): Payer: Commercial Managed Care - HMO | Admitting: Neurology

## 2015-10-06 ENCOUNTER — Encounter: Payer: Self-pay | Admitting: Neurology

## 2015-10-06 VITALS — BP 130/70 | HR 58 | Ht 64.0 in | Wt 147.0 lb

## 2015-10-06 DIAGNOSIS — G20 Parkinson's disease: Secondary | ICD-10-CM

## 2015-10-06 DIAGNOSIS — M5416 Radiculopathy, lumbar region: Secondary | ICD-10-CM

## 2015-10-06 NOTE — Patient Instructions (Addendum)
1.  Fax me notes from your chiropractor at [phone] 2.  Look at the information on [employer] 3.  Good to see you!  See you in 3-4 months

## 2015-10-17 ENCOUNTER — Other Ambulatory Visit: Payer: Self-pay | Admitting: Internal Medicine

## 2015-12-13 ENCOUNTER — Ambulatory Visit (INDEPENDENT_AMBULATORY_CARE_PROVIDER_SITE_OTHER): Payer: Commercial Managed Care - HMO | Admitting: Internal Medicine

## 2015-12-13 ENCOUNTER — Encounter: Payer: Self-pay | Admitting: Internal Medicine

## 2015-12-13 VITALS — BP 130/70 | HR 60 | Temp 98.2°F | Resp 20 | Wt 149.0 lb

## 2015-12-13 DIAGNOSIS — E785 Hyperlipidemia, unspecified: Secondary | ICD-10-CM | POA: Diagnosis not present

## 2015-12-13 DIAGNOSIS — I10 Essential (primary) hypertension: Secondary | ICD-10-CM

## 2015-12-13 DIAGNOSIS — I340 Nonrheumatic mitral (valve) insufficiency: Secondary | ICD-10-CM | POA: Diagnosis not present

## 2015-12-13 DIAGNOSIS — Z0001 Encounter for general adult medical examination with abnormal findings: Secondary | ICD-10-CM

## 2015-12-13 MED ORDER — TRAMADOL HCL 50 MG PO TABS: 50.0000 mg | ORAL_TABLET | Freq: Four times a day (QID) | ORAL | 5 refills | Status: DC | PRN

## 2015-12-13 NOTE — Progress Notes (Signed)
Pre visit review using our clinic review tool, if applicable. No additional management support is needed unless otherwise documented below in the visit note. 

## 2015-12-13 NOTE — Patient Instructions (Signed)
Please continue all other medications as before, and refills have been done if requested.  Please have the pharmacy call with any other refills you may need.  Please continue your efforts at being more active, low cholesterol diet, and weight control.  You are otherwise up to date with prevention measures today.  Please keep your appointments with your specialists as you may have planned  Please return in Jan 2018 or sooner if needed, with Lab testing done 3-5 days before

## 2015-12-13 NOTE — Progress Notes (Signed)
Subjective:    Patient ID: Brandon Rojas, male    DOB: [birth year], 70 y.o.   MRN: [id number]  HPI  Here to f/u; overall doing ok,  Pt denies chest pain, increasing sob or doe, wheezing, orthopnea, PND, increased LE swelling, palpitations, dizziness or syncope.  Pt denies new neurological symptoms such as new headache, or facial or extremity weakness or numbness.  Pt denies polydipsia, polyuria, or low sugar episode.   Pt denies new neurological symptoms such as new headache, or facial or extremity weakness or numbness.   Pt states overall good compliance with meds, mostly trying to follow appropriate diet.  Has been off lisionpril for several months due to lower BP. BP Readings from Last 3 Encounters:  12/13/15 130/70  10/06/15 130/70  06/30/15 100/60  Has seen Dr Opal Sidles for lower back and offered ESI for lumbar DDD/DJD but pt deferred as would have to come off the plavix, and did not want to do this.  Saw chiropracter and overall much improved per pt, plans to get back to the gym soon.  No recent falls. Sees neuorDr Tat about q 60mo, next due jan 2018.  No memory change or concerns per pt. Appetite somewhat less recenlty, thinks related to the sinamet, about 20 lbs per pt overall, but stable in last 6 mo.  Has been able to cut the lovastastin from 40 to 20. Wt Readings from Last 3 Encounters:  12/13/15 149 lb (67.6 kg)  10/06/15 147 lb (66.7 kg)  06/30/15 149 lb (67.6 kg)   Past Medical History:  Diagnosis Date  . Anxiety state, unspecified   . Arthritis   . Benign neoplasm of colon   . Coronary atherosclerosis of unspecified type of vessel, native or graft   . Depressive disorder, not elsewhere classified   . Diaphragmatic hernia without mention of obstruction or gangrene   . Diverticulosis of colon (without mention of hemorrhage)   . Esophageal reflux   . Esophageal stricture   . Hiatal hernia   . Mitral regurgitation   . Obesity, unspecified   . Osteoarthrosis, unspecified  whether generalized or localized, unspecified site   . Other and unspecified hyperlipidemia   . Parkinson's disease ([place])   . Personal history of colonic polyps   . Personal history of unspecified circulatory disease   . Unspecified essential hypertension    Past Surgical History:  Procedure Laterality Date  . COLONOSCOPY    . CORONARY ARTERY BYPASS GRAFT     x 2  . INGUINAL HERNIA REPAIR     bilateral  . LUNG BIOPSY    . POLYPECTOMY    . PTCA      reports that he quit smoking about 43 years ago. He has never used smokeless tobacco. He reports that he does not drink alcohol or use drugs. family history includes Colon cancer in his sister. Allergies  Allergen Reactions  . Atorvastatin Other (See Comments)    Muscle cramps  . Simvastatin Other (See Comments)    Muscle cramps   Current Outpatient Prescriptions on File Prior to Visit  Medication Sig Dispense Refill  . aspirin 81 MG tablet Take 81 mg by mouth daily.      . B Complex Vitamins (VITAMIN B COMPLEX PO) Take 1 tablet by mouth daily.      . carbidopa-levodopa (SINEMET IR) 25-100 MG tablet TAKE 1 TABLET THREE TIMES DAILY 270 tablet 1  . clopidogrel (PLAVIX) 75 MG tablet TAKE 1 TABLET EVERY DAY WITH BREAKFAST  90 tablet 3  . esomeprazole (NEXIUM) 40 MG capsule TAKE 1 CAPSULE EVERY DAY 90 capsule 3  . lovastatin (MEVACOR) 20 MG tablet Take 1 tablet (20 mg total) by mouth at bedtime. 90 tablet 3  . NITROSTAT 0.4 MG SL tablet PLACE 1 TABLET (0.4 MG TOTAL) UNDER THE TONGUE EVERY 5 (FIVE) MINUTES AS NEEDED. 25 tablet 5  . Omega-3 Fatty Acids (FISH OIL) 1000 MG CAPS Take 1-2 capsules by mouth daily.      . vitamin E 400 UNIT capsule Take 400 Units by mouth daily.       No current facility-administered medications on file prior to visit.    Review of Systems  Constitutional: Negative for unusual diaphoresis or night sweats HENT: Negative for ear swelling or discharge Eyes: Negative for worsening visual haziness  Respiratory:  Negative for choking and stridor.   Gastrointestinal: Negative for distension or worsening eructation Genitourinary: Negative for retention or change in urine volume.  Musculoskeletal: Negative for other MSK pain or swelling Skin: Negative for color change and worsening wound Neurological: Negative for tremors and numbness other than noted  Psychiatric/Behavioral: Negative for decreased concentration or agitation other than above   All other system neg per pt    Objective:   Physical Exam BP 130/70   Pulse 60   Temp 98.2 F (36.8 C) (Oral)   Resp 20   Wt 149 lb (67.6 kg)   SpO2 96%   BMI 25.58 kg/m  VS noted,  Constitutional: Pt appears in no apparent distress HENT: Head: NCAT.  Right Ear: External ear normal.  Left Ear: External ear normal.  Eyes: . Pupils are equal, round, and reactive to light. Conjunctivae and EOM are normal Neck: Normal range of motion. Neck supple.  Cardiovascular: Normal rate and regular rhythm.   Pulmonary/Chest: Effort normal and breath sounds without rales or wheezing.  Abd:  Soft, NT, ND, + BS Neurological: Pt is alert. Not confused , motor grossly intact Skin: Skin is warm. No rash, no LE edema Psychiatric: Pt behavior is normal. No agitation.  No other new exam findings    Assessment & Plan:

## 2015-12-20 NOTE — Assessment & Plan Note (Signed)
stable overall by history and exam, recent data reviewed with pt, and pt to continue medical treatment as before,  to f/u any worsening symptoms or concerns Lab Results  Component Value Date   LDLCALC 57 01/25/2015

## 2015-12-20 NOTE — Assessment & Plan Note (Signed)
Moderate, stable volume, asympt,  to f/u any worsening symptoms or concerns

## 2015-12-20 NOTE — Assessment & Plan Note (Signed)
stable overall by history and exam, recent data reviewed with pt, and pt to continue medical treatment as before,  to f/u any worsening symptoms or concerns BP Readings from Last 3 Encounters:  12/13/15 130/70  10/06/15 130/70  06/30/15 100/60

## 2016-01-07 ENCOUNTER — Other Ambulatory Visit: Payer: Self-pay | Admitting: Neurology

## 2016-01-07 ENCOUNTER — Other Ambulatory Visit: Payer: Self-pay | Admitting: Internal Medicine

## 2016-01-16 ENCOUNTER — Telehealth: Payer: Self-pay | Admitting: Geriatric Medicine

## 2016-01-16 NOTE — Telephone Encounter (Signed)
Please call patient's wife about a medication question.

## 2016-01-17 ENCOUNTER — Other Ambulatory Visit (INDEPENDENT_AMBULATORY_CARE_PROVIDER_SITE_OTHER): Payer: Commercial Managed Care - HMO

## 2016-01-17 DIAGNOSIS — Z0000 Encounter for general adult medical examination without abnormal findings: Secondary | ICD-10-CM

## 2016-01-17 LAB — URINALYSIS, ROUTINE W REFLEX MICROSCOPIC
Bilirubin Urine: NEGATIVE
HGB URINE DIPSTICK: NEGATIVE
Ketones, ur: NEGATIVE
LEUKOCYTES UA: NEGATIVE
NITRITE: NEGATIVE
RBC / HPF: NONE SEEN (ref 0–?)
Specific Gravity, Urine: 1.01 (ref 1.000–1.030)
TOTAL PROTEIN, URINE-UPE24: NEGATIVE
URINE GLUCOSE: NEGATIVE
UROBILINOGEN UA: 0.2 (ref 0.0–1.0)
WBC, UA: NONE SEEN (ref 0–?)
pH: 7.5 (ref 5.0–8.0)

## 2016-01-17 LAB — CBC WITH DIFFERENTIAL/PLATELET
BASOS ABS: 0.1 10*3/uL (ref 0.0–0.1)
Basophils Relative: 0.9 % (ref 0.0–3.0)
Eosinophils Absolute: 0.6 10*3/uL (ref 0.0–0.7)
Eosinophils Relative: 9.1 % — ABNORMAL HIGH (ref 0.0–5.0)
HCT: 43 % (ref 39.0–52.0)
HEMOGLOBIN: 14.5 g/dL (ref 13.0–17.0)
Lymphocytes Relative: 23.6 % (ref 12.0–46.0)
Lymphs Abs: 1.4 10*3/uL (ref 0.7–4.0)
MCHC: 33.7 g/dL (ref 30.0–36.0)
MCV: 88.7 fl (ref 78.0–100.0)
MONO ABS: 0.6 10*3/uL (ref 0.1–1.0)
MONOS PCT: 9.8 % (ref 3.0–12.0)
NEUTROS PCT: 56.6 % (ref 43.0–77.0)
Neutro Abs: 3.5 10*3/uL (ref 1.4–7.7)
Platelets: 266 10*3/uL (ref 150.0–400.0)
RBC: 4.85 Mil/uL (ref 4.22–5.81)
RDW: 14.1 % (ref 11.5–15.5)
WBC: 6.1 10*3/uL (ref 4.0–10.5)

## 2016-01-17 LAB — HEPATIC FUNCTION PANEL
ALBUMIN: 3.8 g/dL (ref 3.5–5.2)
ALT: 15 U/L (ref 0–53)
AST: 16 U/L (ref 0–37)
Alkaline Phosphatase: 68 U/L (ref 39–117)
Bilirubin, Direct: 0.1 mg/dL (ref 0.0–0.3)
TOTAL PROTEIN: 6.5 g/dL (ref 6.0–8.3)
Total Bilirubin: 0.5 mg/dL (ref 0.2–1.2)

## 2016-01-17 LAB — LIPID PANEL
CHOLESTEROL: 157 mg/dL (ref 0–200)
HDL: 60.6 mg/dL (ref >39.00)
LDL CALC: 77 mg/dL (ref 0–99)
NonHDL: 96.65
TRIGLYCERIDES: 98 mg/dL (ref 0.0–149.0)
Total CHOL/HDL Ratio: 3
VLDL: 19.6 mg/dL (ref 0.0–40.0)

## 2016-01-17 LAB — BASIC METABOLIC PANEL
BUN: 14 mg/dL (ref 6–23)
CALCIUM: 9.6 mg/dL (ref 8.4–10.5)
CO2: 29 meq/L (ref 19–32)
Chloride: 104 mEq/L (ref 96–112)
Creatinine, Ser: 1.08 mg/dL (ref 0.40–1.50)
GFR: 71.5 mL/min (ref >60.00)
GLUCOSE: 91 mg/dL (ref 70–99)
Potassium: 4.6 mEq/L (ref 3.5–5.1)
Sodium: 140 mEq/L (ref 135–145)

## 2016-01-17 LAB — PSA: PSA: 1.21 ng/mL (ref 0.10–4.00)

## 2016-01-17 LAB — TSH: TSH: 3.96 u[IU]/mL (ref 0.35–4.50)

## 2016-01-17 NOTE — Telephone Encounter (Signed)
Spoke to patients wife she stated that the medication Nexium is needing a prior authorization for this medication.   I will start PA and will contact patient once it is finished.

## 2016-01-18 NOTE — Progress Notes (Signed)
Brandon Rojas was seen today in the movement disorders clinic for neurologic consultation at the request of [name], MD.  The consultation is for the evaluation of slow movement and to r/o PD.  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    08/19/14 update: The patient is following up today.  Last visit, the patient had signs of early Parkinson's disease, but he did not yet meet official criteria for Parkinson's disease.  He is not on any medications.  I reviewed his other records since last visit.  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  Pt states that it is still flaring intermittently.  When he isn't "hurting" he moves well.  No falls.  No tremor.  No dizziness.  Doing some walking on the treadmill but it "flares up" his piriformis syndrome.    11/23/14 update:  The patient follows up today regarding his newly diagnosed Parkinson's disease.  Last visit, I started him on carbidopa/levodopa 25/100, one tablet 3 times per day.  States that "I am doing great."  States that he is walking better.  No falls.   I also talked to him about his lisinopril.  He does have history of hypertension but he is having near syncopal episodes.  He did see cardiology last visit and I reviewed those records.  His lisinopril dosage was decreased on 09/17/2014.  No lightheadedness.  Has rare nausea and not sure if associated with the medication.  He has no wearing off of the carbidopa/levodopa 25/100.  He takes it at 8am/12pm/5pm.    03/22/15 update:  Pt f/u today for PD.    The records that were made available to me were reviewed since last visit.  Pt is on carbidopa/levodopa 25/100 tid.  He gets a little nauseated with the first pill but if he takes it with [place] it helps.  He doesn't think that it changes the efficacy of the medication at all.    Wearing off:  No.  How long before next dose:  n/a Falls:   No. Hallucinations:  No.  visual distortions: No. Lightheaded:   No.  Syncope: No. Dyskinesia:  No.  Exercise:  Some, bikes some at home but knows that if he gets to the gym he feels better and walks better.   06/30/15 update:  The patient follows up today regarding his diagnosed Parkinson's disease.    He is on carbidopa/levodopa 25/100 tid.  No falls since last visit.  No hallucinations, lightheadedness or near syncope.  Biggest issue is back pain that radiates that radiates down the L leg.  Not been exercising due to the pain.  Had MRI lumbar spine this AM.    10/06/15 update:  The patient follows up today.  He remains on carbidopa/levodopa 25/100, one tablet 3 times per day.  He has had no falls.  No lightheadedness or near syncope.  No hallucinations.  Sleeping well.  Last visit, his biggest complaints were back pain and lumbar radiculopathy.  He was referred to ortho by his primary care physician and the patient states that he went to Dr. Oneita Kras and he was told that surgery wouldn't be indicated.  He was placed on prednisone without relief.  He was offered epidurals and didn't want that.  3 weeks ago, he started going to chiropractor in [place] and he thinks that it was helpful.  He can finally cross the leg without burning/shooting pain down the leg.  Hasn't  been able to get back to vigorous CV exercise yet.  He is walking for exercise.  01/19/16 update:  Pt f/u today for PD.  The records that were made available to me were reviewed since last visit.    Still on carbidopa/levodopa 25/100 at 8:15/12:15/5:15.  Wearing off:  No.  How long before next dose:  N/a (only noted it one time and it wasn't signficiant) Falls:   No. N/V:  No. Hallucinations:  No.  visual distortions: No. Lightheaded:  No.  Syncope: No. Dyskinesia:  No.  Neuroimaging has previously been performed.  It was last done when he had a "TIA" in 2006.   PREVIOUS MEDICATIONS: none to date  ALLERGIES:   Allergies  Allergen Reactions  . Atorvastatin Other (See Comments)    Muscle cramps   . Simvastatin Other (See Comments)    Muscle cramps    CURRENT MEDICATIONS:  Outpatient Encounter Prescriptions as of 01/19/2016  Medication Sig  . aspirin 81 MG tablet Take 81 mg by mouth daily.    . B Complex Vitamins (VITAMIN B COMPLEX PO) Take 1 tablet by mouth daily.    . carbidopa-levodopa (SINEMET IR) 25-100 MG tablet TAKE 1 TABLET THREE TIMES DAILY  . clopidogrel (PLAVIX) 75 MG tablet TAKE 1 TABLET EVERY DAY WITH BREAKFAST  . esomeprazole (NEXIUM) 40 MG capsule TAKE 1 CAPSULE EVERY DAY  . lovastatin (MEVACOR) 20 MG tablet Take 1 tablet (20 mg total) by mouth at bedtime.  . Omega-3 Fatty Acids (FISH OIL) 1000 MG CAPS Take 1-2 capsules by mouth daily.    . traMADol (ULTRAM) 50 MG tablet Take 1 tablet (50 mg total) by mouth every 6 (six) hours as needed. for pain  . vitamin E 400 UNIT capsule Take 400 Units by mouth daily.    [name] NITROSTAT 0.4 MG SL tablet PLACE 1 TABLET (0.4 MG TOTAL) UNDER THE TONGUE EVERY 5 (FIVE) MINUTES AS NEEDED. (Patient not taking: Reported on 01/19/2016)   No facility-administered encounter medications on file as of 01/19/2016.     PAST MEDICAL HISTORY:   Past Medical History:  Diagnosis Date  . Anxiety state, unspecified   . Arthritis   . Benign neoplasm of colon   . Coronary atherosclerosis of unspecified type of vessel, native or graft   . Depressive disorder, not elsewhere classified   . Diaphragmatic hernia without mention of obstruction or gangrene   . Diverticulosis of colon (without mention of hemorrhage)   . Esophageal reflux   . Esophageal stricture   . Hiatal hernia   . Mitral regurgitation   . Obesity, unspecified   . Osteoarthrosis, unspecified whether generalized or localized, unspecified site   . Other and unspecified hyperlipidemia   . Parkinson's disease ([place])   . Personal history of colonic polyps   . Personal history of unspecified circulatory disease   . Unspecified essential hypertension     PAST SURGICAL HISTORY:   Past  Surgical History:  Procedure Laterality Date  . COLONOSCOPY    . CORONARY ARTERY BYPASS GRAFT     x 2  . INGUINAL HERNIA REPAIR     bilateral  . LUNG BIOPSY    . POLYPECTOMY    . PTCA      SOCIAL HISTORY:   Social History   Social History  . Marital status: Married    Spouse name: N/A  . Number of children: 3  . Years of education: N/A   Occupational History  . [employer]  Social History Main Topics  . Smoking status: Former Smoker    Quit date: 02/19/1972  . Smokeless tobacco: Never Used  . Alcohol use No  . Drug use: No  . Sexual activity: Not on file   Other Topics Concern  . Not on file   Social History Narrative  . No narrative on file    FAMILY HISTORY:   Family Status  Relation Status  . Mother Deceased   MI  . Father Deceased   staph infection, stroke  . Sister Alive   colon cancer  . Sister Alive   healthy  . Sister Alive   healthy  . Brother Alive   Heart disease  . Brother Alive   healthy  . Brother Alive   healthy  . Brother Alive   healthy  . Daughter Alive   healthy  . Daughter Alive   fibromyalgia  . Son Alive   MS  . Sister     ROS:  A complete 10 system review of systems was obtained and was unremarkable apart from what is mentioned above.  PHYSICAL EXAMINATION:    VITALS:   Vitals:   01/19/16 1037  BP: 112/60  Pulse: (!) 56  Weight: 147 lb (66.7 kg)  Height: 5\' 4"  (1.626 m)   Wt Readings from Last 3 Encounters:  01/19/16 147 lb (66.7 kg)  01/19/16 148 lb (67.1 kg)  12/13/15 149 lb (67.6 kg)     GEN:  The patient appears stated age and is in NAD. HEENT:  Normocephalic, atraumatic.  The mucous membranes are moist. The superficial temporal arteries are without ropiness or tenderness. CV:  Bradycardic with 2/6 SEM Lungs:  CTAB Neck/HEME:  There are no carotid bruits bilaterally.  Neurological examination:  Orientation: The patient is alert and oriented x3.  Cranial nerves: There is  good facial symmetry. There is facial hyopomimia.   The speech is fluent and clear but he is quite hypophonic. Soft palate rises symmetrically and there is no tongue deviation. Hearing is intact to conversational tone. Sensation: Sensation is intact to light touch throughout. Motor: Strength is 5/5 in the bilateral upper and lower extremities.   Shoulder shrug is equal and symmetric.  There is no pronator drift.   Movement examination: Tone: There is no tremor.   Abnormal movements: No tremor, minimal dyskinesia in the RLE Coordination:  There is trouble with alternation of supination/pronation of the forearm bilaterally, hand opening and closing on the left and toe taps on the left Gait and Station: The patient gets OOC easily. The patient's stride length is normal and arm swing is good today.  Negative pull test.  ASSESSMENT/PLAN:  1.  Idiopathic Parkinson's disease, newly diagnosed August 19, 2014  -Feels much better on carbidopa/levodopa 25/100 tid (8/15am/12:15pm/5:15pm)  and looks good.  Continue on this medication  -Talked about the importance of safe, cardiovascular exercise.  Talked again about regular exercise.   2.  History of hypertension  -off lisinopril and doing well 3.  Low back pain with lumbar radiculopathy  -was following with orthopedics but told no surgery indicated.  Pt thinks receiving benefit from chiropractors. 4.  Follow up is anticipated in the next few months, sooner should new neurologic issues arise.  Much greater than 50% of this visit was spent in counseling with the patient.  Total face to face time:  25 min

## 2016-01-19 ENCOUNTER — Encounter: Payer: Self-pay | Admitting: Internal Medicine

## 2016-01-19 ENCOUNTER — Encounter: Payer: Self-pay | Admitting: Neurology

## 2016-01-19 ENCOUNTER — Ambulatory Visit (INDEPENDENT_AMBULATORY_CARE_PROVIDER_SITE_OTHER): Payer: Medicare PPO | Admitting: Neurology

## 2016-01-19 ENCOUNTER — Ambulatory Visit (INDEPENDENT_AMBULATORY_CARE_PROVIDER_SITE_OTHER): Payer: Medicare PPO | Admitting: Internal Medicine

## 2016-01-19 VITALS — BP 128/74 | HR 56 | Temp 98.5°F | Resp 20 | Wt 148.0 lb

## 2016-01-19 VITALS — BP 112/60 | HR 56 | Ht 64.0 in | Wt 147.0 lb

## 2016-01-19 DIAGNOSIS — Z23 Encounter for immunization: Secondary | ICD-10-CM

## 2016-01-19 DIAGNOSIS — Z0001 Encounter for general adult medical examination with abnormal findings: Secondary | ICD-10-CM

## 2016-01-19 DIAGNOSIS — Z0000 Encounter for general adult medical examination without abnormal findings: Secondary | ICD-10-CM | POA: Diagnosis not present

## 2016-01-19 DIAGNOSIS — G20 Parkinson's disease: Secondary | ICD-10-CM | POA: Diagnosis not present

## 2016-01-19 NOTE — Progress Notes (Signed)
Pre visit review using our clinic review tool, if applicable. No additional management support is needed unless otherwise documented below in the visit note. 

## 2016-01-19 NOTE — Progress Notes (Signed)
Subjective:    Patient ID: Brandon Rojas, male    DOB: 05-09-1945, 71 y.o.   MRN: [id number]  HPI Here for wellness and f/u;  Overall doing ok;  Pt denies Chest pain, worsening SOB, DOE, wheezing, orthopnea, PND, worsening LE edema, palpitations, dizziness or syncope.  Pt denies neurological change such as new headache, facial or extremity weakness.  Pt denies polydipsia, polyuria, or low sugar symptoms. Pt states overall good compliance with treatment and medications, good tolerability, and has been trying to follow appropriate diet.  Pt denies worsening depressive symptoms, suicidal ideation or panic. No fever, night sweats, wt loss, loss of appetite, or other constitutional symptoms.  Pt states good ability with ADL's, has low fall risk, home safety reviewed and adequate, no other significant changes in hearing or vision, and only occasionally active with exercise. Has lost some wt with the dx of PD, but recently stable,  Still working full time as [employer] manager. No memory issues worsening per pt. No other change to hx Wt Readings from Last 3 Encounters:  01/19/16 148 lb (67.1 kg)  12/13/15 149 lb (67.6 kg)  10/06/15 147 lb (66.7 kg)   Past Medical History:  Diagnosis Date  . Anxiety state, unspecified   . Arthritis   . Benign neoplasm of colon   . Coronary atherosclerosis of unspecified type of vessel, native or graft   . Depressive disorder, not elsewhere classified   . Diaphragmatic hernia without mention of obstruction or gangrene   . Diverticulosis of colon (without mention of hemorrhage)   . Esophageal reflux   . Esophageal stricture   . Hiatal hernia   . Mitral regurgitation   . Obesity, unspecified   . Osteoarthrosis, unspecified whether generalized or localized, unspecified site   . Other and unspecified hyperlipidemia   . Parkinson's disease ([place])   . Personal history of colonic polyps   . Personal history of unspecified circulatory disease   . Unspecified essential hypertension      Past Surgical History:  Procedure Laterality Date  . COLONOSCOPY    . CORONARY ARTERY BYPASS GRAFT     x 2  . INGUINAL HERNIA REPAIR     bilateral  . LUNG BIOPSY    . POLYPECTOMY    . PTCA      reports that he quit smoking about 43 years ago. He has never used smokeless tobacco. He reports that he does not drink alcohol or use drugs. family history includes Colon cancer in his sister. Allergies  Allergen Reactions  . Atorvastatin Other (See Comments)    Muscle cramps  . Simvastatin Other (See Comments)    Muscle cramps   Current Outpatient Prescriptions on File Prior to Visit  Medication Sig Dispense Refill  . aspirin 81 MG tablet Take 81 mg by mouth daily.      . B Complex Vitamins (VITAMIN B COMPLEX PO) Take 1 tablet by mouth daily.      . carbidopa-levodopa (SINEMET IR) 25-100 MG tablet TAKE 1 TABLET THREE TIMES DAILY 270 tablet 1  . clopidogrel (PLAVIX) 75 MG tablet TAKE 1 TABLET EVERY DAY WITH BREAKFAST 90 tablet 3  . esomeprazole (NEXIUM) 40 MG capsule TAKE 1 CAPSULE EVERY DAY 90 capsule 3  . lovastatin (MEVACOR) 20 MG tablet Take 1 tablet (20 mg total) by mouth at bedtime. 90 tablet 3  . NITROSTAT 0.4 MG SL tablet PLACE 1 TABLET (0.4 MG TOTAL) UNDER THE TONGUE EVERY 5 (FIVE) MINUTES AS NEEDED. 25 tablet 5  .  Omega-3 Fatty Acids (FISH OIL) 1000 MG CAPS Take 1-2 capsules by mouth daily.      . traMADol (ULTRAM) 50 MG tablet Take 1 tablet (50 mg total) by mouth every 6 (six) hours as needed. for pain 120 tablet 5  . vitamin E 400 UNIT capsule Take 400 Units by mouth daily.       No current facility-administered medications on file prior to visit.     Review of Systems Constitutional: Negative for increased diaphoresis, or other activity, appetite or siginficant weight change other than noted HENT: Negative for worsening hearing loss, ear pain, facial swelling, mouth sores and neck stiffness.   Eyes: Negative for other worsening pain, redness or visual disturbance.   Respiratory: Negative for choking or stridor Cardiovascular: Negative for other chest pain and palpitations.  Gastrointestinal: Negative for worsening diarrhea, blood in stool, or abdominal distention Genitourinary: Negative for hematuria, flank pain or change in urine volume.  Musculoskeletal: Negative for myalgias or other joint complaints.  Skin: Negative for other color change and wound or drainage.  Neurological: Negative for syncope and numbness. other than noted Hematological: Negative for adenopathy. or other swelling Psychiatric/Behavioral: Negative for hallucinations, SI, self-injury, decreased concentration or other worsening agitation.  All other system neg per pt    Objective:   Physical Exam BP 128/74   Pulse (!) 56   Temp 98.5 F (36.9 C) (Oral)   Resp 20   Wt 148 lb (67.1 kg)   SpO2 97%   BMI 25.40 kg/m  VS noted, not ill appearing Constitutional: Pt is oriented to person, place, and time. Appears well-developed and well-nourished, in no significant distress Head: Normocephalic and atraumatic  Eyes: Conjunctivae and EOM are normal. Pupils are equal, round, and reactive to light Right Ear: External ear normal.  Left Ear: External ear normal Nose: Nose normal.  Mouth/Throat: Oropharynx is clear and moist  Neck: Normal range of motion. Neck supple. No JVD present. No tracheal deviation present or significant neck LA or mass Cardiovascular: Normal rate, regular rhythm, normal heart sounds and intact distal pulses.   Pulmonary/Chest: Effort normal and breath sounds without rales or wheezing  Abdominal: Soft. Bowel sounds are normal. NT. No HSM  Musculoskeletal: Normal range of motion. Exhibits no edema Lymphadenopathy: Has no cervical adenopathy.  Neurological: Pt is alert and oriented to person, place, and time. Pt has normal reflexes. No cranial nerve deficit. Motor grossly intact , cn 2-12 intact, somewhat slowed mentation compared to several yrs ago, no  tremor Skin: Skin is warm and dry. No rash noted or new ulcers,  Psychiatric:  Has normal mood and affect. Behavior is normal.  No other new exam findings    Assessment & Plan:

## 2016-01-19 NOTE — Patient Instructions (Addendum)
You had the flu shot today  Please continue all other medications as before, and refills have been done if requested.  Please have the pharmacy call with any other refills you may need.  Please continue your efforts at being more active, low cholesterol diet, and weight control.  You are otherwise up to date with prevention measures today.  Please keep your appointments with your specialists as you may have planned  Please return in 6 months, or sooner if needed 

## 2016-01-19 NOTE — Assessment & Plan Note (Signed)
stable overall by history and exam, recent data reviewed with pt, and pt to continue medical treatment as before,  to f/u any worsening symptoms or concerns BP Readings from Last 3 Encounters:  01/19/16 128/74  12/13/15 130/70  10/06/15 130/70

## 2016-01-19 NOTE — Assessment & Plan Note (Signed)

## 2016-04-23 ENCOUNTER — Other Ambulatory Visit: Payer: Self-pay | Admitting: Internal Medicine

## 2016-06-07 NOTE — Progress Notes (Signed)
Brandon Rojas was seen today in the movement disorders clinic for neurologic consultation at the request of Biagio Borg, MD.  The consultation is for the evaluation of slow movement and to r/o PD.  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    08/19/14 update: The patient is following up today.  Last visit, the patient had signs of early Parkinson's disease, but he did not yet meet official criteria for Parkinson's disease.  He is not on any medications.  I reviewed his other records since last visit.  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  Pt states that it is still flaring intermittently.  When he isn't "hurting" he moves well.  No falls.  No tremor.  No dizziness.  Doing some walking on the treadmill but it "flares up" his piriformis syndrome.    11/23/14 update:  The patient follows up today regarding his newly diagnosed Parkinson's disease.  Last visit, I started him on carbidopa/levodopa 25/100, one tablet 3 times per day.  States that "I am doing great."  States that he is walking better.  No falls.   I also talked to him about his lisinopril.  He does have history of hypertension but he is having near syncopal episodes.  He did see cardiology last visit and I reviewed those records.  His lisinopril dosage was decreased on 09/17/2014.  No lightheadedness.  Has rare nausea and not sure if associated with the medication.  He has no wearing off of the carbidopa/levodopa 25/100.  He takes it at 8am/12pm/5pm.    03/22/15 update:  Pt f/u today for PD.    The records that were made available to me were reviewed since last visit.  Pt is on carbidopa/levodopa 25/100 tid.  He gets a little nauseated with the first pill but if he takes it with [place] it helps.  He doesn't think that it changes the efficacy of the medication at all.    Wearing off:  No.  How long before next dose:  n/a Falls:   No. Hallucinations:  No.  visual distortions:  No. Lightheaded:  No.  Syncope: No. Dyskinesia:  No.  Exercise:  Some, bikes some at home but knows that if he gets to the gym he feels better and walks better.   06/30/15 update:  The patient follows up today regarding his diagnosed Parkinson's disease.    He is on carbidopa/levodopa 25/100 tid.  No falls since last visit.  No hallucinations, lightheadedness or near syncope.  Biggest issue is back pain that radiates that radiates down the L leg.  Not been exercising due to the pain.  Had MRI lumbar spine this AM.    10/06/15 update:  The patient follows up today.  He remains on carbidopa/levodopa 25/100, one tablet 3 times per day.  He has had no falls.  No lightheadedness or near syncope.  No hallucinations.  Sleeping well.  Last visit, his biggest complaints were back pain and lumbar radiculopathy.  He was referred to ortho by his primary care physician and the patient states that he went to Dr. Oneita Kras and he was told that surgery wouldn't be indicated.  He was placed on prednisone without relief.  He was offered epidurals and didn't want that.  3 weeks ago, he started going to chiropractor in [place] and he thinks that it was helpful.  He can finally cross the leg without burning/shooting pain down the leg.  Hasn't been able to get back to vigorous CV exercise yet.  He is walking for exercise.  01/19/16 update:  Pt f/u today for PD.  The records that were made available to me were reviewed since last visit.    Still on carbidopa/levodopa 25/100 at 8:15/12:15/5:15.  Wearing off:  No.  How long before next dose:  N/a (only noted it one time and it wasn't signficiant) Falls:   No. N/V:  No. Hallucinations:  No.  visual distortions: No. Lightheaded:  No.  Syncope: No. Dyskinesia:  No.  Neuroimaging has previously been performed.  It was last done when he had a "TIA" in 2006.   06/19/16 update:  Patient is seen today in follow-up.  He is on carbidopa/levodopa 25/100 tid (8:15am/12:15pm/5:15pm).   Pt denies falls.  Pt denies lightheadedness, near syncope.  No hallucinations.  Mood has been good.  PREVIOUS MEDICATIONS: none to date  ALLERGIES:   Allergies  Allergen Reactions  . Atorvastatin Other (See Comments)    Muscle cramps  . Simvastatin Other (See Comments)    Muscle cramps    CURRENT MEDICATIONS:  Outpatient Encounter Prescriptions as of 06/19/2016  Medication Sig  . aspirin 81 MG tablet Take 81 mg by mouth daily.    . B Complex Vitamins (VITAMIN B COMPLEX PO) Take 1 tablet by mouth daily.    . carbidopa-levodopa (SINEMET IR) 25-100 MG tablet TAKE 1 TABLET THREE TIMES DAILY  . clopidogrel (PLAVIX) 75 MG tablet TAKE 1 TABLET EVERY DAY WITH BREAKFAST  . esomeprazole (NEXIUM) 40 MG capsule TAKE 1 CAPSULE EVERY DAY  . lovastatin (MEVACOR) 20 MG tablet TAKE 1 TABLET (20 MG TOTAL) BY MOUTH AT BEDTIME.  [name] NITROSTAT 0.4 MG SL tablet PLACE 1 TABLET (0.4 MG TOTAL) UNDER THE TONGUE EVERY 5 (FIVE) MINUTES AS NEEDED.  [name] Omega-3 Fatty Acids (FISH OIL) 1000 MG CAPS Take 1-2 capsules by mouth daily.    . traMADol (ULTRAM) 50 MG tablet Take 1 tablet (50 mg total) by mouth every 6 (six) hours as needed. for pain  . vitamin E 400 UNIT capsule Take 400 Units by mouth daily.     No facility-administered encounter medications on file as of 06/19/2016.     PAST MEDICAL HISTORY:   Past Medical History:  Diagnosis Date  . Anxiety state, unspecified   . Arthritis   . Benign neoplasm of colon   . Coronary atherosclerosis of unspecified type of vessel, native or graft   . Depressive disorder, not elsewhere classified   . Diaphragmatic hernia without mention of obstruction or gangrene   . Diverticulosis of colon (without mention of hemorrhage)   . Esophageal reflux   . Esophageal stricture   . Hiatal hernia   . Mitral regurgitation   . Obesity, unspecified   . Osteoarthrosis, unspecified whether generalized or localized, unspecified site   . Other and unspecified hyperlipidemia   .  Parkinson's disease ([place])   . Personal history of colonic polyps   . Personal history of unspecified circulatory disease   . Unspecified essential hypertension     PAST SURGICAL HISTORY:   Past Surgical History:  Procedure Laterality Date  . COLONOSCOPY    . CORONARY ARTERY BYPASS GRAFT     x 2  . INGUINAL HERNIA REPAIR     bilateral  . LUNG BIOPSY    . POLYPECTOMY    . PTCA      SOCIAL HISTORY:   Social History   Social History  . Marital status:  Married    Spouse name: N/A  . Number of children: 3  . Years of education: N/A   Occupational History  . [employer]   Social History Main Topics  . Smoking status: Former Smoker    Quit date: 02/19/1972  . Smokeless tobacco: Never Used  . Alcohol use No  . Drug use: No  . Sexual activity: Not on file   Other Topics Concern  . Not on file   Social History Narrative  . No narrative on file    FAMILY HISTORY:   Family Status  Relation Status  . Mother Deceased       MI  . Father Deceased       staph infection, stroke  . Sister Alive       colon cancer  . Sister Alive       healthy  . Sister Alive       healthy  . Brother Alive       Heart disease  . Brother Alive       healthy  . Brother Alive       healthy  . Brother Alive       healthy  . Daughter Alive       healthy  . Daughter Alive       fibromyalgia  . Son Alive       MS  . Sister (Not Specified)    ROS:  A complete 10 system review of systems was obtained and was unremarkable apart from what is mentioned above.  PHYSICAL EXAMINATION:    VITALS:   Vitals:   06/19/16 1102  BP: 110/80  Pulse: 60  SpO2: 97%  Weight: 142 lb 2 oz (64.5 kg)  Height: 5\' 5"  (1.651 m)   Wt Readings from Last 3 Encounters:  06/19/16 142 lb 2 oz (64.5 kg)  01/19/16 147 lb (66.7 kg)  01/19/16 148 lb (67.1 kg)     GEN:  The patient appears stated age and is in NAD. HEENT:  Normocephalic, atraumatic.  The mucous membranes are moist.  The superficial temporal arteries are without ropiness or tenderness. CV:  Bradycardic with 2/6 SEM Lungs:  CTAB Neck/HEME:  There are no carotid bruits bilaterally.  Neurological examination:  Orientation: The patient is alert and oriented x3.  Cranial nerves: There is good facial symmetry. There is facial hyopomimia.   The speech is fluent and clear but he is quite hypophonic. Soft palate rises symmetrically and there is no tongue deviation. Hearing is intact to conversational tone. Sensation: Sensation is intact to light touch throughout. Motor: Strength is 5/5 in the bilateral upper and lower extremities.   Shoulder shrug is equal and symmetric.  There is no pronator drift.   Movement examination: Tone: There is no tremor.   Abnormal movements: No tremor, minimal dyskinesia in the RLE Coordination:  There is trouble with alternation of supination/pronation of the forearm on the L and toe taps on the L.   Gait and Station: The patient gets OOC easily. The patient's stride length is normal and arm swing is good today.  Negative pull test.  ASSESSMENT/PLAN:  1.  Idiopathic Parkinson's disease, newly diagnosed August 19, 2014  -doing well on carbidopa/levodopa 25/100 tid (8/15am/12:15pm/5:15pm)  and looks good.  Continue on this medication  -Talked about the importance of safe, cardiovascular exercise.  Talked again about regular exercise.   2.  History of hypertension  -off lisinopril and doing well 3.  Low back  pain with lumbar radiculopathy  -was following with orthopedics but told no surgery indicated.  Pt thinks receiving benefit from chiropractors. 4.  Weight loss  -fairly significant over many years (20 lbs but over 3-4 years).  Told him to add in boost/ensure but to keep away from levodopa dosing.   5.  Follow up is anticipated in the next 6 months, sooner should new neurologic issues arise.  Much greater than 50% of this visit was spent in counseling with the patient.  Total  face to face time:  25 min

## 2016-06-19 ENCOUNTER — Encounter: Payer: Self-pay | Admitting: Neurology

## 2016-06-19 ENCOUNTER — Ambulatory Visit (INDEPENDENT_AMBULATORY_CARE_PROVIDER_SITE_OTHER): Payer: Medicare PPO | Admitting: Neurology

## 2016-06-19 VITALS — BP 110/80 | HR 60 | Ht 65.0 in | Wt 142.1 lb

## 2016-06-19 DIAGNOSIS — G20 Parkinson's disease: Secondary | ICD-10-CM | POA: Diagnosis not present

## 2016-06-19 DIAGNOSIS — R634 Abnormal weight loss: Secondary | ICD-10-CM

## 2016-06-19 NOTE — Patient Instructions (Signed)
1.  Take your carbidopa/levodopa 25/100 about 30 minutes prior to a protein source 2.  Add ensure or boost to help maintain weight 3.  See you in 6 months! 4.  Good to see you!

## 2016-06-25 ENCOUNTER — Other Ambulatory Visit: Payer: Self-pay | Admitting: Internal Medicine

## 2016-06-26 NOTE — Telephone Encounter (Signed)
Faxed

## 2016-06-26 NOTE — Telephone Encounter (Signed)
Done hardcopy to Shirron  

## 2016-07-05 ENCOUNTER — Other Ambulatory Visit: Payer: Self-pay | Admitting: Neurology

## 2016-07-05 MED ORDER — CARBIDOPA-LEVODOPA 25-100 MG PO TABS: 1.0000 | ORAL_TABLET | Freq: Three times a day (TID) | ORAL | 1 refills | Status: DC

## 2016-07-09 ENCOUNTER — Other Ambulatory Visit: Payer: Self-pay

## 2016-07-09 MED ORDER — LOVASTATIN 20 MG PO TABS: 20.0000 mg | ORAL_TABLET | Freq: Every day | ORAL | 1 refills | Status: DC

## 2016-07-12 ENCOUNTER — Telehealth: Payer: Self-pay

## 2016-07-12 NOTE — Telephone Encounter (Signed)
Approved 07/05/16-01/07/17 Determination has been sent to scan Appeal paperwork also sent to scan

## 2016-10-01 ENCOUNTER — Other Ambulatory Visit: Payer: Self-pay | Admitting: Internal Medicine

## 2016-12-24 NOTE — Progress Notes (Deleted)
Brandon Rojas was seen today in the movement disorders clinic for neurologic consultation at the request of Biagio Borg, MD.  The consultation is for the evaluation of slow movement and to r/o PD.  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    08/19/14 update: The patient is following up today.  Last visit, the patient had signs of early Parkinson's disease, but he did not yet meet official criteria for Parkinson's disease.  He is not on any medications.  I reviewed his other records since last visit.  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  Pt states that it is still flaring intermittently.  When he isn't "hurting" he moves well.  No falls.  No tremor.  No dizziness.  Doing some walking on the treadmill but it "flares up" his piriformis syndrome.    11/23/14 update:  The patient follows up today regarding his newly diagnosed Parkinson's disease.  Last visit, I started him on carbidopa/levodopa 25/100, one tablet 3 times per day.  States that "I am doing great."  States that he is walking better.  No falls.   I also talked to him about his lisinopril.  He does have history of hypertension but he is having near syncopal episodes.  He did see cardiology last visit and I reviewed those records.  His lisinopril dosage was decreased on 09/17/2014.  No lightheadedness.  Has rare nausea and not sure if associated with the medication.  He has no wearing off of the carbidopa/levodopa 25/100.  He takes it at 8am/12pm/5pm.    03/22/15 update:  Pt f/u today for PD.    The records that were made available to me were reviewed since last visit.  Pt is on carbidopa/levodopa 25/100 tid.  He gets a little nauseated with the first pill but if he takes it with [place] it helps.  He doesn't think that it changes the efficacy of the medication at all.    Wearing off:  No.  How long before next dose:  n/a Falls:   No. Hallucinations:  No.  visual distortions:  No. Lightheaded:  No.  Syncope: No. Dyskinesia:  No.  Exercise:  Some, bikes some at home but knows that if he gets to the gym he feels better and walks better.   06/30/15 update:  The patient follows up today regarding his diagnosed Parkinson's disease.    He is on carbidopa/levodopa 25/100 tid.  No falls since last visit.  No hallucinations, lightheadedness or near syncope.  Biggest issue is back pain that radiates that radiates down the L leg.  Not been exercising due to the pain.  Had MRI lumbar spine this AM.    10/06/15 update:  The patient follows up today.  He remains on carbidopa/levodopa 25/100, one tablet 3 times per day.  He has had no falls.  No lightheadedness or near syncope.  No hallucinations.  Sleeping well.  Last visit, his biggest complaints were back pain and lumbar radiculopathy.  He was referred to ortho by his primary care physician and the patient states that he went to Dr. Oneita Kras and he was told that surgery wouldn't be indicated.  He was placed on prednisone without relief.  He was offered epidurals and didn't want that.  3 weeks ago, he started going to chiropractor in [place] and he thinks that it was helpful.  He can finally cross the leg without burning/shooting pain down the leg.  Hasn't been able to get back to vigorous CV exercise yet.  He is walking for exercise.  01/19/16 update:  Pt f/u today for PD.  The records that were made available to me were reviewed since last visit.    Still on carbidopa/levodopa 25/100 at 8:15/12:15/5:15.  Wearing off:  No.  How long before next dose:  N/a (only noted it one time and it wasn't signficiant) Falls:   No. N/V:  No. Hallucinations:  No.  visual distortions: No. Lightheaded:  No.  Syncope: No. Dyskinesia:  No.  Neuroimaging has previously been performed.  It was last done when he had a "TIA" in 2006.   06/19/16 update:  Patient is seen today in follow-up.  He is on carbidopa/levodopa 25/100 tid (8:15am/12:15pm/5:15pm).   Pt denies falls.  Pt denies lightheadedness, near syncope.  No hallucinations.  Mood has been good.  12/25/16 update: Patient is seen today for follow-up of Parkinson's disease.  Patient remains on carbidopa/levodopa 25/100, 1 tablet 3 times per day.  The patient reports that he has been doing well.  He has had no falls.  No lightheadedness or near syncope.  His mood has been good.  He walks for exercise, but no significant cardiovascular exercise.  PREVIOUS MEDICATIONS: none to date  ALLERGIES:   Allergies  Allergen Reactions  . Atorvastatin Other (See Comments)    Muscle cramps  . Simvastatin Other (See Comments)    Muscle cramps    CURRENT MEDICATIONS:  Outpatient Encounter Medications as of 12/25/2016  Medication Sig  . aspirin 81 MG tablet Take 81 mg by mouth daily.    . B Complex Vitamins (VITAMIN B COMPLEX PO) Take 1 tablet by mouth daily.    . carbidopa-levodopa (SINEMET IR) 25-100 MG tablet Take 1 tablet by mouth 3 (three) times daily.  . clopidogrel (PLAVIX) 75 MG tablet TAKE 1 TABLET EVERY DAY WITH BREAKFAST  . esomeprazole (NEXIUM) 40 MG capsule TAKE 1 CAPSULE BY MOUTH EVERY DAY  . lovastatin (MEVACOR) 20 MG tablet Take 1 tablet (20 mg total) by mouth at bedtime.  [name] NITROSTAT 0.4 MG SL tablet PLACE 1 TABLET (0.4 MG TOTAL) UNDER THE TONGUE EVERY 5 (FIVE) MINUTES AS NEEDED.  [name] Omega-3 Fatty Acids (FISH OIL) 1000 MG CAPS Take 1-2 capsules by mouth daily.    . traMADol (ULTRAM) 50 MG tablet TAKE 1 TABLET BY MOUTH EVERY 6 HOURS AS NEEDED FOR PAIN  . vitamin E 400 UNIT capsule Take 400 Units by mouth daily.     No facility-administered encounter medications on file as of 12/25/2016.     PAST MEDICAL HISTORY:   Past Medical History:  Diagnosis Date  . Anxiety state, unspecified   . Arthritis   . Benign neoplasm of colon   . Coronary atherosclerosis of unspecified type of vessel, native or graft   . Depressive disorder, not elsewhere classified   . Diaphragmatic hernia  without mention of obstruction or gangrene   . Diverticulosis of colon (without mention of hemorrhage)   . Esophageal reflux   . Esophageal stricture   . Hiatal hernia   . Mitral regurgitation   . Obesity, unspecified   . Osteoarthrosis, unspecified whether generalized or localized, unspecified site   . Other and unspecified hyperlipidemia   . Parkinson's disease ([place])   . Personal history of colonic polyps   . Personal history of unspecified circulatory disease   . Unspecified essential hypertension     PAST SURGICAL HISTORY:   Past Surgical History:  Procedure Laterality Date  . COLONOSCOPY    . CORONARY ARTERY BYPASS GRAFT     x 2  . INGUINAL HERNIA REPAIR     bilateral  . LUNG BIOPSY    . POLYPECTOMY    . PTCA      SOCIAL HISTORY:   Social History   Socioeconomic History  . Marital status: Married    Spouse name: Not on file  . Number of children: 3  . Years of education: Not on file  . Highest education level: Not on file  Social Needs  . Financial resource strain: Not on file  . Food insecurity - worry: Not on file  . Food insecurity - inability: Not on file  . Transportation needs - medical: Not on file  . Transportation needs - non-medical: Not on file  Occupational History  . Occupation: Mining engineer: [employer]  Tobacco Use  . Smoking status: Former Smoker    Last attempt to quit: 02/19/1972    Years since quitting: 44.8  . Smokeless tobacco: Never Used  Substance and Sexual Activity  . Alcohol use: No    Alcohol/week: 0.0 oz  . Drug use: No  . Sexual activity: Not on file  Other Topics Concern  . Not on file  Social History Narrative  . Not on file    FAMILY HISTORY:   Family Status  Relation Name Status  . Mother  Deceased       MI  . Father  Deceased       staph infection, stroke  . Sister  Alive       colon cancer  . Sister  Alive       healthy  . Sister  Alive       healthy  . Brother  Alive       Heart  disease  . Brother  Alive       healthy  . Brother  Alive       healthy  . Brother  Alive       healthy  . Daughter  Alive       healthy  . Daughter  Alive       fibromyalgia  . Son  Alive       MS  . Sister  (Not Specified)    ROS:  A complete 10 system review of systems was obtained and was unremarkable apart from what is mentioned above.  PHYSICAL EXAMINATION:    VITALS:   There were no vitals filed for this visit. Wt Readings from Last 3 Encounters:  06/19/16 142 lb 2 oz (64.5 kg)  01/19/16 147 lb (66.7 kg)  01/19/16 148 lb (67.1 kg)     GEN:  The patient appears stated age and is in NAD. HEENT:  Normocephalic, atraumatic.  The mucous membranes are moist. The superficial temporal arteries are without ropiness or tenderness. CV:  Bradycardic with 2/6 SEM Lungs:  CTAB Neck/HEME:  There are no carotid bruits bilaterally.  Neurological examination:  Orientation: The patient is alert and oriented x3.  Cranial nerves: There is good facial symmetry. There is facial hyopomimia.   The speech is fluent and clear but he is quite hypophonic. Soft palate rises symmetrically and there is no tongue deviation. Hearing is intact to conversational tone. Sensation: Sensation is intact to light touch throughout. Motor: Strength is 5/5 in the bilateral upper and lower extremities.   Shoulder shrug is equal and symmetric.  There is  no pronator drift.   Movement examination: Tone: There is no tremor.   Abnormal movements: No tremor, minimal dyskinesia in the RLE Coordination:  There is trouble with alternation of supination/pronation of the forearm on the L and toe taps on the L.   Gait and Station: The patient gets OOC easily. The patient's stride length is normal and arm swing is good today.  Negative pull test.  ASSESSMENT/PLAN:  1.  Idiopathic Parkinson's disease, newly diagnosed August 19, 2014  -doing well on carbidopa/levodopa 25/100 tid (8/15am/12:15pm/5:15pm)  and looks  good.  Continue on this medication  -Talked about the importance of safe, cardiovascular exercise.  Talked again about regular exercise.   2.  History of hypertension  -off lisinopril and doing well 3.  Low back pain with lumbar radiculopathy  -was following with orthopedics but told no surgery indicated.  Pt thinks receiving benefit from chiropractors. 4.  Weight loss  -fairly significant over many years (20 lbs but over 3-4 years).  Told him to add in boost/ensure but to keep away from levodopa dosing.   5.  Follow up is anticipated in the next 6 months, sooner should new neurologic issues arise.  Much greater than 50% of this visit was spent in counseling with the patient.  Total face to face time:  25 min

## 2016-12-25 ENCOUNTER — Ambulatory Visit: Payer: Medicare PPO | Admitting: Neurology

## 2016-12-29 ENCOUNTER — Other Ambulatory Visit: Payer: Self-pay | Admitting: Internal Medicine

## 2016-12-31 ENCOUNTER — Other Ambulatory Visit: Payer: Self-pay | Admitting: Internal Medicine

## 2016-12-31 ENCOUNTER — Other Ambulatory Visit: Payer: Self-pay | Admitting: Neurology

## 2017-01-11 ENCOUNTER — Other Ambulatory Visit: Payer: Self-pay | Admitting: Internal Medicine

## 2017-01-14 NOTE — Telephone Encounter (Signed)
Tramadol done erx  Shirron to please let pt know he is due for ROV for further refills

## 2017-01-17 NOTE — Progress Notes (Signed)
Brandon Rojas was seen today in the movement disorders clinic for neurologic consultation at the request of Biagio Borg, MD.  The consultation is for the evaluation of slow movement and to r/o PD.  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    08/19/14 update: The patient is following up today.  Last visit, the patient had signs of early Parkinson's disease, but he did not yet meet official criteria for Parkinson's disease.  He is not on any medications.  I reviewed his other records since last visit.  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  Pt states that it is still flaring intermittently.  When he isn't "hurting" he moves well.  No falls.  No tremor.  No dizziness.  Doing some walking on the treadmill but it "flares up" his piriformis syndrome.    11/23/14 update:  The patient follows up today regarding his newly diagnosed Parkinson's disease.  Last visit, I started him on carbidopa/levodopa 25/100, one tablet 3 times per day.  States that "I am doing great."  States that he is walking better.  No falls.   I also talked to him about his lisinopril.  He does have history of hypertension but he is having near syncopal episodes.  He did see cardiology last visit and I reviewed those records.  His lisinopril dosage was decreased on 09/17/2014.  No lightheadedness.  Has rare nausea and not sure if associated with the medication.  He has no wearing off of the carbidopa/levodopa 25/100.  He takes it at 8am/12pm/5pm.    03/22/15 update:  Pt f/u today for PD.    The records that were made available to me were reviewed since last visit.  Pt is on carbidopa/levodopa 25/100 tid.  He gets a little nauseated with the first pill but if he takes it with [place] it helps.  He doesn't think that it changes the efficacy of the medication at all.    Wearing off:  No.  How long before next dose:  n/a Falls:   No. Hallucinations:  No.  visual distortions:  No. Lightheaded:  No.  Syncope: No. Dyskinesia:  No.  Exercise:  Some, bikes some at home but knows that if he gets to the gym he feels better and walks better.   06/30/15 update:  The patient follows up today regarding his diagnosed Parkinson's disease.    He is on carbidopa/levodopa 25/100 tid.  No falls since last visit.  No hallucinations, lightheadedness or near syncope.  Biggest issue is back pain that radiates that radiates down the L leg.  Not been exercising due to the pain.  Had MRI lumbar spine this AM.    10/06/15 update:  The patient follows up today.  He remains on carbidopa/levodopa 25/100, one tablet 3 times per day.  He has had no falls.  No lightheadedness or near syncope.  No hallucinations.  Sleeping well.  Last visit, his biggest complaints were back pain and lumbar radiculopathy.  He was referred to ortho by his primary care physician and the patient states that he went to Dr. Oneita Kras and he was told that surgery wouldn't be indicated.  He was placed on prednisone without relief.  He was offered epidurals and didn't want that.  3 weeks ago, he started going to chiropractor in [place] and he thinks that it was helpful.  He can finally cross the leg without burning/shooting pain down the leg.  Hasn't been able to get back to vigorous CV exercise yet.  He is walking for exercise.  01/19/16 update:  Pt f/u today for PD.  The records that were made available to me were reviewed since last visit.    Still on carbidopa/levodopa 25/100 at 8:15/12:15/5:15.  Wearing off:  No.  How long before next dose:  N/a (only noted it one time and it wasn't signficiant) Falls:   No. N/V:  No. Hallucinations:  No.  visual distortions: No. Lightheaded:  No.  Syncope: No. Dyskinesia:  No.  Neuroimaging has previously been performed.  It was last done when he had a "TIA" in 2006.   06/19/16 update:  Patient is seen today in follow-up.  He is on carbidopa/levodopa 25/100 tid (8:15am/12:15pm/5:15pm).   Pt denies falls.  Pt denies lightheadedness, near syncope.  No hallucinations.  Mood has been good.  01/18/17 update: Patient is seen today for follow-up of Parkinson's disease.  Patient remains on carbidopa/levodopa 25/100, 1 tablet 3 times per day.  The patient reports that he has been doing well.  He has had no falls.  No lightheadedness or near syncope.  His mood has been good.  He hasn't been able to do much exercise, even walking.  Having L hip pain.  Using walking stick some.  Told he could have injection but he was worried about getting off plavix.  Voice getting weaker.    PREVIOUS MEDICATIONS: none to date  ALLERGIES:   Allergies  Allergen Reactions  . Atorvastatin Other (See Comments)    Muscle cramps  . Simvastatin Other (See Comments)    Muscle cramps    CURRENT MEDICATIONS:  Outpatient Encounter Medications as of 01/18/2017  Medication Sig  . aspirin 81 MG tablet Take 81 mg by mouth daily.    . B Complex Vitamins (VITAMIN B COMPLEX PO) Take 1 tablet by mouth daily.    . carbidopa-levodopa (SINEMET IR) 25-100 MG tablet TAKE 1 TABLET THREE TIMES DAILY  . clopidogrel (PLAVIX) 75 MG tablet TAKE 1 TABLET EVERY DAY WITH BREAKFAST  . esomeprazole (NEXIUM) 40 MG capsule TAKE 1 CAPSULE BY MOUTH EVERY DAY  . lovastatin (MEVACOR) 20 MG tablet TAKE 1 TABLET (20 MG TOTAL) BY MOUTH AT BEDTIME.  [name] Omega-3 Fatty Acids (FISH OIL) 1000 MG CAPS Take 1-2 capsules by mouth daily.    . traMADol (ULTRAM) 50 MG tablet TAKE 1 TABLET BY MOUTH EVERY 6 HOURS AS NEEDED FOR PAIN  . vitamin E 400 UNIT capsule Take 400 Units by mouth daily.    [name] NITROSTAT 0.4 MG SL tablet PLACE 1 TABLET (0.4 MG TOTAL) UNDER THE TONGUE EVERY 5 (FIVE) MINUTES AS NEEDED. (Patient not taking: Reported on 01/18/2017)   No facility-administered encounter medications on file as of 01/18/2017.     PAST MEDICAL HISTORY:   Past Medical History:  Diagnosis Date  . Anxiety state, unspecified   . Arthritis   . Benign neoplasm of  colon   . Coronary atherosclerosis of unspecified type of vessel, native or graft   . Depressive disorder, not elsewhere classified   . Diaphragmatic hernia without mention of obstruction or gangrene   . Diverticulosis of colon (without mention of hemorrhage)   . Esophageal reflux   . Esophageal stricture   . Hiatal hernia   . Mitral regurgitation   . Obesity, unspecified   . Osteoarthrosis, unspecified whether generalized or localized, unspecified site   . Other and unspecified hyperlipidemia   . Parkinson's disease ([place])   .  Personal history of colonic polyps   . Personal history of unspecified circulatory disease   . Unspecified essential hypertension     PAST SURGICAL HISTORY:   Past Surgical History:  Procedure Laterality Date  . COLONOSCOPY    . CORONARY ARTERY BYPASS GRAFT     x 2  . INGUINAL HERNIA REPAIR     bilateral  . LUNG BIOPSY    . POLYPECTOMY    . PTCA      SOCIAL HISTORY:   Social History   Socioeconomic History  . Marital status: Married    Spouse name: Not on file  . Number of children: 3  . Years of education: Not on file  . Highest education level: Not on file  Social Needs  . Financial resource strain: Not on file  . Food insecurity - worry: Not on file  . Food insecurity - inability: Not on file  . Transportation needs - medical: Not on file  . Transportation needs - non-medical: Not on file  Occupational History  . Occupation: Mining engineer: [employer]  Tobacco Use  . Smoking status: Former Smoker    Last attempt to quit: 02/19/1972    Years since quitting: 44.9  . Smokeless tobacco: Never Used  Substance and Sexual Activity  . Alcohol use: No    Alcohol/week: 0.0 oz  . Drug use: No  . Sexual activity: Not on file  Other Topics Concern  . Not on file  Social History Narrative  . Not on file    FAMILY HISTORY:   Family Status  Relation Name Status  . Mother  Deceased       MI  . Father  Deceased        staph infection, stroke  . Sister  Alive       colon cancer  . Sister  Alive       healthy  . Sister  Alive       healthy  . Brother  Alive       Heart disease  . Brother  Alive       healthy  . Brother  Alive       healthy  . Brother  Alive       healthy  . Daughter  Alive       healthy  . Daughter  Alive       fibromyalgia  . Son  Alive       MS  . Sister  (Not Specified)    ROS:  A complete 10 system review of systems was obtained and was unremarkable apart from what is mentioned above.  PHYSICAL EXAMINATION:    VITALS:   Vitals:   01/18/17 1450  BP: 136/88  Pulse: (!) 56  SpO2: 97%  Weight: 144 lb (65.3 kg)  Height: 5\' 5"  (1.651 m)   Wt Readings from Last 3 Encounters:  01/18/17 144 lb (65.3 kg)  06/19/16 142 lb 2 oz (64.5 kg)  01/19/16 147 lb (66.7 kg)     GEN:  The patient appears stated age and is in NAD. HEENT:  Normocephalic, atraumatic.  The mucous membranes are moist. The superficial temporal arteries are without ropiness or tenderness. CV:  Bradycardic with 2/6 SEM Lungs:  CTAB Neck/HEME:  There are no carotid bruits bilaterally.  Neurological examination:  Orientation: The patient is alert and oriented x3.  Cranial nerves: There is good facial symmetry. There is facial hyopomimia.   The speech is  fluent and clear but he is quite hypophonic. Soft palate rises symmetrically and there is no tongue deviation. Hearing is intact to conversational tone. Sensation: Sensation is intact to light touch throughout. Motor: Strength is 5/5 in the bilateral upper and lower extremities.   Shoulder shrug is equal and symmetric.  There is no pronator drift.   Movement examination: Tone: There is no tremor.   Abnormal movements: There is mild RLE dyskinesia Coordination:  There is trouble with alternation of supination/pronation of the forearm on the L and toe taps on the L.   Gait and Station: The patient gets OOC easily. The patient's stride length is normal  and arm swing is good today.  Negative pull test.  ASSESSMENT/PLAN:  1.  Idiopathic Parkinson's disease, newly diagnosed August 19, 2014  -continues to do well on carbidopa/levodopa 25/100 tid.  Risks, benefits, side effects and alternative therapies were discussed.  The opportunity to ask questions was given and they were answered to the best of my ability.  The patient expressed understanding and willingness to follow the outlined treatment protocols.  -refer for LSVT in [place] or close  -handicap placard filled out 2.  History of hypertension  -off lisinopril and doing well 3.  Low back pain with lumbar radiculopathy  -was following with orthopedics but told no surgery indicated.    -now with L hip pain.  Needs to get back in with ortho.  Told him that while small risk of getting off of plavix (he was worried) but worth it if injection will help. 4.  Weight loss  -fairly significant over many years (20 lbs but over 3-4 years) but stable today 5.  Follow up is anticipated in the next few months, sooner should new neurologic issues arise.  Much greater than 50% of this visit was spent in counseling and coordinating care.  Total face to face time:  25 min

## 2017-01-18 ENCOUNTER — Ambulatory Visit: Payer: Medicare PPO | Admitting: Neurology

## 2017-01-18 ENCOUNTER — Encounter: Payer: Self-pay | Admitting: Neurology

## 2017-01-18 VITALS — BP 136/88 | HR 56 | Ht 65.0 in | Wt 144.0 lb

## 2017-01-18 DIAGNOSIS — G20 Parkinson's disease: Secondary | ICD-10-CM | POA: Diagnosis not present

## 2017-01-18 NOTE — Addendum Note (Signed)
Addended byAnnamaria Helling on: 01/18/2017 03:11 PM   Modules accepted: Orders

## 2017-01-18 NOTE — Patient Instructions (Signed)
1. We will send a referral for speech therapy.   2. Follow up 4-5 months.

## 2017-01-22 ENCOUNTER — Telehealth: Payer: Self-pay | Admitting: Neurology

## 2017-01-22 NOTE — Telephone Encounter (Signed)
Referral faxed to [facility] outpatient rehab for speech therapy at [phone] with confirmation received.

## 2017-02-07 DIAGNOSIS — R2689 Other abnormalities of gait and mobility: Secondary | ICD-10-CM | POA: Diagnosis not present

## 2017-02-07 DIAGNOSIS — G20 Parkinson's disease: Secondary | ICD-10-CM | POA: Diagnosis not present

## 2017-02-07 DIAGNOSIS — M545 Low back pain: Secondary | ICD-10-CM | POA: Diagnosis not present

## 2017-02-07 DIAGNOSIS — M2560 Stiffness of unspecified joint, not elsewhere classified: Secondary | ICD-10-CM | POA: Diagnosis not present

## 2017-02-07 DIAGNOSIS — R293 Abnormal posture: Secondary | ICD-10-CM | POA: Diagnosis not present

## 2017-02-07 DIAGNOSIS — R471 Dysarthria and anarthria: Secondary | ICD-10-CM | POA: Diagnosis not present

## 2017-02-12 DIAGNOSIS — M545 Low back pain: Secondary | ICD-10-CM | POA: Diagnosis not present

## 2017-02-12 DIAGNOSIS — G20 Parkinson's disease: Secondary | ICD-10-CM | POA: Diagnosis not present

## 2017-02-12 DIAGNOSIS — R293 Abnormal posture: Secondary | ICD-10-CM | POA: Diagnosis not present

## 2017-02-12 DIAGNOSIS — R471 Dysarthria and anarthria: Secondary | ICD-10-CM | POA: Diagnosis not present

## 2017-02-12 DIAGNOSIS — R2689 Other abnormalities of gait and mobility: Secondary | ICD-10-CM | POA: Diagnosis not present

## 2017-02-12 DIAGNOSIS — M2560 Stiffness of unspecified joint, not elsewhere classified: Secondary | ICD-10-CM | POA: Diagnosis not present

## 2017-02-14 DIAGNOSIS — M2560 Stiffness of unspecified joint, not elsewhere classified: Secondary | ICD-10-CM | POA: Diagnosis not present

## 2017-02-14 DIAGNOSIS — R293 Abnormal posture: Secondary | ICD-10-CM | POA: Diagnosis not present

## 2017-02-14 DIAGNOSIS — R2689 Other abnormalities of gait and mobility: Secondary | ICD-10-CM | POA: Diagnosis not present

## 2017-02-14 DIAGNOSIS — G20 Parkinson's disease: Secondary | ICD-10-CM | POA: Diagnosis not present

## 2017-02-14 DIAGNOSIS — M545 Low back pain: Secondary | ICD-10-CM | POA: Diagnosis not present

## 2017-02-14 DIAGNOSIS — R471 Dysarthria and anarthria: Secondary | ICD-10-CM | POA: Diagnosis not present

## 2017-02-19 DIAGNOSIS — M2560 Stiffness of unspecified joint, not elsewhere classified: Secondary | ICD-10-CM | POA: Diagnosis not present

## 2017-02-19 DIAGNOSIS — M545 Low back pain: Secondary | ICD-10-CM | POA: Diagnosis not present

## 2017-02-19 DIAGNOSIS — R293 Abnormal posture: Secondary | ICD-10-CM | POA: Diagnosis not present

## 2017-02-19 DIAGNOSIS — R471 Dysarthria and anarthria: Secondary | ICD-10-CM | POA: Diagnosis not present

## 2017-02-19 DIAGNOSIS — R2689 Other abnormalities of gait and mobility: Secondary | ICD-10-CM | POA: Diagnosis not present

## 2017-02-19 DIAGNOSIS — G20 Parkinson's disease: Secondary | ICD-10-CM | POA: Diagnosis not present

## 2017-02-21 DIAGNOSIS — R2689 Other abnormalities of gait and mobility: Secondary | ICD-10-CM | POA: Diagnosis not present

## 2017-02-21 DIAGNOSIS — G20 Parkinson's disease: Secondary | ICD-10-CM | POA: Diagnosis not present

## 2017-02-21 DIAGNOSIS — M545 Low back pain: Secondary | ICD-10-CM | POA: Diagnosis not present

## 2017-02-21 DIAGNOSIS — R471 Dysarthria and anarthria: Secondary | ICD-10-CM | POA: Diagnosis not present

## 2017-02-21 DIAGNOSIS — M2560 Stiffness of unspecified joint, not elsewhere classified: Secondary | ICD-10-CM | POA: Diagnosis not present

## 2017-02-21 DIAGNOSIS — R293 Abnormal posture: Secondary | ICD-10-CM | POA: Diagnosis not present

## 2017-02-25 DIAGNOSIS — M545 Low back pain: Secondary | ICD-10-CM | POA: Diagnosis not present

## 2017-02-25 DIAGNOSIS — R2689 Other abnormalities of gait and mobility: Secondary | ICD-10-CM | POA: Diagnosis not present

## 2017-02-25 DIAGNOSIS — M2560 Stiffness of unspecified joint, not elsewhere classified: Secondary | ICD-10-CM | POA: Diagnosis not present

## 2017-02-25 DIAGNOSIS — R293 Abnormal posture: Secondary | ICD-10-CM | POA: Diagnosis not present

## 2017-02-25 DIAGNOSIS — G20 Parkinson's disease: Secondary | ICD-10-CM | POA: Diagnosis not present

## 2017-02-25 DIAGNOSIS — R471 Dysarthria and anarthria: Secondary | ICD-10-CM | POA: Diagnosis not present

## 2017-02-26 DIAGNOSIS — R471 Dysarthria and anarthria: Secondary | ICD-10-CM | POA: Diagnosis not present

## 2017-02-26 DIAGNOSIS — R293 Abnormal posture: Secondary | ICD-10-CM | POA: Diagnosis not present

## 2017-02-26 DIAGNOSIS — R2689 Other abnormalities of gait and mobility: Secondary | ICD-10-CM | POA: Diagnosis not present

## 2017-02-26 DIAGNOSIS — M2560 Stiffness of unspecified joint, not elsewhere classified: Secondary | ICD-10-CM | POA: Diagnosis not present

## 2017-02-26 DIAGNOSIS — G20 Parkinson's disease: Secondary | ICD-10-CM | POA: Diagnosis not present

## 2017-02-26 DIAGNOSIS — M545 Low back pain: Secondary | ICD-10-CM | POA: Diagnosis not present

## 2017-02-28 DIAGNOSIS — R2689 Other abnormalities of gait and mobility: Secondary | ICD-10-CM | POA: Diagnosis not present

## 2017-02-28 DIAGNOSIS — R293 Abnormal posture: Secondary | ICD-10-CM | POA: Diagnosis not present

## 2017-02-28 DIAGNOSIS — G20 Parkinson's disease: Secondary | ICD-10-CM | POA: Diagnosis not present

## 2017-02-28 DIAGNOSIS — M545 Low back pain: Secondary | ICD-10-CM | POA: Diagnosis not present

## 2017-02-28 DIAGNOSIS — R471 Dysarthria and anarthria: Secondary | ICD-10-CM | POA: Diagnosis not present

## 2017-02-28 DIAGNOSIS — M2560 Stiffness of unspecified joint, not elsewhere classified: Secondary | ICD-10-CM | POA: Diagnosis not present

## 2017-03-01 DIAGNOSIS — R2689 Other abnormalities of gait and mobility: Secondary | ICD-10-CM | POA: Diagnosis not present

## 2017-03-01 DIAGNOSIS — R293 Abnormal posture: Secondary | ICD-10-CM | POA: Diagnosis not present

## 2017-03-01 DIAGNOSIS — R471 Dysarthria and anarthria: Secondary | ICD-10-CM | POA: Diagnosis not present

## 2017-03-01 DIAGNOSIS — G20 Parkinson's disease: Secondary | ICD-10-CM | POA: Diagnosis not present

## 2017-03-01 DIAGNOSIS — M2560 Stiffness of unspecified joint, not elsewhere classified: Secondary | ICD-10-CM | POA: Diagnosis not present

## 2017-03-01 DIAGNOSIS — M545 Low back pain: Secondary | ICD-10-CM | POA: Diagnosis not present

## 2017-03-04 DIAGNOSIS — R471 Dysarthria and anarthria: Secondary | ICD-10-CM | POA: Diagnosis not present

## 2017-03-04 DIAGNOSIS — R2689 Other abnormalities of gait and mobility: Secondary | ICD-10-CM | POA: Diagnosis not present

## 2017-03-04 DIAGNOSIS — M2560 Stiffness of unspecified joint, not elsewhere classified: Secondary | ICD-10-CM | POA: Diagnosis not present

## 2017-03-04 DIAGNOSIS — G20 Parkinson's disease: Secondary | ICD-10-CM | POA: Diagnosis not present

## 2017-03-04 DIAGNOSIS — M545 Low back pain: Secondary | ICD-10-CM | POA: Diagnosis not present

## 2017-03-04 DIAGNOSIS — R293 Abnormal posture: Secondary | ICD-10-CM | POA: Diagnosis not present

## 2017-03-05 DIAGNOSIS — G20 Parkinson's disease: Secondary | ICD-10-CM | POA: Diagnosis not present

## 2017-03-05 DIAGNOSIS — M545 Low back pain: Secondary | ICD-10-CM | POA: Diagnosis not present

## 2017-03-05 DIAGNOSIS — R293 Abnormal posture: Secondary | ICD-10-CM | POA: Diagnosis not present

## 2017-03-05 DIAGNOSIS — R471 Dysarthria and anarthria: Secondary | ICD-10-CM | POA: Diagnosis not present

## 2017-03-05 DIAGNOSIS — R2689 Other abnormalities of gait and mobility: Secondary | ICD-10-CM | POA: Diagnosis not present

## 2017-03-05 DIAGNOSIS — M2560 Stiffness of unspecified joint, not elsewhere classified: Secondary | ICD-10-CM | POA: Diagnosis not present

## 2017-03-06 ENCOUNTER — Encounter: Payer: Self-pay | Admitting: Internal Medicine

## 2017-03-07 DIAGNOSIS — M2560 Stiffness of unspecified joint, not elsewhere classified: Secondary | ICD-10-CM | POA: Diagnosis not present

## 2017-03-07 DIAGNOSIS — M545 Low back pain: Secondary | ICD-10-CM | POA: Diagnosis not present

## 2017-03-07 DIAGNOSIS — R2689 Other abnormalities of gait and mobility: Secondary | ICD-10-CM | POA: Diagnosis not present

## 2017-03-07 DIAGNOSIS — R293 Abnormal posture: Secondary | ICD-10-CM | POA: Diagnosis not present

## 2017-03-07 DIAGNOSIS — R471 Dysarthria and anarthria: Secondary | ICD-10-CM | POA: Diagnosis not present

## 2017-03-07 DIAGNOSIS — G20 Parkinson's disease: Secondary | ICD-10-CM | POA: Diagnosis not present

## 2017-03-08 DIAGNOSIS — R2689 Other abnormalities of gait and mobility: Secondary | ICD-10-CM | POA: Diagnosis not present

## 2017-03-08 DIAGNOSIS — G20 Parkinson's disease: Secondary | ICD-10-CM | POA: Diagnosis not present

## 2017-03-08 DIAGNOSIS — R293 Abnormal posture: Secondary | ICD-10-CM | POA: Diagnosis not present

## 2017-03-08 DIAGNOSIS — R471 Dysarthria and anarthria: Secondary | ICD-10-CM | POA: Diagnosis not present

## 2017-03-08 DIAGNOSIS — M2560 Stiffness of unspecified joint, not elsewhere classified: Secondary | ICD-10-CM | POA: Diagnosis not present

## 2017-03-08 DIAGNOSIS — M545 Low back pain: Secondary | ICD-10-CM | POA: Diagnosis not present

## 2017-03-12 DIAGNOSIS — R293 Abnormal posture: Secondary | ICD-10-CM | POA: Diagnosis not present

## 2017-03-12 DIAGNOSIS — R471 Dysarthria and anarthria: Secondary | ICD-10-CM | POA: Diagnosis not present

## 2017-03-12 DIAGNOSIS — M2560 Stiffness of unspecified joint, not elsewhere classified: Secondary | ICD-10-CM | POA: Diagnosis not present

## 2017-03-12 DIAGNOSIS — M545 Low back pain: Secondary | ICD-10-CM | POA: Diagnosis not present

## 2017-03-12 DIAGNOSIS — R2689 Other abnormalities of gait and mobility: Secondary | ICD-10-CM | POA: Diagnosis not present

## 2017-03-12 DIAGNOSIS — G20 Parkinson's disease: Secondary | ICD-10-CM | POA: Diagnosis not present

## 2017-03-14 DIAGNOSIS — M2560 Stiffness of unspecified joint, not elsewhere classified: Secondary | ICD-10-CM | POA: Diagnosis not present

## 2017-03-14 DIAGNOSIS — R471 Dysarthria and anarthria: Secondary | ICD-10-CM | POA: Diagnosis not present

## 2017-03-14 DIAGNOSIS — M545 Low back pain: Secondary | ICD-10-CM | POA: Diagnosis not present

## 2017-03-14 DIAGNOSIS — G20 Parkinson's disease: Secondary | ICD-10-CM | POA: Diagnosis not present

## 2017-03-14 DIAGNOSIS — R293 Abnormal posture: Secondary | ICD-10-CM | POA: Diagnosis not present

## 2017-03-14 DIAGNOSIS — R2689 Other abnormalities of gait and mobility: Secondary | ICD-10-CM | POA: Diagnosis not present

## 2017-03-15 DIAGNOSIS — R471 Dysarthria and anarthria: Secondary | ICD-10-CM | POA: Diagnosis not present

## 2017-03-15 DIAGNOSIS — M2560 Stiffness of unspecified joint, not elsewhere classified: Secondary | ICD-10-CM | POA: Diagnosis not present

## 2017-03-15 DIAGNOSIS — R2689 Other abnormalities of gait and mobility: Secondary | ICD-10-CM | POA: Diagnosis not present

## 2017-03-15 DIAGNOSIS — G20 Parkinson's disease: Secondary | ICD-10-CM | POA: Diagnosis not present

## 2017-03-15 DIAGNOSIS — R293 Abnormal posture: Secondary | ICD-10-CM | POA: Diagnosis not present

## 2017-03-15 DIAGNOSIS — M545 Low back pain: Secondary | ICD-10-CM | POA: Diagnosis not present

## 2017-04-01 ENCOUNTER — Other Ambulatory Visit: Payer: Self-pay | Admitting: Internal Medicine

## 2017-04-18 ENCOUNTER — Other Ambulatory Visit: Payer: Self-pay | Admitting: Internal Medicine

## 2017-05-06 ENCOUNTER — Other Ambulatory Visit (INDEPENDENT_AMBULATORY_CARE_PROVIDER_SITE_OTHER): Payer: Medicare PPO

## 2017-05-06 ENCOUNTER — Ambulatory Visit (INDEPENDENT_AMBULATORY_CARE_PROVIDER_SITE_OTHER): Payer: Medicare PPO | Admitting: Internal Medicine

## 2017-05-06 ENCOUNTER — Encounter: Payer: Self-pay | Admitting: Internal Medicine

## 2017-05-06 ENCOUNTER — Telehealth: Payer: Self-pay | Admitting: Internal Medicine

## 2017-05-06 VITALS — BP 118/74 | HR 95 | Temp 97.8°F | Ht 65.0 in | Wt 146.0 lb

## 2017-05-06 DIAGNOSIS — Z0000 Encounter for general adult medical examination without abnormal findings: Secondary | ICD-10-CM

## 2017-05-06 DIAGNOSIS — I10 Essential (primary) hypertension: Secondary | ICD-10-CM | POA: Diagnosis not present

## 2017-05-06 LAB — CBC WITH DIFFERENTIAL/PLATELET
BASOS ABS: 0.1 10*3/uL (ref 0.0–0.1)
Basophils Relative: 1.1 % (ref 0.0–3.0)
Eosinophils Absolute: 0.6 10*3/uL (ref 0.0–0.7)
Eosinophils Relative: 8.7 % — ABNORMAL HIGH (ref 0.0–5.0)
HEMATOCRIT: 41.9 % (ref 39.0–52.0)
HEMOGLOBIN: 14.1 g/dL (ref 13.0–17.0)
LYMPHS PCT: 24.7 % (ref 12.0–46.0)
Lymphs Abs: 1.7 10*3/uL (ref 0.7–4.0)
MCHC: 33.6 g/dL (ref 30.0–36.0)
MCV: 89.6 fl (ref 78.0–100.0)
MONOS PCT: 9.9 % (ref 3.0–12.0)
Monocytes Absolute: 0.7 10*3/uL (ref 0.1–1.0)
NEUTROS ABS: 3.9 10*3/uL (ref 1.4–7.7)
Neutrophils Relative %: 55.6 % (ref 43.0–77.0)
PLATELETS: 235 10*3/uL (ref 150.0–400.0)
RBC: 4.67 Mil/uL (ref 4.22–5.81)
RDW: 14.3 % (ref 11.5–15.5)
WBC: 7 10*3/uL (ref 4.0–10.5)

## 2017-05-06 LAB — TSH: TSH: 2.03 u[IU]/mL (ref 0.35–4.50)

## 2017-05-06 LAB — HEPATIC FUNCTION PANEL
ALBUMIN: 3.8 g/dL (ref 3.5–5.2)
ALK PHOS: 73 U/L (ref 39–117)
ALT: 5 U/L (ref 0–53)
AST: 14 U/L (ref 0–37)
Bilirubin, Direct: 0.1 mg/dL (ref 0.0–0.3)
TOTAL PROTEIN: 6.3 g/dL (ref 6.0–8.3)
Total Bilirubin: 0.4 mg/dL (ref 0.2–1.2)

## 2017-05-06 LAB — URINALYSIS, ROUTINE W REFLEX MICROSCOPIC
Bilirubin Urine: NEGATIVE
HGB URINE DIPSTICK: NEGATIVE
Ketones, ur: NEGATIVE
LEUKOCYTES UA: NEGATIVE
Nitrite: NEGATIVE
RBC / HPF: NONE SEEN (ref 0–?)
Specific Gravity, Urine: <=1.005 — AB (ref 1.000–1.030)
TOTAL PROTEIN, URINE-UPE24: NEGATIVE
Urine Glucose: NEGATIVE
Urobilinogen, UA: 0.2 (ref 0.0–1.0)
WBC, UA: NONE SEEN (ref 0–?)
pH: 6 (ref 5.0–8.0)

## 2017-05-06 LAB — BASIC METABOLIC PANEL
BUN: 13 mg/dL (ref 6–23)
CALCIUM: 9.4 mg/dL (ref 8.4–10.5)
CO2: 32 mEq/L (ref 19–32)
CREATININE: 1.13 mg/dL (ref 0.40–1.50)
Chloride: 105 mEq/L (ref 96–112)
GFR: 67.61 mL/min (ref >60.00)
Glucose, Bld: 99 mg/dL (ref 70–99)
Potassium: 4 mEq/L (ref 3.5–5.1)
Sodium: 142 mEq/L (ref 135–145)

## 2017-05-06 LAB — LIPID PANEL
CHOL/HDL RATIO: 2
Cholesterol: 131 mg/dL (ref 0–200)
HDL: 56.6 mg/dL (ref >39.00)
LDL Cholesterol: 50 mg/dL (ref 0–99)
NONHDL: 74.61
TRIGLYCERIDES: 125 mg/dL (ref 0.0–149.0)
VLDL: 25 mg/dL (ref 0.0–40.0)

## 2017-05-06 LAB — PSA: PSA: 1.68 ng/mL (ref 0.10–4.00)

## 2017-05-06 MED ORDER — LOVASTATIN 20 MG PO TABS: 20.0000 mg | ORAL_TABLET | Freq: Every day | ORAL | 0 refills | Status: DC

## 2017-05-06 MED ORDER — CLOPIDOGREL BISULFATE 75 MG PO TABS: ORAL_TABLET | ORAL | 1 refills | Status: DC

## 2017-05-06 MED ORDER — TRAMADOL HCL 50 MG PO TABS: 50.0000 mg | ORAL_TABLET | Freq: Four times a day (QID) | ORAL | 5 refills | Status: DC | PRN

## 2017-05-06 MED ORDER — ESOMEPRAZOLE MAGNESIUM 40 MG PO CPDR: 40.0000 mg | DELAYED_RELEASE_CAPSULE | Freq: Every day | ORAL | 3 refills | Status: DC

## 2017-05-06 NOTE — Telephone Encounter (Signed)
Patient needs clopidogrel (PLAVIX) 75 MG tablet and lovastatin (MEVACOR) 20 MG tablet sent to [facility].

## 2017-05-06 NOTE — Progress Notes (Signed)
Subjective:    Patient ID: Brandon Rojas, male    DOB: July 28, 1945, 72 y.o.   MRN: [id number]  HPI  Here for wellness and f/u;  Overall doing ok;  Pt denies Chest pain, worsening SOB, DOE, wheezing, orthopnea, PND, worsening LE edema, palpitations, dizziness or syncope.  Pt denies neurological change such as new headache, facial or extremity weakness.  Pt denies polydipsia, polyuria, or low sugar symptoms. Pt states overall good compliance with treatment and medications, good tolerability, and has been trying to follow appropriate diet.  Pt denies worsening depressive symptoms, suicidal ideation or panic. No fever, night sweats, wt loss, loss of appetite, or other constitutional symptoms.  Pt states good ability with ADL's, has low fall risk, home safety reviewed and adequate, no other significant changes in hearing or vision, and no longer active with exercise.  Pt Believes his PD ok for now, controlled with meds, no falls.    Pt due for colonoscopy and plans to ask cardiology about whether can hold the plavix/asa.  Pt continues to have recurring LBP without change in severity, bowel or bladder change, fever, wt loss,  worsening LE pain/numbness/weakness, gait change or falls.  No other complaints or new history Past Medical History:  Diagnosis Date  . Anxiety state, unspecified   . Arthritis   . Benign neoplasm of colon   . Coronary atherosclerosis of unspecified type of vessel, native or graft   . Depressive disorder, not elsewhere classified   . Diaphragmatic hernia without mention of obstruction or gangrene   . Diverticulosis of colon (without mention of hemorrhage)   . Esophageal reflux   . Esophageal stricture   . Hiatal hernia   . Mitral regurgitation   . Obesity, unspecified   . Osteoarthrosis, unspecified whether generalized or localized, unspecified site   . Other and unspecified hyperlipidemia   . Parkinson's disease ([place])   . Personal history of colonic polyps   . Personal history  of unspecified circulatory disease   . Unspecified essential hypertension    Past Surgical History:  Procedure Laterality Date  . COLONOSCOPY    . CORONARY ARTERY BYPASS GRAFT     x 2  . INGUINAL HERNIA REPAIR     bilateral  . LUNG BIOPSY    . POLYPECTOMY    . PTCA      reports that he quit smoking about 45 years ago. He has never used smokeless tobacco. He reports that he does not drink alcohol or use drugs. family history includes Colon cancer in his sister. Allergies  Allergen Reactions  . Atorvastatin Other (See Comments)    Muscle cramps  . Simvastatin Other (See Comments)    Muscle cramps   Current Outpatient Medications on File Prior to Visit  Medication Sig Dispense Refill  . aspirin 81 MG tablet Take 81 mg by mouth daily.      . B Complex Vitamins (VITAMIN B COMPLEX PO) Take 1 tablet by mouth daily.      . carbidopa-levodopa (SINEMET IR) 25-100 MG tablet TAKE 1 TABLET THREE TIMES DAILY 270 tablet 1  . NITROSTAT 0.4 MG SL tablet PLACE 1 TABLET (0.4 MG TOTAL) UNDER THE TONGUE EVERY 5 (FIVE) MINUTES AS NEEDED. 25 tablet 5  . Omega-3 Fatty Acids (FISH OIL) 1000 MG CAPS Take 1-2 capsules by mouth daily.      . vitamin E 400 UNIT capsule Take 400 Units by mouth daily.       No current facility-administered medications on file  prior to visit.    Review of Systems ,Constitutional: Negative for other unusual diaphoresis, sweats, appetite or weight changes HENT: Negative for other worsening hearing loss, ear pain, facial swelling, mouth sores or neck stiffness.   Eyes: Negative for other worsening pain, redness or other visual disturbance.  Respiratory: Negative for other stridor or swelling Cardiovascular: Negative for other palpitations or other chest pain  Gastrointestinal: Negative for worsening diarrhea or loose stools, blood in stool, distention or other pain Genitourinary: Negative for hematuria, flank pain or other change in urine volume.  Musculoskeletal: Negative  for myalgias or other joint swelling.  Skin: Negative for other color change, or other wound or worsening drainage.  Neurological: Negative for other syncope or numbness. Hematological: Negative for other adenopathy or swelling Psychiatric/Behavioral: Negative for hallucinations, other worsening agitation, SI, self-injury, or new decreased concentration All other system neg per pt    Objective:   Physical Exam BP 118/74   Pulse 95   Temp 97.8 F (36.6 C) (Oral)   Ht 5\' 5"  (1.651 m)   Wt 146 lb (66.2 kg)   SpO2 97%   BMI 24.30 kg/m  VS noted,  Constitutional: Pt is oriented to person, place, and time. Appears well-developed and well-nourished, in no significant distress and comfortable Head: Normocephalic and atraumatic  Eyes: Conjunctivae and EOM are normal. Pupils are equal, round, and reactive to light Right Ear: External ear normal without discharge Left Ear: External ear normal without discharge Nose: Nose without discharge or deformity Mouth/Throat: Oropharynx is without other ulcerations and moist  Neck: Normal range of motion. Neck supple. No JVD present. No tracheal deviation present or significant neck LA or mass Cardiovascular: Normal rate, regular rhythm, normal heart sounds and intact distal pulses.   Pulmonary/Chest: WOB normal and breath sounds without rales or wheezing  Abdominal: Soft. Bowel sounds are normal. NT. No HSM  Musculoskeletal: Normal range of motion. Exhibits no edema Lymphadenopathy: Has no other cervical adenopathy.  Neurological: Pt is alert and oriented to person, place, and time. Pt has normal reflexes. No cranial nerve deficit. Motor grossly intact, Gait intact Skin: Skin is warm and dry. No rash noted or new ulcerations Psychiatric:  Has normal mood and affect. Behavior is normal without agitation No other exam findings    Assessment & Plan:

## 2017-05-06 NOTE — Patient Instructions (Signed)

## 2017-05-06 NOTE — Assessment & Plan Note (Signed)

## 2017-05-06 NOTE — Assessment & Plan Note (Signed)
stable overall by history and exam, recent data reviewed with pt, and pt to continue medical treatment as before,  to f/u any worsening symptoms or concerns BP Readings from Last 3 Encounters:  05/06/17 118/74  01/18/17 136/88  06/19/16 110/80

## 2017-05-30 NOTE — Progress Notes (Signed)
Brandon Rojas was seen today in the movement disorders clinic for neurologic consultation at the request of Biagio Borg, MD.  The consultation is for the evaluation of slow movement and to r/o PD.  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    08/19/14 update: The patient is following up today.  Last visit, the patient had signs of early Parkinson's disease, but he did not yet meet official criteria for Parkinson's disease.  He is not on any medications.  I reviewed his other records since last visit.  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  Pt states that it is still flaring intermittently.  When he isn't "hurting" he moves well.  No falls.  No tremor.  No dizziness.  Doing some walking on the treadmill but it "flares up" his piriformis syndrome.    11/23/14 update:  The patient follows up today regarding his newly diagnosed Parkinson's disease.  Last visit, I started him on carbidopa/levodopa 25/100, one tablet 3 times per day.  States that "I am doing great."  States that he is walking better.  No falls.   I also talked to him about his lisinopril.  He does have history of hypertension but he is having near syncopal episodes.  He did see cardiology last visit and I reviewed those records.  His lisinopril dosage was decreased on 09/17/2014.  No lightheadedness.  Has rare nausea and not sure if associated with the medication.  He has no wearing off of the carbidopa/levodopa 25/100.  He takes it at 8am/12pm/5pm.    03/22/15 update:  Pt f/u today for PD.    The records that were made available to me were reviewed since last visit.  Pt is on carbidopa/levodopa 25/100 tid.  He gets a little nauseated with the first pill but if he takes it with [place] it helps.  He doesn't think that it changes the efficacy of the medication at all.    Wearing off:  No.  How long before next dose:  n/a Falls:   No. Hallucinations:  No.  visual distortions:  No. Lightheaded:  No.  Syncope: No. Dyskinesia:  No.  Exercise:  Some, bikes some at home but knows that if he gets to the gym he feels better and walks better.   06/30/15 update:  The patient follows up today regarding his diagnosed Parkinson's disease.    He is on carbidopa/levodopa 25/100 tid.  No falls since last visit.  No hallucinations, lightheadedness or near syncope.  Biggest issue is back pain that radiates that radiates down the L leg.  Not been exercising due to the pain.  Had MRI lumbar spine this AM.    10/06/15 update:  The patient follows up today.  He remains on carbidopa/levodopa 25/100, one tablet 3 times per day.  He has had no falls.  No lightheadedness or near syncope.  No hallucinations.  Sleeping well.  Last visit, his biggest complaints were back pain and lumbar radiculopathy.  He was referred to ortho by his primary care physician and the patient states that he went to Dr. Oneita Kras and he was told that surgery wouldn't be indicated.  He was placed on prednisone without relief.  He was offered epidurals and didn't want that.  3 weeks ago, he started going to chiropractor in [place] and he thinks that it was helpful.  He can finally cross the leg without burning/shooting pain down the leg.  Hasn't been able to get back to vigorous CV exercise yet.  He is walking for exercise.  01/19/16 update:  Pt f/u today for PD.  The records that were made available to me were reviewed since last visit.    Still on carbidopa/levodopa 25/100 at 8:15/12:15/5:15.  Wearing off:  No.  How long before next dose:  N/a (only noted it one time and it wasn't signficiant) Falls:   No. N/V:  No. Hallucinations:  No.  visual distortions: No. Lightheaded:  No.  Syncope: No. Dyskinesia:  No.  Neuroimaging has previously been performed.  It was last done when he had a "TIA" in 2006.   06/19/16 update:  Patient is seen today in follow-up.  He is on carbidopa/levodopa 25/100 tid (8:15am/12:15pm/5:15pm).   Pt denies falls.  Pt denies lightheadedness, near syncope.  No hallucinations.  Mood has been good.  01/18/17 update: Patient is seen today for follow-up of Parkinson's disease.  Patient remains on carbidopa/levodopa 25/100, 1 tablet 3 times per day.  The patient reports that he has been doing well.  He has had no falls.  No lightheadedness or near syncope.  His mood has been good.  He hasn't been able to do much exercise, even walking.  Having L hip pain.  Using walking stick some.  Told he could have injection but he was worried about getting off plavix.  Voice getting weaker.    05/31/17 update: Patient is seen today in follow-up for Parkinson's disease.  He is on carbidopa/levodopa 25/100, 1 tablet 3 times per day.  He was referred for LSVT since our last visit.  He states that "it really helped."   He is doing PT exercises.    Pt denies falls.  Pt denies lightheadedness, near syncope.  No hallucinations.  Mood has been good.  The records that were made available to me were reviewed.  Saw PCP on 05/06/17.  Reports having some back pain with m spasm.  Will use Ultram about 3 days/week and this definitely helps without causing cognitive change.  Last visit, he was having left hip pain.  States that he still has some of this, but decided he did not want an injection because he did not want to have to go back for repeat injections in the future.  PREVIOUS MEDICATIONS: none to date  ALLERGIES:   Allergies  Allergen Reactions  . Atorvastatin Other (See Comments)    Muscle cramps  . Simvastatin Other (See Comments)    Muscle cramps    CURRENT MEDICATIONS:  Outpatient Encounter Medications as of 05/31/2017  Medication Sig  . aspirin 81 MG tablet Take 81 mg by mouth daily.    . B Complex Vitamins (VITAMIN B COMPLEX PO) Take 1 tablet by mouth daily.    . carbidopa-levodopa (SINEMET IR) 25-100 MG tablet TAKE 1 TABLET THREE TIMES DAILY  . clopidogrel (PLAVIX) 75 MG tablet TAKE 1 TABLET EVERY DAY WITH  BREAKFAST  . esomeprazole (NEXIUM) 40 MG capsule Take 1 capsule (40 mg total) by mouth daily.  [name] lovastatin (MEVACOR) 20 MG tablet Take 1 tablet (20 mg total) by mouth at bedtime.  . Omega-3 Fatty Acids (FISH OIL) 1000 MG CAPS Take 1-2 capsules by mouth daily.    . traMADol (ULTRAM) 50 MG tablet Take 1 tablet (50 mg total) by mouth every 6 (six) hours as needed. for pain  . vitamin E 400 UNIT capsule Take 400 Units by mouth daily.    [name] NITROSTAT 0.4 MG SL tablet  PLACE 1 TABLET (0.4 MG TOTAL) UNDER THE TONGUE EVERY 5 (FIVE) MINUTES AS NEEDED. (Patient not taking: Reported on 05/31/2017)   No facility-administered encounter medications on file as of 05/31/2017.     PAST MEDICAL HISTORY:   Past Medical History:  Diagnosis Date  . Anxiety state, unspecified   . Arthritis   . Benign neoplasm of colon   . Coronary atherosclerosis of unspecified type of vessel, native or graft   . Depressive disorder, not elsewhere classified   . Diaphragmatic hernia without mention of obstruction or gangrene   . Diverticulosis of colon (without mention of hemorrhage)   . Esophageal reflux   . Esophageal stricture   . Hiatal hernia   . Mitral regurgitation   . Obesity, unspecified   . Osteoarthrosis, unspecified whether generalized or localized, unspecified site   . Other and unspecified hyperlipidemia   . Parkinson's disease ([place])   . Personal history of colonic polyps   . Personal history of unspecified circulatory disease   . Unspecified essential hypertension     PAST SURGICAL HISTORY:   Past Surgical History:  Procedure Laterality Date  . COLONOSCOPY    . CORONARY ARTERY BYPASS GRAFT     x 2  . INGUINAL HERNIA REPAIR     bilateral  . LUNG BIOPSY    . POLYPECTOMY    . PTCA      SOCIAL HISTORY:   Social History   Socioeconomic History  . Marital status: Married    Spouse name: Not on file  . Number of children: 3  . Years of education: Not on file  . Highest education level: Not on  file  Occupational History  . Occupation: Mining engineer: [employer]  Social Needs  . Financial resource strain: Not on file  . Food insecurity:    Worry: Not on file    Inability: Not on file  . Transportation needs:    Medical: Not on file    Non-medical: Not on file  Tobacco Use  . Smoking status: Former Smoker    Last attempt to quit: 02/19/1972    Years since quitting: 45.3  . Smokeless tobacco: Never Used  Substance and Sexual Activity  . Alcohol use: No    Alcohol/week: 0.0 oz  . Drug use: No  . Sexual activity: Not on file  Lifestyle  . Physical activity:    Days per week: Not on file    Minutes per session: Not on file  . Stress: Not on file  Relationships  . Social connections:    Talks on phone: Not on file    Gets together: Not on file    Attends religious service: Not on file    Active member of club or organization: Not on file    Attends meetings of clubs or organizations: Not on file    Relationship status: Not on file  . Intimate partner violence:    Fear of current or ex partner: Not on file    Emotionally abused: Not on file    Physically abused: Not on file    Forced sexual activity: Not on file  Other Topics Concern  . Not on file  Social History Narrative  . Not on file    FAMILY HISTORY:   Family Status  Relation Name Status  . Mother  Deceased       MI  . Father  Deceased       staph infection, stroke  .  Sister  Alive       colon cancer  . Sister  Alive       healthy  . Sister  Alive       healthy  . Brother  Alive       Heart disease  . Brother  Alive       healthy  . Brother  Alive       healthy  . Brother  Alive       healthy  . Daughter  Alive       healthy  . Daughter  Alive       fibromyalgia  . Son  Alive       MS  . Sister  (Not Specified)    ROS:  A complete 10 system review of systems was obtained and was unremarkable apart from what is mentioned above.  PHYSICAL EXAMINATION:     VITALS:   Vitals:   05/31/17 1116  BP: 128/80  Pulse: (!) 54  SpO2: 96%  Weight: 143 lb (64.9 kg)  Height: 5\' 5"  (1.651 m)   Wt Readings from Last 3 Encounters:  05/31/17 143 lb (64.9 kg)  05/06/17 146 lb (66.2 kg)  01/18/17 144 lb (65.3 kg)     GEN:  The patient appears stated age and is in NAD. HEENT:  Normocephalic, atraumatic.  The mucous membranes are moist. The superficial temporal arteries are without ropiness or tenderness. CV:  Bradycardic with 2/6 SEM Lungs:  CTAB Neck/HEME:  There are no carotid bruits bilaterally.  Neurological examination:  Orientation: The patient is alert and oriented x3.  Cranial nerves: There is good facial symmetry. There is facial hyopomimia.   The speech is fluent and clear but he is quite hypophonic. Soft palate rises symmetrically and there is no tongue deviation. Hearing is intact to conversational tone. Sensation: Sensation is intact to light touch throughout. Motor: Strength is 5/5 in the bilateral upper and lower extremities.   Shoulder shrug is equal and symmetric.  There is no pronator drift.   Movement examination: Tone: There is no tremor.   Abnormal movements: There is no dyskinesia Coordination:  There is no decremation, with any form of RAMS, including alternating supination and pronation of the forearm, hand opening and closing, finger taps, heel taps and toe taps. Gait and Station: The patient gets OOC easily. The patient's stride length is normal and arm swing is good today.  Negative pull test.  ASSESSMENT/PLAN:  1.  Idiopathic Parkinson's disease, newly diagnosed August 19, 2014  -continues to do well on carbidopa/levodopa 25/100 tid.  Risks, benefits, side effects and alternative therapies were discussed.  The opportunity to ask questions was given and they were answered to the best of my ability.  The patient expressed understanding and willingness to follow the outlined treatment protocols.  -voice much better after  LSVT loud.  Encouraged him to continue those exercises at home, along with his physical therapy exercises. 2.  History of hypertension  -off lisinopril and doing well 3.  Low back pain with lumbar radiculopathy  -was following with orthopedics but told no surgery indicated.    -using ultram prn.  No objection at this point in him, given that he is only using it a few days per week and it is not causing any mental status change. 4.  Weight loss  -staying fairly stable now.  Encourage proper diet. 5.  Follow up is anticipated in the next few months, sooner should new neurologic issues arise.

## 2017-05-31 ENCOUNTER — Encounter: Payer: Self-pay | Admitting: Neurology

## 2017-05-31 ENCOUNTER — Ambulatory Visit: Payer: Medicare PPO | Admitting: Neurology

## 2017-05-31 VITALS — BP 128/80 | HR 54 | Ht 65.0 in | Wt 143.0 lb

## 2017-05-31 DIAGNOSIS — G20 Parkinson's disease: Secondary | ICD-10-CM

## 2017-05-31 DIAGNOSIS — M545 Low back pain, unspecified: Secondary | ICD-10-CM

## 2017-05-31 DIAGNOSIS — G8929 Other chronic pain: Secondary | ICD-10-CM | POA: Diagnosis not present

## 2017-05-31 NOTE — Patient Instructions (Signed)
Registration is OPEN!    Third Annual Parkinson's Education Symposium   To register: [email or web address]      Search:  [employer] person attending individually Questions: [place], [place] or [email or web address]    [facility] Adults with Parkinson's Thursdays 9:30-10:30 am May 2, May 9, & May 16 & Tuesdays 10:00-11:00 am June 4, June 18, & July 2 Elnora Morrison [employer], [address] [place]. To register: [phone] or email [email or web address]

## 2017-06-17 ENCOUNTER — Encounter: Payer: Self-pay | Admitting: *Deleted

## 2017-07-01 NOTE — Progress Notes (Deleted)
HPI  The patient presents for follow up of  coronary artery disease status post CABG in 2006. Last cardiac catheterization was 2012 in which he had a patent LIMA to the LAD, patent SVG to the PLA, and a small-caliber disease involving the first diagonal and first marginal that were not optimal for percutaneous intervention. He had good LV function.  I have not seen him since Sept 2016.  He returns now because he needs preop clearance for colonoscopy.  ***    Since he was last seen he has had no new symptoms. 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   The patient denies any new symptoms such as chest discomfort, neck or arm discomfort. There has been no new shortness of breath, PND or orthopnea. There have been no reported palpitations, presyncope or syncope.     Allergies  Allergen Reactions  . Atorvastatin Other (See Comments)    Muscle cramps  . Simvastatin Other (See Comments)    Muscle cramps    Current Outpatient Medications  Medication Sig Dispense Refill  . aspirin 81 MG tablet Take 81 mg by mouth daily.      . B Complex Vitamins (VITAMIN B COMPLEX PO) Take 1 tablet by mouth daily.      . carbidopa-levodopa (SINEMET IR) 25-100 MG tablet TAKE 1 TABLET THREE TIMES DAILY 270 tablet 1  . clopidogrel (PLAVIX) 75 MG tablet TAKE 1 TABLET EVERY DAY WITH BREAKFAST 90 tablet 1  . esomeprazole (NEXIUM) 40 MG capsule Take 1 capsule (40 mg total) by mouth daily. 90 capsule 3  . lovastatin (MEVACOR) 20 MG tablet Take 1 tablet (20 mg total) by mouth at bedtime. 90 tablet 0  . NITROSTAT 0.4 MG SL tablet PLACE 1 TABLET (0.4 MG TOTAL) UNDER THE TONGUE EVERY 5 (FIVE) MINUTES AS NEEDED. (Patient not taking: Reported on 05/31/2017) 25 tablet 5  . Omega-3 Fatty Acids (FISH OIL) 1000 MG CAPS Take 1-2 capsules by mouth daily.      . traMADol (ULTRAM) 50 MG tablet Take 1 tablet (50 mg total) by mouth every 6 (six) hours as needed. for pain 120 tablet 5  . vitamin E 400 UNIT capsule Take 400 Units by  mouth daily.       No current facility-administered medications for this visit.     Past Medical History:  Diagnosis Date  . Anxiety state, unspecified   . Arthritis   . Benign neoplasm of colon   . Coronary atherosclerosis of unspecified type of vessel, native or graft   . Depressive disorder, not elsewhere classified   . Diaphragmatic hernia without mention of obstruction or gangrene   . Diverticulosis of colon (without mention of hemorrhage)   . Esophageal reflux   . Esophageal stricture   . Hiatal hernia   . Mitral regurgitation   . Obesity, unspecified   . Osteoarthrosis, unspecified whether generalized or localized, unspecified site   . Other and unspecified hyperlipidemia   . Parkinson's disease ([place])   . Personal history of colonic polyps   . Personal history of unspecified circulatory disease   . Unspecified essential hypertension     Past Surgical History:  Procedure Laterality Date  . COLONOSCOPY    . CORONARY ARTERY BYPASS GRAFT     x 2  . INGUINAL HERNIA REPAIR     bilateral  . LUNG BIOPSY    . POLYPECTOMY    . PTCA      ROS:  ***  PHYSICAL EXAM There  were no vitals taken for this visit.  GENERAL:  Well appearing NECK:  No jugular venous distention, waveform within normal limits, carotid upstroke brisk and symmetric, no bruits, no thyromegaly LUNGS:  Clear to auscultation bilaterally CHEST:  Unremarkable HEART:  PMI not displaced or sustained,S1 and S2 within normal limits, no S3, no S4, no clicks, no rubs, *** murmurs ABD:  Flat, positive bowel sounds normal in frequency in pitch, no bruits, no rebound, no guarding, no midline pulsatile mass, no hepatomegaly, no splenomegaly EXT:  2 plus pulses throughout, no edema, no cyanosis no clubbing    GENERAL:  Well appearing HEENT:  Pupils equal round and reactive, fundi not visualized, oral mucosa unremarkable NECK:  No jugular venous distention, waveform within normal limits, carotid upstroke brisk and  symmetric, no bruits, no thyromegaly LUNGS:  Clear to auscultation bilaterally CHEST:  Well healed sternotomy scar. HEART:  PMI not displaced or sustained,S1 and S2 within normal limits, no S3, no S4, no clicks, no rubs, no murmurs ABD:  Flat, positive bowel sounds normal in frequency in pitch, no bruits, no rebound, no guarding, no midline pulsatile mass, no hepatomegaly, no splenomegaly EXT:  2 plus pulses throughout, no edema, no cyanosis no clubbing   EKG:  Sinus bradycardia, rate ***, axis within normal limits, intervals within normal limits, no acute ST-T wave changes. PACs.  07/01/2017  Lab Results  Component Value Date   CHOL 131 05/06/2017   TRIG 125.0 05/06/2017   HDL 56.60 05/06/2017   LDLCALC 50 05/06/2017    ASSESSMENT AND PLAN  CAD:  ***  The patient has no new sypmtoms.  No further cardiovascular testing is indicated.  We will continue with aggressive risk reduction and meds as listed.  HYPERLIPIDEMIA:  LDL is as above.  ***   in Dec was 19 with an HDL of 45. He will continue on the meds as listed.  HTN:  The blood pressure is *** at target. No change in medications is indicated. We will continue with therapeutic lifestyle changes (TLC).

## 2017-07-02 ENCOUNTER — Ambulatory Visit: Payer: Medicare PPO | Admitting: Cardiology

## 2017-07-02 NOTE — Progress Notes (Signed)
HPI  The patient presents for follow up of  coronary artery disease status post CABG in 2006. Last cardiac catheterization was 2012 in which he had a patent LIMA to the LAD, patent SVG to the PLA, and a small-caliber disease involving the first diagonal and first marginal that were not optimal for percutaneous intervention. He had good LV function.  I have not seen him since Sept 2016.  He returns now because he needs preop clearance for colonoscopy.    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  With this he denies any cardiovascular symptoms.  He does not have any of the shortness of breath that was his previous angina.  He is lost some weight and he is been limited by some Parkinson's symptoms.  This is a new diagnosis.  He is also had some lumbar back pain.  He has some rare palpitations.  He denies any presyncope or syncope.  He does not have any chest pressure, neck or arm discomfort.  Allergies  Allergen Reactions  . Atorvastatin Other (See Comments)    Muscle cramps  . Simvastatin Other (See Comments)    Muscle cramps    Current Outpatient Medications  Medication Sig Dispense Refill  . aspirin 81 MG tablet Take 81 mg by mouth daily.      . B Complex Vitamins (VITAMIN B COMPLEX PO) Take 1 tablet by mouth daily.      . carbidopa-levodopa (SINEMET IR) 25-100 MG tablet TAKE 1 TABLET THREE TIMES DAILY 270 tablet 1  . clopidogrel (PLAVIX) 75 MG tablet Take 75 mg by mouth daily.    [name] esomeprazole (NEXIUM) 40 MG capsule Take 1 capsule (40 mg total) by mouth daily. 90 capsule 3  . lovastatin (MEVACOR) 20 MG tablet Take 1 tablet (20 mg total) by mouth at bedtime. 90 tablet 0  . Omega-3 Fatty Acids (FISH OIL) 1000 MG CAPS Take 1-2 capsules by mouth daily.      . traMADol (ULTRAM) 50 MG tablet Take 1 tablet (50 mg total) by mouth every 6 (six) hours as needed. for pain 120 tablet 5  . vitamin E 400 UNIT capsule Take 400 Units by mouth daily.        [name] NITROSTAT 0.4 MG SL tablet PLACE 1 TABLET (0.4 MG TOTAL) UNDER THE TONGUE EVERY 5 (FIVE) MINUTES AS NEEDED. (Patient not taking: Reported on 05/31/2017) 25 tablet 5   No current facility-administered medications for this visit.     Past Medical History:  Diagnosis Date  . Anxiety state, unspecified   . Arthritis   . Benign neoplasm of colon   . Coronary atherosclerosis of unspecified type of vessel, native or graft   . Depressive disorder, not elsewhere classified   . Diaphragmatic hernia without mention of obstruction or gangrene   . Diverticulosis of colon (without mention of hemorrhage)   . Esophageal reflux   . Esophageal stricture   . Hiatal hernia   . Mitral regurgitation   . Osteoarthrosis, unspecified whether generalized or localized, unspecified site   . Other and unspecified hyperlipidemia   . Parkinson's disease ([place])   . Personal history of colonic polyps   . Personal history of unspecified circulatory disease   . Unspecified essential hypertension     Past Surgical History:  Procedure Laterality Date  . COLONOSCOPY    . CORONARY ARTERY BYPASS GRAFT     x 2  . INGUINAL HERNIA REPAIR  bilateral  . LUNG BIOPSY    . POLYPECTOMY    . PTCA      ROS:  Back pain.  Otherwise as stated in the HPI and negative for all other systems.  PHYSICAL EXAM BP 118/70   Pulse 62   Ht 5\' 5"  (1.651 m)   Wt 142 lb (64.4 kg)   BMI 23.63 kg/m   GENERAL:  Well appearing NECK:  No jugular venous distention, waveform within normal limits, carotid upstroke brisk and symmetric, no bruits, no thyromegaly LUNGS:  Clear to auscultation bilaterally CHEST:  Well healed sternotomy scar. HEART:  PMI not displaced or sustained,S1 and S2 within normal limits, no S3, no S4, no clicks, no rubs, 3 out of 6 apical holosystolic murmur radiating to the axilla, no diastolic murmurs ABD:  Flat, positive bowel sounds normal in frequency in pitch, no bruits, no rebound, no guarding, no midline  pulsatile mass, no hepatomegaly, no splenomegaly EXT:  2 plus pulses throughout, trace edema, no cyanosis no clubbing   EKG:  Sinus bradycardia, rate 62, axis within normal limits, intervals within normal limits, no acute ST-T wave changes. PACs.  07/03/2017  Lab Results  Component Value Date   CHOL 131 05/06/2017   TRIG 125.0 05/06/2017   HDL 56.60 05/06/2017   LDLCALC 50 05/06/2017    ASSESSMENT AND PLAN  CAD:  The patient has no new sypmtoms.  No further cardiovascular testing is indicated.  We will continue with aggressive risk reduction and meds as listed.  Of note he could hold his Plavix as needed for colonoscopy that he is planning.  Otherwise I would continue  DAPT.  This was a conscious decision made years ago the time he had untreatable coronary disease and is done very well with this.  He and I discussed the risk benefits of this approach.     HYPERLIPIDEMIA:  LDL is as above.  No change in therapy.   HTN:  The blood pressure is at target.  No change in therapy.   MR:  He needs follow up echo.  I will arrange this.

## 2017-07-03 ENCOUNTER — Encounter: Payer: Self-pay | Admitting: Cardiology

## 2017-07-03 ENCOUNTER — Ambulatory Visit: Payer: Medicare PPO | Admitting: Cardiology

## 2017-07-03 VITALS — BP 118/70 | HR 62 | Ht 65.0 in | Wt 142.0 lb

## 2017-07-03 DIAGNOSIS — I2510 Atherosclerotic heart disease of native coronary artery without angina pectoris: Secondary | ICD-10-CM

## 2017-07-03 DIAGNOSIS — E785 Hyperlipidemia, unspecified: Secondary | ICD-10-CM

## 2017-07-03 DIAGNOSIS — I340 Nonrheumatic mitral (valve) insufficiency: Secondary | ICD-10-CM | POA: Diagnosis not present

## 2017-07-03 NOTE — Patient Instructions (Signed)
Medication Instructions:  Continue current medications  If you need a refill on your cardiac medications before your next appointment, please call your pharmacy.  Labwork: None Ordered   Testing/Procedures: Your physician has requested that you have an echocardiogram. Echocardiography is a painless test that uses sound waves to create images of your heart. It provides your doctor with information about the size and shape of your heart and how well your heart's chambers and valves are working. This procedure takes approximately one hour. There are no restrictions for this procedure.  Follow-Up: Your physician wants you to follow-up in: 18 Months. You should receive a reminder letter in the mail two months in advance. If you do not receive a letter, please call our office [phone].     Thank you for choosing [facility] at [facility]!!

## 2017-07-09 ENCOUNTER — Ambulatory Visit (HOSPITAL_COMMUNITY): Payer: Medicare PPO | Attending: Cardiovascular Disease

## 2017-07-09 ENCOUNTER — Other Ambulatory Visit: Payer: Self-pay

## 2017-07-09 DIAGNOSIS — E785 Hyperlipidemia, unspecified: Secondary | ICD-10-CM | POA: Diagnosis not present

## 2017-07-09 DIAGNOSIS — I2510 Atherosclerotic heart disease of native coronary artery without angina pectoris: Secondary | ICD-10-CM | POA: Insufficient documentation

## 2017-07-09 DIAGNOSIS — I340 Nonrheumatic mitral (valve) insufficiency: Secondary | ICD-10-CM | POA: Diagnosis not present

## 2017-07-09 DIAGNOSIS — R002 Palpitations: Secondary | ICD-10-CM | POA: Insufficient documentation

## 2017-07-09 DIAGNOSIS — I110 Hypertensive heart disease with heart failure: Secondary | ICD-10-CM | POA: Diagnosis not present

## 2017-07-09 DIAGNOSIS — I509 Heart failure, unspecified: Secondary | ICD-10-CM | POA: Diagnosis not present

## 2017-07-09 DIAGNOSIS — I081 Rheumatic disorders of both mitral and tricuspid valves: Secondary | ICD-10-CM | POA: Diagnosis not present

## 2017-07-12 ENCOUNTER — Telehealth: Payer: Self-pay | Admitting: Cardiology

## 2017-07-12 NOTE — Telephone Encounter (Signed)
New message    Please call patient with echo result

## 2017-07-22 ENCOUNTER — Other Ambulatory Visit: Payer: Self-pay | Admitting: Internal Medicine

## 2017-07-22 ENCOUNTER — Other Ambulatory Visit: Payer: Self-pay | Admitting: Neurology

## 2017-11-01 ENCOUNTER — Other Ambulatory Visit: Payer: Self-pay | Admitting: Internal Medicine

## 2017-11-07 NOTE — Progress Notes (Signed)
Brandon Rojas was seen today in the movement disorders clinic for neurologic consultation at the request of Biagio Borg, MD.  The consultation is for the evaluation of slow movement and to r/o PD.  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    08/19/14 update: The patient is following up today.  Last visit, the patient had signs of early Parkinson's disease, but he did not yet meet official criteria for Parkinson's disease.  He is not on any medications.  I reviewed his other records since last visit.  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  Pt states that it is still flaring intermittently.  When he isn't "hurting" he moves well.  No falls.  No tremor.  No dizziness.  Doing some walking on the treadmill but it "flares up" his piriformis syndrome.    11/23/14 update:  The patient follows up today regarding his newly diagnosed Parkinson's disease.  Last visit, I started him on carbidopa/levodopa 25/100, one tablet 3 times per day.  States that "I am doing great."  States that he is walking better.  No falls.   I also talked to him about his lisinopril.  He does have history of hypertension but he is having near syncopal episodes.  He did see cardiology last visit and I reviewed those records.  His lisinopril dosage was decreased on 09/17/2014.  No lightheadedness.  Has rare nausea and not sure if associated with the medication.  He has no wearing off of the carbidopa/levodopa 25/100.  He takes it at 8am/12pm/5pm.    03/22/15 update:  Pt f/u today for PD.    The records that were made available to me were reviewed since last visit.  Pt is on carbidopa/levodopa 25/100 tid.  He gets a little nauseated with the first pill but if he takes it with [place] it helps.  He doesn't think that it changes the efficacy of the medication at all.    Wearing off:  No.  How long before next dose:  n/a Falls:   No. Hallucinations:  No.  visual distortions:  No. Lightheaded:  No.  Syncope: No. Dyskinesia:  No.  Exercise:  Some, bikes some at home but knows that if he gets to the gym he feels better and walks better.   06/30/15 update:  The patient follows up today regarding his diagnosed Parkinson's disease.    He is on carbidopa/levodopa 25/100 tid.  No falls since last visit.  No hallucinations, lightheadedness or near syncope.  Biggest issue is back pain that radiates that radiates down the L leg.  Not been exercising due to the pain.  Had MRI lumbar spine this AM.    10/06/15 update:  The patient follows up today.  He remains on carbidopa/levodopa 25/100, one tablet 3 times per day.  He has had no falls.  No lightheadedness or near syncope.  No hallucinations.  Sleeping well.  Last visit, his biggest complaints were back pain and lumbar radiculopathy.  He was referred to ortho by his primary care physician and the patient states that he went to Dr. Oneita Kras and he was told that surgery wouldn't be indicated.  He was placed on prednisone without relief.  He was offered epidurals and didn't want that.  3 weeks ago, he started going to chiropractor in [place] and he thinks that it was helpful.  He can finally cross the leg without burning/shooting pain down the leg.  Hasn't been able to get back to vigorous CV exercise yet.  He is walking for exercise.  01/19/16 update:  Pt f/u today for PD.  The records that were made available to me were reviewed since last visit.    Still on carbidopa/levodopa 25/100 at 8:15/12:15/5:15.  Wearing off:  No.  How long before next dose:  N/a (only noted it one time and it wasn't signficiant) Falls:   No. N/V:  No. Hallucinations:  No.  visual distortions: No. Lightheaded:  No.  Syncope: No. Dyskinesia:  No.  Neuroimaging has previously been performed.  It was last done when he had a "TIA" in 2006.   06/19/16 update:  Patient is seen today in follow-up.  He is on carbidopa/levodopa 25/100 tid (8:15am/12:15pm/5:15pm).   Pt denies falls.  Pt denies lightheadedness, near syncope.  No hallucinations.  Mood has been good.  01/18/17 update: Patient is seen today for follow-up of Parkinson's disease.  Patient remains on carbidopa/levodopa 25/100, 1 tablet 3 times per day.  The patient reports that he has been doing well.  He has had no falls.  No lightheadedness or near syncope.  His mood has been good.  He hasn't been able to do much exercise, even walking.  Having L hip pain.  Using walking stick some.  Told he could have injection but he was worried about getting off plavix.  Voice getting weaker.    05/31/17 update: Patient is seen today in follow-up for Parkinson's disease.  He is on carbidopa/levodopa 25/100, 1 tablet 3 times per day.  He was referred for LSVT since our last visit.  He states that "it really helped."   He is doing PT exercises.    Pt denies falls.  Pt denies lightheadedness, near syncope.  No hallucinations.  Mood has been good.  The records that were made available to me were reviewed.  Saw PCP on 05/06/17.  Reports having some back pain with m spasm.  Will use Ultram about 3 days/week and this definitely helps without causing cognitive change.  Last visit, he was having left hip pain.  States that he still has some of this, but decided he did not want an injection because he did not want to have to go back for repeat injections in the future.  11/08/17 update: Patient is seen today in follow-up for Parkinson's disease.  Patient remains on carbidopa/levodopa 25/100, 1 tablet 3 times per day.  He has had no falls.  He has had no hallucinations.  No lightheadedness or near syncope.  Feels well.   Patient was seen by Dr. Percival Spanish about a month after I saw the patient.  I have reviewed those records.  No medication changes were made.  PREVIOUS MEDICATIONS: none to date  ALLERGIES:   Allergies  Allergen Reactions  . Atorvastatin Other (See Comments)    Muscle cramps  . Simvastatin Other (See Comments)     Muscle cramps    CURRENT MEDICATIONS:  Outpatient Encounter Medications as of 11/08/2017  Medication Sig  . aspirin 81 MG tablet Take 81 mg by mouth daily.    . B Complex Vitamins (VITAMIN B COMPLEX PO) Take 1 tablet by mouth daily.    . carbidopa-levodopa (SINEMET IR) 25-100 MG tablet TAKE 1 TABLET THREE TIMES DAILY  . clopidogrel (PLAVIX) 75 MG tablet TAKE 1 TABLET EVERY DAY WITH BREAKFAST  . esomeprazole (NEXIUM) 40 MG capsule Take 1 capsule (40 mg total) by mouth daily.  [name] lovastatin (MEVACOR) 20 MG tablet  TAKE 1 TABLET AT BEDTIME  . NITROSTAT 0.4 MG SL tablet PLACE 1 TABLET (0.4 MG TOTAL) UNDER THE TONGUE EVERY 5 (FIVE) MINUTES AS NEEDED.  [name] Omega-3 Fatty Acids (FISH OIL) 1000 MG CAPS Take 1-2 capsules by mouth daily.    . traMADol (ULTRAM) 50 MG tablet Take 1 tablet (50 mg total) by mouth every 6 (six) hours as needed. for pain  . vitamin E 400 UNIT capsule Take 400 Units by mouth daily.     No facility-administered encounter medications on file as of 11/08/2017.     PAST MEDICAL HISTORY:   Past Medical History:  Diagnosis Date  . Anxiety state, unspecified   . Arthritis   . Benign neoplasm of colon   . Coronary atherosclerosis of unspecified type of vessel, native or graft   . Depressive disorder, not elsewhere classified   . Diaphragmatic hernia without mention of obstruction or gangrene   . Diverticulosis of colon (without mention of hemorrhage)   . Esophageal reflux   . Esophageal stricture   . Hiatal hernia   . Mitral regurgitation   . Osteoarthrosis, unspecified whether generalized or localized, unspecified site   . Other and unspecified hyperlipidemia   . Parkinson's disease ([place])   . Personal history of colonic polyps   . Personal history of unspecified circulatory disease   . Unspecified essential hypertension     PAST SURGICAL HISTORY:   Past Surgical History:  Procedure Laterality Date  . COLONOSCOPY    . CORONARY ARTERY BYPASS GRAFT     x 2  . INGUINAL  HERNIA REPAIR     bilateral  . LUNG BIOPSY    . POLYPECTOMY    . PTCA      SOCIAL HISTORY:   Social History   Socioeconomic History  . Marital status: Married    Spouse name: Not on file  . Number of children: 3  . Years of education: Not on file  . Highest education level: Not on file  Occupational History  . Occupation: Mining engineer: [employer]  Social Needs  . Financial resource strain: Not on file  . Food insecurity:    Worry: Not on file    Inability: Not on file  . Transportation needs:    Medical: Not on file    Non-medical: Not on file  Tobacco Use  . Smoking status: Former Smoker    Last attempt to quit: 02/19/1972    Years since quitting: 45.7  . Smokeless tobacco: Never Used  Substance and Sexual Activity  . Alcohol use: No    Alcohol/week: 0.0 standard drinks  . Drug use: No  . Sexual activity: Not on file  Lifestyle  . Physical activity:    Days per week: Not on file    Minutes per session: Not on file  . Stress: Not on file  Relationships  . Social connections:    Talks on phone: Not on file    Gets together: Not on file    Attends religious service: Not on file    Active member of club or organization: Not on file    Attends meetings of clubs or organizations: Not on file    Relationship status: Not on file  . Intimate partner violence:    Fear of current or ex partner: Not on file    Emotionally abused: Not on file    Physically abused: Not on file    Forced sexual activity: Not on file  Other Topics Concern  . Not on file  Social History Narrative  . Not on file    FAMILY HISTORY:   Family Status  Relation Name Status  . Mother  Deceased       MI  . Father  Deceased       staph infection, stroke  . Sister  Alive       colon cancer  . Sister  Alive       healthy  . Brother  Alive       Heart disease  . Brother  Alive       healthy  . Brother  Alive       healthy  . Brother  Alive       healthy  .  Daughter  Alive       healthy  . Daughter  Alive       fibromyalgia  . Son  Alive       MS    ROS: Review of Systems  Constitutional: Negative.   HENT: Negative.   Eyes: Negative.   Respiratory: Negative.   Cardiovascular: Negative.   Gastrointestinal: Negative.   Genitourinary: Negative.   Musculoskeletal: Negative.   Skin: Negative.     PHYSICAL EXAMINATION:    VITALS:   Vitals:   11/08/17 1108  BP: 112/68  Pulse: 60  SpO2: 96%  Weight: 144 lb (65.3 kg)  Height: 5\' 5"  (1.651 m)   Wt Readings from Last 3 Encounters:  11/08/17 144 lb (65.3 kg)  07/03/17 142 lb (64.4 kg)  05/31/17 143 lb (64.9 kg)     GEN:  The patient appears stated age and is in NAD. HEENT:  Normocephalic, atraumatic.  The mucous membranes are moist. The superficial temporal arteries are without ropiness or tenderness. CV:  RRR Lungs:  CTAB Neck/HEME:  There are no carotid bruits bilaterally.  Neurological examination:  Orientation: The patient is alert and oriented x3. Cranial nerves: There is good facial symmetry. The speech is fluent and clear. Soft palate rises symmetrically and there is no tongue deviation. Hearing is intact to conversational tone. Sensation: Sensation is intact to light touch throughout Motor: Strength is 5/5 in the bilateral upper and lower extremities.   Shoulder shrug is equal and symmetric.  There is no pronator drift.  Movement examination: Tone: There is no tremor.   Abnormal movements: There is no dyskinesia Coordination:  There is no decremation, with any form of RAMS, including alternating supination and pronation of the forearm, hand opening and closing, finger taps, heel taps and toe taps. Gait and Station: The patient gets OOC easily. The patient's stride length is normal and arm swing is good today.    ASSESSMENT/PLAN:  1.  Idiopathic Parkinson's disease, newly diagnosed August 19, 2014  -continues to do well on carbidopa/levodopa 25/100 tid.  Risks,  benefits, side effects and alternative therapies were discussed.  The opportunity to ask questions was given and they were answered to the best of my ability.  The patient expressed understanding and willingness to follow the outlined treatment protocols.  -voice much better after LSVT loud.  Encouraged him to continue those exercises at home, along with his physical therapy exercises. 2.  History of hypertension  -off lisinopril and doing well 3.  Low back pain with lumbar radiculopathy  -was following with orthopedics but told no surgery indicated.    -using ultram prn.  No objection at this point in him, given that he is only using it a  few days per week and it is not causing any mental status change. 4.  Weight loss  -staying fairly stable now.  Encourage proper diet. 5.  F/u 6-8 months

## 2017-11-08 ENCOUNTER — Ambulatory Visit: Payer: Medicare PPO | Admitting: Neurology

## 2017-11-08 ENCOUNTER — Encounter: Payer: Self-pay | Admitting: Neurology

## 2017-11-08 VITALS — BP 112/68 | HR 60 | Ht 65.0 in | Wt 144.0 lb

## 2017-11-08 DIAGNOSIS — G20 Parkinson's disease: Secondary | ICD-10-CM | POA: Diagnosis not present

## 2017-11-08 NOTE — Patient Instructions (Signed)
Support & Resources  You are not alone. Being diagnosed with Parkinson's disease can be an emotional diagnosis, and one that impacts your whole family. Our goal is to make sure you're getting support not just while you're at , but in the days between appointments. Our social worker will work with both patients and their support systems to help you cope with the changes your diagnosis brings to your everyday life. You will learn how to recognize and adjust to new needs, receive information about coping skills related to disease progression, receive counseling and be plugged into community resources and other areas of support. You can contact our social worker, Jessica S. Thomas, for resources and support at [phone].   [facility]'s Community Support Group This group meets every third Tuesday from 4:00 p.m. - 5:00 p.m. at the [facility]. This group provides information about how Parkinson's disease affects you as an individual, how to proactively take control of Parkinson's disease, and steps to manage your Parkinson's disease, education, and information about exercise for lifelong activity.  Other Local Support Groups: (please call group leader to confirm meeting location) City Monthly Meeting Day/Time Location Group Leader  [place]  1st Tuesday  (no June/July mtg) 10:30 am                                                                  [address], [place], [place] [address] Annette Caughron                            [phone]                                    [email or web address]         [place]  2nd Tuesday   10:30 am  [address]      [place], [place] [address] Bill Amidon                                         [phone]                         [email or web address]   [place]        [place]) 1st Thursday     10:30 am [address] , [place] [address] Katie Page                                          [phone]   - early onset  Varies  [facility]  Tim Hudson                                      [email or web address]  [place]  3rd Monday     2pm [address], [place], [place] [address] Jessica Thomas, LCSW                        [email or web address]  [place] 2nd Thursday          10am [address] [place], [place] [address] Kathy Coolidge                      [phone]                       [email or web address]       [place]  3rd Wednesday      7pm  [address] [place], [place] [address] [phone]          

## 2017-11-12 ENCOUNTER — Encounter: Payer: Self-pay | Admitting: Internal Medicine

## 2017-11-12 ENCOUNTER — Ambulatory Visit: Payer: Medicare PPO | Admitting: Internal Medicine

## 2017-11-12 VITALS — BP 116/72 | HR 54 | Temp 98.6°F | Ht 65.0 in | Wt 144.0 lb

## 2017-11-12 DIAGNOSIS — M545 Low back pain, unspecified: Secondary | ICD-10-CM

## 2017-11-12 DIAGNOSIS — Z23 Encounter for immunization: Secondary | ICD-10-CM | POA: Diagnosis not present

## 2017-11-12 DIAGNOSIS — G8929 Other chronic pain: Secondary | ICD-10-CM | POA: Diagnosis not present

## 2017-11-12 DIAGNOSIS — E785 Hyperlipidemia, unspecified: Secondary | ICD-10-CM | POA: Diagnosis not present

## 2017-11-12 DIAGNOSIS — I10 Essential (primary) hypertension: Secondary | ICD-10-CM

## 2017-11-12 DIAGNOSIS — Z0000 Encounter for general adult medical examination without abnormal findings: Secondary | ICD-10-CM

## 2017-11-12 MED ORDER — TIZANIDINE HCL 2 MG PO TABS: 2.0000 mg | ORAL_TABLET | Freq: Four times a day (QID) | ORAL | 1 refills | Status: DC | PRN

## 2017-11-12 NOTE — Progress Notes (Signed)
Subjective:    Patient ID: Brandon Rojas, male    DOB: 12-Jan-1945, 72 y.o.   MRN: [id number]  HPI  Here to f/u; overall doing ok,  Pt denies chest pain, increasing sob or doe, wheezing, orthopnea, PND, increased LE swelling, palpitations, dizziness or syncope.  Pt denies new neurological symptoms such as new headache, or facial or extremity weakness or numbness.  Pt denies polydipsia, polyuria, or low sugar episode.  Pt states overall good compliance with meds, mostly trying to follow appropriate diet, with wt overall stable,  but little exercise however.  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  Pt continues to have recurring LBP without change in severity, bowel or bladder change, fever, wt loss,  worsening LE pain/numbness/weakness, gait change or falls. Past Medical History:  Diagnosis Date  . Anxiety state, unspecified   . Arthritis   . Benign neoplasm of colon   . Coronary atherosclerosis of unspecified type of vessel, native or graft   . Depressive disorder, not elsewhere classified   . Diaphragmatic hernia without mention of obstruction or gangrene   . Diverticulosis of colon (without mention of hemorrhage)   . Esophageal reflux   . Esophageal stricture   . Hiatal hernia   . Mitral regurgitation   . Osteoarthrosis, unspecified whether generalized or localized, unspecified site   . Other and unspecified hyperlipidemia   . Parkinson's disease ([place])   . Personal history of colonic polyps   . Personal history of unspecified circulatory disease   . Unspecified essential hypertension    Past Surgical History:  Procedure Laterality Date  . COLONOSCOPY    . CORONARY ARTERY BYPASS GRAFT     x 2  . INGUINAL HERNIA REPAIR     bilateral  . LUNG BIOPSY    . POLYPECTOMY    . PTCA      reports that he quit smoking about 45 years ago. He has never used smokeless tobacco. He reports that he does not drink alcohol or use drugs. family history includes Colon cancer in his  sister; Fibromyalgia in his daughter; Healthy in his brother, brother, brother, daughter, and sister; Heart attack in his mother; Heart disease in his brother; Multiple sclerosis in his son; Stroke in his father. Allergies  Allergen Reactions  . Atorvastatin Other (See Comments)    Muscle cramps  . Simvastatin Other (See Comments)    Muscle cramps   Current Outpatient Medications on File Prior to Visit  Medication Sig Dispense Refill  . aspirin 81 MG tablet Take 81 mg by mouth daily.      . B Complex Vitamins (VITAMIN B COMPLEX PO) Take 1 tablet by mouth daily.      . carbidopa-levodopa (SINEMET IR) 25-100 MG tablet TAKE 1 TABLET THREE TIMES DAILY 270 tablet 1  . clopidogrel (PLAVIX) 75 MG tablet TAKE 1 TABLET EVERY DAY WITH BREAKFAST 90 tablet 1  . esomeprazole (NEXIUM) 40 MG capsule Take 1 capsule (40 mg total) by mouth daily. 90 capsule 3  . lovastatin (MEVACOR) 20 MG tablet TAKE 1 TABLET AT BEDTIME 90 tablet 2  . NITROSTAT 0.4 MG SL tablet PLACE 1 TABLET (0.4 MG TOTAL) UNDER THE TONGUE EVERY 5 (FIVE) MINUTES AS NEEDED. 25 tablet 5  . Omega-3 Fatty Acids (FISH OIL) 1000 MG CAPS Take 1-2 capsules by mouth daily.      . traMADol (ULTRAM) 50 MG tablet Take 1 tablet (50 mg total) by mouth every 6 (six) hours as needed. for  pain 120 tablet 5  . vitamin E 400 UNIT capsule Take 400 Units by mouth daily.       No current facility-administered medications on file prior to visit.    Review of Systems  Constitutional: Negative for other unusual diaphoresis or sweats HENT: Negative for ear discharge or swelling Eyes: Negative for other worsening visual disturbances Respiratory: Negative for stridor or other swelling  Gastrointestinal: Negative for worsening distension or other blood Genitourinary: Negative for retention or other urinary change Musculoskeletal: Negative for other MSK pain or swelling Skin: Negative for color change or other new lesions Neurological: Negative for worsening  tremors and other numbness  Psychiatric/Behavioral: Negative for worsening agitation or other fatigue All other system neg per pt    Objective:   Physical Exam BP 116/72   Pulse (!) 54   Temp 98.6 F (37 C) (Oral)   Ht 5\' 5"  (1.651 m)   Wt 144 lb (65.3 kg)   SpO2 96%   BMI 23.96 kg/m  VS noted,  Constitutional: Pt appears in NAD HENT: Head: NCAT.  Right Ear: External ear normal.  Left Ear: External ear normal.  Eyes: . Pupils are equal, round, and reactive to light. Conjunctivae and EOM are normal Nose: without d/c or deformity Neck: Neck supple. Gross normal ROM Cardiovascular: Normal rate and regular rhythm.   Pulmonary/Chest: Effort normal and breath sounds without rales or wheezing.  Abd:  Soft, NT, ND, + BS, no organomegaly Spine with lower scoliosis + tender left lumbar para vertebral tender Neurological: Pt is alert. At baseline orientation, motor grossly intact Skin: Skin is warm. No rashes, other new lesions, no LE edema Psychiatric: Pt behavior is normal without agitation  No other exam findings Lab Results  Component Value Date   WBC 7.0 05/06/2017   HGB 14.1 05/06/2017   HCT 41.9 05/06/2017   PLT 235.0 05/06/2017   GLUCOSE 99 05/06/2017   CHOL 131 05/06/2017   TRIG 125.0 05/06/2017   HDL 56.60 05/06/2017   LDLCALC 50 05/06/2017   ALT 5 05/06/2017   AST 14 05/06/2017   NA 142 05/06/2017   K 4.0 05/06/2017   CL 105 05/06/2017   CREATININE 1.13 05/06/2017   BUN 13 05/06/2017   CO2 32 05/06/2017   TSH 2.03 05/06/2017   PSA 1.68 05/06/2017   INR 1.0 03/06/2010       Assessment & Plan:

## 2017-11-12 NOTE — Patient Instructions (Addendum)
You had the flu shot today  Please take all new medication as prescribed  - the muscle relaxer as needed (sent to [facility])  Please continue all other medications as before, and refills have been done if requested.  Please have the pharmacy call with any other refills you may need.  Please continue your efforts at being more active, low cholesterol diet, and weight control.  Please keep your appointments with your specialists as you may have planned  Please return in 6 months, or sooner if needed, with Lab testing done 3-5 days before

## 2017-11-15 ENCOUNTER — Encounter: Payer: Self-pay | Admitting: Internal Medicine

## 2017-11-15 NOTE — Assessment & Plan Note (Signed)
With msk spasm, for tizanidine prn,  to f/u any worsening symptoms or concerns

## 2017-11-15 NOTE — Assessment & Plan Note (Signed)
stable overall by history and exam, recent data reviewed with pt, and pt to continue medical treatment as before,  to f/u any worsening symptoms or concerns  

## 2018-01-21 ENCOUNTER — Other Ambulatory Visit: Payer: Self-pay | Admitting: Neurology

## 2018-03-12 ENCOUNTER — Telehealth: Payer: Self-pay | Admitting: Neurology

## 2018-03-12 NOTE — Telephone Encounter (Signed)
Pharmacy called regarding this patient and will be faxing over a form to complete. A new order is needed for the Myo Injectables. Thanks

## 2018-03-12 NOTE — Telephone Encounter (Signed)
We do not do injections on this patient. Are you sure this isn't for his son who sees Dr. Posey Pronto?

## 2018-03-13 NOTE — Telephone Encounter (Signed)
Error Please Disregard Previous note. Thank you

## 2018-04-29 ENCOUNTER — Other Ambulatory Visit: Payer: Self-pay | Admitting: Internal Medicine

## 2018-05-05 ENCOUNTER — Encounter: Payer: Self-pay | Admitting: *Deleted

## 2018-05-05 NOTE — Progress Notes (Signed)
Virtual Visit via Video Note The purpose of this virtual visit is to provide medical care while limiting exposure to the novel coronavirus.    Consent was obtained for video visit:  Yes.   Answered questions that patient had about telehealth interaction:  Yes.   I discussed the limitations, risks, security and privacy concerns of performing an evaluation and management service by telemedicine. I also discussed with the patient that there may be a patient responsible charge related to this service. The patient expressed understanding and agreed to proceed.  Pt location: Home Physician Location: office Name of referring provider:  Biagio Borg, MD I connected with [name] at patients initiation/request on 05/06/2018 at  2:00 PM EDT by video enabled telemedicine application and verified that I am speaking with the correct person using two identifiers. Pt MRN:  [id number] Pt DOB:  06-16-1945 Video Participants:  [name];  wife   History of Present Illness:  Patient seen today in follow-up for Parkinson's disease.  Patient is on carbidopa/levodopa 25/100, 1 tablet 3 times per day.  He has had no falls since last visit.  No lightheadedness or near syncope.  No hallucinations.  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  Medical records are reviewed.  Last saw his primary care physician on November 5.   Observations/Objective:   Vitals:   05/06/18 0908 05/06/18 1407  BP: 115/67 (!) 147/88  Weight: 141 lb (64 kg)   Height: 5\' 5"  (1.651 m)    GEN:  The patient appears stated age and is in NAD.  Neurological examination:  Orientation: The patient is alert and oriented x3. Cranial nerves: There is good facial symmetry. There is facial hypomimia.  The speech is fluent and clear. Soft palate rises symmetrically and there is no tongue deviation. Hearing is intact to conversational tone. Motor: Strength is at least antigravity x 4.    Shoulder shrug is equal and symmetric.  There is no pronator drift.  Movement examination: Tone: unable Abnormal movements: none Coordination:  There is decremation with RAM's, with all forms of rapid alternating movements in the upper extremities bilaterally.  Was unable to see the lower extremities adequately to assess this. Gait and Station: The patient pushes off of his kitchen table to arise.  He walks well in his kitchen.      Assessment and Plan:   1.  Idiopathic Parkinson's disease, newly diagnosed August 19, 2014             -continues to do well on carbidopa/levodopa 25/100 tid.    He does have decremation today with rapid alternating movements, but is able to move very well in his home and we decided not to change the dosing of his medication.  I did refill his medicine today.             -Encouraged him to do exercises safely while at home during the pandemic. 2.  History of hypertension             -off lisinopril and doing well 3.  Low back pain with lumbar radiculopathy             -was following with orthopedics but told no surgery indicated.               -using ultram prn.  No objection at this point in him, given that he is only using it a  few days per week and it is not causing any mental status change.  Wife indicates that blood pressure up a little bit today because of the back pain.  Follow Up Instructions:  Follow-up in 4 to 5 months  -I discussed the assessment and treatment plan with the patient. The patient was provided an opportunity to ask questions and all were answered. The patient agreed with the plan and demonstrated an understanding of the instructions.   The patient was advised to call back or seek an in-person evaluation if the symptoms worsen or if the condition fails to improve as anticipated.    Total Time spent in visit with the patient was: 15 minutes, of which more than 50% of the time was spent in counseling and/or coordinating care on safety  with Parkinson's disease and COVID.   Pt understands and agrees with the plan of care outlined.     Alonza Bogus, DO

## 2018-05-06 ENCOUNTER — Encounter: Payer: Self-pay | Admitting: *Deleted

## 2018-05-06 ENCOUNTER — Encounter: Payer: Self-pay | Admitting: Neurology

## 2018-05-06 ENCOUNTER — Other Ambulatory Visit: Payer: Self-pay

## 2018-05-06 ENCOUNTER — Telehealth (INDEPENDENT_AMBULATORY_CARE_PROVIDER_SITE_OTHER): Payer: Medicare PPO | Admitting: Neurology

## 2018-05-06 DIAGNOSIS — G20 Parkinson's disease: Secondary | ICD-10-CM | POA: Diagnosis not present

## 2018-05-06 MED ORDER — CARBIDOPA-LEVODOPA 25-100 MG PO TABS: 1.0000 | ORAL_TABLET | Freq: Three times a day (TID) | ORAL | 1 refills | Status: DC

## 2018-05-13 ENCOUNTER — Ambulatory Visit: Payer: Medicare PPO | Admitting: Internal Medicine

## 2018-05-13 ENCOUNTER — Encounter: Payer: Self-pay | Admitting: Internal Medicine

## 2018-05-13 ENCOUNTER — Ambulatory Visit (INDEPENDENT_AMBULATORY_CARE_PROVIDER_SITE_OTHER): Payer: Medicare PPO | Admitting: Internal Medicine

## 2018-05-13 ENCOUNTER — Ambulatory Visit: Payer: Medicare PPO | Admitting: Neurology

## 2018-05-13 DIAGNOSIS — Z0000 Encounter for general adult medical examination without abnormal findings: Secondary | ICD-10-CM | POA: Diagnosis not present

## 2018-05-13 MED ORDER — TRAMADOL HCL 50 MG PO TABS: 50.0000 mg | ORAL_TABLET | Freq: Four times a day (QID) | ORAL | 5 refills | Status: DC | PRN

## 2018-05-13 NOTE — Progress Notes (Signed)
Patient ID: Brandon Rojas, male   DOB: 1945-07-02, 73 y.o.   MRN: [id number]  Virtual Visit via Video Note  I connected with Brandon Rojas on 05/13/18 at  8:40 AM EDT by a video enabled telemedicine application and verified that I am speaking with the correct person using two identifiers.  Location: Patient: at home Provider: at office   I discussed the limitations of evaluation and management by telemedicine and the availability of in person appointments. The patient expressed understanding and agreed to proceed.  History of Present Illness: Here for wellness and f/u;  Overall doing ok;  Pt denies Chest pain, worsening SOB, DOE, wheezing, orthopnea, PND, worsening LE edema, palpitations, dizziness or syncope.  Pt denies neurological change such as new headache, facial or extremity weakness.  Pt denies polydipsia, polyuria, or low sugar symptoms. Pt states overall good compliance with treatment and medications, good tolerability, and has been trying to follow appropriate diet.  Pt denies worsening depressive symptoms, suicidal ideation or panic. No fever, night sweats, wt loss, loss of appetite, or other constitutional symptoms.  Pt states good ability with ADL's, has moderate fall risk, home safety reviewed and adequate, no other significant changes in hearing or vision, and only occasionally active with exercise.  Pt continues to have recurring LBP without change in severity, bowel or bladder change, fever, wt loss,  worsening LE pain/numbness/weakness, gait change or falls. Sees neurology regularly.   BP 141/78 at home, current home wt 141.  Due for colonoscopy with off plavix x 5 days prior with Dr Brandon Rojas.  No new complaints  Wt Readings from Last 3 Encounters:  05/06/18 141 lb (64 kg)  11/12/17 144 lb (65.3 kg)  11/08/17 144 lb (65.3 kg)   BP Readings from Last 3 Encounters:  05/06/18 (!) 147/88  11/12/17 116/72  11/08/17 112/68   Past Medical History:  Diagnosis Date  . Anxiety state,  unspecified   . Arthritis   . Benign neoplasm of colon   . Coronary atherosclerosis of unspecified type of vessel, native or graft   . Depressive disorder, not elsewhere classified   . Diaphragmatic hernia without mention of obstruction or gangrene   . Diverticulosis of colon (without mention of hemorrhage)   . Esophageal reflux   . Esophageal stricture   . Hiatal hernia   . Mitral regurgitation   . Osteoarthrosis, unspecified whether generalized or localized, unspecified site   . Other and unspecified hyperlipidemia   . Parkinson's disease ([place])   . Personal history of colonic polyps   . Personal history of unspecified circulatory disease   . Unspecified essential hypertension    Past Surgical History:  Procedure Laterality Date  . COLONOSCOPY    . CORONARY ARTERY BYPASS GRAFT     x 2  . INGUINAL HERNIA REPAIR     bilateral  . LUNG BIOPSY    . POLYPECTOMY    . PTCA      reports that he quit smoking about 46 years ago. He has never used smokeless tobacco. He reports that he does not drink alcohol or use drugs. family history includes Colon cancer in his sister; Fibromyalgia in his daughter; Healthy in his brother, brother, brother, daughter, and sister; Heart attack in his mother; Heart disease in his brother; Multiple sclerosis in his son; Stroke in his father. Allergies  Allergen Reactions  . Atorvastatin Other (See Comments)    Muscle cramps  . Simvastatin Other (See Comments)    Muscle cramps  Current Outpatient Medications on File Prior to Visit  Medication Sig Dispense Refill  . aspirin 81 MG tablet Take 81 mg by mouth daily.      . B Complex Vitamins (VITAMIN B COMPLEX PO) Take 1 tablet by mouth daily.      . carbidopa-levodopa (SINEMET IR) 25-100 MG tablet Take 1 tablet by mouth 3 (three) times daily. 270 tablet 1  . clopidogrel (PLAVIX) 75 MG tablet TAKE 1 TABLET EVERY DAY WITH BREAKFAST 90 tablet 1  . esomeprazole (NEXIUM) 40 MG capsule Take 1 capsule (40 mg  total) by mouth daily. 90 capsule 3  . lovastatin (MEVACOR) 20 MG tablet TAKE 1 TABLET AT BEDTIME 90 tablet 1  . NITROSTAT 0.4 MG SL tablet PLACE 1 TABLET (0.4 MG TOTAL) UNDER THE TONGUE EVERY 5 (FIVE) MINUTES AS NEEDED. 25 tablet 5  . Omega-3 Fatty Acids (FISH OIL) 1000 MG CAPS Take 1-2 capsules by mouth daily.      [name] tiZANidine (ZANAFLEX) 2 MG tablet Take 1 tablet (2 mg total) by mouth every 6 (six) hours as needed for muscle spasms. 40 tablet 1  . vitamin E 400 UNIT capsule Take 400 Units by mouth daily.       No current facility-administered medications on file prior to visit.    Observations/Objective: Alert, NAD, appropriate mood and affect, resps normal, cn 2-12 intact, moves all 4s, no visible rash or swelling Lab Results  Component Value Date   WBC 7.0 05/06/2017   HGB 14.1 05/06/2017   HCT 41.9 05/06/2017   PLT 235.0 05/06/2017   GLUCOSE 99 05/06/2017   CHOL 131 05/06/2017   TRIG 125.0 05/06/2017   HDL 56.60 05/06/2017   LDLCALC 50 05/06/2017   ALT 5 05/06/2017   AST 14 05/06/2017   NA 142 05/06/2017   K 4.0 05/06/2017   CL 105 05/06/2017   CREATININE 1.13 05/06/2017   BUN 13 05/06/2017   CO2 32 05/06/2017   TSH 2.03 05/06/2017   PSA 1.68 05/06/2017   INR 1.0 03/06/2010   Assessment and Plan: See notes  Follow Up Instructions: See notes   I discussed the assessment and treatment plan with the patient. The patient was provided an opportunity to ask questions and all were answered. The patient agreed with the plan and demonstrated an understanding of the instructions.   The patient was advised to call back or seek an in-person evaluation if the symptoms worsen or if the condition fails to improve as anticipated.   [name], MD

## 2018-05-13 NOTE — Patient Instructions (Signed)
Please continue all other medications as before, and refills have been done if requested.  Please have the pharmacy call with any other refills you may need.  Please continue your efforts at being more active, low cholesterol diet, and weight control.  You are otherwise up to date with prevention measures today.  Please keep your appointments with your specialists as you may have planned  You will be contacted regarding the referral for: colonoscopy  Please go to the LAB in the Basement (turn left off the elevator) for the tests to be done at your convenience  You will be contacted by phone if any changes need to be made immediately.  Otherwise, you will receive a letter about your results with an explanation, but please check with MyChart first.  Please remember to sign up for MyChart if you have not done so, as this will be important to you in the future with finding out test results, communicating by private email, and scheduling acute appointments online when needed.  Please return in 6 months, or sooner if needed

## 2018-05-13 NOTE — Assessment & Plan Note (Signed)

## 2018-05-15 ENCOUNTER — Other Ambulatory Visit (INDEPENDENT_AMBULATORY_CARE_PROVIDER_SITE_OTHER): Payer: Medicare PPO

## 2018-05-15 DIAGNOSIS — Z0000 Encounter for general adult medical examination without abnormal findings: Secondary | ICD-10-CM

## 2018-05-15 DIAGNOSIS — E785 Hyperlipidemia, unspecified: Secondary | ICD-10-CM

## 2018-05-15 LAB — HEPATIC FUNCTION PANEL
ALT: 4 U/L (ref 0–53)
AST: 22 U/L (ref 0–37)
Albumin: 4.1 g/dL (ref 3.5–5.2)
Alkaline Phosphatase: 65 U/L (ref 39–117)
Bilirubin, Direct: 0.1 mg/dL (ref 0.0–0.3)
Total Bilirubin: 0.6 mg/dL (ref 0.2–1.2)
Total Protein: 6.6 g/dL (ref 6.0–8.3)

## 2018-05-15 LAB — LIPID PANEL
Cholesterol: 149 mg/dL (ref 0–200)
HDL: 50.1 mg/dL (ref >39.00)
LDL Cholesterol: 70 mg/dL (ref 0–99)
NonHDL: 99.27
Total CHOL/HDL Ratio: 3
Triglycerides: 145 mg/dL (ref 0.0–149.0)
VLDL: 29 mg/dL (ref 0.0–40.0)

## 2018-05-15 LAB — CBC WITH DIFFERENTIAL/PLATELET
Basophils Absolute: 0.1 10*3/uL (ref 0.0–0.1)
Basophils Relative: 0.8 % (ref 0.0–3.0)
Eosinophils Absolute: 0.5 10*3/uL (ref 0.0–0.7)
Eosinophils Relative: 6.1 % — ABNORMAL HIGH (ref 0.0–5.0)
HCT: 45 % (ref 39.0–52.0)
Hemoglobin: 15.3 g/dL (ref 13.0–17.0)
Lymphocytes Relative: 18.2 % (ref 12.0–46.0)
Lymphs Abs: 1.6 10*3/uL (ref 0.7–4.0)
MCHC: 34.1 g/dL (ref 30.0–36.0)
MCV: 91.1 fl (ref 78.0–100.0)
Monocytes Absolute: 0.7 10*3/uL (ref 0.1–1.0)
Monocytes Relative: 7.6 % (ref 3.0–12.0)
Neutro Abs: 5.9 10*3/uL (ref 1.4–7.7)
Neutrophils Relative %: 67.3 % (ref 43.0–77.0)
Platelets: 230 10*3/uL (ref 150.0–400.0)
RBC: 4.94 Mil/uL (ref 4.22–5.81)
RDW: 14.7 % (ref 11.5–15.5)
WBC: 8.8 10*3/uL (ref 4.0–10.5)

## 2018-05-15 LAB — URINALYSIS, ROUTINE W REFLEX MICROSCOPIC
Bilirubin Urine: NEGATIVE
Hgb urine dipstick: NEGATIVE
Leukocytes,Ua: NEGATIVE
Nitrite: NEGATIVE
RBC / HPF: NONE SEEN (ref 0–?)
Specific Gravity, Urine: 1.02 (ref 1.000–1.030)
Total Protein, Urine: NEGATIVE
Urine Glucose: NEGATIVE
Urobilinogen, UA: 0.2 (ref 0.0–1.0)
pH: 5.5 (ref 5.0–8.0)

## 2018-05-15 LAB — BASIC METABOLIC PANEL
BUN: 17 mg/dL (ref 6–23)
CO2: 28 mEq/L (ref 19–32)
Calcium: 9.5 mg/dL (ref 8.4–10.5)
Chloride: 104 mEq/L (ref 96–112)
Creatinine, Ser: 1.07 mg/dL (ref 0.40–1.50)
GFR: 67.56 mL/min (ref >60.00)
Glucose, Bld: 89 mg/dL (ref 70–99)
Potassium: 4.6 mEq/L (ref 3.5–5.1)
Sodium: 139 mEq/L (ref 135–145)

## 2018-05-15 LAB — TSH: TSH: 2.51 u[IU]/mL (ref 0.35–4.50)

## 2018-05-15 LAB — PSA: PSA: 1.04 ng/mL (ref 0.10–4.00)

## 2018-05-20 ENCOUNTER — Other Ambulatory Visit: Payer: Self-pay

## 2018-05-20 ENCOUNTER — Inpatient Hospital Stay (HOSPITAL_COMMUNITY)
Admission: EM | Admit: 2018-05-20 | Discharge: 2018-05-24 | DRG: 444 | Disposition: A | Discharge status: Home Health | Payer: Medicare PPO | Attending: Internal Medicine | Admitting: Internal Medicine

## 2018-05-20 ENCOUNTER — Emergency Department (HOSPITAL_COMMUNITY): Payer: Medicare PPO

## 2018-05-20 ENCOUNTER — Encounter (HOSPITAL_COMMUNITY): Payer: Self-pay | Admitting: Emergency Medicine

## 2018-05-20 DIAGNOSIS — Z87891 Personal history of nicotine dependence: Secondary | ICD-10-CM

## 2018-05-20 DIAGNOSIS — R1013 Epigastric pain: Secondary | ICD-10-CM | POA: Diagnosis present

## 2018-05-20 DIAGNOSIS — K219 Gastro-esophageal reflux disease without esophagitis: Secondary | ICD-10-CM | POA: Diagnosis not present

## 2018-05-20 DIAGNOSIS — I340 Nonrheumatic mitral (valve) insufficiency: Secondary | ICD-10-CM | POA: Diagnosis present

## 2018-05-20 DIAGNOSIS — Z20828 Contact with and (suspected) exposure to other viral communicable diseases: Secondary | ICD-10-CM | POA: Diagnosis not present

## 2018-05-20 DIAGNOSIS — R410 Disorientation, unspecified: Secondary | ICD-10-CM | POA: Diagnosis not present

## 2018-05-20 DIAGNOSIS — E785 Hyperlipidemia, unspecified: Secondary | ICD-10-CM | POA: Diagnosis present

## 2018-05-20 DIAGNOSIS — I4581 Long QT syndrome: Secondary | ICD-10-CM | POA: Diagnosis not present

## 2018-05-20 DIAGNOSIS — R7989 Other specified abnormal findings of blood chemistry: Secondary | ICD-10-CM | POA: Diagnosis not present

## 2018-05-20 DIAGNOSIS — Z8249 Family history of ischemic heart disease and other diseases of the circulatory system: Secondary | ICD-10-CM | POA: Diagnosis not present

## 2018-05-20 DIAGNOSIS — G92 Toxic encephalopathy: Secondary | ICD-10-CM | POA: Diagnosis not present

## 2018-05-20 DIAGNOSIS — G20 Parkinson's disease: Secondary | ICD-10-CM | POA: Diagnosis present

## 2018-05-20 DIAGNOSIS — K819 Cholecystitis, unspecified: Secondary | ICD-10-CM

## 2018-05-20 DIAGNOSIS — K5730 Diverticulosis of large intestine without perforation or abscess without bleeding: Secondary | ICD-10-CM | POA: Diagnosis not present

## 2018-05-20 DIAGNOSIS — Z1159 Encounter for screening for other viral diseases: Secondary | ICD-10-CM

## 2018-05-20 DIAGNOSIS — T40605A Adverse effect of unspecified narcotics, initial encounter: Secondary | ICD-10-CM | POA: Diagnosis not present

## 2018-05-20 DIAGNOSIS — Z823 Family history of stroke: Secondary | ICD-10-CM

## 2018-05-20 DIAGNOSIS — Z800 Family history of malignant neoplasm of digestive organs: Secondary | ICD-10-CM | POA: Diagnosis not present

## 2018-05-20 DIAGNOSIS — I10 Essential (primary) hypertension: Secondary | ICD-10-CM | POA: Diagnosis present

## 2018-05-20 DIAGNOSIS — R778 Other specified abnormalities of plasma proteins: Secondary | ICD-10-CM | POA: Diagnosis present

## 2018-05-20 DIAGNOSIS — Z4659 Encounter for fitting and adjustment of other gastrointestinal appliance and device: Secondary | ICD-10-CM | POA: Diagnosis not present

## 2018-05-20 DIAGNOSIS — R918 Other nonspecific abnormal finding of lung field: Secondary | ICD-10-CM | POA: Diagnosis not present

## 2018-05-20 DIAGNOSIS — Z820 Family history of epilepsy and other diseases of the nervous system: Secondary | ICD-10-CM | POA: Diagnosis not present

## 2018-05-20 DIAGNOSIS — I2510 Atherosclerotic heart disease of native coronary artery without angina pectoris: Secondary | ICD-10-CM | POA: Diagnosis present

## 2018-05-20 DIAGNOSIS — I272 Pulmonary hypertension, unspecified: Secondary | ICD-10-CM | POA: Diagnosis present

## 2018-05-20 DIAGNOSIS — I25708 Atherosclerosis of coronary artery bypass graft(s), unspecified, with other forms of angina pectoris: Secondary | ICD-10-CM

## 2018-05-20 DIAGNOSIS — I25810 Atherosclerosis of coronary artery bypass graft(s) without angina pectoris: Secondary | ICD-10-CM | POA: Diagnosis present

## 2018-05-20 DIAGNOSIS — Z01810 Encounter for preprocedural cardiovascular examination: Secondary | ICD-10-CM | POA: Diagnosis not present

## 2018-05-20 DIAGNOSIS — K8020 Calculus of gallbladder without cholecystitis without obstruction: Secondary | ICD-10-CM | POA: Diagnosis not present

## 2018-05-20 DIAGNOSIS — K449 Diaphragmatic hernia without obstruction or gangrene: Secondary | ICD-10-CM | POA: Diagnosis present

## 2018-05-20 DIAGNOSIS — M1990 Unspecified osteoarthritis, unspecified site: Secondary | ICD-10-CM | POA: Diagnosis present

## 2018-05-20 DIAGNOSIS — Z7902 Long term (current) use of antithrombotics/antiplatelets: Secondary | ICD-10-CM

## 2018-05-20 DIAGNOSIS — K810 Acute cholecystitis: Principal | ICD-10-CM | POA: Diagnosis present

## 2018-05-20 DIAGNOSIS — Z7982 Long term (current) use of aspirin: Secondary | ICD-10-CM

## 2018-05-20 DIAGNOSIS — Z888 Allergy status to other drugs, medicaments and biological substances status: Secondary | ICD-10-CM

## 2018-05-20 DIAGNOSIS — R1011 Right upper quadrant pain: Secondary | ICD-10-CM | POA: Diagnosis not present

## 2018-05-20 DIAGNOSIS — R9431 Abnormal electrocardiogram [ECG] [EKG]: Secondary | ICD-10-CM

## 2018-05-20 DIAGNOSIS — I517 Cardiomegaly: Secondary | ICD-10-CM | POA: Diagnosis not present

## 2018-05-20 DIAGNOSIS — R4182 Altered mental status, unspecified: Secondary | ICD-10-CM

## 2018-05-20 DIAGNOSIS — R001 Bradycardia, unspecified: Secondary | ICD-10-CM | POA: Diagnosis not present

## 2018-05-20 DIAGNOSIS — K8000 Calculus of gallbladder with acute cholecystitis without obstruction: Secondary | ICD-10-CM | POA: Diagnosis not present

## 2018-05-20 HISTORY — DX: Epigastric pain: R10.13

## 2018-05-20 HISTORY — DX: Atherosclerosis of coronary artery bypass graft(s) without angina pectoris: I25.810

## 2018-05-20 LAB — CBC WITH DIFFERENTIAL/PLATELET
Abs Immature Granulocytes: 0.05 10*3/uL (ref 0.00–0.07)
Basophils Absolute: 0 10*3/uL (ref 0.0–0.1)
Basophils Relative: 0 %
Eosinophils Absolute: 0 10*3/uL (ref 0.0–0.5)
Eosinophils Relative: 0 %
HCT: 42.7 % (ref 39.0–52.0)
Hemoglobin: 14.5 g/dL (ref 13.0–17.0)
Immature Granulocytes: 0 %
Lymphocytes Relative: 4 %
Lymphs Abs: 0.6 10*3/uL — ABNORMAL LOW (ref 0.7–4.0)
MCH: 30.7 pg (ref 26.0–34.0)
MCHC: 34 g/dL (ref 30.0–36.0)
MCV: 90.5 fL (ref 80.0–100.0)
Monocytes Absolute: 1.1 10*3/uL — ABNORMAL HIGH (ref 0.1–1.0)
Monocytes Relative: 7 %
Neutro Abs: 14.3 10*3/uL — ABNORMAL HIGH (ref 1.7–7.7)
Neutrophils Relative %: 89 %
Platelets: 213 10*3/uL (ref 150–400)
RBC: 4.72 MIL/uL (ref 4.22–5.81)
RDW: 13.7 % (ref 11.5–15.5)
WBC: 16.1 10*3/uL — ABNORMAL HIGH (ref 4.0–10.5)
nRBC: 0 % (ref 0.0–0.2)

## 2018-05-20 LAB — URINALYSIS, ROUTINE W REFLEX MICROSCOPIC
Bilirubin Urine: NEGATIVE
Glucose, UA: NEGATIVE mg/dL
Hgb urine dipstick: NEGATIVE
Ketones, ur: NEGATIVE mg/dL
Leukocytes,Ua: NEGATIVE
Nitrite: NEGATIVE
Protein, ur: NEGATIVE mg/dL
Specific Gravity, Urine: 1.01 (ref 1.005–1.030)
pH: 5 (ref 5.0–8.0)

## 2018-05-20 LAB — COMPREHENSIVE METABOLIC PANEL
ALT: 8 U/L (ref 0–44)
AST: 23 U/L (ref 15–41)
Albumin: 3.6 g/dL (ref 3.5–5.0)
Alkaline Phosphatase: 60 U/L (ref 38–126)
Anion gap: 12 (ref 5–15)
BUN: 11 mg/dL (ref 8–23)
CO2: 23 mmol/L (ref 22–32)
Calcium: 9.9 mg/dL (ref 8.9–10.3)
Chloride: 103 mmol/L (ref 98–111)
Creatinine, Ser: 1.05 mg/dL (ref 0.61–1.24)
GFR calc Af Amer: >60 mL/min (ref >60)
GFR calc non Af Amer: >60 mL/min (ref >60)
Glucose, Bld: 121 mg/dL — ABNORMAL HIGH (ref 70–99)
Potassium: 4 mmol/L (ref 3.5–5.1)
Sodium: 138 mmol/L (ref 135–145)
Total Bilirubin: 0.9 mg/dL (ref 0.3–1.2)
Total Protein: 6.5 g/dL (ref 6.5–8.1)

## 2018-05-20 LAB — TROPONIN I
Troponin I: 0.06 ng/mL (ref <0.03)
Troponin I: 0.05 ng/mL (ref <0.03)

## 2018-05-20 LAB — LIPASE, BLOOD: Lipase: 26 U/L (ref 11–51)

## 2018-05-20 LAB — SARS CORONAVIRUS 2 BY RT PCR (HOSPITAL ORDER, PERFORMED IN ~~LOC~~ HOSPITAL LAB): SARS Coronavirus 2: NEGATIVE

## 2018-05-20 MED ORDER — LIDOCAINE VISCOUS HCL 2 % MT SOLN
15.0000 mL | Freq: Once | OROMUCOSAL | Status: AC
  Administered 2018-05-20: 15 mL via ORAL
  Filled 2018-05-20: qty 15

## 2018-05-20 MED ORDER — MORPHINE SULFATE (PF) 2 MG/ML IV SOLN
2.0000 mg | Freq: Once | INTRAVENOUS | Status: AC
  Administered 2018-05-20: 2 mg via INTRAVENOUS
  Filled 2018-05-20: qty 1

## 2018-05-20 MED ORDER — ASPIRIN 81 MG PO CHEW
324.0000 mg | CHEWABLE_TABLET | Freq: Once | ORAL | Status: AC
  Administered 2018-05-20: 324 mg via ORAL
  Filled 2018-05-20: qty 4

## 2018-05-20 MED ORDER — DEXTROSE-NACL 5-0.9 % IV SOLN
INTRAVENOUS | Status: AC
  Administered 2018-05-20 – 2018-05-21 (×2): via INTRAVENOUS

## 2018-05-20 MED ORDER — IOHEXOL 300 MG/ML  SOLN
100.0000 mL | Freq: Once | INTRAMUSCULAR | Status: AC | PRN
  Administered 2018-05-20: 100 mL via INTRAVENOUS

## 2018-05-20 MED ORDER — PIPERACILLIN-TAZOBACTAM 3.375 G IVPB 30 MIN
3.3750 g | Freq: Once | INTRAVENOUS | Status: AC
  Administered 2018-05-20: 3.375 g via INTRAVENOUS
  Filled 2018-05-20: qty 50

## 2018-05-20 MED ORDER — PIPERACILLIN-TAZOBACTAM 3.375 G IVPB
3.3750 g | Freq: Three times a day (TID) | INTRAVENOUS | Status: DC
  Administered 2018-05-21 – 2018-05-24 (×10): 3.375 g via INTRAVENOUS
  Filled 2018-05-20 (×11): qty 50

## 2018-05-20 MED ORDER — SODIUM CHLORIDE 0.9 % IV BOLUS
1000.0000 mL | Freq: Once | INTRAVENOUS | Status: AC
  Administered 2018-05-20: 1000 mL via INTRAVENOUS

## 2018-05-20 MED ORDER — FAMOTIDINE IN NACL 20-0.9 MG/50ML-% IV SOLN
20.0000 mg | Freq: Once | INTRAVENOUS | Status: AC
  Administered 2018-05-20: 20 mg via INTRAVENOUS
  Filled 2018-05-20: qty 50

## 2018-05-20 MED ORDER — ACETAMINOPHEN 325 MG PO TABS: 650.0000 mg | ORAL_TABLET | Freq: Four times a day (QID) | ORAL | Status: DC | PRN

## 2018-05-20 MED ORDER — ACETAMINOPHEN 650 MG RE SUPP: 650.0000 mg | Freq: Four times a day (QID) | RECTAL | Status: DC | PRN

## 2018-05-20 MED ORDER — ALUM & MAG HYDROXIDE-SIMETH 200-200-20 MG/5ML PO SUSP
30.0000 mL | Freq: Once | ORAL | Status: AC
  Administered 2018-05-20: 30 mL via ORAL
  Filled 2018-05-20: qty 30

## 2018-05-20 NOTE — ED Triage Notes (Signed)
Pt states he has a hiatal hernia and it is aggravating him. He states he has had this since he was 73 years old. Pt went to urgent care today to have it evaluated, and was sent here for further eval. Pt describes it as dull pain in his epigastric area. Denies CP/SOB/N/V/diarrhea.

## 2018-05-20 NOTE — ED Notes (Signed)
ED Provider at bedside. 

## 2018-05-20 NOTE — Consult Note (Signed)
Reason for Consult:cholecystitis Referring Physician: M. Duron Meister is an 72 y.o. male.  HPI: Pleasant 74yo M with a history of MR, CAD status post coronary artery bypass grafting in 2006 by Dr. Prescott Gum, and HTN presented to the emergency department complaining of epigastric abdominal pain that started 2 days ago.  He denies associated nausea.  He claims he tried to do some breathing exercises because he thought this was due to his hiatal hernia.  The pain did not go away so he came to the emergency department.  Evaluation here revealed leukocytosis of 16,100, liver function tests and lipase are normal.  CT scan of the abdomen pelvis showed large hiatal hernia and cholecystitis with cholelithiasis.  Right upper quadrant ultrasound suggested cholecystitis.  I was asked to see him for surgical management.  Of note, during his work-up, his troponin was 0.06 so cardiology is evaluating him as well.  Past Medical History:  Diagnosis Date  . Anxiety state, unspecified   . Arthritis   . Benign neoplasm of colon   . CAD (coronary artery disease) of artery bypass graft 05/20/2018  . Coronary atherosclerosis of unspecified type of vessel, native or graft   . Depressive disorder, not elsewhere classified   . Diaphragmatic hernia without mention of obstruction or gangrene   . Diverticulosis of colon (without mention of hemorrhage)   . Elevated troponin 05/20/2018  . Epigastric pain 05/20/2018  . Esophageal reflux   . Esophageal stricture   . Hiatal hernia   . Mitral regurgitation   . Osteoarthrosis, unspecified whether generalized or localized, unspecified site   . Other and unspecified hyperlipidemia   . Parkinson's disease ([place])   . Personal history of colonic polyps   . Personal history of unspecified circulatory disease   . Unspecified essential hypertension     Past Surgical History:  Procedure Laterality Date  . COLONOSCOPY    . CORONARY ARTERY BYPASS GRAFT     x 2  . INGUINAL  HERNIA REPAIR     bilateral  . LUNG BIOPSY    . POLYPECTOMY    . PTCA      Family History  Problem Relation Age of Onset  . Heart attack Mother   . Stroke Father   . Colon cancer Sister   . Healthy Sister   . Heart disease Brother   . Healthy Brother   . Healthy Brother   . Healthy Brother   . Healthy Daughter   . Fibromyalgia Daughter   . Multiple sclerosis Son     Social History:  reports that he quit smoking about 46 years ago. He has never used smokeless tobacco. He reports that he does not drink alcohol or use drugs.  Allergies:  Allergies  Allergen Reactions  . Atorvastatin Other (See Comments)    Muscle cramps  . Simvastatin Other (See Comments)    Muscle cramps    Medications: I have reviewed the patient's current medications.  Results for orders placed or performed during the hospital encounter of 05/20/18 (from the past 48 hour(s))  Urinalysis, Routine w reflex microscopic     Status: None   Collection Time: 05/20/18  2:43 PM  Result Value Ref Range   Color, Urine YELLOW YELLOW   APPearance CLEAR CLEAR   Specific Gravity, Urine 1.010 1.005 - 1.030   pH 5.0 5.0 - 8.0   Glucose, UA NEGATIVE NEGATIVE mg/dL   Hgb urine dipstick NEGATIVE NEGATIVE   Bilirubin Urine NEGATIVE NEGATIVE  Ketones, ur NEGATIVE NEGATIVE mg/dL   Protein, ur NEGATIVE NEGATIVE mg/dL   Nitrite NEGATIVE NEGATIVE   Leukocytes,Ua NEGATIVE NEGATIVE    Comment: Performed at [facility] [address] [place], [place] [address]  Comprehensive metabolic panel     Status: Abnormal   Collection Time: 05/20/18  2:53 PM  Result Value Ref Range   Sodium 138 135 - 145 mmol/L   Potassium 4.0 3.5 - 5.1 mmol/L   Chloride 103 98 - 111 mmol/L   CO2 23 22 - 32 mmol/L   Glucose, Bld 121 (H) 70 - 99 mg/dL   BUN 11 8 - 23 mg/dL   Creatinine, Ser 1.05 0.61 - 1.24 mg/dL   Calcium 9.9 8.9 - 10.3 mg/dL   Total Protein 6.5 6.5 - 8.1 g/dL   Albumin 3.6 3.5 - 5.0 g/dL   AST 23 15 - 41 U/L   ALT  8 0 - 44 U/L   Alkaline Phosphatase 60 38 - 126 U/L   Total Bilirubin 0.9 0.3 - 1.2 mg/dL   GFR calc non Af Amer >60 >60 mL/min   GFR calc Af Amer >60 >60 mL/min   Anion gap 12 5 - 15    Comment: Performed at [facility] [address], [place], [place] [address]  Lipase, blood     Status: None   Collection Time: 05/20/18  2:53 PM  Result Value Ref Range   Lipase 26 11 - 51 U/L    Comment: Performed at [facility], [place] [address] [place], [place] [address]  Troponin I - Once     Status: Abnormal   Collection Time: 05/20/18  2:53 PM  Result Value Ref Range   Troponin I 0.06 (HH) <0.03 ng/mL    Comment: CRITICAL RESULT CALLED TO, READ BACK BY AND VERIFIED WITH: Lorelei Pont ,RN 05/20/2018 1535 WBOND Performed at [facility], [place] [address], [place], [place] [address]   CBC with Differential     Status: Abnormal   Collection Time: 05/20/18  2:53 PM  Result Value Ref Range   WBC 16.1 (H) 4.0 - 10.5 K/uL   RBC 4.72 4.22 - 5.81 MIL/uL   Hemoglobin 14.5 13.0 - 17.0 g/dL   HCT 42.7 39.0 - 52.0 %   MCV 90.5 80.0 - 100.0 fL   MCH 30.7 26.0 - 34.0 pg   MCHC 34.0 30.0 - 36.0 g/dL   RDW 13.7 11.5 - 15.5 %   Platelets 213 150 - 400 K/uL   nRBC 0.0 0.0 - 0.2 %   Neutrophils Relative % 89 %   Neutro Abs 14.3 (H) 1.7 - 7.7 K/uL   Lymphocytes Relative 4 %   Lymphs Abs 0.6 (L) 0.7 - 4.0 K/uL   Monocytes Relative 7 %   Monocytes Absolute 1.1 (H) 0.1 - 1.0 K/uL   Eosinophils Relative 0 %   Eosinophils Absolute 0.0 0.0 - 0.5 K/uL   Basophils Relative 0 %   Basophils Absolute 0.0 0.0 - 0.1 K/uL   Immature Granulocytes 0 %   Abs Immature Granulocytes 0.05 0.00 - 0.07 K/uL    Comment: Performed at [facility], [place] [address] [place],  [address]    Dg Chest 2 View  Result Date: 05/20/2018 CLINICAL DATA:  Epigastric pain.  History of hiatal hernia. EXAM: CHEST - 2 VIEW COMPARISON:  CT chest 01/27/2014.  PA and lateral chest 01/15/2014. FINDINGS: The patient is  status post CABG. There  is cardiomegaly and atherosclerosis. Lungs are clear. Moderate hiatal hernia is identified and appears increased in size since the prior study. No acute bony abnormality. IMPRESSION: Moderate hiatal hernia appears somewhat increased compared to the prior exams. Cardiomegaly without edema. Atherosclerosis. Electronically Signed   By: Inge Rise M.D.   On: 05/20/2018 15:36   Ct Abdomen Pelvis W Contrast  Result Date: 05/20/2018 CLINICAL DATA:  Initial evaluation for acute upper abdominal pain. EXAM: CT ABDOMEN AND PELVIS WITH CONTRAST TECHNIQUE: Multidetector CT imaging of the abdomen and pelvis was performed using the standard protocol following bolus administration of intravenous contrast. CONTRAST:  137mL OMNIPAQUE IOHEXOL 300 MG/ML  SOLN COMPARISON:  None available. FINDINGS: Lower chest: Scattered atelectatic changes seen dependently within the visualized lung bases, left greater than right. Visualized lungs are otherwise clear. Cardiomegaly with 3 vessel coronary artery calcifications partially visualized. Hepatobiliary: Liver demonstrates a normal contrast enhanced appearance. Focal fat deposition noted adjacent to the falciform ligament. Subcentimeter radiopaque density noted within the gallbladder lumen, compatible with cholelithiasis. Gallbladder itself is somewhat distended with hazy pericholecystic fat stranding and mucosal enhancement, raising the possibility for possible acute cholecystitis. No significant common bile duct dilatation. Mild intrahepatic biliary dilatation. Pancreas: Pancreas within normal limits. No abnormal pancreatic ductal dilatation. Spleen: Spleen within normal limits. Adrenals/Urinary Tract: Adrenal glands are normal. Left kidney mildly atrophic as compared to the right. Symmetric enhancement seen within the kidneys bilaterally. No nephrolithiasis, hydronephrosis or focal enhancing renal mass. No hydroureter. Bladder within normal limits.  Stomach/Bowel: Large hiatal hernia. Stomach otherwise unremarkable. No evidence for bowel obstruction. Normal appendix. Colonic diverticulosis without evidence for acute diverticulitis. No acute inflammatory changes seen about the bowels. Moderate to large volume retained stool within the distal colon, suggesting constipation. Vascular/Lymphatic: Advanced atherosclerotic change throughout the intra-abdominal aorta and its branch vessels. No aneurysm. Mesenteric vessels grossly patent proximally. No adenopathy. Reproductive: Prostate mildly enlarged measuring 5 cm in transverse diameter. Other: No free air or fluid. Musculoskeletal: No acute osseous abnormality. No discrete lytic or blastic osseous lesions. Moderate thoracolumbar levoscoliosis, apex at L2. IMPRESSION: 1. Cholelithiasis with hazy pericholecystic fat stranding and mucosal enhancement raising the possibility for acute cholecystitis. Correlation with laboratory values recommended. Additionally, further evaluation with dedicated right upper quadrant ultrasound could be performed as clinically warranted. 2. Large hiatal hernia. 3. Moderate to large volume retained stool within the distal colon, suggesting constipation. 4. Colonic diverticulosis without evidence for acute diverticulitis. 5. Advanced atherosclerosis with 3 vessel coronary artery calcifications. Electronically Signed   By: Jeannine Boga M.D.   On: 05/20/2018 17:22   US Abdomen Limited Ruq  Result Date: 05/20/2018 CLINICAL DATA:  Upper abdominal/epigastric pain EXAM: ULTRASOUND ABDOMEN LIMITED RIGHT UPPER QUADRANT COMPARISON:  CT abdomen and pelvis May 20, 2018 FINDINGS: Gallbladder: Within the gallbladder, there are echogenic foci which move and shadow consistent with cholelithiasis. Largest gallstone measures 1.1 cm in length. The gallbladder wall is thickened and subtly edematous. There is no appreciable pericholecystic fluid. No sonographic Murphy sign noted by sonographer.  Common bile duct: Diameter: 6 mm. No intrahepatic or extrahepatic biliary duct dilatation. Liver: No focal lesion identified. Within normal limits in parenchymal echogenicity. Portal vein is patent on color Doppler imaging with normal direction of blood flow towards the liver. IMPRESSION: Cholelithiasis. Gallbladder wall mildly thickened and subtly edematous. Suspect a degree of acute cholecystitis given this appearance. Study otherwise unremarkable. Electronically Signed   By: Lowella Grip III M.D.   On: 05/20/2018 19:15    Review of Systems  Constitutional: Negative for chills and fever.  HENT: Negative.   Eyes: Negative.   Respiratory: Negative for cough and shortness of breath.   Cardiovascular: Negative for chest pain.  Gastrointestinal: Positive for abdominal pain and constipation. Negative for nausea and vomiting.  Genitourinary: Negative.   Musculoskeletal: Negative.   Skin: Negative.   Neurological: Negative.   Endo/Heme/Allergies: Bruises/bleeds easily.  Psychiatric/Behavioral: Negative.    Blood pressure (!) 149/81, pulse 72, temperature 98.1 F (36.7 C), temperature source Oral, resp. rate 14, height 5\' 4"  (1.626 m), weight 66.7 kg, SpO2 94 %. Physical Exam  Constitutional: He is oriented to person, place, and time. He appears well-developed and well-nourished. No distress.  HENT:  Head: Normocephalic.  Right Ear: External ear normal.  Left Ear: External ear normal.  Nose: Nose normal.  Eyes: Pupils are equal, round, and reactive to light.  Neck: Neck supple. No tracheal deviation present. No thyromegaly present.  Cardiovascular: Normal rate and regular rhythm.  No murmur heard. Respiratory: Effort normal and breath sounds normal. No respiratory distress. He has no wheezes. He has no rales.  GI: Soft. He exhibits no distension. There is abdominal tenderness. There is no rebound and no guarding.  Tenderness in the epigastrium and right upper quadrant, no mass, no  generalized peritonitis, bowel sounds are present  Musculoskeletal: Normal range of motion.        General: No edema.  Neurological: He is alert and oriented to person, place, and time.  Skin: Skin is warm.  Psychiatric: He has a normal mood and affect.    Assessment/Plan: Cholecystitis with cholelithiasis - agree with medical admission, NPO except meds, IV Zosyn has been started in the emergency department.  Agree with cardiology evaluation.  This will help determine if he is a surgical candidate to undergo laparoscopic cholecystectomy this admission.  Of note, he has been taking aspirin and Plavix as well.  Hold Plavix. We will follow-up and determine next steps as more data is available.  I discussed the plan with him and answered his questions.  Zenovia Jarred 05/20/2018, 7:56 PM

## 2018-05-20 NOTE — H&P (Signed)
History and Physical    Brandon Rojas SHF:[id number] DOB: Jul 14, 1945 DOA: 05/20/2018  PCP: Biagio Borg, MD  Patient coming from: Home.  Chief Complaint: Epigastric pain.  HPI: Brandon Rojas is a 73 y.o. male with story of CAD status post CABG, hypertension, Parkinson's disease presents to the ER with complaints of epigastric pain ongoing for last 2 days.  Patient initially thought it was due to his hiatal hernia.  Denies any associated vomiting or diarrhea denies any fever chills.  Denies any chest pain.  ED Course: In the ER on exam patient has right upper quadrant tenderness.  CT abdomen followed by ultrasound shows features concerning for acute cholecystitis.  WBC count is elevated LFTs are normal.  On-call general surgeon was consulted.  Started on empiric antibiotics.  Given history of CAD troponin was done which shows mild elevation.  Cardiology was consulted.  At the time of my exam patient is tender in the abdomen denies any chest pain.  Review of Systems: As per HPI, rest all negative.   Past Medical History:  Diagnosis Date   Anxiety state, unspecified    Arthritis    Benign neoplasm of colon    CAD (coronary artery disease) of artery bypass graft 05/20/2018   Coronary atherosclerosis of unspecified type of vessel, native or graft    Depressive disorder, not elsewhere classified    Diaphragmatic hernia without mention of obstruction or gangrene    Diverticulosis of colon (without mention of hemorrhage)    Elevated troponin 05/20/2018   Epigastric pain 05/20/2018   Esophageal reflux    Esophageal stricture    Hiatal hernia    Mitral regurgitation    Osteoarthrosis, unspecified whether generalized or localized, unspecified site    Other and unspecified hyperlipidemia    Parkinson's disease ([place])    Personal history of colonic polyps    Personal history of unspecified circulatory disease    Unspecified essential hypertension     Past Surgical  History:  Procedure Laterality Date   COLONOSCOPY     CORONARY ARTERY BYPASS GRAFT     x 2   INGUINAL HERNIA REPAIR     bilateral   LUNG BIOPSY     POLYPECTOMY     PTCA       reports that he quit smoking about 46 years ago. He has never used smokeless tobacco. He reports that he does not drink alcohol or use drugs.  Allergies  Allergen Reactions   Atorvastatin Other (See Comments)    Muscle cramps   Simvastatin Other (See Comments)    Muscle cramps    Family History  Problem Relation Age of Onset   Heart attack Mother    Stroke Father    Colon cancer Sister    Healthy Sister    Heart disease Brother    Healthy Brother    Healthy Brother    Healthy Brother    Healthy Daughter    Fibromyalgia Daughter    Multiple sclerosis Son     Prior to Admission medications   Medication Sig Start Date End Date Taking? Authorizing Provider  aspirin 81 MG tablet Take 81 mg by mouth daily.     Yes [provider]  B Complex Vitamins (VITAMIN B COMPLEX PO) Take 1 tablet by mouth every other day.    Yes [provider]  carbidopa-levodopa (SINEMET IR) 25-100 MG tablet Take 1 tablet by mouth 3 (three) times daily. 05/06/18  Yes Tat, Eustace Quail, DO  clopidogrel (PLAVIX) 75 MG tablet TAKE 1 TABLET EVERY DAY WITH BREAKFAST Patient taking differently: Take 75 mg by mouth daily.  04/29/18  Yes Biagio Borg, MD  esomeprazole (NEXIUM) 40 MG capsule Take 1 capsule (40 mg total) by mouth daily. 05/06/17  Yes Biagio Borg, MD  lovastatin (MEVACOR) 20 MG tablet TAKE 1 TABLET AT BEDTIME Patient taking differently: Take 20 mg by mouth at bedtime.  04/29/18  Yes Biagio Borg, MD  NITROSTAT 0.4 MG SL tablet PLACE 1 TABLET (0.4 MG TOTAL) UNDER THE TONGUE EVERY 5 (FIVE) MINUTES AS NEEDED. Patient taking differently: Place 0.4 mg under the tongue every 5 (five) minutes as needed for chest pain.  09/21/14  Yes Minus Breeding, MD  Omega-3 Fatty Acids (FISH OIL) 1000 MG CAPS  Take 1,000-2,000 mg by mouth 2 (two) times a week.    Yes [provider]  tetrahydrozoline-zinc (VISINE-AC) 0.05-0.25 % ophthalmic solution Place 2 drops into both eyes 3 (three) times daily as needed (for burning or eye redness).   Yes [provider]  tiZANidine (ZANAFLEX) 2 MG tablet Take 1 tablet (2 mg total) by mouth every 6 (six) hours as needed for muscle spasms. 11/12/17  Yes Biagio Borg, MD  traMADol (ULTRAM) 50 MG tablet Take 1 tablet (50 mg total) by mouth every 6 (six) hours as needed. for pain Patient taking differently: Take 50 mg by mouth every 6 (six) hours as needed (for pain).  05/13/18  Yes Biagio Borg, MD  vitamin E 400 UNIT capsule Take 400 Units by mouth 2 (two) times a week.    Yes [provider]    Physical Exam: Vitals:   05/20/18 1515 05/20/18 1739 05/20/18 1800 05/20/18 1815  BP: (!) 155/77 (!) 184/77 (!) 160/74 (!) 149/81  Pulse: 73 82 73 72  Resp: 17 15 14 14   Temp:      TempSrc:      SpO2: 92% 96% 93% 94%  Weight:      Height:          Constitutional: Moderately built and nourished. Vitals:   05/20/18 1515 05/20/18 1739 05/20/18 1800 05/20/18 1815  BP: (!) 155/77 (!) 184/77 (!) 160/74 (!) 149/81  Pulse: 73 82 73 72  Resp: 17 15 14 14   Temp:      TempSrc:      SpO2: 92% 96% 93% 94%  Weight:      Height:       Eyes: Anicteric no pallor. ENMT: No discharge from the ears eyes nose and mouth. Neck: No mass felt.  No neck rigidity. Respiratory: No rhonchi or crepitations. Cardiovascular: S1-S2 heard. Abdomen: Soft tenderness present in the right upper quadrant. Musculoskeletal: No edema.  No joint effusion. Skin: No rash. Neurologic: Alert awake oriented to time place and person.  Moves all extremities. Psychiatric: Appears normal.  Normal affect.   Labs on Admission: I have personally reviewed following labs and imaging studies  CBC: Recent Labs  Lab 05/15/18 1057 05/20/18 1453  WBC 8.8 16.1*  NEUTROABS 5.9  14.3*  HGB 15.3 14.5  HCT 45.0 42.7  MCV 91.1 90.5  PLT 230.0 093   Basic Metabolic Panel: Recent Labs  Lab 05/15/18 1057 05/20/18 1453  NA 139 138  K 4.6 4.0  CL 104 103  CO2 28 23  GLUCOSE 89 121*  BUN 17 11  CREATININE 1.07 1.05  CALCIUM 9.5 9.9   GFR: Estimated Creatinine Clearance: 51.7 mL/min (by C-G formula based on  SCr of 1.05 mg/dL). Liver Function Tests: Recent Labs  Lab 05/15/18 1057 05/20/18 1453  AST 22 23  ALT 4 8  ALKPHOS 65 60  BILITOT 0.6 0.9  PROT 6.6 6.5  ALBUMIN 4.1 3.6   Recent Labs  Lab 05/20/18 1453  LIPASE 26   No results for input(s): AMMONIA in the last 168 hours. Coagulation Profile: No results for input(s): INR, PROTIME in the last 168 hours. Cardiac Enzymes: Recent Labs  Lab 05/20/18 1453  TROPONINI 0.06*   BNP (last 3 results) No results for input(s): PROBNP in the last 8760 hours. HbA1C: No results for input(s): HGBA1C in the last 72 hours. CBG: No results for input(s): GLUCAP in the last 168 hours. Lipid Profile: No results for input(s): CHOL, HDL, LDLCALC, TRIG, CHOLHDL, LDLDIRECT in the last 72 hours. Thyroid Function Tests: No results for input(s): TSH, T4TOTAL, FREET4, T3FREE, THYROIDAB in the last 72 hours. Anemia Panel: No results for input(s): VITAMINB12, FOLATE, FERRITIN, TIBC, IRON, RETICCTPCT in the last 72 hours. Urine analysis:    Component Value Date/Time   COLORURINE YELLOW 05/20/2018 1443   APPEARANCEUR CLEAR 05/20/2018 1443   LABSPEC 1.010 05/20/2018 1443   PHURINE 5.0 05/20/2018 1443   GLUCOSEU NEGATIVE 05/20/2018 1443   GLUCOSEU NEGATIVE 05/15/2018 1057   HGBUR NEGATIVE 05/20/2018 [place] 05/20/2018 1443   KETONESUR NEGATIVE 05/20/2018 1443   PROTEINUR NEGATIVE 05/20/2018 1443   UROBILINOGEN 0.2 05/15/2018 1057   NITRITE NEGATIVE 05/20/2018 1443   LEUKOCYTESUR NEGATIVE 05/20/2018 1443   Sepsis Labs: @LABRCNTIP (procalcitonin:4,lacticidven:4) )No results found for this or  any previous visit (from the past 240 hour(s)).   Radiological Exams on Admission: Dg Chest 2 View  Result Date: 05/20/2018 CLINICAL DATA:  Epigastric pain.  History of hiatal hernia. EXAM: CHEST - 2 VIEW COMPARISON:  CT chest 01/27/2014.  PA and lateral chest 01/15/2014. FINDINGS: The patient is status post CABG. There is cardiomegaly and atherosclerosis. Lungs are clear. Moderate hiatal hernia is identified and appears increased in size since the prior study. No acute bony abnormality. IMPRESSION: Moderate hiatal hernia appears somewhat increased compared to the prior exams. Cardiomegaly without edema. Atherosclerosis. Electronically Signed   By: Inge Rise M.D.   On: 05/20/2018 15:36   Ct Abdomen Pelvis W Contrast  Result Date: 05/20/2018 CLINICAL DATA:  Initial evaluation for acute upper abdominal pain. EXAM: CT ABDOMEN AND PELVIS WITH CONTRAST TECHNIQUE: Multidetector CT imaging of the abdomen and pelvis was performed using the standard protocol following bolus administration of intravenous contrast. CONTRAST:  158mL OMNIPAQUE IOHEXOL 300 MG/ML  SOLN COMPARISON:  None available. FINDINGS: Lower chest: Scattered atelectatic changes seen dependently within the visualized lung bases, left greater than right. Visualized lungs are otherwise clear. Cardiomegaly with 3 vessel coronary artery calcifications partially visualized. Hepatobiliary: Liver demonstrates a normal contrast enhanced appearance. Focal fat deposition noted adjacent to the falciform ligament. Subcentimeter radiopaque density noted within the gallbladder lumen, compatible with cholelithiasis. Gallbladder itself is somewhat distended with hazy pericholecystic fat stranding and mucosal enhancement, raising the possibility for possible acute cholecystitis. No significant common bile duct dilatation. Mild intrahepatic biliary dilatation. Pancreas: Pancreas within normal limits. No abnormal pancreatic ductal dilatation. Spleen: Spleen  within normal limits. Adrenals/Urinary Tract: Adrenal glands are normal. Left kidney mildly atrophic as compared to the right. Symmetric enhancement seen within the kidneys bilaterally. No nephrolithiasis, hydronephrosis or focal enhancing renal mass. No hydroureter. Bladder within normal limits. Stomach/Bowel: Large hiatal hernia. Stomach otherwise unremarkable. No evidence for bowel obstruction. Normal  appendix. Colonic diverticulosis without evidence for acute diverticulitis. No acute inflammatory changes seen about the bowels. Moderate to large volume retained stool within the distal colon, suggesting constipation. Vascular/Lymphatic: Advanced atherosclerotic change throughout the intra-abdominal aorta and its branch vessels. No aneurysm. Mesenteric vessels grossly patent proximally. No adenopathy. Reproductive: Prostate mildly enlarged measuring 5 cm in transverse diameter. Other: No free air or fluid. Musculoskeletal: No acute osseous abnormality. No discrete lytic or blastic osseous lesions. Moderate thoracolumbar levoscoliosis, apex at L2. IMPRESSION: 1. Cholelithiasis with hazy pericholecystic fat stranding and mucosal enhancement raising the possibility for acute cholecystitis. Correlation with laboratory values recommended. Additionally, further evaluation with dedicated right upper quadrant ultrasound could be performed as clinically warranted. 2. Large hiatal hernia. 3. Moderate to large volume retained stool within the distal colon, suggesting constipation. 4. Colonic diverticulosis without evidence for acute diverticulitis. 5. Advanced atherosclerosis with 3 vessel coronary artery calcifications. Electronically Signed   By: Jeannine Boga M.D.   On: 05/20/2018 17:22   US Abdomen Limited Ruq  Result Date: 05/20/2018 CLINICAL DATA:  Upper abdominal/epigastric pain EXAM: ULTRASOUND ABDOMEN LIMITED RIGHT UPPER QUADRANT COMPARISON:  CT abdomen and pelvis May 20, 2018 FINDINGS: Gallbladder: Within  the gallbladder, there are echogenic foci which move and shadow consistent with cholelithiasis. Largest gallstone measures 1.1 cm in length. The gallbladder wall is thickened and subtly edematous. There is no appreciable pericholecystic fluid. No sonographic Murphy sign noted by sonographer. Common bile duct: Diameter: 6 mm. No intrahepatic or extrahepatic biliary duct dilatation. Liver: No focal lesion identified. Within normal limits in parenchymal echogenicity. Portal vein is patent on color Doppler imaging with normal direction of blood flow towards the liver. IMPRESSION: Cholelithiasis. Gallbladder wall mildly thickened and subtly edematous. Suspect a degree of acute cholecystitis given this appearance. Study otherwise unremarkable. Electronically Signed   By: Lowella Grip III M.D.   On: 05/20/2018 19:15    EKG: Independently reviewed.  Normal sinus rhythm.  Assessment/Plan Principal Problem:   Acute cholecystitis Active Problems:   Parkinson's disease ([facility])   Elevated troponin   Epigastric pain   CAD (coronary artery disease) of artery bypass graft    1. Acute cholecystitis -appreciate general surgery consult.  Patient kept n.p.o. on Zosyn.  Further recommendation per surgery. 2. Elevated troponin with a history of CAD denies any chest pain at this time we will cycle cardiac markers.  Appreciate cardiology consult.  Patient is n.p.o. 3. History of Parkinson's disease on carbidopa which we will restart as well as possible.  If no surgery planned tomorrow then restart Sinemet.   DVT prophylaxis: SCDs in anticipation of surgery. Code Status: Full code. Family Communication: Discussed with patient. Disposition Plan: Home. Consults called: Cardiology and surgery. Admission status: Inpatient.   Rise Patience MD [facility] Pager [phone].  If 7PM-7AM, please contact night-coverage [email or web address] Password [facility]  05/20/2018, 8:12 PM

## 2018-05-20 NOTE — Consult Note (Addendum)
Cardiology Consult:   Patient ID: Brandon Rojas; MRN: [id number]; DOB: 1945-12-10   Admission date: 05/20/2018  Primary Care Provider: Biagio Borg, MD Primary Cardiologist: Minus Breeding, MD 07/03/2017 Primary Electrophysiologist:  None  Chief Complaint:  Chest pain  Patient Profile:   Brandon Rojas is a 73 y.o. male with a history of CABG 2006 w/ LIMA-LAD, SVG-PLA, Parkinson's, MR, OA, HTN,  HLD, GERD, colon polyps.   Mr Zufall came to the ER for epigastric pain, troponin was elevated and Cardiology was asked to evaluate him.  History of Present Illness:   Mr. Mellinger was doing well when seen 06/2017.   He was fine until he laid down yesterday pm and did breathing exercises. He was fine till he was wakened by epigastric pain. It was dull, reached 9-10/10. He drank some Sprite, it helped a little (that usually helps). He also took a Nexium, feels that helped a little. It hurt worse to take a deep breath. It did not remind him of his heart sx.   Pain woke him about 2 am, it eased off to a 6/10, but has been continuous since it started. This type of pain has bothered him intermittently since age 35.   He came to the ER because of the ongoing pain, more severe than usual. Since in the ER, he got IV Pepcid, GI cocktail and morphine. Feels the pain has improved some, but is still a 6/10.  Also got ASA 324 mg.  Prior to this a.m., his activity level was as usual. Feels he overdid it with the breathing exercises yesterday, but had no chest pain.   Activity level has been as usual, no DOE, no chest pain with exertion.  Denies lower extremity edema, orthopnea or PND.    Past Medical History:  Diagnosis Date   Anxiety state, unspecified    Arthritis    Benign neoplasm of colon    CAD (coronary artery disease) of artery bypass graft 05/20/2018   Coronary atherosclerosis of unspecified type of vessel, native or graft    Depressive disorder, not elsewhere classified     Diaphragmatic hernia without mention of obstruction or gangrene    Diverticulosis of colon (without mention of hemorrhage)    Elevated troponin 05/20/2018   Epigastric pain 05/20/2018   Esophageal reflux    Esophageal stricture    Hiatal hernia    Mitral regurgitation    Osteoarthrosis, unspecified whether generalized or localized, unspecified site    Other and unspecified hyperlipidemia    Parkinson's disease ([place])    Personal history of colonic polyps    Personal history of unspecified circulatory disease    Unspecified essential hypertension     Past Surgical History:  Procedure Laterality Date   COLONOSCOPY     CORONARY ARTERY BYPASS GRAFT     x 2   INGUINAL HERNIA REPAIR     bilateral   LUNG BIOPSY     POLYPECTOMY     PTCA       Medications Prior to Admission: Prior to Admission medications   Medication Sig Start Date End Date Taking? Authorizing Provider  aspirin 81 MG tablet Take 81 mg by mouth daily.     Yes [provider]  B Complex Vitamins (VITAMIN B COMPLEX PO) Take 1 tablet by mouth every other day.    Yes [provider]  carbidopa-levodopa (SINEMET IR) 25-100 MG tablet Take 1 tablet by mouth 3 (three) times daily. 05/06/18  Yes Tat, Eustace Quail, DO  clopidogrel (PLAVIX) 75 MG tablet TAKE 1 TABLET EVERY DAY WITH BREAKFAST Patient taking differently: Take 75 mg by mouth daily.  04/29/18  Yes Biagio Borg, MD  esomeprazole (NEXIUM) 40 MG capsule Take 1 capsule (40 mg total) by mouth daily. 05/06/17  Yes Biagio Borg, MD  lovastatin (MEVACOR) 20 MG tablet TAKE 1 TABLET AT BEDTIME Patient taking differently: Take 20 mg by mouth at bedtime.  04/29/18  Yes Biagio Borg, MD  NITROSTAT 0.4 MG SL tablet PLACE 1 TABLET (0.4 MG TOTAL) UNDER THE TONGUE EVERY 5 (FIVE) MINUTES AS NEEDED. Patient taking differently: Place 0.4 mg under the tongue every 5 (five) minutes as needed for chest pain.  09/21/14  Yes Minus Breeding, MD  Omega-3 Fatty  Acids (FISH OIL) 1000 MG CAPS Take 1,000-2,000 mg by mouth 2 (two) times a week.    Yes [provider]  tetrahydrozoline-zinc (VISINE-AC) 0.05-0.25 % ophthalmic solution Place 2 drops into both eyes 3 (three) times daily as needed (for burning or eye redness).   Yes [provider]  tiZANidine (ZANAFLEX) 2 MG tablet Take 1 tablet (2 mg total) by mouth every 6 (six) hours as needed for muscle spasms. 11/12/17  Yes Biagio Borg, MD  traMADol (ULTRAM) 50 MG tablet Take 1 tablet (50 mg total) by mouth every 6 (six) hours as needed. for pain Patient taking differently: Take 50 mg by mouth every 6 (six) hours as needed.  05/13/18  Yes Biagio Borg, MD  vitamin E 400 UNIT capsule Take 400 Units by mouth 2 (two) times a week.    Yes [provider]     Allergies:    Allergies  Allergen Reactions   Atorvastatin Other (See Comments)    Muscle cramps   Simvastatin Other (See Comments)    Muscle cramps    Social History:   Social History   Socioeconomic History   Marital status: Married    Spouse name: Not on file   Number of children: 3   Years of education: Not on file   Highest education level: Not on file  Occupational History   Occupation: PASTOR    Employer: [employer] resource strain: Not on file   Food insecurity:    Worry: Not on file    Inability: Not on file   Transportation needs:    Medical: Not on file    Non-medical: Not on file  Tobacco Use   Smoking status: Former Smoker    Last attempt to quit: 02/19/1972    Years since quitting: 46.2   Smokeless tobacco: Never Used  Substance and Sexual Activity   Alcohol use: No    Alcohol/week: 0.0 standard drinks   Drug use: No   Sexual activity: Not on file  Lifestyle   Physical activity:    Days per week: Not on file    Minutes per session: Not on file   Stress: Not on file  Relationships   Social connections:    Talks on phone: Not  on file    Gets together: Not on file    Attends religious service: Not on file    Active member of club or organization: Not on file    Attends meetings of clubs or organizations: Not on file    Relationship status: Not on file   Intimate partner violence:    Fear of current or ex partner: Not on file    Emotionally abused:  Not on file    Physically abused: Not on file    Forced sexual activity: Not on file  Other Topics Concern   Not on file  Social History Narrative   Not on file    Family History:   The patient's family history includes Colon cancer in his sister; Fibromyalgia in his daughter; Healthy in his brother, brother, brother, daughter, and sister; Heart attack in his mother; Heart disease in his brother; Multiple sclerosis in his son; Stroke in his father.   The patient He indicated that his mother is deceased. He indicated that his father is deceased. He indicated that both of his sisters are alive. He indicated that all of his four brothers are alive. He indicated that both of his daughters are alive. He indicated that his son is alive.    ROS:  Please see the history of present illness.  All other ROS reviewed and negative.     Physical Exam/Data:   Vitals:   05/20/18 1415 05/20/18 1445 05/20/18 1515 05/20/18 1739  BP: (!) 160/70 (!) 167/69 (!) 155/77 (!) 184/77  Pulse: 73 74 73 82  Resp: 20 (!) 22 17 15   Temp:      TempSrc:      SpO2: 92% 94% 92% 96%  Weight:      Height:       No intake or output data in the 24 hours ending 05/20/18 1805 Filed Weights   05/20/18 1335  Weight: 66.7 kg   Body mass index is 25.23 kg/m.  General:  Well nourished, well developed, in mild acute distress HEENT: normal Lymph: no adenopathy Neck:  JVD not elevated Endocrine:  No thryomegaly Vascular: No carotid bruits; 4/4 extremity pulses 2+ bilaterally    Cardiac:  normal S1, S2; RRR; no murmur, no rub or gallop  Lungs:  clear to auscultation bilaterally, no  wheezing, rhonchi or rales  Abd: soft, tender mid upper quadrant, no hepatomegaly  Ext: No lower extremity edema Musculoskeletal:  No deformities, BUE and BLE strength normal and equal Skin: warm and dry  Neuro:  CNs 2-12 intact, no focal abnormalities noted Psych:  Normal affect    EKG:  The ECG that was done 05/12 was personally reviewed and demonstrates SR, HR 71, R axis deviation, diffuse ST/T wave abnormalities, not significantly different from 06/2017  Relevant CV Studies:  ECHO:  07/09/2017 - Left ventricle: The cavity size was normal. Wall thickness was   increased in a pattern of mild LVH. Systolic function was normal.   The estimated ejection fraction was in the range of 55% to 60%.   Wall motion was normal; there were no regional wall motion   abnormalities. Features are consistent with a pseudonormal left   ventricular filling pattern, with concomitant abnormal relaxation   and increased filling pressure (grade 2 diastolic dysfunction). - Mitral valve: Mildly calcified annulus. There was moderate   regurgitation. - Left atrium: The atrium was severely dilated. - Right ventricle: The cavity size was mildly dilated. - Right atrium: The atrium was severely dilated. - Tricuspid valve: There was moderate regurgitation. - Pulmonary arteries: Systolic pressure was moderately increased.   PA peak pressure: 44 mm Hg (S).  CATH: 03/08/2010 ANGIOGRAPHIC DATA: 1. The left main is free of critical disease. 2. The LAD is diffusely irregular proximally and diffusely plaqued.     Beyond this, there is a stent previously placed leading into a     smaller caliber diagonal with about 60% proximal narrowing.  The     stent has about 40% in-stent re-narrowing.  After the takeoff of     the small diagonal, there is an 80% stenosis and had evidence of     competitive filling distally. 3. The internal mammary to the distal LAD is widely patent, fills the     LAD retrograde. 4. The  circumflex is a dominant vessel.  There is 80% narrowing prior     to the first tiny marginal branch.  The first marginal branch has     70% narrowing proximally and is only about 1.4-mm caliber vessel.     The circumflex in this territory is somewhat diffusely diseased and     then opens back up and has about 70% narrowing distally. 5. The saphenous vein graft to the distal posterolateral branch is     widely patent without significant narrowing and retrograde fills     the posterolateral branch into the PDA. 6. The right coronary artery is small dominant vessel without critical     disease. 7. Ventriculography in the RAO projection reveals well-preserved     global systolic function. CONCLUSION: 1. Well-preserved left ventricular function. 2. Continued patency of internal mammary to the LAD. 3. Continued patency of the saphenous vein graft to the posterolateral     branch. 4. Small-caliber disease involving the first diagonal and first     marginal that do not appear to be optimal for percutaneous     intervention. DISPOSITION:  The exact etiology of these symptoms are unclear.  We will get a D-dimer.  I will also likely do some other studies.  We will see him back in the office in the next week.  Laboratory Data:  Chemistry Recent Labs  Lab 05/15/18 1057 05/20/18 1453  NA 139 138  K 4.6 4.0  CL 104 103  CO2 28 23  GLUCOSE 89 121*  BUN 17 11  CREATININE 1.07 1.05  CALCIUM 9.5 9.9  GFRNONAA  --  >60  GFRAA  --  >60  ANIONGAP  --  12    Recent Labs  Lab 05/15/18 1057 05/20/18 1453  PROT 6.6 6.5  ALBUMIN 4.1 3.6  AST 22 23  ALT 4 8  ALKPHOS 65 60  BILITOT 0.6 0.9   Hematology Recent Labs  Lab 05/15/18 1057 05/20/18 1453  WBC 8.8 16.1*  RBC 4.94 4.72  HGB 15.3 14.5  HCT 45.0 42.7  MCV 91.1 90.5  MCH  --  30.7  MCHC 34.1 34.0  RDW 14.7 13.7  PLT 230.0 213   Cardiac Enzymes Recent Labs  Lab 05/20/18 1453  TROPONINI 0.06*       Radiology/Studies:  Dg Chest 2 View  Result Date: 05/20/2018 CLINICAL DATA:  Epigastric pain.  History of hiatal hernia. EXAM: CHEST - 2 VIEW COMPARISON:  CT chest 01/27/2014.  PA and lateral chest 01/15/2014. FINDINGS: The patient is status post CABG. There is cardiomegaly and atherosclerosis. Lungs are clear. Moderate hiatal hernia is identified and appears increased in size since the prior study. No acute bony abnormality. IMPRESSION: Moderate hiatal hernia appears somewhat increased compared to the prior exams. Cardiomegaly without edema. Atherosclerosis. Electronically Signed   By: Inge Rise M.D.   On: 05/20/2018 15:36   Ct Abdomen Pelvis W Contrast  Result Date: 05/20/2018 CLINICAL DATA:  Initial evaluation for acute upper abdominal pain. EXAM: CT ABDOMEN AND PELVIS WITH CONTRAST TECHNIQUE: Multidetector CT imaging of the abdomen and pelvis was performed using the standard protocol following  bolus administration of intravenous contrast. CONTRAST:  13mL OMNIPAQUE IOHEXOL 300 MG/ML  SOLN COMPARISON:  None available. FINDINGS: Lower chest: Scattered atelectatic changes seen dependently within the visualized lung bases, left greater than right. Visualized lungs are otherwise clear. Cardiomegaly with 3 vessel coronary artery calcifications partially visualized. Hepatobiliary: Liver demonstrates a normal contrast enhanced appearance. Focal fat deposition noted adjacent to the falciform ligament. Subcentimeter radiopaque density noted within the gallbladder lumen, compatible with cholelithiasis. Gallbladder itself is somewhat distended with hazy pericholecystic fat stranding and mucosal enhancement, raising the possibility for possible acute cholecystitis. No significant common bile duct dilatation. Mild intrahepatic biliary dilatation. Pancreas: Pancreas within normal limits. No abnormal pancreatic ductal dilatation. Spleen: Spleen within normal limits. Adrenals/Urinary Tract: Adrenal glands are  normal. Left kidney mildly atrophic as compared to the right. Symmetric enhancement seen within the kidneys bilaterally. No nephrolithiasis, hydronephrosis or focal enhancing renal mass. No hydroureter. Bladder within normal limits. Stomach/Bowel: Large hiatal hernia. Stomach otherwise unremarkable. No evidence for bowel obstruction. Normal appendix. Colonic diverticulosis without evidence for acute diverticulitis. No acute inflammatory changes seen about the bowels. Moderate to large volume retained stool within the distal colon, suggesting constipation. Vascular/Lymphatic: Advanced atherosclerotic change throughout the intra-abdominal aorta and its branch vessels. No aneurysm. Mesenteric vessels grossly patent proximally. No adenopathy. Reproductive: Prostate mildly enlarged measuring 5 cm in transverse diameter. Other: No free air or fluid. Musculoskeletal: No acute osseous abnormality. No discrete lytic or blastic osseous lesions. Moderate thoracolumbar levoscoliosis, apex at L2. IMPRESSION: 1. Cholelithiasis with hazy pericholecystic fat stranding and mucosal enhancement raising the possibility for acute cholecystitis. Correlation with laboratory values recommended. Additionally, further evaluation with dedicated right upper quadrant ultrasound could be performed as clinically warranted. 2. Large hiatal hernia. 3. Moderate to large volume retained stool within the distal colon, suggesting constipation. 4. Colonic diverticulosis without evidence for acute diverticulitis. 5. Advanced atherosclerosis with 3 vessel coronary artery calcifications. Electronically Signed   By: Jeannine Boga M.D.   On: 05/20/2018 17:22    Assessment and Plan:   1. Epigastric pain:  - CT is concerning for cholecystitis, ER MD aware - per IM/ER  2. Elevated troponin - no recent chest pain - continue to cycle ez - if ez do not significantly elevate, doubt further cardiac workup needed.  - if pt needs lap choley, MD  advise if stress test needed first.  Otherwise, per IM/ER staff Principal Problem:   Epigastric pain Active Problems:   Elevated troponin   CAD (coronary artery disease) of artery bypass graft    For questions or updates, please contact [facility] Please consult [email or web address] for contact info under Cardiology/STEMI.    Signed, Rosaria Ferries, PA-C  05/20/2018 6:05 PM   I have examined the patient and reviewed assessment and plan and discussed with patient.  Agree with above as stated.    On exam, his pain was more abdominal/epigastric.  Troponin level is minimally increased.  If the troponin trend is flat, I would lean more to an abdominal source for his sx.  If troponin increases significantly, would consider further cardiac testing, but he feels that his pain is more abdominal and different from prior angina.  He states that he had been walking without difficulty prior to yesterday.  Pain started after som ebreathing exercises and he feels that his pain originates from his abdomen.  If urgent surgery is needed for his gallbladder, would proceed without further cardiac testing at this point.  Will update that recommendation based on cardiac enzyme results  that occur overnight.   No heparin unless there is a significant troponin increase.    Discussed cardiac findings with wife along with the plan.  SHe was somewhat upset that she could not come to the hospital.  I explained that this was part of preventing the spread of COVID19.      Larae Grooms

## 2018-05-20 NOTE — ED Notes (Signed)
I spoke with pt's wife, Vaughan Basta and let her know that the pt has arrived and he is being assessed by the PA. I informed her that once we have more information back from tests I will give her another update.

## 2018-05-20 NOTE — ED Notes (Signed)
Nurse Navigator received call from pt wife who wanted to give an updated med list.  Gave message to pharmacy tech. Wife is emotional about being separated from her husband, will check back in with her.

## 2018-05-20 NOTE — ED Provider Notes (Signed)
[facility] EMERGENCY DEPARTMENT Provider Note   CSN: [id number] Arrival date & time: 05/20/18  1321    History   Chief Complaint Chief Complaint  Patient presents with  . Hernia    HPI Domnique Vantine Knotts is a 73 y.o. male.     HPI   Rien Marland Mentzer is a 73 y.o. male, with a history of anxiety, hiatal hernia, GERD, Parkinson's, presenting to the ED with lower upper abdominal pain beginning around 2 AM this morning.  Pain is aching, constant, 8/10, nonradiating.  No change with exertion or eating.  Worse with leaning forward in a sitting position.  He states he has a known hiatal hernia for which he takes Nexium.  He has been compliant with his Nexium. Denies alcohol or tobacco abuse.  He takes 600 mg ibuprofen once or twice a week.  Denies fever/chills, shortness of breath, N/V/C/D, urinary symptoms, diaphoresis, dizziness, syncope, neurologic deficits, cough, lower extremity edema/pain, or any other complaints.  Cardiologist: Dr. Percival Spanish  Last record of endoscopy in our system was performed by Dr. Henrene Pastor of [facility] on May 08, 2010.  It found stricture in the distal esophagus (dilation performed at that time) as well as a 5 cm hiatal hernia.  Past Medical History:  Diagnosis Date  . Anxiety state, unspecified   . Arthritis   . Benign neoplasm of colon   . CAD (coronary artery disease) of artery bypass graft 05/20/2018  . Coronary atherosclerosis of unspecified type of vessel, native or graft   . Depressive disorder, not elsewhere classified   . Diaphragmatic hernia without mention of obstruction or gangrene   . Diverticulosis of colon (without mention of hemorrhage)   . Elevated troponin 05/20/2018  . Epigastric pain 05/20/2018  . Esophageal reflux   . Esophageal stricture   . Hiatal hernia   . Mitral regurgitation   . Osteoarthrosis, unspecified whether generalized or localized, unspecified site   . Other and unspecified hyperlipidemia   . Parkinson's  disease ([place])   . Personal history of colonic polyps   . Personal history of unspecified circulatory disease   . Unspecified essential hypertension     Patient Active Problem List   Diagnosis Date Noted  . Elevated troponin 05/20/2018  . Epigastric pain 05/20/2018  . CAD (coronary artery disease) of artery bypass graft 05/20/2018  . Acute cholecystitis 05/20/2018  . Subacute left lumbar radiculopathy 06/23/2015  . Mitral regurgitation 09/17/2014  . Parkinson's disease ([place]) 08/19/2014  . Pulmonary hypertension ([place]) 03/30/2014  . Piriformis syndrome of left side 03/02/2014  . Peripheral edema 01/15/2014  . Movement disorder 01/15/2014  . Hypercalcemia 01/15/2014  . Left hip pain 01/15/2014  . Hoarseness 01/15/2014  . Back pain 06/08/2010  . Encounter for long-term (current) use of high-risk medication 06/08/2010  . ED (erectile dysfunction) 06/08/2010  . Preventative health care 06/07/2010  . SHOULDER PAIN, LEFT 08/30/2009  . CAD, AUTOLOGOUS BYPASS GRAFT 03/31/2009  . CAROTID ARTERY DISEASE 01/25/2009  . BRADYCARDIA 10/08/2007  . COLONIC POLYPS 03/20/2007  . HIATAL HERNIA 03/20/2007  . DIVERTICULOSIS, COLON 03/20/2007  . Hyperlipidemia 09/19/2006  . OBESITY 09/19/2006  . Anxiety state 09/19/2006  . ERECTILE DYSFUNCTION 09/19/2006  . Depression 09/19/2006  . Essential hypertension 09/19/2006  . Coronary atherosclerosis 09/19/2006  . ALLERGIC RHINITIS 09/19/2006  . GERD 09/19/2006  . OSTEOARTHRITIS 09/19/2006  . TRANSIENT ISCHEMIC ATTACK, HX OF 09/19/2006  . PSORIASIS, HX OF 09/19/2006    Past Surgical History:  Procedure Laterality Date  .  COLONOSCOPY    . CORONARY ARTERY BYPASS GRAFT     x 2  . INGUINAL HERNIA REPAIR     bilateral  . LUNG BIOPSY    . POLYPECTOMY    . PTCA          Home Medications    Prior to Admission medications   Medication Sig Start Date End Date Taking? Authorizing Provider  aspirin 81 MG tablet Take 81 mg by mouth daily.     Yes  [provider]  B Complex Vitamins (VITAMIN B COMPLEX PO) Take 1 tablet by mouth every other day.    Yes [provider]  carbidopa-levodopa (SINEMET IR) 25-100 MG tablet Take 1 tablet by mouth 3 (three) times daily. 05/06/18  Yes Tat, Eustace Quail, DO  clopidogrel (PLAVIX) 75 MG tablet TAKE 1 TABLET EVERY DAY WITH BREAKFAST Patient taking differently: Take 75 mg by mouth daily.  04/29/18  Yes Biagio Borg, MD  esomeprazole (NEXIUM) 40 MG capsule Take 1 capsule (40 mg total) by mouth daily. 05/06/17  Yes Biagio Borg, MD  lovastatin (MEVACOR) 20 MG tablet TAKE 1 TABLET AT BEDTIME Patient taking differently: Take 20 mg by mouth at bedtime.  04/29/18  Yes Biagio Borg, MD  NITROSTAT 0.4 MG SL tablet PLACE 1 TABLET (0.4 MG TOTAL) UNDER THE TONGUE EVERY 5 (FIVE) MINUTES AS NEEDED. Patient taking differently: Place 0.4 mg under the tongue every 5 (five) minutes as needed for chest pain.  09/21/14  Yes Minus Breeding, MD  Omega-3 Fatty Acids (FISH OIL) 1000 MG CAPS Take 1,000-2,000 mg by mouth 2 (two) times a week.    Yes [provider]  tetrahydrozoline-zinc (VISINE-AC) 0.05-0.25 % ophthalmic solution Place 2 drops into both eyes 3 (three) times daily as needed (for burning or eye redness).   Yes [provider]  tiZANidine (ZANAFLEX) 2 MG tablet Take 1 tablet (2 mg total) by mouth every 6 (six) hours as needed for muscle spasms. 11/12/17  Yes Biagio Borg, MD  traMADol (ULTRAM) 50 MG tablet Take 1 tablet (50 mg total) by mouth every 6 (six) hours as needed. for pain Patient taking differently: Take 50 mg by mouth every 6 (six) hours as needed (for pain).  05/13/18  Yes Biagio Borg, MD  vitamin E 400 UNIT capsule Take 400 Units by mouth 2 (two) times a week.    Yes [provider]    Family History Family History  Problem Relation Age of Onset  . Heart attack Mother   . Stroke Father   . Colon cancer Sister   . Healthy Sister   . Heart disease Brother    . Healthy Brother   . Healthy Brother   . Healthy Brother   . Healthy Daughter   . Fibromyalgia Daughter   . Multiple sclerosis Son     Social History Social History   Tobacco Use  . Smoking status: Former Smoker    Last attempt to quit: 02/19/1972    Years since quitting: 46.2  . Smokeless tobacco: Never Used  Substance Use Topics  . Alcohol use: No    Alcohol/week: 0.0 standard drinks  . Drug use: No     Allergies   Atorvastatin and Simvastatin   Review of Systems Review of Systems  Constitutional: Negative for chills, diaphoresis and fever.  Respiratory: Negative for cough and shortness of breath.   Cardiovascular: Negative for chest pain, palpitations and leg swelling.  Gastrointestinal: Positive for abdominal pain.  Negative for blood in stool, constipation, diarrhea, nausea and vomiting.  Genitourinary: Negative for dysuria, flank pain and hematuria.  Musculoskeletal: Negative for back pain.  Neurological: Negative for dizziness, weakness, light-headedness and numbness.  All other systems reviewed and are negative.    Physical Exam Updated Vital Signs BP (!) 156/74 (BP Location: Right Arm)   Pulse 72   Temp 98.1 F (36.7 C) (Oral)   Resp 14   Ht 5\' 4"  (1.626 m)   Wt 66.7 kg   SpO2 97%   BMI 25.23 kg/m   Physical Exam Vitals signs and nursing note reviewed.  Constitutional:      General: He is not in acute distress.    Appearance: He is well-developed. He is not diaphoretic.  HENT:     Head: Normocephalic and atraumatic.     Mouth/Throat:     Mouth: Mucous membranes are moist.     Pharynx: Oropharynx is clear.  Eyes:     Conjunctiva/sclera: Conjunctivae normal.  Neck:     Musculoskeletal: Neck supple.  Cardiovascular:     Rate and Rhythm: Normal rate and regular rhythm.     Pulses: Normal pulses.          Radial pulses are 2+ on the right side and 2+ on the left side.       Posterior tibial pulses are 2+ on the right side and 2+ on the left  side.     Heart sounds: Normal heart sounds.     Comments: Tactile temperature in the extremities appropriate and equal bilaterally. Pulmonary:     Effort: Pulmonary effort is normal. No respiratory distress.     Breath sounds: Normal breath sounds.  Abdominal:     Palpations: Abdomen is soft.     Tenderness: There is abdominal tenderness in the epigastric area. There is no guarding.  Musculoskeletal:     Right lower leg: No edema.     Left lower leg: No edema.  Lymphadenopathy:     Cervical: No cervical adenopathy.  Skin:    General: Skin is warm and dry.  Neurological:     Mental Status: He is alert.     Comments: Sensation grossly intact to light touch in the extremities.  Grip strengths equal bilaterally.  Strength 5/5 in all extremities. No gait disturbance. Coordination intact. Cranial nerves III-XII grossly intact. No facial droop.   Psychiatric:        Mood and Affect: Mood and affect normal.        Speech: Speech normal.        Behavior: Behavior normal.      ED Treatments / Results  Labs (all labs ordered are listed, but only abnormal results are displayed) Labs Reviewed  COMPREHENSIVE METABOLIC PANEL - Abnormal; Notable for the following components:      Result Value   Glucose, Bld 121 (*)    All other components within normal limits  TROPONIN I - Abnormal; Notable for the following components:   Troponin I 0.06 (*)    All other components within normal limits  CBC WITH DIFFERENTIAL/PLATELET - Abnormal; Notable for the following components:   WBC 16.1 (*)    Neutro Abs 14.3 (*)    Lymphs Abs 0.6 (*)    Monocytes Absolute 1.1 (*)    All other components within normal limits  TROPONIN I - Abnormal; Notable for the following components:   Troponin I 0.05 (*)    All other components within normal limits  SARS CORONAVIRUS  2 (HOSPITAL ORDER, [facility])  URINALYSIS, ROUTINE W REFLEX MICROSCOPIC  LIPASE, BLOOD  BASIC METABOLIC PANEL   CBC  HEPATIC FUNCTION PANEL  TROPONIN I  TROPONIN I  TROPONIN I    EKG EKG Interpretation  Date/Time:  Tuesday May 20 2018 13:40:35 EDT Ventricular Rate:  71 PR Interval:    QRS Duration: 103 QT Interval:  553 QTC Calculation: 602 R Axis:   110 Text Interpretation:  Sinus rhythm Right axis deviation Nonspecific repol abnormality, diffuse leads Prolonged QT interval Confirmed by Malvin Johns [phone]) on 05/20/2018 2:08:40 PM   Radiology Dg Chest 2 View  Result Date: 05/20/2018 CLINICAL DATA:  Epigastric pain.  History of hiatal hernia. EXAM: CHEST - 2 VIEW COMPARISON:  CT chest 01/27/2014.  PA and lateral chest 01/15/2014. FINDINGS: The patient is status post CABG. There is cardiomegaly and atherosclerosis. Lungs are clear. Moderate hiatal hernia is identified and appears increased in size since the prior study. No acute bony abnormality. IMPRESSION: Moderate hiatal hernia appears somewhat increased compared to the prior exams. Cardiomegaly without edema. Atherosclerosis. Electronically Signed   By: Inge Rise M.D.   On: 05/20/2018 15:36   Ct Abdomen Pelvis W Contrast  Result Date: 05/20/2018 CLINICAL DATA:  Initial evaluation for acute upper abdominal pain. EXAM: CT ABDOMEN AND PELVIS WITH CONTRAST TECHNIQUE: Multidetector CT imaging of the abdomen and pelvis was performed using the standard protocol following bolus administration of intravenous contrast. CONTRAST:  137mL OMNIPAQUE IOHEXOL 300 MG/ML  SOLN COMPARISON:  None available. FINDINGS: Lower chest: Scattered atelectatic changes seen dependently within the visualized lung bases, left greater than right. Visualized lungs are otherwise clear. Cardiomegaly with 3 vessel coronary artery calcifications partially visualized. Hepatobiliary: Liver demonstrates a normal contrast enhanced appearance. Focal fat deposition noted adjacent to the falciform ligament. Subcentimeter radiopaque density noted within the gallbladder lumen,  compatible with cholelithiasis. Gallbladder itself is somewhat distended with hazy pericholecystic fat stranding and mucosal enhancement, raising the possibility for possible acute cholecystitis. No significant common bile duct dilatation. Mild intrahepatic biliary dilatation. Pancreas: Pancreas within normal limits. No abnormal pancreatic ductal dilatation. Spleen: Spleen within normal limits. Adrenals/Urinary Tract: Adrenal glands are normal. Left kidney mildly atrophic as compared to the right. Symmetric enhancement seen within the kidneys bilaterally. No nephrolithiasis, hydronephrosis or focal enhancing renal mass. No hydroureter. Bladder within normal limits. Stomach/Bowel: Large hiatal hernia. Stomach otherwise unremarkable. No evidence for bowel obstruction. Normal appendix. Colonic diverticulosis without evidence for acute diverticulitis. No acute inflammatory changes seen about the bowels. Moderate to large volume retained stool within the distal colon, suggesting constipation. Vascular/Lymphatic: Advanced atherosclerotic change throughout the intra-abdominal aorta and its branch vessels. No aneurysm. Mesenteric vessels grossly patent proximally. No adenopathy. Reproductive: Prostate mildly enlarged measuring 5 cm in transverse diameter. Other: No free air or fluid. Musculoskeletal: No acute osseous abnormality. No discrete lytic or blastic osseous lesions. Moderate thoracolumbar levoscoliosis, apex at L2. IMPRESSION: 1. Cholelithiasis with hazy pericholecystic fat stranding and mucosal enhancement raising the possibility for acute cholecystitis. Correlation with laboratory values recommended. Additionally, further evaluation with dedicated right upper quadrant ultrasound could be performed as clinically warranted. 2. Large hiatal hernia. 3. Moderate to large volume retained stool within the distal colon, suggesting constipation. 4. Colonic diverticulosis without evidence for acute diverticulitis. 5.  Advanced atherosclerosis with 3 vessel coronary artery calcifications. Electronically Signed   By: Jeannine Boga M.D.   On: 05/20/2018 17:22   US Abdomen Limited Ruq  Result Date: 05/20/2018 CLINICAL DATA:  Upper abdominal/epigastric pain EXAM: ULTRASOUND ABDOMEN LIMITED RIGHT UPPER QUADRANT COMPARISON:  CT abdomen and pelvis May 20, 2018 FINDINGS: Gallbladder: Within the gallbladder, there are echogenic foci which move and shadow consistent with cholelithiasis. Largest gallstone measures 1.1 cm in length. The gallbladder wall is thickened and subtly edematous. There is no appreciable pericholecystic fluid. No sonographic Murphy sign noted by sonographer. Common bile duct: Diameter: 6 mm. No intrahepatic or extrahepatic biliary duct dilatation. Liver: No focal lesion identified. Within normal limits in parenchymal echogenicity. Portal vein is patent on color Doppler imaging with normal direction of blood flow towards the liver. IMPRESSION: Cholelithiasis. Gallbladder wall mildly thickened and subtly edematous. Suspect a degree of acute cholecystitis given this appearance. Study otherwise unremarkable. Electronically Signed   By: Lowella Grip III M.D.   On: 05/20/2018 19:15    Procedures Procedures (including critical care time)  Medications Ordered in ED Medications  piperacillin-tazobactam (ZOSYN) IVPB 3.375 g (3.375 g Intravenous New Bag/Given 05/20/18 2033)  acetaminophen (TYLENOL) tablet 650 mg (has no administration in time range)    Or  acetaminophen (TYLENOL) suppository 650 mg (has no administration in time range)  dextrose 5 %-0.9 % sodium chloride infusion (has no administration in time range)  piperacillin-tazobactam (ZOSYN) IVPB 3.375 g (has no administration in time range)  alum & mag hydroxide-simeth (MAALOX/MYLANTA) 200-200-20 MG/5ML suspension 30 mL (30 mLs Oral Given 05/20/18 1445)    And  lidocaine (XYLOCAINE) 2 % viscous mouth solution 15 mL (15 mLs Oral Given 05/20/18  1445)  famotidine (PEPCID) IVPB 20 mg premix (0 mg Intravenous Stopped 05/20/18 1655)  aspirin chewable tablet 324 mg (324 mg Oral Given 05/20/18 1725)  morphine 2 MG/ML injection 2 mg (2 mg Intravenous Given 05/20/18 1714)  iohexol (OMNIPAQUE) 300 MG/ML solution 100 mL (100 mLs Intravenous Contrast Given 05/20/18 1650)  sodium chloride 0.9 % bolus 1,000 mL (1,000 mLs Intravenous New Bag/Given 05/20/18 2034)     Initial Impression / Assessment and Plan / ED Course  I have reviewed the triage vital signs and the nursing notes.  Pertinent labs & imaging results that were available during my care of the patient were reviewed by me and considered in my medical decision making (see chart for details).  Clinical Course as of May 19 2053  Tue May 20, 2018  1632 Discussed positive troponin as well as other labs and imaging. Discussed need for admission.  States his pain is currently 7/10.   [SJ]  6144 Spoke with Suanne Marker with cardiology. They will have someone come see the patient.   [SJ]  3154 MGQQP with Suanne Marker. She and Dr. Irish Lack saw the patient. Based on CT finding of possible cholecystitis, she states they will continue to consult on the patient, but a Medicine admission would likely be more appropriate day   [SJ]  1940 Spoke with Dr. Grandville Silos, general surgeon. Agrees with Medicine admission. He will come see the patient.   [SJ]  2004 Spoke with Dr. Hal Hope, hospitalist. Agrees to admit the patient.   [SJ]    Clinical Course User Index [SJ] Schwanda Zima C, PA-C       Patient presents with epigastric pain. He is nontoxic appearing, afebrile, not tachycardic, not tachypneic, not hypotensive, maintains excellent SPO2 on room air, and is in no apparent distress.   Troponin was noted to be mildly elevated at 0.06.  Cardiology consulted and does not suspect NSTEMI or other actionable issue at this time. Advise repeat troponin to check for rise in value.  Second troponin shows slight decrease  at 0.05. EKG without evidence of acute ischemia or pathologic/symptomatic arrhythmia. Dissection was considered, but thought less likely base on: History and description of the pain are not suggestive, patient is not ill-appearing, lack of risk factors, equal bilateral pulses, lack of neurologic deficits, no widened mediastinum on chest x-ray.  CT shows evidence of cholecystitis and cholelithiasis, confirmed with ultrasound.  LFTs, bilirubin, and lipase unremarkable.  Patient does have a mild leukocytosis at 16.1.  Antibiotics and fluids initiated.  General surgery will evaluate the patient for surgery.  Patient admitted via medicine service.  Coronavirus test pending at time of admission.   Findings and plan of care discussed with Malvin Johns, MD. Dr. Tamera Punt personally evaluated and examined this patient.  GREOGORY CORNETTE was evaluated in Emergency Department on 05/20/2018 for the symptoms described in the history of present illness. He was evaluated in the context of the global COVID-19 pandemic, which necessitated consideration that the patient might be at risk for infection with the SARS-CoV-2 virus that causes COVID-19. Institutional protocols and algorithms that pertain to the evaluation of patients at risk for COVID-19 are in a state of rapid change based on information released by regulatory bodies including the CDC and federal and state organizations. These policies and algorithms were followed during the patient's care in the ED.  Vitals:   05/20/18 1327 05/20/18 1335 05/20/18 1345 05/20/18 1415  BP: (!) 156/74  (!) 170/76 (!) 160/70  Pulse: 72  69 73  Resp: 14  16 20   Temp:      TempSrc:      SpO2: 97%  93% 92%  Weight:  66.7 kg    Height:  5\' 4"  (1.626 m)       Final Clinical Impressions(s) / ED Diagnoses   Final diagnoses:  Epigastric pain  Prolonged QT interval  Cholecystitis    ED Discharge Orders    None       Layla Maw 05/20/18 2055    Malvin Johns, MD 05/21/18 1110

## 2018-05-21 DIAGNOSIS — K810 Acute cholecystitis: Principal | ICD-10-CM

## 2018-05-21 DIAGNOSIS — I25810 Atherosclerosis of coronary artery bypass graft(s) without angina pectoris: Secondary | ICD-10-CM

## 2018-05-21 DIAGNOSIS — Z01810 Encounter for preprocedural cardiovascular examination: Secondary | ICD-10-CM

## 2018-05-21 LAB — CBC
HCT: 42.2 % (ref 39.0–52.0)
Hemoglobin: 14.3 g/dL (ref 13.0–17.0)
MCH: 30.6 pg (ref 26.0–34.0)
MCHC: 33.9 g/dL (ref 30.0–36.0)
MCV: 90.4 fL (ref 80.0–100.0)
Platelets: 209 10*3/uL (ref 150–400)
RBC: 4.67 MIL/uL (ref 4.22–5.81)
RDW: 13.9 % (ref 11.5–15.5)
WBC: 16.8 10*3/uL — ABNORMAL HIGH (ref 4.0–10.5)
nRBC: 0 % (ref 0.0–0.2)

## 2018-05-21 LAB — BASIC METABOLIC PANEL
Anion gap: 10 (ref 5–15)
BUN: 13 mg/dL (ref 8–23)
CO2: 25 mmol/L (ref 22–32)
Calcium: 9.4 mg/dL (ref 8.9–10.3)
Chloride: 103 mmol/L (ref 98–111)
Creatinine, Ser: 1.08 mg/dL (ref 0.61–1.24)
GFR calc Af Amer: >60 mL/min (ref >60)
GFR calc non Af Amer: >60 mL/min (ref >60)
Glucose, Bld: 124 mg/dL — ABNORMAL HIGH (ref 70–99)
Potassium: 3.8 mmol/L (ref 3.5–5.1)
Sodium: 138 mmol/L (ref 135–145)

## 2018-05-21 LAB — HEPATIC FUNCTION PANEL
ALT: 26 U/L (ref 0–44)
AST: 20 U/L (ref 15–41)
Albumin: 3.1 g/dL — ABNORMAL LOW (ref 3.5–5.0)
Alkaline Phosphatase: 58 U/L (ref 38–126)
Bilirubin, Direct: 0.3 mg/dL — ABNORMAL HIGH (ref 0.0–0.2)
Indirect Bilirubin: 0.5 mg/dL (ref 0.3–0.9)
Total Bilirubin: 0.8 mg/dL (ref 0.3–1.2)
Total Protein: 5.9 g/dL — ABNORMAL LOW (ref 6.5–8.1)

## 2018-05-21 LAB — TROPONIN I
Troponin I: 0.06 ng/mL (ref <0.03)
Troponin I: 0.06 ng/mL (ref <0.03)
Troponin I: 0.05 ng/mL (ref <0.03)

## 2018-05-21 LAB — GLUCOSE, CAPILLARY
Glucose-Capillary: 103 mg/dL — ABNORMAL HIGH (ref 70–99)
Glucose-Capillary: 111 mg/dL — ABNORMAL HIGH (ref 70–99)
Glucose-Capillary: 118 mg/dL — ABNORMAL HIGH (ref 70–99)
Glucose-Capillary: 127 mg/dL — ABNORMAL HIGH (ref 70–99)

## 2018-05-21 MED ORDER — SODIUM CHLORIDE 0.9 % IV SOLN
INTRAVENOUS | Status: DC | PRN
  Administered 2018-05-21: 250 mL via INTRAVENOUS

## 2018-05-21 MED ORDER — DOCUSATE SODIUM 100 MG PO CAPS
100.0000 mg | ORAL_CAPSULE | Freq: Two times a day (BID) | ORAL | Status: DC
  Administered 2018-05-21 – 2018-05-24 (×7): 100 mg via ORAL
  Filled 2018-05-21 (×7): qty 1

## 2018-05-21 MED ORDER — OXYCODONE HCL 5 MG PO TABS
5.0000 mg | ORAL_TABLET | ORAL | Status: DC | PRN
  Administered 2018-05-21 – 2018-05-22 (×4): 10 mg via ORAL
  Filled 2018-05-21 (×5): qty 2

## 2018-05-21 MED ORDER — MORPHINE SULFATE (PF) 2 MG/ML IV SOLN
2.0000 mg | INTRAVENOUS | Status: DC | PRN
  Administered 2018-05-21 – 2018-05-23 (×4): 2 mg via INTRAVENOUS
  Filled 2018-05-21 (×4): qty 1

## 2018-05-21 NOTE — Plan of Care (Signed)
  Problem: Clinical Measurements: Goal: Ability to maintain clinical measurements within normal limits will improve Outcome: Progressing   Problem: Coping: Goal: Level of anxiety will decrease Outcome: Progressing   Problem: Nutrition: Goal: Adequate nutrition will be maintained Outcome: Progressing   Problem: Pain Managment: Goal: General experience of comfort will improve Outcome: Progressing   Problem: Safety: Goal: Ability to remain free from injury will improve Outcome: Progressing

## 2018-05-21 NOTE — Progress Notes (Signed)
Subjective: CC: Abdominal Pain Patient reports "soreness" in his epigastrium without radiation that he rates as a 5/10. First came on after breathing exercises. Denies fever, chills, N/V/D. Last BM Saturday. Passing flatus. Notes he has parkinson's. Walks without cane or walker at baseline. Lives at home with wife and son. Last dose of Plavix reported AM of 5/12. No CP or SOB.   Objective: Vital signs in last 24 hours: Temp:  [98.1 F (36.7 C)-99.2 F (37.3 C)] 99.2 F (37.3 C) (05/13 0618) Pulse Rate:  [69-82] 71 (05/13 0618) Resp:  [14-22] 17 (05/13 0618) BP: (147-186)/(69-82) 147/70 (05/13 0618) SpO2:  [92 %-99 %] 94 % (05/13 0618) Weight:  [65.3 kg-66.7 kg] 65.3 kg (05/13 0618) Last BM Date: 05/17/18  Intake/Output from previous day: 05/12 0701 - 05/13 0700 In: 0  Out: 250 [Urine:250] Intake/Output this shift: No intake/output data recorded.  PE: Gen: Awake and alert, NAD Heart: Regular Lungs: Normal effort, CTA b/l.  Abd: Soft, ND, epigastric and RUQ tenderness. +BS   Lab Results:  Recent Labs    05/20/18 1453 05/21/18 0407  WBC 16.1* 16.8*  HGB 14.5 14.3  HCT 42.7 42.2  PLT 213 209   BMET Recent Labs    05/20/18 1453 05/21/18 0407  NA 138 138  K 4.0 3.8  CL 103 103  CO2 23 25  GLUCOSE 121* 124*  BUN 11 13  CREATININE 1.05 1.08  CALCIUM 9.9 9.4   PT/INR No results for input(s): LABPROT, INR in the last 72 hours. CMP     Component Value Date/Time   NA 138 05/21/2018 0407   K 3.8 05/21/2018 0407   CL 103 05/21/2018 0407   CO2 25 05/21/2018 0407   GLUCOSE 124 (H) 05/21/2018 0407   BUN 13 05/21/2018 0407   CREATININE 1.08 05/21/2018 0407   CALCIUM 9.4 05/21/2018 0407   PROT 5.9 (L) 05/21/2018 0407   ALBUMIN 3.1 (L) 05/21/2018 0407   AST 20 05/21/2018 0407   ALT 26 05/21/2018 0407   ALKPHOS 58 05/21/2018 0407   BILITOT 0.8 05/21/2018 0407   GFRNONAA >60 05/21/2018 0407   GFRAA >60 05/21/2018 0407   Lipase     Component Value  Date/Time   LIPASE 26 05/20/2018 1453       Studies/Results: Dg Chest 2 View  Result Date: 05/20/2018 CLINICAL DATA:  Epigastric pain.  History of hiatal hernia. EXAM: CHEST - 2 VIEW COMPARISON:  CT chest 01/27/2014.  PA and lateral chest 01/15/2014. FINDINGS: The patient is status post CABG. There is cardiomegaly and atherosclerosis. Lungs are clear. Moderate hiatal hernia is identified and appears increased in size since the prior study. No acute bony abnormality. IMPRESSION: Moderate hiatal hernia appears somewhat increased compared to the prior exams. Cardiomegaly without edema. Atherosclerosis. Electronically Signed   By: Inge Rise M.D.   On: 05/20/2018 15:36   Ct Abdomen Pelvis W Contrast  Result Date: 05/20/2018 CLINICAL DATA:  Initial evaluation for acute upper abdominal pain. EXAM: CT ABDOMEN AND PELVIS WITH CONTRAST TECHNIQUE: Multidetector CT imaging of the abdomen and pelvis was performed using the standard protocol following bolus administration of intravenous contrast. CONTRAST:  14mL OMNIPAQUE IOHEXOL 300 MG/ML  SOLN COMPARISON:  None available. FINDINGS: Lower chest: Scattered atelectatic changes seen dependently within the visualized lung bases, left greater than right. Visualized lungs are otherwise clear. Cardiomegaly with 3 vessel coronary artery calcifications partially visualized. Hepatobiliary: Liver demonstrates a normal contrast enhanced appearance. Focal fat deposition noted adjacent  to the falciform ligament. Subcentimeter radiopaque density noted within the gallbladder lumen, compatible with cholelithiasis. Gallbladder itself is somewhat distended with hazy pericholecystic fat stranding and mucosal enhancement, raising the possibility for possible acute cholecystitis. No significant common bile duct dilatation. Mild intrahepatic biliary dilatation. Pancreas: Pancreas within normal limits. No abnormal pancreatic ductal dilatation. Spleen: Spleen within normal limits.  Adrenals/Urinary Tract: Adrenal glands are normal. Left kidney mildly atrophic as compared to the right. Symmetric enhancement seen within the kidneys bilaterally. No nephrolithiasis, hydronephrosis or focal enhancing renal mass. No hydroureter. Bladder within normal limits. Stomach/Bowel: Large hiatal hernia. Stomach otherwise unremarkable. No evidence for bowel obstruction. Normal appendix. Colonic diverticulosis without evidence for acute diverticulitis. No acute inflammatory changes seen about the bowels. Moderate to large volume retained stool within the distal colon, suggesting constipation. Vascular/Lymphatic: Advanced atherosclerotic change throughout the intra-abdominal aorta and its branch vessels. No aneurysm. Mesenteric vessels grossly patent proximally. No adenopathy. Reproductive: Prostate mildly enlarged measuring 5 cm in transverse diameter. Other: No free air or fluid. Musculoskeletal: No acute osseous abnormality. No discrete lytic or blastic osseous lesions. Moderate thoracolumbar levoscoliosis, apex at L2. IMPRESSION: 1. Cholelithiasis with hazy pericholecystic fat stranding and mucosal enhancement raising the possibility for acute cholecystitis. Correlation with laboratory values recommended. Additionally, further evaluation with dedicated right upper quadrant ultrasound could be performed as clinically warranted. 2. Large hiatal hernia. 3. Moderate to large volume retained stool within the distal colon, suggesting constipation. 4. Colonic diverticulosis without evidence for acute diverticulitis. 5. Advanced atherosclerosis with 3 vessel coronary artery calcifications. Electronically Signed   By: Jeannine Boga M.D.   On: 05/20/2018 17:22   US Abdomen Limited Ruq  Result Date: 05/20/2018 CLINICAL DATA:  Upper abdominal/epigastric pain EXAM: ULTRASOUND ABDOMEN LIMITED RIGHT UPPER QUADRANT COMPARISON:  CT abdomen and pelvis May 20, 2018 FINDINGS: Gallbladder: Within the gallbladder,  there are echogenic foci which move and shadow consistent with cholelithiasis. Largest gallstone measures 1.1 cm in length. The gallbladder wall is thickened and subtly edematous. There is no appreciable pericholecystic fluid. No sonographic Murphy sign noted by sonographer. Common bile duct: Diameter: 6 mm. No intrahepatic or extrahepatic biliary duct dilatation. Liver: No focal lesion identified. Within normal limits in parenchymal echogenicity. Portal vein is patent on color Doppler imaging with normal direction of blood flow towards the liver. IMPRESSION: Cholelithiasis. Gallbladder wall mildly thickened and subtly edematous. Suspect a degree of acute cholecystitis given this appearance. Study otherwise unremarkable. Electronically Signed   By: Lowella Grip III M.D.   On: 05/20/2018 19:15    Anti-infectives: Anti-infectives (From admission, onward)   Start     Dose/Rate Route Frequency Ordered Stop   05/21/18 0400  piperacillin-tazobactam (ZOSYN) IVPB 3.375 g     3.375 g 12.5 mL/hr over 240 Minutes Intravenous Every 8 hours 05/20/18 2017     05/20/18 2000  piperacillin-tazobactam (ZOSYN) IVPB 3.375 g     3.375 g 100 mL/hr over 30 Minutes Intravenous  Once 05/20/18 1947 05/20/18 2346       Assessment/Plan CAD s/p CABG 2006 - on Plavix and ASA. Hold Plavix  Elevated Tn HTN HLD Parkinsons  Cholecystitis with cholelithiasis - LFTs, T bili and Lipase wnl - Plan to hold Plavix for 5 days  - Will need medical and cardiac clearance for possible Lap Chole during admission - Continue IV abx  - Pain medication ordered - Mobilize as tolerated. IS/Pulm Toilet.   FEN - CLD, IVF, Colace, K 3.8 VTE - SCDs, hold Plavix. Okay for Lovenox or Heparin  from surgical standpoint. ID - Zosyn 5/12 >> WBC 16.1->16.8 POA - Wife, Keldrick Pomplun [phone])    LOS: 1 day    Jillyn Ledger , [facility] 05/21/2018, 7:58 AM Pager: [phone]

## 2018-05-21 NOTE — Progress Notes (Signed)
PROGRESS NOTE  Brandon Rojas NGE:[id number] DOB: [birth year] DOA: 05/20/2018 PCP: Biagio Borg, MD  HPI/Recap of past 24 hours: Brandon Rojas is a 73 y.o. male with story of CAD status post CABG, hypertension, Parkinson's disease presents to the ER with complaints of epigastric pain ongoing for last 2 days.  Patient initially thought it was due to his hiatal hernia.  Denies any associated vomiting or diarrhea denies any fever chills.  Denies any chest pain.  ED Course: In the ER on exam patient has right upper quadrant tenderness.  CT abdomen followed by ultrasound shows features concerning for acute cholecystitis.  WBC count is elevated LFTs are normal.  On-call general surgeon was consulted.  Started on empiric antibiotics.  Given history of CAD troponin was done which shows mild elevation.  Cardiology was consulted.  At the time of my exam patient is tender in the abdomen denies any chest pain.  05/21/18: Patient seen and examined at bedside.  He reports his right upper quadrant abdominal pain is improved. Mild elevation in his troponin.  Denies any chest pain or dyspnea at rest.  General surgery following and awaiting cardiology clearance.  Assessment/Plan: Principal Problem:   Acute cholecystitis Active Problems:   Parkinson's disease (HCC)   Elevated troponin   Epigastric pain   CAD (coronary artery disease) of artery bypass graft  Acute cholecystitis, poa General surgery following Mild elevation of his troponin, change surgery awaiting cardiology clearance  Leukocytosis is persistent T-max 99.2 overnight Continue Zosyn  Elevated troponin Peaked at 0.06 and trending down to 0.05 Denies chest pain  Uncontrolled hypertension Possibly secondary to pain Not on antihypertensive medications at home  Hyperlipidemia Lorvastatin is being held  Parkinson's disease Continue home medications when no longer n.p.o.  DVT prophylaxis: SCDs in anticipation of surgery. Code Status:  Full code. Family Communication: Discussed with patient. Disposition Plan: Home when surgery signs off Consults called: Cardiology and surgery. Admission status: Inpatient.    Objective: Vitals:   05/20/18 1815 05/20/18 1830 05/20/18 2142 05/21/18 0618  BP: (!) 149/81 (!) 159/82 (!) 186/79 (!) 147/70  Pulse: 72 71 74 71  Resp: 14 15 19 17   Temp:   99.2 F (37.3 C) 99.2 F (37.3 C)  TempSrc:   Oral Oral  SpO2: 94% 98% 99% 94%  Weight:    65.3 kg  Height:        Intake/Output Summary (Last 24 hours) at 05/21/2018 1219 Last data filed at 05/21/2018 0730 Gross per 24 hour  Intake 0 ml  Output 250 ml  Net -250 ml   Filed Weights   05/20/18 1335 05/21/18 0618  Weight: 66.7 kg 65.3 kg    Exam:   General: 73 y.o. year-old male well developed well nourished in no acute distress.  Alert and oriented x3.  Cardiovascular: Regular rate and rhythm with no rubs or gallops.  No thyromegaly or JVD noted.    Respiratory: Clear to auscultation with no wheezes or rales. Good inspiratory effort.  Abdomen: Mild right upper quadrant abdominal tenderness on palpation.  Soft nondistended with normal bowel sounds x4 quadrants.  Psychiatry: Mood is appropriate for condition and setting   Data Reviewed: CBC: Recent Labs  Lab 05/15/18 1057 05/20/18 1453 05/21/18 0407  WBC 8.8 16.1* 16.8*  NEUTROABS 5.9 14.3*  --   HGB 15.3 14.5 14.3  HCT 45.0 42.7 42.2  MCV 91.1 90.5 90.4  PLT 230.0 213 401   Basic Metabolic Panel: Recent Labs  Lab 05/15/18  1057 05/20/18 1453 05/21/18 0407  NA 139 138 138  K 4.6 4.0 3.8  CL 104 103 103  CO2 28 23 25   GLUCOSE 89 121* 124*  BUN 17 11 13   CREATININE 1.07 1.05 1.08  CALCIUM 9.5 9.9 9.4   GFR: Estimated Creatinine Clearance: 50.2 mL/min (by C-G formula based on SCr of 1.08 mg/dL). Liver Function Tests: Recent Labs  Lab 05/15/18 1057 05/20/18 1453 05/21/18 0407  AST 22 23 20   ALT 4 8 26   ALKPHOS 65 60 58  BILITOT 0.6 0.9 0.8  PROT  6.6 6.5 5.9*  ALBUMIN 4.1 3.6 3.1*   Recent Labs  Lab 05/20/18 1453  LIPASE 26   No results for input(s): AMMONIA in the last 168 hours. Coagulation Profile: No results for input(s): INR, PROTIME in the last 168 hours. Cardiac Enzymes: Recent Labs  Lab 05/20/18 1453 05/20/18 1950 05/20/18 2212 05/21/18 0407 05/21/18 0943  TROPONINI 0.06* 0.05* 0.06* 0.06* 0.05*   BNP (last 3 results) No results for input(s): PROBNP in the last 8760 hours. HbA1C: No results for input(s): HGBA1C in the last 72 hours. CBG: Recent Labs  Lab 05/21/18 0004 05/21/18 0614  GLUCAP 127* 111*   Lipid Profile: No results for input(s): CHOL, HDL, LDLCALC, TRIG, CHOLHDL, LDLDIRECT in the last 72 hours. Thyroid Function Tests: No results for input(s): TSH, T4TOTAL, FREET4, T3FREE, THYROIDAB in the last 72 hours. Anemia Panel: No results for input(s): VITAMINB12, FOLATE, FERRITIN, TIBC, IRON, RETICCTPCT in the last 72 hours. Urine analysis:    Component Value Date/Time   COLORURINE YELLOW 05/20/2018 [place] 05/20/2018 1443   LABSPEC 1.010 05/20/2018 1443   PHURINE 5.0 05/20/2018 1443   GLUCOSEU NEGATIVE 05/20/2018 1443   GLUCOSEU NEGATIVE 05/15/2018 1057   HGBUR NEGATIVE 05/20/2018 1443   BILIRUBINUR NEGATIVE 05/20/2018 1443   KETONESUR NEGATIVE 05/20/2018 1443   PROTEINUR NEGATIVE 05/20/2018 1443   UROBILINOGEN 0.2 05/15/2018 1057   NITRITE NEGATIVE 05/20/2018 1443   LEUKOCYTESUR NEGATIVE 05/20/2018 1443   Sepsis Labs: @LABRCNTIP (procalcitonin:4,lacticidven:4)  ) Recent Results (from the past 240 hour(s))  SARS Coronavirus 2 (CEPHEID - Performed in [place] hospital lab), Hosp Order     Status: None   Collection Time: 05/20/18  5:57 PM  Result Value Ref Range Status   SARS Coronavirus 2 NEGATIVE NEGATIVE Final    Comment: (NOTE) If result is NEGATIVE SARS-CoV-2 target nucleic acids are NOT DETECTED. The SARS-CoV-2 RNA is generally detectable in upper and lower    respiratory specimens during the acute phase of infection. The lowest  concentration of SARS-CoV-2 viral copies this assay can detect is 250  copies / mL. A negative result does not preclude SARS-CoV-2 infection  and should not be used as the sole basis for treatment or other  patient management decisions.  A negative result may occur with  improper specimen collection / handling, submission of specimen other  than nasopharyngeal swab, presence of viral mutation(s) within the  areas targeted by this assay, and inadequate number of viral copies  (<250 copies / mL). A negative result must be combined with clinical  observations, patient history, and epidemiological information. If result is POSITIVE SARS-CoV-2 target nucleic acids are DETECTED. The SARS-CoV-2 RNA is generally detectable in upper and lower  respiratory specimens dur ing the acute phase of infection.  Positive  results are indicative of active infection with SARS-CoV-2.  Clinical  correlation with patient history and other diagnostic information is  necessary to determine patient infection status.  Positive results do  not rule out bacterial infection or co-infection with other viruses. If result is PRESUMPTIVE POSTIVE SARS-CoV-2 nucleic acids MAY BE PRESENT.   A presumptive positive result was obtained on the submitted specimen  and confirmed on repeat testing.  While 2019 novel coronavirus  (SARS-CoV-2) nucleic acids may be present in the submitted sample  additional confirmatory testing may be necessary for epidemiological  and / or clinical management purposes  to differentiate between  SARS-CoV-2 and other Sarbecovirus currently known to infect humans.  If clinically indicated additional testing with an alternate test  methodology [phone]) is advised. The SARS-CoV-2 RNA is generally  detectable in upper and lower respiratory sp ecimens during the acute  phase of infection. The expected result is Negative. Fact  Sheet for Patients:  StrictlyIdeas.no Fact Sheet for Healthcare Providers: BankingDealers.co.za This test is not yet approved or cleared by the [place] FDA and has been authorized for detection and/or diagnosis of SARS-CoV-2 by FDA under an Emergency Use Authorization (EUA).  This EUA will remain in effect (meaning this test can be used) for the duration of the COVID-19 declaration under Section 564(b)(1) of the Act, 21 U.S.C. section 360bbb-3(b)(1), unless the authorization is terminated or revoked sooner. Performed at [facility], [place] [address], [place], [place] [address]       Studies: Dg Chest 2 View  Result Date: 05/20/2018 CLINICAL DATA:  Epigastric pain.  History of hiatal hernia. EXAM: CHEST - 2 VIEW COMPARISON:  CT chest 01/27/2014.  PA and lateral chest 01/15/2014. FINDINGS: The patient is status post CABG. There is cardiomegaly and atherosclerosis. Lungs are clear. Moderate hiatal hernia is identified and appears increased in size since the prior study. No acute bony abnormality. IMPRESSION: Moderate hiatal hernia appears somewhat increased compared to the prior exams. Cardiomegaly without edema. Atherosclerosis. Electronically Signed   By: Inge Rise M.D.   On: 05/20/2018 15:36   Ct Abdomen Pelvis W Contrast  Result Date: 05/20/2018 CLINICAL DATA:  Initial evaluation for acute upper abdominal pain. EXAM: CT ABDOMEN AND PELVIS WITH CONTRAST TECHNIQUE: Multidetector CT imaging of the abdomen and pelvis was performed using the standard protocol following bolus administration of intravenous contrast. CONTRAST:  126mL OMNIPAQUE IOHEXOL 300 MG/ML  SOLN COMPARISON:  None available. FINDINGS: Lower chest: Scattered atelectatic changes seen dependently within the visualized lung bases, left greater than right. Visualized lungs are otherwise clear. Cardiomegaly with 3 vessel coronary artery calcifications partially visualized.  Hepatobiliary: Liver demonstrates a normal contrast enhanced appearance. Focal fat deposition noted adjacent to the falciform ligament. Subcentimeter radiopaque density noted within the gallbladder lumen, compatible with cholelithiasis. Gallbladder itself is somewhat distended with hazy pericholecystic fat stranding and mucosal enhancement, raising the possibility for possible acute cholecystitis. No significant common bile duct dilatation. Mild intrahepatic biliary dilatation. Pancreas: Pancreas within normal limits. No abnormal pancreatic ductal dilatation. Spleen: Spleen within normal limits. Adrenals/Urinary Tract: Adrenal glands are normal. Left kidney mildly atrophic as compared to the right. Symmetric enhancement seen within the kidneys bilaterally. No nephrolithiasis, hydronephrosis or focal enhancing renal mass. No hydroureter. Bladder within normal limits. Stomach/Bowel: Large hiatal hernia. Stomach otherwise unremarkable. No evidence for bowel obstruction. Normal appendix. Colonic diverticulosis without evidence for acute diverticulitis. No acute inflammatory changes seen about the bowels. Moderate to large volume retained stool within the distal colon, suggesting constipation. Vascular/Lymphatic: Advanced atherosclerotic change throughout the intra-abdominal aorta and its branch vessels. No aneurysm. Mesenteric vessels grossly patent proximally. No adenopathy. Reproductive: Prostate mildly enlarged measuring 5  cm in transverse diameter. Other: No free air or fluid. Musculoskeletal: No acute osseous abnormality. No discrete lytic or blastic osseous lesions. Moderate thoracolumbar levoscoliosis, apex at L2. IMPRESSION: 1. Cholelithiasis with hazy pericholecystic fat stranding and mucosal enhancement raising the possibility for acute cholecystitis. Correlation with laboratory values recommended. Additionally, further evaluation with dedicated right upper quadrant ultrasound could be performed as clinically  warranted. 2. Large hiatal hernia. 3. Moderate to large volume retained stool within the distal colon, suggesting constipation. 4. Colonic diverticulosis without evidence for acute diverticulitis. 5. Advanced atherosclerosis with 3 vessel coronary artery calcifications. Electronically Signed   By: Jeannine Boga M.D.   On: 05/20/2018 17:22   US Abdomen Limited Ruq  Result Date: 05/20/2018 CLINICAL DATA:  Upper abdominal/epigastric pain EXAM: ULTRASOUND ABDOMEN LIMITED RIGHT UPPER QUADRANT COMPARISON:  CT abdomen and pelvis May 20, 2018 FINDINGS: Gallbladder: Within the gallbladder, there are echogenic foci which move and shadow consistent with cholelithiasis. Largest gallstone measures 1.1 cm in length. The gallbladder wall is thickened and subtly edematous. There is no appreciable pericholecystic fluid. No sonographic Murphy sign noted by sonographer. Common bile duct: Diameter: 6 mm. No intrahepatic or extrahepatic biliary duct dilatation. Liver: No focal lesion identified. Within normal limits in parenchymal echogenicity. Portal vein is patent on color Doppler imaging with normal direction of blood flow towards the liver. IMPRESSION: Cholelithiasis. Gallbladder wall mildly thickened and subtly edematous. Suspect a degree of acute cholecystitis given this appearance. Study otherwise unremarkable. Electronically Signed   By: Lowella Grip III M.D.   On: 05/20/2018 19:15    Scheduled Meds:  docusate sodium  100 mg Oral BID    Continuous Infusions:  dextrose 5 % and 0.9% NaCl 75 mL/hr at 05/20/18 2151   piperacillin-tazobactam (ZOSYN)  IV 3.375 g (05/21/18 0447)     LOS: 1 day     Kayleen Memos, MD [facility] Pager [phone]  If 7PM-7AM, please contact night-coverage [email or web address] Password [facility] 05/21/2018, 12:19 PM

## 2018-05-21 NOTE — Progress Notes (Signed)
Progress Note  Patient Name: Brandon Rojas Date of Encounter: 05/21/2018  Primary Cardiologist: Minus Breeding, MD   Subjective   Sleeping comfortably but awakens easily to voice. No chest pain or shortness of breath. Feels that his abdominal pain is improving.  Inpatient Medications    Scheduled Meds: . docusate sodium  100 mg Oral BID   Continuous Infusions: . dextrose 5 % and 0.9% NaCl 75 mL/hr at 05/21/18 1254  . piperacillin-tazobactam (ZOSYN)  IV 3.375 g (05/21/18 0447)   PRN Meds: acetaminophen **OR** acetaminophen, morphine injection, oxyCODONE   Vital Signs    Vitals:   05/20/18 1815 05/20/18 1830 05/20/18 2142 05/21/18 0618  BP: (!) 149/81 (!) 159/82 (!) 186/79 (!) 147/70  Pulse: 72 71 74 71  Resp: 14 15 19 17   Temp:   99.2 F (37.3 C) 99.2 F (37.3 C)  TempSrc:   Oral Oral  SpO2: 94% 98% 99% 94%  Weight:    65.3 kg  Height:        Intake/Output Summary (Last 24 hours) at 05/21/2018 1256 Last data filed at 05/21/2018 1254 Gross per 24 hour  Intake 1090.63 ml  Output 350 ml  Net 740.63 ml   Last 3 Weights 05/21/2018 05/20/2018 05/06/2018  Weight (lbs) 143 lb 14.4 oz 147 lb 141 lb  Weight (kg) 65.273 kg 66.679 kg 63.957 kg      Telemetry    NSR - Personally Reviewed  ECG    NSR, Nonspecific repol abnormality - Personally Reviewed  Physical Exam   GEN: No acute distress.   Neck: No JVD Cardiac: RRR, no murmurs, rubs, or gallops.  Respiratory: Clear to auscultation bilaterally. GI: Soft, mildly TTP in RUQ, +bowel sounds  MS: No edema; No deformity. Neuro:  Nonfocal  Psych: Normal affect   Labs    Chemistry Recent Labs  Lab 05/15/18 1057 05/20/18 1453 05/21/18 0407  NA 139 138 138  K 4.6 4.0 3.8  CL 104 103 103  CO2 28 23 25   GLUCOSE 89 121* 124*  BUN 17 11 13   CREATININE 1.07 1.05 1.08  CALCIUM 9.5 9.9 9.4  PROT 6.6 6.5 5.9*  ALBUMIN 4.1 3.6 3.1*  AST 22 23 20   ALT 4 8 26   ALKPHOS 65 60 58  BILITOT 0.6 0.9 0.8  GFRNONAA   --  >60 >60  GFRAA  --  >60 >60  ANIONGAP  --  12 10     Hematology Recent Labs  Lab 05/15/18 1057 05/20/18 1453 05/21/18 0407  WBC 8.8 16.1* 16.8*  RBC 4.94 4.72 4.67  HGB 15.3 14.5 14.3  HCT 45.0 42.7 42.2  MCV 91.1 90.5 90.4  MCH  --  30.7 30.6  MCHC 34.1 34.0 33.9  RDW 14.7 13.7 13.9  PLT 230.0 213 209    Cardiac Enzymes Recent Labs  Lab 05/20/18 1950 05/20/18 2212 05/21/18 0407 05/21/18 0943  TROPONINI 0.05* 0.06* 0.06* 0.05*   No results for input(s): TROPIPOC in the last 168 hours.   BNPNo results for input(s): BNP, PROBNP in the last 168 hours.   DDimer No results for input(s): DDIMER in the last 168 hours.   Radiology    Dg Chest 2 View  Result Date: 05/20/2018 CLINICAL DATA:  Epigastric pain.  History of hiatal hernia. EXAM: CHEST - 2 VIEW COMPARISON:  CT chest 01/27/2014.  PA and lateral chest 01/15/2014. FINDINGS: The patient is status post CABG. There is cardiomegaly and atherosclerosis. Lungs are clear. Moderate hiatal hernia is identified  and appears increased in size since the prior study. No acute bony abnormality. IMPRESSION: Moderate hiatal hernia appears somewhat increased compared to the prior exams. Cardiomegaly without edema. Atherosclerosis. Electronically Signed   By: Inge Rise M.D.   On: 05/20/2018 15:36   Ct Abdomen Pelvis W Contrast  Result Date: 05/20/2018 CLINICAL DATA:  Initial evaluation for acute upper abdominal pain. EXAM: CT ABDOMEN AND PELVIS WITH CONTRAST TECHNIQUE: Multidetector CT imaging of the abdomen and pelvis was performed using the standard protocol following bolus administration of intravenous contrast. CONTRAST:  142mL OMNIPAQUE IOHEXOL 300 MG/ML  SOLN COMPARISON:  None available. FINDINGS: Lower chest: Scattered atelectatic changes seen dependently within the visualized lung bases, left greater than right. Visualized lungs are otherwise clear. Cardiomegaly with 3 vessel coronary artery calcifications partially  visualized. Hepatobiliary: Liver demonstrates a normal contrast enhanced appearance. Focal fat deposition noted adjacent to the falciform ligament. Subcentimeter radiopaque density noted within the gallbladder lumen, compatible with cholelithiasis. Gallbladder itself is somewhat distended with hazy pericholecystic fat stranding and mucosal enhancement, raising the possibility for possible acute cholecystitis. No significant common bile duct dilatation. Mild intrahepatic biliary dilatation. Pancreas: Pancreas within normal limits. No abnormal pancreatic ductal dilatation. Spleen: Spleen within normal limits. Adrenals/Urinary Tract: Adrenal glands are normal. Left kidney mildly atrophic as compared to the right. Symmetric enhancement seen within the kidneys bilaterally. No nephrolithiasis, hydronephrosis or focal enhancing renal mass. No hydroureter. Bladder within normal limits. Stomach/Bowel: Large hiatal hernia. Stomach otherwise unremarkable. No evidence for bowel obstruction. Normal appendix. Colonic diverticulosis without evidence for acute diverticulitis. No acute inflammatory changes seen about the bowels. Moderate to large volume retained stool within the distal colon, suggesting constipation. Vascular/Lymphatic: Advanced atherosclerotic change throughout the intra-abdominal aorta and its branch vessels. No aneurysm. Mesenteric vessels grossly patent proximally. No adenopathy. Reproductive: Prostate mildly enlarged measuring 5 cm in transverse diameter. Other: No free air or fluid. Musculoskeletal: No acute osseous abnormality. No discrete lytic or blastic osseous lesions. Moderate thoracolumbar levoscoliosis, apex at L2. IMPRESSION: 1. Cholelithiasis with hazy pericholecystic fat stranding and mucosal enhancement raising the possibility for acute cholecystitis. Correlation with laboratory values recommended. Additionally, further evaluation with dedicated right upper quadrant ultrasound could be performed as  clinically warranted. 2. Large hiatal hernia. 3. Moderate to large volume retained stool within the distal colon, suggesting constipation. 4. Colonic diverticulosis without evidence for acute diverticulitis. 5. Advanced atherosclerosis with 3 vessel coronary artery calcifications. Electronically Signed   By: Jeannine Boga M.D.   On: 05/20/2018 17:22   US Abdomen Limited Ruq  Result Date: 05/20/2018 CLINICAL DATA:  Upper abdominal/epigastric pain EXAM: ULTRASOUND ABDOMEN LIMITED RIGHT UPPER QUADRANT COMPARISON:  CT abdomen and pelvis May 20, 2018 FINDINGS: Gallbladder: Within the gallbladder, there are echogenic foci which move and shadow consistent with cholelithiasis. Largest gallstone measures 1.1 cm in length. The gallbladder wall is thickened and subtly edematous. There is no appreciable pericholecystic fluid. No sonographic Murphy sign noted by sonographer. Common bile duct: Diameter: 6 mm. No intrahepatic or extrahepatic biliary duct dilatation. Liver: No focal lesion identified. Within normal limits in parenchymal echogenicity. Portal vein is patent on color Doppler imaging with normal direction of blood flow towards the liver. IMPRESSION: Cholelithiasis. Gallbladder wall mildly thickened and subtly edematous. Suspect a degree of acute cholecystitis given this appearance. Study otherwise unremarkable. Electronically Signed   By: Lowella Grip III M.D.   On: 05/20/2018 19:15    Cardiac Studies   ECHO:  07/09/2017 - Left ventricle: The cavity size was normal. Wall  thickness was increased in a pattern of mild LVH. Systolic function was normal. The estimated ejection fraction was in the range of 55% to 60%. Wall motion was normal; there were no regional wall motion abnormalities. Features are consistent with a pseudonormal left ventricular filling pattern, with concomitant abnormal relaxation and increased filling pressure (grade 2 diastolic dysfunction). - Mitral valve:  Mildly calcified annulus. There was moderate regurgitation. - Left atrium: The atrium was severely dilated. - Right ventricle: The cavity size was mildly dilated. - Right atrium: The atrium was severely dilated. - Tricuspid valve: There was moderate regurgitation. - Pulmonary arteries: Systolic pressure was moderately increased. PA peak pressure: 44 mm Hg (S).  CATH: 03/08/2010 ANGIOGRAPHIC DATA: 1. The left main is free of critical disease. 2. The LAD is diffusely irregular proximally and diffusely plaqued. Beyond this, there is a stent previously placed leading into a smaller caliber diagonal with about 60% proximal narrowing. The stent has about 40% in-stent re-narrowing. After the takeoff of the small diagonal, there is an 80% stenosis and had evidence of competitive filling distally. 3. The internal mammary to the distal LAD is widely patent, fills the LAD retrograde. 4. The circumflex is a dominant vessel. There is 80% narrowing prior to the first tiny marginal branch. The first marginal branch has 70% narrowing proximally and is only about 1.4-mm caliber vessel. The circumflex in this territory is somewhat diffusely diseased and then opens back up and has about 70% narrowing distally. 5. The saphenous vein graft to the distal posterolateral branch is widely patent without significant narrowing and retrograde fills the posterolateral branch into the PDA. 6. The right coronary artery is small dominant vessel without critical disease. 7. Ventriculography in the RAO projection reveals well-preserved global systolic function. CONCLUSION: 1. Well-preserved left ventricular function. 2. Continued patency of internal mammary to the LAD. 3. Continued patency of the saphenous vein graft to the posterolateral branch. 4. Small-caliber disease involving the first diagonal and first marginal that do not appear to  be optimal for percutaneous intervention.  Patient Profile     SEENA FACE is a 73 y.o. 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  Troponin was also elevated and Cardiology was asked to evaluate him.  Assessment & Plan    1. Cholecystitis with cholelithiasis: symptomatic w/ RUQ pain. LFTs, T bili and Lipase wnl. IV antibiotics and pain management per general surgery and IM. Plan likely lap chole this admission.   2. Elevated Troponin: trend is flat and low level at 0.05>>0.06>>0.06, not c/w ACS which has a rise and fall pattern. His trend is c/w demand ischemia. Also denies any recent anginal CP. No plans for ischemic w/u.   3. CAD: s/p prior CABG. No anginal symptoms. Recent abdominal/ epigastric pain felt related to acute cholecystitis. Plavix currently on hold for anticipated surgery. Resume once safe to do so from a surgical standpoint. Home meds on hold. Not getting stain or ASA. Resume once able to better tolerate PO.   4. HTN: 147/70 this am. SBP as high as the 180s this admission. Suspect pain may be contributing. Monitor closely. Can use PRN IV hydralazine when needed.    5. Preop Assessment: no recent anginal symptoms. EKG w/o significant ST abnormalties. Troponin trend not c/w ACS. Echo last year showed normal EF at 55-60%, G2DD and moderate  MR. No s/s of acute CHF. Ok to proceed with noncardiac surgery w/o need for further cardiac testing. Ok to hold Plavix for surgery. Resume once safe to do so. Monitor BP closely. Monitor on tele. Recommend judicious use of IVFs during the perioperative period given moderate diastolic dysfunction. Strict I/Os would be beneficial. We will follow along with you.   For questions or updates, please contact [facility] Please consult [email or web address] for contact info under     Signed,  Buford Dresser, MD  05/21/2018, 12:56 PM

## 2018-05-21 NOTE — Progress Notes (Signed)
Patients wife, Kermit Balo, says she hasn't had any updates from the physicians. Please give her a call at [phone]. Thanks.

## 2018-05-22 ENCOUNTER — Encounter (HOSPITAL_COMMUNITY): Payer: Self-pay | Admitting: Interventional Radiology

## 2018-05-22 ENCOUNTER — Inpatient Hospital Stay (HOSPITAL_COMMUNITY): Payer: Medicare PPO

## 2018-05-22 ENCOUNTER — Telehealth: Payer: Self-pay | Admitting: Neurology

## 2018-05-22 HISTORY — PX: IR PERC CHOLECYSTOSTOMY: IMG2326

## 2018-05-22 LAB — CBC WITH DIFFERENTIAL/PLATELET
Abs Immature Granulocytes: 0.1 10*3/uL — ABNORMAL HIGH (ref 0.00–0.07)
Basophils Absolute: 0.1 10*3/uL (ref 0.0–0.1)
Basophils Relative: 0 %
Eosinophils Absolute: 0.1 10*3/uL (ref 0.0–0.5)
Eosinophils Relative: 1 %
HCT: 37.1 % — ABNORMAL LOW (ref 39.0–52.0)
Hemoglobin: 12.9 g/dL — ABNORMAL LOW (ref 13.0–17.0)
Immature Granulocytes: 1 %
Lymphocytes Relative: 6 %
Lymphs Abs: 0.9 10*3/uL (ref 0.7–4.0)
MCH: 31.2 pg (ref 26.0–34.0)
MCHC: 34.8 g/dL (ref 30.0–36.0)
MCV: 89.8 fL (ref 80.0–100.0)
Monocytes Absolute: 1 10*3/uL (ref 0.1–1.0)
Monocytes Relative: 6 %
Neutro Abs: 13.6 10*3/uL — ABNORMAL HIGH (ref 1.7–7.7)
Neutrophils Relative %: 86 %
Platelets: 167 10*3/uL (ref 150–400)
RBC: 4.13 MIL/uL — ABNORMAL LOW (ref 4.22–5.81)
RDW: 13.9 % (ref 11.5–15.5)
WBC: 15.7 10*3/uL — ABNORMAL HIGH (ref 4.0–10.5)
nRBC: 0 % (ref 0.0–0.2)

## 2018-05-22 LAB — GLUCOSE, CAPILLARY
Glucose-Capillary: 102 mg/dL — ABNORMAL HIGH (ref 70–99)
Glucose-Capillary: 102 mg/dL — ABNORMAL HIGH (ref 70–99)
Glucose-Capillary: 101 mg/dL — ABNORMAL HIGH (ref 70–99)
Glucose-Capillary: 116 mg/dL — ABNORMAL HIGH (ref 70–99)

## 2018-05-22 LAB — BASIC METABOLIC PANEL
Anion gap: 9 (ref 5–15)
BUN: 13 mg/dL (ref 8–23)
CO2: 25 mmol/L (ref 22–32)
Calcium: 8.8 mg/dL — ABNORMAL LOW (ref 8.9–10.3)
Chloride: 104 mmol/L (ref 98–111)
Creatinine, Ser: 1.05 mg/dL (ref 0.61–1.24)
GFR calc Af Amer: >60 mL/min (ref >60)
GFR calc non Af Amer: >60 mL/min (ref >60)
Glucose, Bld: 104 mg/dL — ABNORMAL HIGH (ref 70–99)
Potassium: 3.8 mmol/L (ref 3.5–5.1)
Sodium: 138 mmol/L (ref 135–145)

## 2018-05-22 LAB — LIPASE, BLOOD: Lipase: 22 U/L (ref 11–51)

## 2018-05-22 LAB — HEPATIC FUNCTION PANEL
ALT: 21 U/L (ref 0–44)
AST: 21 U/L (ref 15–41)
Albumin: 2.5 g/dL — ABNORMAL LOW (ref 3.5–5.0)
Alkaline Phosphatase: 64 U/L (ref 38–126)
Bilirubin, Direct: 0.3 mg/dL — ABNORMAL HIGH (ref 0.0–0.2)
Indirect Bilirubin: 0.7 mg/dL (ref 0.3–0.9)
Total Bilirubin: 1 mg/dL (ref 0.3–1.2)
Total Protein: 5.4 g/dL — ABNORMAL LOW (ref 6.5–8.1)

## 2018-05-22 LAB — PROTIME-INR
INR: 1.2 (ref 0.8–1.2)
Prothrombin Time: 14.7 seconds (ref 11.4–15.2)

## 2018-05-22 MED ORDER — SODIUM CHLORIDE 0.9% FLUSH
5.0000 mL | Freq: Three times a day (TID) | INTRAVENOUS | Status: DC
  Administered 2018-05-22 – 2018-05-23 (×4): 5 mL

## 2018-05-22 MED ORDER — LIDOCAINE HCL 1 % IJ SOLN
INTRAMUSCULAR | Status: AC
  Filled 2018-05-22: qty 20

## 2018-05-22 MED ORDER — HYDRALAZINE HCL 20 MG/ML IJ SOLN
10.0000 mg | Freq: Four times a day (QID) | INTRAMUSCULAR | Status: DC | PRN
  Filled 2018-05-22: qty 1

## 2018-05-22 MED ORDER — CARBIDOPA-LEVODOPA 25-100 MG PO TABS
1.0000 | ORAL_TABLET | Freq: Three times a day (TID) | ORAL | Status: DC
  Administered 2018-05-22 – 2018-05-24 (×5): 1 via ORAL
  Filled 2018-05-22 (×5): qty 1

## 2018-05-22 MED ORDER — IOHEXOL 300 MG/ML  SOLN: 15.0000 mL | Freq: Once | INTRAMUSCULAR | Status: DC | PRN

## 2018-05-22 MED ORDER — DEXTROSE-NACL 5-0.45 % IV SOLN
INTRAVENOUS | Status: DC
  Administered 2018-05-22 – 2018-05-24 (×3): via INTRAVENOUS

## 2018-05-22 MED ORDER — NAPHAZOLINE-GLYCERIN 0.012-0.2 % OP SOLN
1.0000 [drp] | Freq: Four times a day (QID) | OPHTHALMIC | Status: DC | PRN
  Filled 2018-05-22: qty 15

## 2018-05-22 MED ORDER — MIDAZOLAM HCL 2 MG/2ML IJ SOLN
INTRAMUSCULAR | Status: AC
  Filled 2018-05-22: qty 2

## 2018-05-22 MED ORDER — LIDOCAINE HCL 1 % IJ SOLN
INTRAMUSCULAR | Status: AC | PRN
  Administered 2018-05-22: 10 mL

## 2018-05-22 MED ORDER — FENTANYL CITRATE (PF) 100 MCG/2ML IJ SOLN
INTRAMUSCULAR | Status: AC | PRN
  Administered 2018-05-22: 50 ug via INTRAVENOUS
  Administered 2018-05-22: 25 ug via INTRAVENOUS

## 2018-05-22 MED ORDER — PRAVASTATIN SODIUM 10 MG PO TABS
20.0000 mg | ORAL_TABLET | Freq: Every day | ORAL | Status: DC
  Administered 2018-05-23: 20 mg via ORAL
  Filled 2018-05-22: qty 2

## 2018-05-22 MED ORDER — FENTANYL CITRATE (PF) 100 MCG/2ML IJ SOLN
INTRAMUSCULAR | Status: AC
  Filled 2018-05-22: qty 2

## 2018-05-22 MED ORDER — B COMPLEX-C PO TABS
1.0000 | ORAL_TABLET | ORAL | Status: DC
  Administered 2018-05-22 – 2018-05-24 (×2): 1 via ORAL
  Filled 2018-05-22 (×3): qty 1

## 2018-05-22 MED ORDER — MIDAZOLAM HCL 2 MG/2ML IJ SOLN
INTRAMUSCULAR | Status: AC | PRN
  Administered 2018-05-22: 1 mg via INTRAVENOUS

## 2018-05-22 NOTE — Progress Notes (Signed)
PROGRESS NOTE  Brandon Rojas WSF:[id number] DOB: 10/02/1945 DOA: 05/20/2018 PCP: Biagio Borg, MD  HPI/Recap of past 24 hours: Brandon Rojas is a 73 y.o. male with story of CAD status post CABG, hypertension, Parkinson's disease presents to the ER with complaints of epigastric pain ongoing for last 2 days.  Patient initially thought it was due to his hiatal hernia.  Denies any associated vomiting or diarrhea denies any fever chills.  Denies any chest pain.  ED Course: In the ER on exam patient has right upper quadrant tenderness.  CT abdomen followed by ultrasound shows features concerning for acute cholecystitis.  WBC count is elevated LFTs are normal.  On-call general surgeon was consulted.  Started on empiric antibiotics.  Given history of CAD troponin was done which shows mild elevation.  Cardiology was consulted.  At the time of my exam patient is tender in the abdomen denies any chest pain.  05/22/18: Seen and examined this morning.  Somnolent but answering questions appropriately.  Post drainage catheter placement into the gallbladder for acute cholecystitis on 05/22/2018 by interventional radiology.   Assessment/Plan: Principal Problem:   Acute cholecystitis Active Problems:   Parkinson's disease (HCC)   Elevated troponin   Epigastric pain   CAD (coronary artery disease) of artery bypass graft  Acute cholecystitis status post drainage catheter placement on 05/22/2018 By interventional radiology Continue Zosyn Pain management in place with IV morphine 2 mg every 2 hours as needed for severe pain  Acute metabolic encephalopathy Possibly iatrogenic secondary to opiates Chest x-ray done this morning independently reviewed revealed cardiomegaly with mild increase in pulmonary vascularity Closely monitor Reorient as indicated Fall precautions  Uncontrolled hypertension Possibly contributed by pain IV hydralazine PRN for systolic blood pressure greater than 017 or diastolic  greater than 494 If blood pressure remains elevated once his pain has subsided may consider antihypertensive medication Not on antihypertensive medications at home  Elevated troponin, ACS ruled out Peaked at 0.06 and trending down to 0.05 Denies chest pain  Hyperlipidemia Resume statin when can tolerate p.o.  Parkinson's disease Resume home medications when can tolerate p.o.  DVT prophylaxis: SCDs in anticipation of surgery. Code Status: Full code. Family Communication: Discussed with patient. Disposition Plan: Home when general surgery and interventional radiology sign off Consults called: Cardiology, general surgery, interventional radiology Admission status: Inpatient.    Objective: Vitals:   05/22/18 1635 05/22/18 1640 05/22/18 1700 05/22/18 1703  BP: (!) 171/80 (!) 158/85 (!) 161/139 (!) 162/116  Pulse: 72 67 76 79  Resp: 11 10    Temp:      TempSrc:      SpO2: 90% 95% 90% 97%  Weight:      Height:        Intake/Output Summary (Last 24 hours) at 05/22/2018 1729 Last data filed at 05/22/2018 1416 Gross per 24 hour  Intake 1231.35 ml  Output 300 ml  Net 931.35 ml   Filed Weights   05/20/18 1335 05/21/18 0618 05/22/18 0420  Weight: 66.7 kg 65.3 kg 66.5 kg    Exam:   General: 73 y.o. year-old male well-developed well-nourished in no acute distress.  Somnolent but oriented x3  Cardiovascular: Regular rate and rhythm with no rubs or gallops no JVD or thyromegaly noted.  Respiratory: Mild rales at bases with no wheezes.  Poor inspiratory effort.  Abdomen: Mild right upper quadrant abdominal tenderness on palpation this morning.  Soft nondistended with normal bowel sounds x4 quadrants.  Psychiatry: Unable to assess mood  due to somnolence.   Data Reviewed: CBC: Recent Labs  Lab 05/20/18 1453 05/21/18 0407 05/22/18 0637  WBC 16.1* 16.8* 15.7*  NEUTROABS 14.3*  --  13.6*  HGB 14.5 14.3 12.9*  HCT 42.7 42.2 37.1*  MCV 90.5 90.4 89.8  PLT 213 209 166    Basic Metabolic Panel: Recent Labs  Lab 05/20/18 1453 05/21/18 0407 05/22/18 0637  NA 138 138 138  K 4.0 3.8 3.8  CL 103 103 104  CO2 23 25 25   GLUCOSE 121* 124* 104*  BUN 11 13 13   CREATININE 1.05 1.08 1.05  CALCIUM 9.9 9.4 8.8*   GFR: Estimated Creatinine Clearance: 51.7 mL/min (by C-G formula based on SCr of 1.05 mg/dL). Liver Function Tests: Recent Labs  Lab 05/20/18 1453 05/21/18 0407 05/22/18 0637  AST 23 20 21   ALT 8 26 21   ALKPHOS 60 58 64  BILITOT 0.9 0.8 1.0  PROT 6.5 5.9* 5.4*  ALBUMIN 3.6 3.1* 2.5*   Recent Labs  Lab 05/20/18 1453 05/22/18 0637  LIPASE 26 22   No results for input(s): AMMONIA in the last 168 hours. Coagulation Profile: Recent Labs  Lab 05/22/18 1314  INR 1.2   Cardiac Enzymes: Recent Labs  Lab 05/20/18 1453 05/20/18 1950 05/20/18 2212 05/21/18 0407 05/21/18 0943  TROPONINI 0.06* 0.05* 0.06* 0.06* 0.05*   BNP (last 3 results) No results for input(s): PROBNP in the last 8760 hours. HbA1C: No results for input(s): HGBA1C in the last 72 hours. CBG: Recent Labs  Lab 05/21/18 1912 05/22/18 0105 05/22/18 0618 05/22/18 1208 05/22/18 1702  GLUCAP 118* 102* 102* 101* 116*   Lipid Profile: No results for input(s): CHOL, HDL, LDLCALC, TRIG, CHOLHDL, LDLDIRECT in the last 72 hours. Thyroid Function Tests: No results for input(s): TSH, T4TOTAL, FREET4, T3FREE, THYROIDAB in the last 72 hours. Anemia Panel: No results for input(s): VITAMINB12, FOLATE, FERRITIN, TIBC, IRON, RETICCTPCT in the last 72 hours. Urine analysis:    Component Value Date/Time   COLORURINE YELLOW 05/20/2018 [place] 05/20/2018 1443   LABSPEC 1.010 05/20/2018 1443   PHURINE 5.0 05/20/2018 1443   GLUCOSEU NEGATIVE 05/20/2018 1443   GLUCOSEU NEGATIVE 05/15/2018 1057   HGBUR NEGATIVE 05/20/2018 1443   BILIRUBINUR NEGATIVE 05/20/2018 1443   KETONESUR NEGATIVE 05/20/2018 1443   PROTEINUR NEGATIVE 05/20/2018 1443   UROBILINOGEN 0.2  05/15/2018 1057   NITRITE NEGATIVE 05/20/2018 1443   LEUKOCYTESUR NEGATIVE 05/20/2018 1443   Sepsis Labs: @LABRCNTIP (procalcitonin:4,lacticidven:4)  ) Recent Results (from the past 240 hour(s))  SARS Coronavirus 2 (CEPHEID - Performed in [place] hospital lab), Hosp Order     Status: None   Collection Time: 05/20/18  5:57 PM  Result Value Ref Range Status   SARS Coronavirus 2 NEGATIVE NEGATIVE Final    Comment: (NOTE) If result is NEGATIVE SARS-CoV-2 target nucleic acids are NOT DETECTED. The SARS-CoV-2 RNA is generally detectable in upper and lower  respiratory specimens during the acute phase of infection. The lowest  concentration of SARS-CoV-2 viral copies this assay can detect is 250  copies / mL. A negative result does not preclude SARS-CoV-2 infection  and should not be used as the sole basis for treatment or other  patient management decisions.  A negative result may occur with  improper specimen collection / handling, submission of specimen other  than nasopharyngeal swab, presence of viral mutation(s) within the  areas targeted by this assay, and inadequate number of viral copies  (<250 copies / mL). A negative result must  be combined with clinical  observations, patient history, and epidemiological information. If result is POSITIVE SARS-CoV-2 target nucleic acids are DETECTED. The SARS-CoV-2 RNA is generally detectable in upper and lower  respiratory specimens dur ing the acute phase of infection.  Positive  results are indicative of active infection with SARS-CoV-2.  Clinical  correlation with patient history and other diagnostic information is  necessary to determine patient infection status.  Positive results do  not rule out bacterial infection or co-infection with other viruses. If result is PRESUMPTIVE POSTIVE SARS-CoV-2 nucleic acids MAY BE PRESENT.   A presumptive positive result was obtained on the submitted specimen  and confirmed on repeat testing.   While 2019 novel coronavirus  (SARS-CoV-2) nucleic acids may be present in the submitted sample  additional confirmatory testing may be necessary for epidemiological  and / or clinical management purposes  to differentiate between  SARS-CoV-2 and other Sarbecovirus currently known to infect humans.  If clinically indicated additional testing with an alternate test  methodology [phone]) is advised. The SARS-CoV-2 RNA is generally  detectable in upper and lower respiratory sp ecimens during the acute  phase of infection. The expected result is Negative. Fact Sheet for Patients:  StrictlyIdeas.no Fact Sheet for Healthcare Providers: BankingDealers.co.za This test is not yet approved or cleared by the [place] FDA and has been authorized for detection and/or diagnosis of SARS-CoV-2 by FDA under an Emergency Use Authorization (EUA).  This EUA will remain in effect (meaning this test can be used) for the duration of the COVID-19 declaration under Section 564(b)(1) of the Act, 21 U.S.C. section 360bbb-3(b)(1), unless the authorization is terminated or revoked sooner. Performed at [facility], [place] [address] [place], [place] [address]       Studies: Ir Perc Cholecystostomy  Result Date: 05/22/2018 INDICATION: Acute cholecystitis, poor operative candidate. Request made for image guided placement of cholecystostomy tube for infection source control purposes. EXAM: ULTRASOUND AND FLUOROSCOPIC-GUIDED CHOLECYSTOSTOMY TUBE PLACEMENT COMPARISON:  None. MEDICATIONS: The patient is currently admitted to the hospital and on intravenous antibiotics. Antibiotics were administered within an appropriate time frame prior to skin puncture. ANESTHESIA/SEDATION: Moderate (conscious) sedation was employed during this procedure. A total of Versed 1 mg and Fentanyl 75 mcg was administered intravenously. Moderate Sedation Time: 12 minutes. The patient's  level of consciousness and vital signs were monitored continuously by radiology nursing throughout the procedure under my direct supervision. CONTRAST:  15 cc Omnipaque 300 - administered into the gallbladder fossa. FLUOROSCOPY TIME:  42 seconds (15 mGy) COMPLICATIONS: None immediate. PROCEDURE: Informed written consent was obtained from the patient after a discussion of the risks, benefits and alternatives to treatment. Questions regarding the procedure were encouraged and answered. A timeout was performed prior to the initiation of the procedure. The right upper abdominal quadrant was prepped and draped in the usual sterile fashion, and a sterile drape was applied covering the operative field. Maximum barrier sterile technique with sterile gowns and gloves were used for the procedure. A timeout was performed prior to the initiation of the procedure. Local anesthesia was provided with 1% lidocaine with epinephrine. Ultrasound scanning of the right upper quadrant demonstrates a markedly dilated gallbladder. Of note, the patient reported pain with ultrasound imaging over the gallbladder. Utilizing a transhepatic approach, a 22 gauge needle was advanced into the gallbladder under direct ultrasound guidance. An ultrasound image was saved for documentation purposes. Appropriate intraluminal puncture was confirmed with the efflux of bile and advancement of an 0.018 wire into  the gallbladder lumen. The needle was exchanged for an [place]amay set. A small amount of contrast was injected to confirm appropriate intraluminal positioning. Over a Benson wire, a 71.2-French Cook cholecystomy tube was advanced into the gallbladder fossa, coiled and locked. Bile was aspirated and a small amount of contrast was injected as several post procedural spot radiographic images were obtained in various obliquities. The catheter was secured to the skin with suture, connected to a drainage bag and a dressing was placed. The patient tolerated  the procedure well without immediate post procedural complication. IMPRESSION: Successful ultrasound and fluoroscopic guided placement of a 10.2 French cholecystostomy tube. Electronically Signed   By: Sandi Mariscal M.D.   On: 05/22/2018 16:50   Dg Chest Port 1 View  Result Date: 05/22/2018 CLINICAL DATA:  73 year old male with altered mental status. EXAM: PORTABLE CHEST 1 VIEW COMPARISON:  Chest radiographs 05/20/2018 and earlier. Chest CT 01/27/2014. FINDINGS: Portable AP semi upright view at 0830 hours. Mildly lower lung volumes. Stable cardiac size and mediastinal contours. Cardiomegaly and chronic hiatal hernia. Prior CABG. No pneumothorax. Stable pulmonary vascularity without overt edema. Stable streaky mostly left lung base opacity. No definite pleural effusion. Visible bowel-gas pattern within normal limits. Stable visualized osseous structures. Calcified aortic atherosclerosis. IMPRESSION: 1. Lower lung volumes with lung base opacity, favor atelectasis. 2. No new cardiopulmonary abnormality. 3.  Aortic Atherosclerosis (ICD10-I70.0). Electronically Signed   By: Genevie Ann M.D.   On: 05/22/2018 08:59    Scheduled Meds:  B-complex with vitamin C  1 tablet Oral QODAY   docusate sodium  100 mg Oral BID   fentaNYL       lidocaine       midazolam       pravastatin  20 mg Oral q1800   sodium chloride flush  5 mL Intracatheter Q8H    Continuous Infusions:  sodium chloride 10 mL/hr at 05/22/18 1510   dextrose 5 % and 0.45% NaCl 50 mL/hr at 05/22/18 1416   piperacillin-tazobactam (ZOSYN)  IV 3.375 g (05/22/18 1445)     LOS: 2 days     Kayleen Memos, MD [facility] Pager [phone]  If 7PM-7AM, please contact night-coverage [email or web address] Password [facility] 05/22/2018, 5:29 PM

## 2018-05-22 NOTE — TOC Initial Note (Signed)
Transition of Care [facility]) - Initial/Assessment Note    Patient Details  Name: Brandon Rojas MRN: [id number] Date of Birth: 11-18-1945  [facility] CM/SW Contact:    Sherrilyn Rist Phone Number: [phone] 05/22/2018, 9:43 AM  Clinical Narrative:                 Patient lives at home with spouse; PCP: Biagio Borg, MD; has private insurance with [facility] Medicare with prescription drug coverage; CM following for progression of care.  Expected Discharge Plan: Home/Self Care Barriers to Discharge: No Barriers Identified   Patient Goals and CMS Choice     Choice offered to / list presented to : NA  Expected Discharge Plan and Services Expected Discharge Plan: Home/Self Care In-house Referral: NA Discharge Planning Services: CM Consult Post Acute Care Choice: NA Living arrangements for the past 2 months: Single Family Home Expected Discharge Date: 05/23/18               DME Arranged: N/A DME Agency: NA       HH Arranged: NA HH Agency: NA        Prior Living Arrangements/Services Living arrangements for the past 2 months: Single Family Home Lives with:: Spouse          Need for Family Participation in Patient Care: Yes (Comment) Care giver support system in place?: Yes (comment)   Criminal Activity/Legal Involvement Pertinent to Current Situation/Hospitalization: No - Comment as needed  Activities of Daily Living Home Assistive Devices/Equipment: None ADL Screening (condition at time of admission) Patient's cognitive ability adequate to safely complete daily activities?: Yes Is the patient deaf or have difficulty hearing?: No Does the patient have difficulty seeing, even when wearing glasses/contacts?: No Does the patient have difficulty concentrating, remembering, or making decisions?: No Patient able to express need for assistance with ADLs?: Yes Does the patient have difficulty dressing or bathing?: No Independently performs ADLs?:  Yes (appropriate for developmental age) Does the patient have difficulty walking or climbing stairs?: Yes Weakness of Legs: Both Weakness of Arms/Hands: None  Permission Sought/Granted                  Emotional Assessment         Alcohol / Substance Use: Not Applicable Psych Involvement: No (comment)  Admission diagnosis:  Cholecystitis [K81.9] Epigastric pain [R10.13] Prolonged QT interval [R94.31] Acute cholecystitis [K81.0] Patient Active Problem List   Diagnosis Date Noted  . Elevated troponin 05/20/2018  . Epigastric pain 05/20/2018  . CAD (coronary artery disease) of artery bypass graft 05/20/2018  . Acute cholecystitis 05/20/2018  . Subacute left lumbar radiculopathy 06/23/2015  . Mitral regurgitation 09/17/2014  . Parkinson's disease ([place]) 08/19/2014  . Pulmonary hypertension ([place]) 03/30/2014  . Piriformis syndrome of left side 03/02/2014  . Peripheral edema 01/15/2014  . Movement disorder 01/15/2014  . Hypercalcemia 01/15/2014  . Left hip pain 01/15/2014  . Hoarseness 01/15/2014  . Back pain 06/08/2010  . Encounter for long-term (current) use of high-risk medication 06/08/2010  . ED (erectile dysfunction) 06/08/2010  . Preventative health care 06/07/2010  . SHOULDER PAIN, LEFT 08/30/2009  . CAD, AUTOLOGOUS BYPASS GRAFT 03/31/2009  . CAROTID ARTERY DISEASE 01/25/2009  . BRADYCARDIA 10/08/2007  . COLONIC POLYPS 03/20/2007  . HIATAL HERNIA 03/20/2007  . DIVERTICULOSIS, COLON 03/20/2007  . Hyperlipidemia 09/19/2006  . OBESITY 09/19/2006  . Anxiety state 09/19/2006  . ERECTILE DYSFUNCTION 09/19/2006  . Depression 09/19/2006  . Essential hypertension 09/19/2006  . Coronary  atherosclerosis 09/19/2006  . ALLERGIC RHINITIS 09/19/2006  . GERD 09/19/2006  . OSTEOARTHRITIS 09/19/2006  . TRANSIENT ISCHEMIC ATTACK, HX OF 09/19/2006  . PSORIASIS, HX OF 09/19/2006   PCP:  Biagio Borg, MD Pharmacy:   [place], [place]  [address] [place] [address] Phone: [phone] Fax: [place], [place] [address] Phone: [phone] Fax: [phone]     Social Determinants of Health (SDOH) Interventions    Readmission Risk Interventions No flowsheet data found.

## 2018-05-22 NOTE — Procedures (Signed)
Pre procedural Dx: Acute Cholelithiasis, poor operative candidate. Post procedural Dx: Same  Technically successful US and Fluoro guided placed of a 10 Fr drainage catheter placement into the gallbladder for acute cholecystitis. Chole tube connected to gravity bag.   EBL: Minimal  Complications: None immediate  Jay Solana Coggin, MD Pager #: [phone]   

## 2018-05-22 NOTE — Progress Notes (Signed)
Subjective: CC: Abdominal pain Patient confused this morning. This is new from his baseline. Unsure of onset, required green safety mitts after "pulling at things" around 2AM. He is orientated x 3. He complains of pain in his abdomen, more generalized today, most severe in epigastrium, but pain overall improved from yesterday. Notes some nausea but no emesis. No fever overnight. Respirations appear labored.    Objective: Vital signs in last 24 hours: Temp:  [98.4 F (36.9 C)-99.3 F (37.4 C)] 98.8 F (37.1 C) (05/14 0420) Pulse Rate:  [66-74] 66 (05/14 0420) Resp:  [18] 18 (05/14 0420) BP: (127-144)/(63-96) 135/64 (05/14 0420) SpO2:  [95 %-100 %] 97 % (05/14 0420) Weight:  [66.5 kg] 66.5 kg (05/14 0420) Last BM Date: 05/19/18("day before yesterday")  Intake/Output from previous day: 05/13 0701 - 05/14 0700 In: 2578.8 [P.O.:720; I.V.:1708.8; IV Piggyback:150.1] Out: 100 [Urine:100] Intake/Output this shift: No intake/output data recorded.  PE: Gen: Confused, opens eyes slightly,  HEENT: Dry MM Heart: RRR Lungs: Labored, tachypnic, rhonchi at the bases L>R.  Abd: ND, generalized tenderness that is worse in the epigastric and RUQ tenderness with some gaurding. +BS Msk: No edema   Lab Results:  Recent Labs    05/21/18 0407 05/22/18 0637  WBC 16.8* 15.7*  HGB 14.3 12.9*  HCT 42.2 37.1*  PLT 209 167   BMET Recent Labs    05/21/18 0407 05/22/18 0637  NA 138 138  K 3.8 3.8  CL 103 104  CO2 25 25  GLUCOSE 124* 104*  BUN 13 13  CREATININE 1.08 1.05  CALCIUM 9.4 8.8*   PT/INR No results for input(s): LABPROT, INR in the last 72 hours. CMP     Component Value Date/Time   NA 138 05/22/2018 0637   K 3.8 05/22/2018 0637   CL 104 05/22/2018 0637   CO2 25 05/22/2018 0637   GLUCOSE 104 (H) 05/22/2018 0637   BUN 13 05/22/2018 0637   CREATININE 1.05 05/22/2018 0637   CALCIUM 8.8 (L) 05/22/2018 0637   PROT 5.9 (L) 05/21/2018 0407   ALBUMIN 3.1 (L)  05/21/2018 0407   AST 20 05/21/2018 0407   ALT 26 05/21/2018 0407   ALKPHOS 58 05/21/2018 0407   BILITOT 0.8 05/21/2018 0407   GFRNONAA >60 05/22/2018 0637   GFRAA >60 05/22/2018 0637   Lipase     Component Value Date/Time   LIPASE 26 05/20/2018 1453       Studies/Results: Dg Chest 2 View  Result Date: 05/20/2018 CLINICAL DATA:  Epigastric pain.  History of hiatal hernia. EXAM: CHEST - 2 VIEW COMPARISON:  CT chest 01/27/2014.  PA and lateral chest 01/15/2014. FINDINGS: The patient is status post CABG. There is cardiomegaly and atherosclerosis. Lungs are clear. Moderate hiatal hernia is identified and appears increased in size since the prior study. No acute bony abnormality. IMPRESSION: Moderate hiatal hernia appears somewhat increased compared to the prior exams. Cardiomegaly without edema. Atherosclerosis. Electronically Signed   By: Inge Rise M.D.   On: 05/20/2018 15:36   Ct Abdomen Pelvis W Contrast  Result Date: 05/20/2018 CLINICAL DATA:  Initial evaluation for acute upper abdominal pain. EXAM: CT ABDOMEN AND PELVIS WITH CONTRAST TECHNIQUE: Multidetector CT imaging of the abdomen and pelvis was performed using the standard protocol following bolus administration of intravenous contrast. CONTRAST:  153mL OMNIPAQUE IOHEXOL 300 MG/ML  SOLN COMPARISON:  None available. FINDINGS: Lower chest: Scattered atelectatic changes seen dependently within the visualized lung bases, left greater than right. Visualized  lungs are otherwise clear. Cardiomegaly with 3 vessel coronary artery calcifications partially visualized. Hepatobiliary: Liver demonstrates a normal contrast enhanced appearance. Focal fat deposition noted adjacent to the falciform ligament. Subcentimeter radiopaque density noted within the gallbladder lumen, compatible with cholelithiasis. Gallbladder itself is somewhat distended with hazy pericholecystic fat stranding and mucosal enhancement, raising the possibility for possible  acute cholecystitis. No significant common bile duct dilatation. Mild intrahepatic biliary dilatation. Pancreas: Pancreas within normal limits. No abnormal pancreatic ductal dilatation. Spleen: Spleen within normal limits. Adrenals/Urinary Tract: Adrenal glands are normal. Left kidney mildly atrophic as compared to the right. Symmetric enhancement seen within the kidneys bilaterally. No nephrolithiasis, hydronephrosis or focal enhancing renal mass. No hydroureter. Bladder within normal limits. Stomach/Bowel: Large hiatal hernia. Stomach otherwise unremarkable. No evidence for bowel obstruction. Normal appendix. Colonic diverticulosis without evidence for acute diverticulitis. No acute inflammatory changes seen about the bowels. Moderate to large volume retained stool within the distal colon, suggesting constipation. Vascular/Lymphatic: Advanced atherosclerotic change throughout the intra-abdominal aorta and its branch vessels. No aneurysm. Mesenteric vessels grossly patent proximally. No adenopathy. Reproductive: Prostate mildly enlarged measuring 5 cm in transverse diameter. Other: No free air or fluid. Musculoskeletal: No acute osseous abnormality. No discrete lytic or blastic osseous lesions. Moderate thoracolumbar levoscoliosis, apex at L2. IMPRESSION: 1. Cholelithiasis with hazy pericholecystic fat stranding and mucosal enhancement raising the possibility for acute cholecystitis. Correlation with laboratory values recommended. Additionally, further evaluation with dedicated right upper quadrant ultrasound could be performed as clinically warranted. 2. Large hiatal hernia. 3. Moderate to large volume retained stool within the distal colon, suggesting constipation. 4. Colonic diverticulosis without evidence for acute diverticulitis. 5. Advanced atherosclerosis with 3 vessel coronary artery calcifications. Electronically Signed   By: Jeannine Boga M.D.   On: 05/20/2018 17:22   US Abdomen Limited  Ruq  Result Date: 05/20/2018 CLINICAL DATA:  Upper abdominal/epigastric pain EXAM: ULTRASOUND ABDOMEN LIMITED RIGHT UPPER QUADRANT COMPARISON:  CT abdomen and pelvis May 20, 2018 FINDINGS: Gallbladder: Within the gallbladder, there are echogenic foci which move and shadow consistent with cholelithiasis. Largest gallstone measures 1.1 cm in length. The gallbladder wall is thickened and subtly edematous. There is no appreciable pericholecystic fluid. No sonographic Murphy sign noted by sonographer. Common bile duct: Diameter: 6 mm. No intrahepatic or extrahepatic biliary duct dilatation. Liver: No focal lesion identified. Within normal limits in parenchymal echogenicity. Portal vein is patent on color Doppler imaging with normal direction of blood flow towards the liver. IMPRESSION: Cholelithiasis. Gallbladder wall mildly thickened and subtly edematous. Suspect a degree of acute cholecystitis given this appearance. Study otherwise unremarkable. Electronically Signed   By: Lowella Grip III M.D.   On: 05/20/2018 19:15    Anti-infectives: Anti-infectives (From admission, onward)   Start     Dose/Rate Route Frequency Ordered Stop   05/21/18 0400  piperacillin-tazobactam (ZOSYN) IVPB 3.375 g     3.375 g 12.5 mL/hr over 240 Minutes Intravenous Every 8 hours 05/20/18 2017     05/20/18 2000  piperacillin-tazobactam (ZOSYN) IVPB 3.375 g     3.375 g 100 mL/hr over 30 Minutes Intravenous  Once 05/20/18 1947 05/20/18 2346       Assessment/Plan CAD s/p CABG 2006 - on Plavix and ASA. Hold Plavix  Elevated Tn HTN HLD Parkinsons  Cholecystitis with cholelithiasis - LFTs, T bili and Lipase wnl on admission - Plan to hold Plavix for 5 days  - Received Cardiac clearance per note on 5/13 - Will need medical clearance for possible Lap Chole during admission.  -  Patient confused this AM. Hospitalist planning CXR and increasing IVF. Will make NPO. CT on admission without evidence of perf ulcer or other  abnormality to explain patient's symptoms. WBC downtrending. Repeat LFTs this AM to rule out cholangitis. If elevated, would recommend perc chole drain.   - Continue IV abx  - Mobilize as tolerated. IS/Pulm Toilet.   FEN - NPO, IVF, Colace, K 3.8 VTE - SCDs, hold Plavix. Okay for Lovenox or Heparin from surgical standpoint. ID - Zosyn 5/12 >> WBC 16.1->16.8-> 15.7 POA - Wife, Delroy Ordway [phone])   LOS: 2 days    Jillyn Ledger , [facility] 05/22/2018, 8:16 AM Pager: [phone]

## 2018-05-22 NOTE — Progress Notes (Signed)
Patient BP has come down to 157/87 before I could give Hydralazine.  Will hold medication for now and continue to monitor.   Patient is now Sleeping comfortably in bed.   Place bed in upright position because patient was spitting up phlegm.  Suction set up by bedside

## 2018-05-22 NOTE — Progress Notes (Signed)
Patient pulling at things, green safety mitts placed.

## 2018-05-22 NOTE — Plan of Care (Signed)

## 2018-05-22 NOTE — Telephone Encounter (Signed)
Spouse lmom regarding Jameir and needing to speak with Dr. Carles Collet and give some information. Please Call. Thanks

## 2018-05-22 NOTE — Telephone Encounter (Signed)
Noted  

## 2018-05-22 NOTE — Progress Notes (Signed)
Patient's wife is furious about patient not being on his Carbidopa-levodopa.  Wife states "if patient comes home messed up she is going to do something about it."  Explain that MD is going to restart medication, but if patient is not able to swallow medications we cannot give them.     Patient wife still very angry.

## 2018-05-22 NOTE — Telephone Encounter (Signed)
I answered an email on him today.  I know that he is in the hospital and she is scared but I can't provide further information.  He isn't even in for a neurologic issue (gallbladder and sepsis per her email).

## 2018-05-22 NOTE — Consult Note (Signed)
Chief Complaint: Patient was seen in consultation today for percutaneous cholecystostomy drain placement Chief Complaint  Patient presents with   Hernia   at the request of Dr Deland Pretty   Supervising Physician: Sandi Mariscal  Patient Status: [facility] - In-pt  History of Present Illness: Brandon Rojas is a 73 y.o. male   RUQ and epigastric pain Leukocytosis + Murphys sign on exam Confusion  [place]:  IMPRESSION: Cholelithiasis. Gallbladder wall mildly thickened and subtly edematous. Suspect a degree of acute cholecystitis given this appearance.  CT:  IMPRESSION: 1. Cholelithiasis with hazy pericholecystic fat stranding and mucosal enhancement raising the possibility for acute cholecystitis. Correlation with laboratory values recommended. Additionally, further evaluation with dedicated right upper quadrant ultrasound could be performed as clinically warranted. 2. Large hiatal hernia. 3. Moderate to large volume retained stool within the distal colon, suggesting constipation. 4. Colonic diverticulosis without evidence for acute diverticulitis. 5. Advanced atherosclerosis with 3 vessel coronary artery Calcifications.  On Plavix-- LD 05/20/18 INR 1.2  Request for percutaneous cholecystostomy drain placement per Dr Deland Pretty Dr Pascal Lux discussed with Dr Unk Lightning need to move ahead despite Plavix    Past Medical History:  Diagnosis Date   Anxiety state, unspecified    Arthritis    Benign neoplasm of colon    CAD (coronary artery disease) of artery bypass graft 05/20/2018   Coronary atherosclerosis of unspecified type of vessel, native or graft    Depressive disorder, not elsewhere classified    Diaphragmatic hernia without mention of obstruction or gangrene    Diverticulosis of colon (without mention of hemorrhage)    Elevated troponin 05/20/2018   Epigastric pain 05/20/2018   Esophageal reflux    Esophageal stricture    Hiatal hernia    Mitral regurgitation      Osteoarthrosis, unspecified whether generalized or localized, unspecified site    Other and unspecified hyperlipidemia    Parkinson's disease ([place])    Personal history of colonic polyps    Personal history of unspecified circulatory disease    Unspecified essential hypertension     Past Surgical History:  Procedure Laterality Date   COLONOSCOPY     CORONARY ARTERY BYPASS GRAFT     x 2   INGUINAL HERNIA REPAIR     bilateral   LUNG BIOPSY     POLYPECTOMY     PTCA      Allergies: Atorvastatin and Simvastatin  Medications: Prior to Admission medications   Medication Sig Start Date End Date Taking? Authorizing Provider  aspirin 81 MG tablet Take 81 mg by mouth daily.     Yes [provider]  B Complex Vitamins (VITAMIN B COMPLEX PO) Take 1 tablet by mouth every other day.    Yes [provider]  carbidopa-levodopa (SINEMET IR) 25-100 MG tablet Take 1 tablet by mouth 3 (three) times daily. 05/06/18  Yes Tat, Eustace Quail, DO  clopidogrel (PLAVIX) 75 MG tablet TAKE 1 TABLET EVERY DAY WITH BREAKFAST Patient taking differently: Take 75 mg by mouth daily.  04/29/18  Yes Biagio Borg, MD  esomeprazole (NEXIUM) 40 MG capsule Take 1 capsule (40 mg total) by mouth daily. 05/06/17  Yes Biagio Borg, MD  lovastatin (MEVACOR) 20 MG tablet TAKE 1 TABLET AT BEDTIME Patient taking differently: Take 20 mg by mouth at bedtime.  04/29/18  Yes Biagio Borg, MD  NITROSTAT 0.4 MG SL tablet PLACE 1 TABLET (0.4 MG TOTAL) UNDER THE TONGUE EVERY 5 (FIVE) MINUTES AS NEEDED. Patient taking  differently: Place 0.4 mg under the tongue every 5 (five) minutes as needed for chest pain.  09/21/14  Yes Minus Breeding, MD  Omega-3 Fatty Acids (FISH OIL) 1000 MG CAPS Take 1,000-2,000 mg by mouth 2 (two) times a week.    Yes [provider]  tetrahydrozoline-zinc (VISINE-AC) 0.05-0.25 % ophthalmic solution Place 2 drops into both eyes 3 (three) times daily as needed (for burning or  eye redness).   Yes [provider]  tiZANidine (ZANAFLEX) 2 MG tablet Take 1 tablet (2 mg total) by mouth every 6 (six) hours as needed for muscle spasms. 11/12/17  Yes Biagio Borg, MD  traMADol (ULTRAM) 50 MG tablet Take 1 tablet (50 mg total) by mouth every 6 (six) hours as needed. for pain Patient taking differently: Take 50 mg by mouth every 6 (six) hours as needed (for pain).  05/13/18  Yes Biagio Borg, MD  vitamin E 400 UNIT capsule Take 400 Units by mouth 2 (two) times a week.    Yes [provider]     Family History  Problem Relation Age of Onset   Heart attack Mother    Stroke Father    Colon cancer Sister    Healthy Sister    Heart disease Brother    Healthy Brother    Healthy Brother    Healthy Brother    Healthy Daughter    Fibromyalgia Daughter    Multiple sclerosis Son     Social History   Socioeconomic History   Marital status: Married    Spouse name: Not on file   Number of children: 3   Years of education: Not on file   Highest education level: Not on file  Occupational History   Occupation: PASTOR    Employer: [employer] resource strain: Not on file   Food insecurity:    Worry: Not on file    Inability: Not on file   Transportation needs:    Medical: Not on file    Non-medical: Not on file  Tobacco Use   Smoking status: Former Smoker    Last attempt to quit: 02/19/1972    Years since quitting: 46.2   Smokeless tobacco: Never Used  Substance and Sexual Activity   Alcohol use: No    Alcohol/week: 0.0 standard drinks   Drug use: No   Sexual activity: Not on file  Lifestyle   Physical activity:    Days per week: Not on file    Minutes per session: Not on file   Stress: Not on file  Relationships   Social connections:    Talks on phone: Not on file    Gets together: Not on file    Attends religious service: Not on file    Active member of club or  organization: Not on file    Attends meetings of clubs or organizations: Not on file    Relationship status: Not on file  Other Topics Concern   Not on file  Social History Narrative   Not on file    Review of Systems: A 12 point ROS discussed and pertinent positives are indicated in the HPI above.  All other systems are negative.  Review of Systems  Constitutional: Positive for activity change and appetite change. Negative for fever.  Respiratory: Negative for cough and shortness of breath.   Cardiovascular: Negative for chest pain.  Gastrointestinal: Positive for abdominal pain and nausea.  Neurological: Positive for weakness.  Psychiatric/Behavioral: Positive for decreased concentration. Negative for behavioral problems and confusion.    Vital Signs: BP (!) 153/69 (BP Location: Left Arm)    Pulse 70    Temp 98.7 F (37.1 C) (Oral)    Resp 18    Ht 5\' 4"  (1.626 m)    Wt 146 lb 9.7 oz (66.5 kg)    SpO2 96%    BMI 25.16 kg/m   Physical Exam Vitals signs reviewed.  Cardiovascular:     Rate and Rhythm: Normal rate and regular rhythm.     Heart sounds: Normal heart sounds.  Pulmonary:     Breath sounds: Normal breath sounds.  Abdominal:     Tenderness: There is abdominal tenderness. There is guarding.  Musculoskeletal: Normal range of motion.  Skin:    General: Skin is warm and dry.  Neurological:     Mental Status: He is alert.     Comments: Sleepy- mildly confused  Psychiatric:     Comments: Consented with wife Brandon Rojas via phone     Imaging: Dg Chest 2 View  Result Date: 05/20/2018 CLINICAL DATA:  Epigastric pain.  History of hiatal hernia. EXAM: CHEST - 2 VIEW COMPARISON:  CT chest 01/27/2014.  PA and lateral chest 01/15/2014. FINDINGS: The patient is status post CABG. There is cardiomegaly and atherosclerosis. Lungs are clear. Moderate hiatal hernia is identified and appears increased in size since the prior study. No acute bony abnormality. IMPRESSION: Moderate  hiatal hernia appears somewhat increased compared to the prior exams. Cardiomegaly without edema. Atherosclerosis. Electronically Signed   By: Inge Rise M.D.   On: 05/20/2018 15:36   Ct Abdomen Pelvis W Contrast  Result Date: 05/20/2018 CLINICAL DATA:  Initial evaluation for acute upper abdominal pain. EXAM: CT ABDOMEN AND PELVIS WITH CONTRAST TECHNIQUE: Multidetector CT imaging of the abdomen and pelvis was performed using the standard protocol following bolus administration of intravenous contrast. CONTRAST:  131mL OMNIPAQUE IOHEXOL 300 MG/ML  SOLN COMPARISON:  None available. FINDINGS: Lower chest: Scattered atelectatic changes seen dependently within the visualized lung bases, left greater than right. Visualized lungs are otherwise clear. Cardiomegaly with 3 vessel coronary artery calcifications partially visualized. Hepatobiliary: Liver demonstrates a normal contrast enhanced appearance. Focal fat deposition noted adjacent to the falciform ligament. Subcentimeter radiopaque density noted within the gallbladder lumen, compatible with cholelithiasis. Gallbladder itself is somewhat distended with hazy pericholecystic fat stranding and mucosal enhancement, raising the possibility for possible acute cholecystitis. No significant common bile duct dilatation. Mild intrahepatic biliary dilatation. Pancreas: Pancreas within normal limits. No abnormal pancreatic ductal dilatation. Spleen: Spleen within normal limits. Adrenals/Urinary Tract: Adrenal glands are normal. Left kidney mildly atrophic as compared to the right. Symmetric enhancement seen within the kidneys bilaterally. No nephrolithiasis, hydronephrosis or focal enhancing renal mass. No hydroureter. Bladder within normal limits. Stomach/Bowel: Large hiatal hernia. Stomach otherwise unremarkable. No evidence for bowel obstruction. Normal appendix. Colonic diverticulosis without evidence for acute diverticulitis. No acute inflammatory changes seen about  the bowels. Moderate to large volume retained stool within the distal colon, suggesting constipation. Vascular/Lymphatic: Advanced atherosclerotic change throughout the intra-abdominal aorta and its branch vessels. No aneurysm. Mesenteric vessels grossly patent proximally. No adenopathy. Reproductive: Prostate mildly enlarged measuring 5 cm in transverse diameter. Other: No free air or fluid. Musculoskeletal: No acute osseous abnormality. No discrete lytic or blastic osseous lesions. Moderate thoracolumbar levoscoliosis, apex at L2. IMPRESSION: 1. Cholelithiasis with hazy pericholecystic fat stranding and mucosal enhancement raising the possibility for acute cholecystitis. Correlation with laboratory values recommended. Additionally,  further evaluation with dedicated right upper quadrant ultrasound could be performed as clinically warranted. 2. Large hiatal hernia. 3. Moderate to large volume retained stool within the distal colon, suggesting constipation. 4. Colonic diverticulosis without evidence for acute diverticulitis. 5. Advanced atherosclerosis with 3 vessel coronary artery calcifications. Electronically Signed   By: Jeannine Boga M.D.   On: 05/20/2018 17:22   Dg Chest Port 1 View  Result Date: 05/22/2018 CLINICAL DATA:  73 year old male with altered mental status. EXAM: PORTABLE CHEST 1 VIEW COMPARISON:  Chest radiographs 05/20/2018 and earlier. Chest CT 01/27/2014. FINDINGS: Portable AP semi upright view at 0830 hours. Mildly lower lung volumes. Stable cardiac size and mediastinal contours. Cardiomegaly and chronic hiatal hernia. Prior CABG. No pneumothorax. Stable pulmonary vascularity without overt edema. Stable streaky mostly left lung base opacity. No definite pleural effusion. Visible bowel-gas pattern within normal limits. Stable visualized osseous structures. Calcified aortic atherosclerosis. IMPRESSION: 1. Lower lung volumes with lung base opacity, favor atelectasis. 2. No new  cardiopulmonary abnormality. 3.  Aortic Atherosclerosis (ICD10-I70.0). Electronically Signed   By: Genevie Ann M.D.   On: 05/22/2018 08:59   US Abdomen Limited Ruq  Result Date: 05/20/2018 CLINICAL DATA:  Upper abdominal/epigastric pain EXAM: ULTRASOUND ABDOMEN LIMITED RIGHT UPPER QUADRANT COMPARISON:  CT abdomen and pelvis May 20, 2018 FINDINGS: Gallbladder: Within the gallbladder, there are echogenic foci which move and shadow consistent with cholelithiasis. Largest gallstone measures 1.1 cm in length. The gallbladder wall is thickened and subtly edematous. There is no appreciable pericholecystic fluid. No sonographic Murphy sign noted by sonographer. Common bile duct: Diameter: 6 mm. No intrahepatic or extrahepatic biliary duct dilatation. Liver: No focal lesion identified. Within normal limits in parenchymal echogenicity. Portal vein is patent on color Doppler imaging with normal direction of blood flow towards the liver. IMPRESSION: Cholelithiasis. Gallbladder wall mildly thickened and subtly edematous. Suspect a degree of acute cholecystitis given this appearance. Study otherwise unremarkable. Electronically Signed   By: Lowella Grip III M.D.   On: 05/20/2018 19:15    Labs:  CBC: Recent Labs    05/15/18 1057 05/20/18 1453 05/21/18 0407 05/22/18 0637  WBC 8.8 16.1* 16.8* 15.7*  HGB 15.3 14.5 14.3 12.9*  HCT 45.0 42.7 42.2 37.1*  PLT 230.0 213 209 167    COAGS: Recent Labs    05/22/18 1314  INR 1.2    BMP: Recent Labs    05/15/18 1057 05/20/18 1453 05/21/18 0407 05/22/18 0637  NA 139 138 138 138  K 4.6 4.0 3.8 3.8  CL 104 103 103 104  CO2 28 23 25 25   GLUCOSE 89 121* 124* 104*  BUN 17 11 13 13   CALCIUM 9.5 9.9 9.4 8.8*  CREATININE 1.07 1.05 1.08 1.05  GFRNONAA  --  >60 >60 >60  GFRAA  --  >60 >60 >60    LIVER FUNCTION TESTS: Recent Labs    05/15/18 1057 05/20/18 1453 05/21/18 0407 05/22/18 0637  BILITOT 0.6 0.9 0.8 1.0  AST 22 23 20 21   ALT 4 8 26 21    ALKPHOS 65 60 58 64  PROT 6.6 6.5 5.9* 5.4*  ALBUMIN 4.1 3.6 3.1* 2.5*    TUMOR MARKERS: No results for input(s): AFPTM, CEA, CA199, CHROMGRNA in the last 8760 hours.  Assessment and Plan:  RUQ and epigastric pain Imaging reveals cholecystitis with cholelithiasis LD Plavix 5/12 Need to move ahead with percutaneous procedure per Dr Dema Severin Pt acutely ill  Risks and benefits discussed with the patient's wife Brandon Rojas via phone including, but  not limited to bleeding, infection, gallbladder perforation, bile leak, sepsis or even death.  All of her questions were answered, she is agreeable to proceed. Consent signed and in chart.   Thank you for this interesting consult.  I greatly enjoyed meeting Brandon Rojas and look forward to participating in their care.  A copy of this report was sent to the requesting provider on this date.  Electronically Signed: Lavonia Drafts, PA-C 05/22/2018, 3:37 PM   I spent a total of 40 Minutes    in face to face in clinical consultation, greater than 50% of which was counseling/coordinating care for perc chole drain

## 2018-05-22 NOTE — Progress Notes (Signed)
I have spoken with the patients wife, Kermit Balo, and updated her on the plan and patient's current state. I spent time to ensure all questions were answered. She asks that she be contacted on her cell phone at [phone] for further updates daily.

## 2018-05-22 NOTE — Progress Notes (Addendum)
Patient returned from procedure - Drain in Right side.  Patient very out of it.   Will not follow commands, wanting to get up to urinate.   Patient reaching for condom cath.   Tried to reorient patient, but not responding.   Placed bed in lowest position, bed railing up, mat beside bed and mit on to prevent him from pulling off condom cath.    Vital signs obtain- BP up.   Will page MD to let them know   MD oked to give PRN hydralazine for elevated BP

## 2018-05-23 ENCOUNTER — Telehealth: Payer: Self-pay | Admitting: Neurology

## 2018-05-23 LAB — COMPREHENSIVE METABOLIC PANEL
ALT: <5 U/L (ref 0–44)
AST: 24 U/L (ref 15–41)
Albumin: 2.4 g/dL — ABNORMAL LOW (ref 3.5–5.0)
Alkaline Phosphatase: 70 U/L (ref 38–126)
Anion gap: 8 (ref 5–15)
BUN: 11 mg/dL (ref 8–23)
CO2: 22 mmol/L (ref 22–32)
Calcium: 8.5 mg/dL — ABNORMAL LOW (ref 8.9–10.3)
Chloride: 106 mmol/L (ref 98–111)
Creatinine, Ser: 1.05 mg/dL (ref 0.61–1.24)
GFR calc Af Amer: >60 mL/min (ref >60)
GFR calc non Af Amer: >60 mL/min (ref >60)
Glucose, Bld: 121 mg/dL — ABNORMAL HIGH (ref 70–99)
Potassium: 3.4 mmol/L — ABNORMAL LOW (ref 3.5–5.1)
Sodium: 136 mmol/L (ref 135–145)
Total Bilirubin: 1 mg/dL (ref 0.3–1.2)
Total Protein: 5.6 g/dL — ABNORMAL LOW (ref 6.5–8.1)

## 2018-05-23 LAB — GLUCOSE, CAPILLARY
Glucose-Capillary: 110 mg/dL — ABNORMAL HIGH (ref 70–99)
Glucose-Capillary: 121 mg/dL — ABNORMAL HIGH (ref 70–99)
Glucose-Capillary: 190 mg/dL — ABNORMAL HIGH (ref 70–99)

## 2018-05-23 LAB — CBC
HCT: 38 % — ABNORMAL LOW (ref 39.0–52.0)
Hemoglobin: 13.1 g/dL (ref 13.0–17.0)
MCH: 30.6 pg (ref 26.0–34.0)
MCHC: 34.5 g/dL (ref 30.0–36.0)
MCV: 88.8 fL (ref 80.0–100.0)
Platelets: 188 10*3/uL (ref 150–400)
RBC: 4.28 MIL/uL (ref 4.22–5.81)
RDW: 13.7 % (ref 11.5–15.5)
WBC: 9.3 10*3/uL (ref 4.0–10.5)
nRBC: 0 % (ref 0.0–0.2)

## 2018-05-23 MED ORDER — POTASSIUM CHLORIDE CRYS ER 20 MEQ PO TBCR
40.0000 meq | EXTENDED_RELEASE_TABLET | Freq: Once | ORAL | Status: AC
  Administered 2018-05-23: 40 meq via ORAL
  Filled 2018-05-23: qty 2

## 2018-05-23 MED ORDER — LIDOCAINE-EPINEPHRINE (PF) 2 %-1:200000 IJ SOLN
INTRAMUSCULAR | Status: AC
  Filled 2018-05-23: qty 20

## 2018-05-23 MED ORDER — LABETALOL HCL 5 MG/ML IV SOLN
INTRAVENOUS | Status: AC
  Filled 2018-05-23: qty 4

## 2018-05-23 MED ORDER — DIPHENHYDRAMINE HCL 50 MG/ML IJ SOLN
INTRAMUSCULAR | Status: AC
  Filled 2018-05-23: qty 1

## 2018-05-23 MED ORDER — LIDOCAINE HCL (PF) 1 % IJ SOLN
INTRAMUSCULAR | Status: AC
  Filled 2018-05-23: qty 5

## 2018-05-23 MED ORDER — KETOROLAC TROMETHAMINE 30 MG/ML IJ SOLN
INTRAMUSCULAR | Status: AC
  Filled 2018-05-23: qty 1

## 2018-05-23 MED ORDER — BISACODYL 10 MG RE SUPP: 10.0000 mg | Freq: Every day | RECTAL | Status: DC | PRN

## 2018-05-23 MED ORDER — POLYETHYLENE GLYCOL 3350 17 G PO PACK
17.0000 g | PACK | Freq: Every day | ORAL | Status: DC
  Administered 2018-05-23 – 2018-05-24 (×2): 17 g via ORAL
  Filled 2018-05-23 (×2): qty 1

## 2018-05-23 NOTE — Progress Notes (Signed)
Patient ID: Brandon Rojas, male   DOB: Oct 01, 1945, 73 y.o.   MRN: [id number]   IR Round note via phone New regulations Spoke to Loraine chole drain placed yesterday in IR RN says pt feeling better already  OP bile 30 cc this am-- none recorded yesterday New orders placed for flush and record OP  Site is clean and dry NT No bleeding  Will follow few days  Drain will remain for 6-8 weeks-- unless to OR first

## 2018-05-23 NOTE — Progress Notes (Signed)
HHC choice offered, pt spouse chose [facility]; [place] with [facility] called for arrangements. Aneta Mins [phone]  [facility]  [phone]   [place] to my Favorites Quality of Patient Care Rating3 out of 5 stars Patient Survey Summary Rating4 out of [place]  [phone]   [place] to my Favorites Quality of Patient Care Rating4 out of 5 stars Patient Survey Summary Rating4 out of [place]  [phone]   [place] to my Favorites Quality of Patient Care Rating4 out of 5 stars Patient Survey Summary Rating4 out of 5 stars  [place]  [phone]   Add [place] to my Favorites Quality of Patient Care Rating4 out of 5 stars Patient Survey Summary Rating4 out of 5 stars  [place]  [phone]   [place] to my Favorites Quality of Patient Care Rating4 out of 5 stars Patient Survey Summary Rating4 out of [place]  [phone]   [place] to my [place] out of 5 stars Patient Survey Summary Rating4 out of [place]  [phone]   [place] to my Favorites Quality of Patient Care Rating3 out of 5 stars Patient Survey Summary [place] out of 5 stars  [facility]  [phone]) [phone]   Add [facility] to my Favorites Quality of Patient Care Rating4 out of [place]  [phone]   [place] to my Favorites Quality of Patient [place] out of 5 stars Patient Survey Summary Rating4 out of 5 stars  [facility]  [phone]   Add [facility] to my Favorites Quality of Patient Care Rating3  out of 5  stars Patient Survey Summary Rating3 out of [place]  [phone]   [place] to my [place]  out of 5 stars Patient Survey Summary Rating3 out of [place]  [phone]   Add [facility] to my Favorites Quality of Patient Care Rating4 out of 5 stars Patient Survey Summary Rating5 out of [place]  [phone]

## 2018-05-23 NOTE — TOC Progression Note (Signed)
Transition of Care [facility]) - Progression Note    Patient Details  Name: Brandon Rojas MRN: [id number] Date of Birth: 07/23/1945  Transition of Care [facility]) CM/SW Contact  Sherrilyn Rist Phone Number: [phone] 05/23/2018, 2:26 PM  Clinical Narrative:    CM talked to patient about [place] choices, he requested that I talk to his wife Brandon Rojas; [place], she chose [facility]; [place] with [facility] called for arrangements; DME - cane and rollater at home.   Expected Discharge Plan: [place] Barriers to Discharge: No Barriers Identified  Expected Discharge Plan and Services Expected Discharge Plan: [place] In-house Referral: NA Discharge Planning Services: CM Consult Post Acute Care Choice: NA Living arrangements for the past 2 months: Single Family Home Expected Discharge Date: 05/23/18               DME Arranged: N/A DME Agency: NA       HH Arranged: RN [place] Agency: [facility] [employer] Date [place]: 05/23/18 Time [place]: 7711 Representative spoke with at [place]: Tiffany RN   Social Determinants of Health ([place]) Interventions    Readmission Risk Interventions No flowsheet data found.

## 2018-05-23 NOTE — Final Consult Note (Addendum)
Consultant Final Sign-Off Note    Assessment/Final recommendations  MARK HASSEY is a 73 y.o. male followed by me for Cholecystitis with cholelithiasis   Wound care (if applicable): N/A   Diet at discharge: low fat    Activity at discharge: per primary team   Follow-up appointment: Dr. Dema Severin Will need follow up with IR for drain study prior to appointment per IR team.    Pending results:  Unresulted Labs (From admission, onward)   None       Medication recommendations: Continue IV abx today and then would transition to PO abx. Will need 10 days PO abx. Okay to restart Plavix from General Surgery standpoint.    Other recommendations: Patient and family will need to learn drain care/flushing per IR     Thank you for allowing [place] to participate in the care of your patient!  Please consult [place] again if you have further needs for your patient.  Barth Kirks [facility] 05/23/2018 1:38 PM

## 2018-05-23 NOTE — Evaluation (Signed)
Physical Therapy Evaluation Patient Details Name: Brandon Rojas MRN: [id number] DOB: 1945-06-17 Today's Date: 05/23/2018   History of Present Illness  73 y.o. male with story of CAD status post CABG, hypertension, Parkinson's disease presents to the ER with complaints of epigastric pain ongoing for last 2 days. Dx with acute cholecystitis, poor surgical candidate so drainage catheter placed into gallbladder.  Clinical Impression  PTA pt lived with wife and son in multistory home with bed and bath on first floor with 3 steps to enter with rails on both sides. Pt independent with household ambulation and ADLs. PT confirmed information with wife Kermit Balo on the phone. Pt currently is limited in safe mobility by generalized weakness on top of Parkinsonian movement. Pt is modA for bed mobility, min A for transfers and min A for ambulation of 18 feet with RW and constant cuing for larger steps. Pt's family is anxious to have him home and will provide 24 hour assist. PT recommending HHPT level rehabilitation to return to PLOF. PT will follow back tomorrow to practice stairs before going home.      Follow Up Recommendations Home health PT;Supervision/Assistance - 24 hour    Equipment Recommendations  None recommended by PT       Precautions / Restrictions Precautions Precautions: Fall Restrictions Weight Bearing Restrictions: No      Mobility  Bed Mobility Overal bed mobility: Needs Assistance Bed Mobility: Supine to Sit     Supine to sit: Mod assist     General bed mobility comments: modA for bringing LE off bed, bringing trunk to upright and for pad scoot of hips to EoB  Transfers Overall transfer level: Needs assistance Equipment used: Rolling walker (2 wheeled) Transfers: Sit to/from Stand Sit to Stand: Min assist         General transfer comment: minA for power up and steadying at RW, vc for hand placement for power up  Ambulation/Gait Ambulation/Gait assistance: Min  assist Gait Distance (Feet): 18 Feet Assistive device: Rolling walker (2 wheeled) Gait Pattern/deviations: Step-through pattern;Decreased step length - right;Decreased step length - left;Shuffle;Trunk flexed;Narrow base of support Gait velocity: slowed Gait velocity interpretation: <1.31 ft/sec, indicative of household ambulator General Gait Details: minA for steadying with RW, constant verbal cuing for larger steps due to Parkinsonian gait       Balance Overall balance assessment: Needs assistance Sitting-balance support: No upper extremity supported;Feet supported Sitting balance-Leahy Scale: Fair     Standing balance support: Bilateral upper extremity supported Standing balance-Leahy Scale: Poor Standing balance comment: pt requires UE support to maintain balance                             Pertinent Vitals/Pain Pain Assessment: No/denies pain    Home Living Family/patient expects to be discharged to:: Private residence Living Arrangements: Spouse/significant other Available Help at Discharge: Family;Available 24 hours/day Type of Home: House Home Access: Stairs to enter Entrance Stairs-Rails: Can reach both Entrance Stairs-Number of Steps: 3 Home Layout: Laundry or work area in basement;Able to live on main level with bedroom/bathroom;Two level Home Equipment: Walker - 4 wheels;Cane - single point;Grab bars - tub/shower      Prior Function Level of Independence: Needs assistance   Gait / Transfers Assistance Needed: ambulating household distances without AD  ADL's / Homemaking Assistance Needed: wife assists with ADLS           Extremity/Trunk Assessment   Upper Extremity Assessment Upper Extremity Assessment: Generalized  weakness    Lower Extremity Assessment Lower Extremity Assessment: Generalized weakness       Communication   Communication: Other (comment)(soft phonation )  Cognition Arousal/Alertness: Awake/alert Behavior During  Therapy: Flat affect Overall Cognitive Status: Impaired/Different from baseline Area of Impairment: Attention;Following commands;Problem solving;Awareness                   Current Attention Level: Alternating   Following Commands: Follows one step commands consistently;Follows one step commands with increased time;Follows multi-step commands with increased time   Awareness: Emergent Problem Solving: Slow processing;Decreased initiation;Difficulty sequencing;Requires verbal cues;Requires tactile cues General Comments: pt with history of confusion during hospital stay, pt reports men working pouring concrete on the roof outside his window at 3 oclock in morning,              Assessment/Plan    PT Assessment Patient needs continued PT services  PT Problem List Decreased strength;Decreased activity tolerance;Decreased balance;Decreased mobility       PT Treatment Interventions DME instruction;Gait training;Stair training;Functional mobility training;Therapeutic activities;Therapeutic exercise;Balance training;Cognitive remediation;Patient/family education    PT Goals (Current goals can be found in the Care Plan section)  Acute Rehab PT Goals Patient Stated Goal: go home to be with wife PT Goal Formulation: With patient Time For Goal Achievement: 06/06/18 Potential to Achieve Goals: Good    Frequency Min 3X/week    AM-PAC PT "6 Clicks" Mobility  Outcome Measure Help needed turning from your back to your side while in a flat bed without using bedrails?: A Little Help needed moving from lying on your back to sitting on the side of a flat bed without using bedrails?: A Lot Help needed moving to and from a bed to a chair (including a wheelchair)?: A Little Help needed standing up from a chair using your arms (e.g., wheelchair or bedside chair)?: A Little Help needed to walk in hospital room?: A Little Help needed climbing 3-5 steps with a railing? : A Lot 6 Click Score:  16    End of Session Equipment Utilized During Treatment: Gait belt Activity Tolerance: Patient tolerated treatment well Patient left: in chair;with call bell/phone within reach;with chair alarm set Nurse Communication: Mobility status PT Visit Diagnosis: Unsteadiness on feet (R26.81);Other abnormalities of gait and mobility (R26.89);Muscle weakness (generalized) (M62.81);Difficulty in walking, not elsewhere classified (R26.2);Other symptoms and signs involving the nervous system (X21.194)    Time: 1740-8144 PT Time Calculation (min) (ACUTE ONLY): 29 min   Charges:   PT Evaluation $PT Eval Moderate Complexity: 1 Mod PT Treatments $Gait Training: 8-22 mins        Erion Hermans B. Migdalia Dk PT, DPT Acute Rehabilitation Services Pager [phone] Office [phone]   [place] 05/23/2018, 3:26 PM

## 2018-05-23 NOTE — Telephone Encounter (Signed)
Called spoke with patient spouse Damarien Nyman she was concern that patient was not getting his Parkinson medication. She was concerned that the nurses was not given his medication as prescribed . She states that she will call them back to make sure that they are given him his medication as he would if he was at home. Pt spouse was informed that the hospital should have a list of current medicaiton for the patient what and when each medication should be given. [place] understand just needed reassurance  since she is unable to visit him at this time.

## 2018-05-23 NOTE — Telephone Encounter (Signed)
Wife left msg with after hours about patient being in the hospital and he is not taking his meds. This has been since Tuesday due to the hospital not having it on his chart. The name of med was not left. Please call her back at [phone].

## 2018-05-23 NOTE — Progress Notes (Signed)
Progress Note  Patient Name: Brandon Rojas Date of Encounter: 05/23/2018  Primary Cardiologist: Minus Breeding, MD   Subjective   Comfortable this AM, conversant, feeling much better with the drain in place.  Inpatient Medications    Scheduled Meds:  B-complex with vitamin C  1 tablet Oral QODAY   carbidopa-levodopa  1 tablet Oral TID   docusate sodium  100 mg Oral BID   polyethylene glycol  17 g Oral Daily   pravastatin  20 mg Oral q1800   sodium chloride flush  5 mL Intracatheter Q8H   Continuous Infusions:  sodium chloride Stopped (05/22/18 1510)   dextrose 5 % and 0.45% NaCl 50 mL/hr at 05/23/18 0627   piperacillin-tazobactam (ZOSYN)  IV 3.375 g (05/23/18 0355)   PRN Meds: sodium chloride, acetaminophen **OR** acetaminophen, bisacodyl, hydrALAZINE, iohexol, morphine injection, naphazoline-glycerin, oxyCODONE   Vital Signs    Vitals:   05/22/18 1832 05/22/18 1900 05/22/18 2002 05/23/18 0418  BP: 139/73 111/81 139/69 (!) 153/80  Pulse: 85 75 81 61  Resp:   18 18  Temp:   98.8 F (37.1 C) 98.9 F (37.2 C)  TempSrc:   Oral Oral  SpO2:   93% 95%  Weight:    69 kg  Height:        Intake/Output Summary (Last 24 hours) at 05/23/2018 1040 Last data filed at 05/23/2018 0535 Gross per 24 hour  Intake 522.31 ml  Output 1000 ml  Net -477.69 ml   Last 3 Weights 05/23/2018 05/22/2018 05/21/2018  Weight (lbs) 152 lb 1.9 oz 146 lb 9.7 oz 143 lb 14.4 oz  Weight (kg) 69 kg 66.5 kg 65.273 kg      Telemetry    NSR with a lot of artifact - Personally Reviewed  ECG    No new since 5/12 - Personally Reviewed  Physical Exam   GEN: No acute distress.   Neck: No JVD Cardiac: RRR, no murmurs, rubs, or gallops.  Respiratory: Clear to auscultation bilaterally. GI: Soft, mildly TTP in RUQ, perc drain in place with bilious output, +bowel sounds  MS: No edema; No deformity. Neuro:  Nonfocal  Psych: Normal affect   Labs    Chemistry Recent Labs  Lab  05/21/18 0407 05/22/18 0637 05/23/18 0417  NA 138 138 136  K 3.8 3.8 3.4*  CL 103 104 106  CO2 25 25 22   GLUCOSE 124* 104* 121*  BUN 13 13 11   CREATININE 1.08 1.05 1.05  CALCIUM 9.4 8.8* 8.5*  PROT 5.9* 5.4* 5.6*  ALBUMIN 3.1* 2.5* 2.4*  AST 20 21 24   ALT 26 21 <5  ALKPHOS 58 64 70  BILITOT 0.8 1.0 1.0  GFRNONAA >60 >60 >60  GFRAA >60 >60 >60  ANIONGAP 10 9 8      Hematology Recent Labs  Lab 05/21/18 0407 05/22/18 0637 05/23/18 0417  WBC 16.8* 15.7* 9.3  RBC 4.67 4.13* 4.28  HGB 14.3 12.9* 13.1  HCT 42.2 37.1* 38.0*  MCV 90.4 89.8 88.8  MCH 30.6 31.2 30.6  MCHC 33.9 34.8 34.5  RDW 13.9 13.9 13.7  PLT 209 167 188    Cardiac Enzymes Recent Labs  Lab 05/20/18 1950 05/20/18 2212 05/21/18 0407 05/21/18 0943  TROPONINI 0.05* 0.06* 0.06* 0.05*   No results for input(s): TROPIPOC in the last 168 hours.   BNPNo results for input(s): BNP, PROBNP in the last 168 hours.   DDimer No results for input(s): DDIMER in the last 168 hours.   Radiology  Ir Perc Cholecystostomy  Result Date: 05/22/2018 INDICATION: Acute cholecystitis, poor operative candidate. Request made for image guided placement of cholecystostomy tube for infection source control purposes. EXAM: ULTRASOUND AND FLUOROSCOPIC-GUIDED CHOLECYSTOSTOMY TUBE PLACEMENT COMPARISON:  None. MEDICATIONS: The patient is currently admitted to the hospital and on intravenous antibiotics. Antibiotics were administered within an appropriate time frame prior to skin puncture. ANESTHESIA/SEDATION: Moderate (conscious) sedation was employed during this procedure. A total of Versed 1 mg and Fentanyl 75 mcg was administered intravenously. Moderate Sedation Time: 12 minutes. The patient's level of consciousness and vital signs were monitored continuously by radiology nursing throughout the procedure under my direct supervision. CONTRAST:  15 cc Omnipaque 300 - administered into the gallbladder fossa. FLUOROSCOPY TIME:  42 seconds  (15 mGy) COMPLICATIONS: None immediate. PROCEDURE: Informed written consent was obtained from the patient after a discussion of the risks, benefits and alternatives to treatment. Questions regarding the procedure were encouraged and answered. A timeout was performed prior to the initiation of the procedure. The right upper abdominal quadrant was prepped and draped in the usual sterile fashion, and a sterile drape was applied covering the operative field. Maximum barrier sterile technique with sterile gowns and gloves were used for the procedure. A timeout was performed prior to the initiation of the procedure. Local anesthesia was provided with 1% lidocaine with epinephrine. Ultrasound scanning of the right upper quadrant demonstrates a markedly dilated gallbladder. Of note, the patient reported pain with ultrasound imaging over the gallbladder. Utilizing a transhepatic approach, a 22 gauge needle was advanced into the gallbladder under direct ultrasound guidance. An ultrasound image was saved for documentation purposes. Appropriate intraluminal puncture was confirmed with the efflux of bile and advancement of an 0.018 wire into the gallbladder lumen. The needle was exchanged for an [place] set. A small amount of contrast was injected to confirm appropriate intraluminal positioning. Over a Benson wire, a 78.2-French Cook cholecystomy tube was advanced into the gallbladder fossa, coiled and locked. Bile was aspirated and a small amount of contrast was injected as several post procedural spot radiographic images were obtained in various obliquities. The catheter was secured to the skin with suture, connected to a drainage bag and a dressing was placed. The patient tolerated the procedure well without immediate post procedural complication. IMPRESSION: Successful ultrasound and fluoroscopic guided placement of a 10.2 French cholecystostomy tube. Electronically Signed   By: Sandi Mariscal M.D.   On: 05/22/2018 16:50    Dg Chest Port 1 View  Result Date: 05/22/2018 CLINICAL DATA:  73 year old male with altered mental status. EXAM: PORTABLE CHEST 1 VIEW COMPARISON:  Chest radiographs 05/20/2018 and earlier. Chest CT 01/27/2014. FINDINGS: Portable AP semi upright view at 0830 hours. Mildly lower lung volumes. Stable cardiac size and mediastinal contours. Cardiomegaly and chronic hiatal hernia. Prior CABG. No pneumothorax. Stable pulmonary vascularity without overt edema. Stable streaky mostly left lung base opacity. No definite pleural effusion. Visible bowel-gas pattern within normal limits. Stable visualized osseous structures. Calcified aortic atherosclerosis. IMPRESSION: 1. Lower lung volumes with lung base opacity, favor atelectasis. 2. No new cardiopulmonary abnormality. 3.  Aortic Atherosclerosis (ICD10-I70.0). Electronically Signed   By: Genevie Ann M.D.   On: 05/22/2018 08:59    Cardiac Studies   ECHO:  07/09/2017 - Left ventricle: The cavity size was normal. Wall thickness was increased in a pattern of mild LVH. Systolic function was normal. The estimated ejection fraction was in the range of 55% to 60%. Wall motion was normal; there were no regional wall motion  abnormalities. Features are consistent with a pseudonormal left ventricular filling pattern, with concomitant abnormal relaxation and increased filling pressure (grade 2 diastolic dysfunction). - Mitral valve: Mildly calcified annulus. There was moderate regurgitation. - Left atrium: The atrium was severely dilated. - Right ventricle: The cavity size was mildly dilated. - Right atrium: The atrium was severely dilated. - Tricuspid valve: There was moderate regurgitation. - Pulmonary arteries: Systolic pressure was moderately increased. PA peak pressure: 44 mm Hg (S).  CATH: 03/08/2010 ANGIOGRAPHIC DATA: 1. The left main is free of critical disease. 2. The LAD is diffusely irregular proximally and diffusely  plaqued. Beyond this, there is a stent previously placed leading into a smaller caliber diagonal with about 60% proximal narrowing. The stent has about 40% in-stent re-narrowing. After the takeoff of the small diagonal, there is an 80% stenosis and had evidence of competitive filling distally. 3. The internal mammary to the distal LAD is widely patent, fills the LAD retrograde. 4. The circumflex is a dominant vessel. There is 80% narrowing prior to the first tiny marginal branch. The first marginal branch has 70% narrowing proximally and is only about 1.4-mm caliber vessel. The circumflex in this territory is somewhat diffusely diseased and then opens back up and has about 70% narrowing distally. 5. The saphenous vein graft to the distal posterolateral branch is widely patent without significant narrowing and retrograde fills the posterolateral branch into the PDA. 6. The right coronary artery is small dominant vessel without critical disease. 7. Ventriculography in the RAO projection reveals well-preserved global systolic function. CONCLUSION: 1. Well-preserved left ventricular function. 2. Continued patency of internal mammary to the LAD. 3. Continued patency of the saphenous vein graft to the posterolateral branch. 4. Small-caliber disease involving the first diagonal and first marginal that do not appear to be optimal for percutaneous intervention.  Patient Profile     Brandon Rojas is a 73 y.o. 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  Troponin was also elevated and Cardiology was asked to evaluate him.  Assessment & Plan    Elevated Troponin: trend is flat and low level at 0.05>>0.06>>0.06, not c/w ACS which has a rise and fall pattern. His  trend is c/w demand ischemia. Also denies any recent anginal CP. No plans for ischemic w/u.   CAD: s/p prior CABG. No anginal symptoms. Recent abdominal/ epigastric pain felt related to acute cholecystitis. Aspirin/clopidogrel currently on hold for anticipated surgery. Resume once safe to do so from a surgical standpoint, and would prefer to continue aspirin perioperatively given known CAD if safe from surgical standpoint.  Continue statin (pravastatin inpatient, lovastatin as an outpatient). Does not tolerate atorvastatin/simvastatin.  HTN: 153/80 this am. SBP as high as the 180s this admission. Can use PRN IV hydralazine when needed.  Was not on home antihypertensives.  Preop Assessment: no recent anginal symptoms. EKG w/o significant ST abnormalties. Troponin trend not c/w ACS. Echo last year showed normal EF at 55-60%, G2DD and moderate MR. No s/s of acute CHF. Ok to proceed with noncardiac surgery w/o need for further cardiac testing. Ok to hold clopidogrel for surgery. Would continue aspirin if able given his known CAD.  [facility] will sign off.   Medication Recommendations:  As noted, return to home meds when able Other recommendations (labs, testing, etc):  none Follow up as an  outpatient: as previously scheduled with Dr. Percival Spanish.  For questions or updates, please contact [place] Please consult [email or web address] for contact info under     Signed, Buford Dresser, MD  05/23/2018, 10:40 AM

## 2018-05-23 NOTE — Progress Notes (Signed)
PROGRESS NOTE  Brandon Rojas ZYS:[id number] DOB: 01/13/1945 DOA: 05/20/2018 PCP: Biagio Borg, MD  HPI/Recap of past 24 hours: Brandon Rojas is a 73 y.o. male with story of CAD status post CABG, hypertension, Parkinson's disease presents to the ER with complaints of epigastric pain ongoing for last 2 days.  Patient initially thought it was due to his hiatal hernia.  Denies any associated vomiting or diarrhea denies any fever chills.  Denies any chest pain.  ED Course: In the ER on exam patient has right upper quadrant tenderness.  CT abdomen followed by ultrasound shows features concerning for acute cholecystitis.  WBC count is elevated LFTs are normal.  On-call general surgeon was consulted.  Started on empiric antibiotics.  Given history of CAD troponin was done which shows mild elevation.  Cardiology was consulted.  At the time of my exam patient is tender in the abdomen denies any chest pain.  05/23/18: Patient seen and examined at his bedside.  He had a Perch chole drain placed in yesterday 05/22/2018.  Reports minimal pain in his right upper quadrant.  He is alert and oriented x3 states he feels better.  30 cc of output from his gallbladder drain.    Assessment/Plan: Principal Problem:   Acute cholecystitis Active Problems:   Parkinson's disease (HCC)   Elevated troponin   Epigastric pain   CAD (coronary artery disease) of artery bypass graft  Acute cholecystitis status post drainage catheter placement on 05/22/2018 By interventional radiology Continue Zosyn Pain management in place with IV morphine 2 mg every 2 hours as needed for severe pain  Resolved acute metabolic encephalopathy Possibly iatrogenic secondary to opiates Chest x-ray done this morning independently reviewed revealed cardiomegaly with mild increase in pulmonary vascularity Closely monitor Reorient as indicated Fall precautions  Resolved uncontrolled hypertension Vital signs are back to normal Possibly  contributed by pain which is now resolving IV hydralazine PRN for systolic blood pressure greater than 932 or diastolic greater than 355 Not on antihypertensive medications at home  Elevated troponin, ACS ruled out Peaked at 0.06 and trended down to 0.05 Denies chest pain  Hyperlipidemia Resume statin   Parkinson's disease Resume carbidopa- levodopa  DVT prophylaxis: Subcu Lovenox daily Code Status: Full code. Family Communication:  Updated the patient's wife on the phone on 05/23/2018. Disposition Plan: Home when general surgery and interventional radiology sign off Consults called: Cardiology, general surgery, interventional radiology     Objective: Vitals:   05/22/18 1900 05/22/18 2002 05/23/18 0418 05/23/18 1204  BP: 111/81 139/69 (!) 153/80 110/64  Pulse: 75 81 61 (!) 56  Resp:  18 18   Temp:  98.8 F (37.1 C) 98.9 F (37.2 C) 98.6 F (37 C)  TempSrc:  Oral Oral Oral  SpO2:  93% 95% 95%  Weight:   69 kg   Height:        Intake/Output Summary (Last 24 hours) at 05/23/2018 1338 Last data filed at 05/23/2018 0900 Gross per 24 hour  Intake 522.31 ml  Output 730 ml  Net -207.69 ml   Filed Weights   05/21/18 0618 05/22/18 0420 05/23/18 0418  Weight: 65.3 kg 66.5 kg 69 kg    Exam:  . General: 73 y.o. year-old male well-developed well-nourished in no acute distress.  Alert and oriented x3. . Cardiovascular: Regular rate and rhythm with no rubs or gallops.  No JVD or thyromegaly noted.   [name] Respiratory: Clear to auscultation with no wheezes or rales.  Poor respiratory effort. [name]  Abdomen: Gallbladder drain in place.  Minimal tenderness on palpation.  Psychiatry: Mood is appropriate for condition and setting.   Data Reviewed: CBC: Recent Labs  Lab 05/20/18 1453 05/21/18 0407 05/22/18 0637 05/23/18 0417  WBC 16.1* 16.8* 15.7* 9.3  NEUTROABS 14.3*  --  13.6*  --   HGB 14.5 14.3 12.9* 13.1  HCT 42.7 42.2 37.1* 38.0*  MCV 90.5 90.4 89.8 88.8  PLT 213 209 167  462   Basic Metabolic Panel: Recent Labs  Lab 05/20/18 1453 05/21/18 0407 05/22/18 0637 05/23/18 0417  NA 138 138 138 136  K 4.0 3.8 3.8 3.4*  CL 103 103 104 106  CO2 23 25 25 22   GLUCOSE 121* 124* 104* 121*  BUN 11 13 13 11   CREATININE 1.05 1.08 1.05 1.05  CALCIUM 9.9 9.4 8.8* 8.5*   GFR: Estimated Creatinine Clearance: 51.7 mL/min (by C-G formula based on SCr of 1.05 mg/dL). Liver Function Tests: Recent Labs  Lab 05/20/18 1453 05/21/18 0407 05/22/18 0637 05/23/18 0417  AST 23 20 21 24   ALT 8 26 21  <5  ALKPHOS 60 58 64 70  BILITOT 0.9 0.8 1.0 1.0  PROT 6.5 5.9* 5.4* 5.6*  ALBUMIN 3.6 3.1* 2.5* 2.4*   Recent Labs  Lab 05/20/18 1453 05/22/18 0637  LIPASE 26 22   No results for input(s): AMMONIA in the last 168 hours. Coagulation Profile: Recent Labs  Lab 05/22/18 1314  INR 1.2   Cardiac Enzymes: Recent Labs  Lab 05/20/18 1453 05/20/18 1950 05/20/18 2212 05/21/18 0407 05/21/18 0943  TROPONINI 0.06* 0.05* 0.06* 0.06* 0.05*   BNP (last 3 results) No results for input(s): PROBNP in the last 8760 hours. HbA1C: No results for input(s): HGBA1C in the last 72 hours. CBG: Recent Labs  Lab 05/22/18 0618 05/22/18 1208 05/22/18 1702 05/23/18 0610 05/23/18 1200  GLUCAP 102* 101* 116* 110* 121*   Lipid Profile: No results for input(s): CHOL, HDL, LDLCALC, TRIG, CHOLHDL, LDLDIRECT in the last 72 hours. Thyroid Function Tests: No results for input(s): TSH, T4TOTAL, FREET4, T3FREE, THYROIDAB in the last 72 hours. Anemia Panel: No results for input(s): VITAMINB12, FOLATE, FERRITIN, TIBC, IRON, RETICCTPCT in the last 72 hours. Urine analysis:    Component Value Date/Time   COLORURINE YELLOW 05/20/2018 [place] 05/20/2018 1443   LABSPEC 1.010 05/20/2018 1443   PHURINE 5.0 05/20/2018 1443   GLUCOSEU NEGATIVE 05/20/2018 1443   GLUCOSEU NEGATIVE 05/15/2018 1057   HGBUR NEGATIVE 05/20/2018 1443   BILIRUBINUR NEGATIVE 05/20/2018 1443    KETONESUR NEGATIVE 05/20/2018 1443   PROTEINUR NEGATIVE 05/20/2018 1443   UROBILINOGEN 0.2 05/15/2018 1057   NITRITE NEGATIVE 05/20/2018 1443   LEUKOCYTESUR NEGATIVE 05/20/2018 1443   Sepsis Labs: @LABRCNTIP (procalcitonin:4,lacticidven:4)  ) Recent Results (from the past 240 hour(s))  SARS Coronavirus 2 (CEPHEID - Performed in [facility] hospital lab), Hosp Order     Status: None   Collection Time: 05/20/18  5:57 PM  Result Value Ref Range Status   SARS Coronavirus 2 NEGATIVE NEGATIVE Final    Comment: (NOTE) If result is NEGATIVE SARS-CoV-2 target nucleic acids are NOT DETECTED. The SARS-CoV-2 RNA is generally detectable in upper and lower  respiratory specimens during the acute phase of infection. The lowest  concentration of SARS-CoV-2 viral copies this assay can detect is 250  copies / mL. A negative result does not preclude SARS-CoV-2 infection  and should not be used as the sole basis for treatment or other  patient management decisions.  A negative result  may occur with  improper specimen collection / handling, submission of specimen other  than nasopharyngeal swab, presence of viral mutation(s) within the  areas targeted by this assay, and inadequate number of viral copies  (<250 copies / mL). A negative result must be combined with clinical  observations, patient history, and epidemiological information. If result is POSITIVE SARS-CoV-2 target nucleic acids are DETECTED. The SARS-CoV-2 RNA is generally detectable in upper and lower  respiratory specimens dur ing the acute phase of infection.  Positive  results are indicative of active infection with SARS-CoV-2.  Clinical  correlation with patient history and other diagnostic information is  necessary to determine patient infection status.  Positive results do  not rule out bacterial infection or co-infection with other viruses. If result is PRESUMPTIVE POSTIVE SARS-CoV-2 nucleic acids MAY BE PRESENT.   A presumptive  positive result was obtained on the submitted specimen  and confirmed on repeat testing.  While 2019 novel coronavirus  (SARS-CoV-2) nucleic acids may be present in the submitted sample  additional confirmatory testing may be necessary for epidemiological  and / or clinical management purposes  to differentiate between  SARS-CoV-2 and other Sarbecovirus currently known to infect humans.  If clinically indicated additional testing with an alternate test  methodology [phone]) is advised. The SARS-CoV-2 RNA is generally  detectable in upper and lower respiratory sp ecimens during the acute  phase of infection. The expected result is Negative. Fact Sheet for Patients:  StrictlyIdeas.no Fact Sheet for Healthcare Providers: BankingDealers.co.za This test is not yet approved or cleared by the [place] FDA and has been authorized for detection and/or diagnosis of SARS-CoV-2 by FDA under an Emergency Use Authorization (EUA).  This EUA will remain in effect (meaning this test can be used) for the duration of the COVID-19 declaration under Section 564(b)(1) of the Act, 21 U.S.C. section 360bbb-3(b)(1), unless the authorization is terminated or revoked sooner. Performed at [facility], [place] [address], [place], [place] [address]       Studies: Ir Perc Cholecystostomy  Result Date: 05/22/2018 INDICATION: Acute cholecystitis, poor operative candidate. Request made for image guided placement of cholecystostomy tube for infection source control purposes. EXAM: ULTRASOUND AND FLUOROSCOPIC-GUIDED CHOLECYSTOSTOMY TUBE PLACEMENT COMPARISON:  None. MEDICATIONS: The patient is currently admitted to the hospital and on intravenous antibiotics. Antibiotics were administered within an appropriate time frame prior to skin puncture. ANESTHESIA/SEDATION: Moderate (conscious) sedation was employed during this procedure. A total of Versed 1 mg and Fentanyl  75 mcg was administered intravenously. Moderate Sedation Time: 12 minutes. The patient's level of consciousness and vital signs were monitored continuously by radiology nursing throughout the procedure under my direct supervision. CONTRAST:  15 cc Omnipaque 300 - administered into the gallbladder fossa. FLUOROSCOPY TIME:  42 seconds (15 mGy) COMPLICATIONS: None immediate. PROCEDURE: Informed written consent was obtained from the patient after a discussion of the risks, benefits and alternatives to treatment. Questions regarding the procedure were encouraged and answered. A timeout was performed prior to the initiation of the procedure. The right upper abdominal quadrant was prepped and draped in the usual sterile fashion, and a sterile drape was applied covering the operative field. Maximum barrier sterile technique with sterile gowns and gloves were used for the procedure. A timeout was performed prior to the initiation of the procedure. Local anesthesia was provided with 1% lidocaine with epinephrine. Ultrasound scanning of the right upper quadrant demonstrates a markedly dilated gallbladder. Of note, the patient reported pain with ultrasound imaging over  the gallbladder. Utilizing a transhepatic approach, a 22 gauge needle was advanced into the gallbladder under direct ultrasound guidance. An ultrasound image was saved for documentation purposes. Appropriate intraluminal puncture was confirmed with the efflux of bile and advancement of an 0.018 wire into the gallbladder lumen. The needle was exchanged for an [place] set. A small amount of contrast was injected to confirm appropriate intraluminal positioning. Over a Benson wire, a 32.2-French Cook cholecystomy tube was advanced into the gallbladder fossa, coiled and locked. Bile was aspirated and a small amount of contrast was injected as several post procedural spot radiographic images were obtained in various obliquities. The catheter was secured to the skin  with suture, connected to a drainage bag and a dressing was placed. The patient tolerated the procedure well without immediate post procedural complication. IMPRESSION: Successful ultrasound and fluoroscopic guided placement of a 10.2 French cholecystostomy tube. Electronically Signed   By: Sandi Mariscal M.D.   On: 05/22/2018 16:50    Scheduled Meds: . B-complex with vitamin C  1 tablet Oral QODAY  . carbidopa-levodopa  1 tablet Oral TID  . docusate sodium  100 mg Oral BID  . polyethylene glycol  17 g Oral Daily  . pravastatin  20 mg Oral q1800  . sodium chloride flush  5 mL Intracatheter Q8H    Continuous Infusions: . sodium chloride Stopped (05/22/18 1510)  . dextrose 5 % and 0.45% NaCl 50 mL/hr at 05/23/18 0627  . piperacillin-tazobactam (ZOSYN)  IV 3.375 g (05/23/18 1320)     LOS: 3 days     Kayleen Memos, MD [facility] Pager [phone]  If 7PM-7AM, please contact night-coverage [email or web address] Password [facility] 05/23/2018, 1:38 PM

## 2018-05-23 NOTE — Care Management Important Message (Signed)
Important Message  Patient Details  Name: Brandon Rojas MRN: [id number] Date of Birth: [birth year]   Medicare Important Message Given:  Yes    Orbie Pyo 05/23/2018, 2:01 PM

## 2018-05-23 NOTE — Telephone Encounter (Signed)
The meds I give him are in the chart and are being ordered as an in patient.  This is the 3rd call we have received from her re: in patient care.  Please tell her that she needs to address with the hospital and that I cannot help with this (hes in for a gallbladder issue).  She can contact his physician/assistant (who appears to contact her daily from the hospital to update her) and can contact the office of patient experience if unsatisfied.  I am not associated with the inpatient side of the hospital.

## 2018-05-23 NOTE — Progress Notes (Signed)
Subjective: CC: Abdominal Pain A&O x 3 this AM. Patient reports abdominal pain resolved after perc chole drain by IR yesterday. Was started on CLD yesterday. Has only had a few sips of water but reports no abdominal pain, N/V. Reports passing flatus. No BM. Hasn't been mobilizing much.   Objective: Vital signs in last 24 hours: Temp:  [98.5 F (36.9 C)-98.9 F (37.2 C)] 98.9 F (37.2 C) (05/15 0418) Pulse Rate:  [61-94] 61 (05/15 0418) Resp:  [10-19] 18 (05/15 0418) BP: (111-189)/(69-139) 153/80 (05/15 0418) SpO2:  [90 %-100 %] 95 % (05/15 0418) Weight:  [69 kg] 69 kg (05/15 0418) Last BM Date: 05/19/18  Intake/Output from previous day: 05/14 0701 - 05/15 0700 In: 522.3 [I.V.:207.5; IV Piggyback:214.8] Out: 1000 [Urine:1000] Intake/Output this shift: No intake/output data recorded.  PE: Gen: Awake and alert Heart: RRR Lungs: Normal effort Abd: ND, mild tenderness to the epigastric and RUQ tenderness without r/r/g. Perc Chole drain in place. Clear, yellow fluid in bag. +BS Msk: No edema   Lab Results:  Recent Labs    05/22/18 0637 05/23/18 0417  WBC 15.7* 9.3  HGB 12.9* 13.1  HCT 37.1* 38.0*  PLT 167 188   BMET Recent Labs    05/22/18 0637 05/23/18 0417  NA 138 136  K 3.8 3.4*  CL 104 106  CO2 25 22  GLUCOSE 104* 121*  BUN 13 11  CREATININE 1.05 1.05  CALCIUM 8.8* 8.5*   PT/INR Recent Labs    05/22/18 1314  LABPROT 14.7  INR 1.2   CMP     Component Value Date/Time   NA 136 05/23/2018 0417   K 3.4 (L) 05/23/2018 0417   CL 106 05/23/2018 0417   CO2 22 05/23/2018 0417   GLUCOSE 121 (H) 05/23/2018 0417   BUN 11 05/23/2018 0417   CREATININE 1.05 05/23/2018 0417   CALCIUM 8.5 (L) 05/23/2018 0417   PROT 5.6 (L) 05/23/2018 0417   ALBUMIN 2.4 (L) 05/23/2018 0417   AST 24 05/23/2018 0417   ALT <5 05/23/2018 0417   ALKPHOS 70 05/23/2018 0417   BILITOT 1.0 05/23/2018 0417   GFRNONAA >60 05/23/2018 0417   GFRAA >60 05/23/2018 0417   Lipase      Component Value Date/Time   LIPASE 22 05/22/2018 0637       Studies/Results: Ir Perc Cholecystostomy  Result Date: 05/22/2018 INDICATION: Acute cholecystitis, poor operative candidate. Request made for image guided placement of cholecystostomy tube for infection source control purposes. EXAM: ULTRASOUND AND FLUOROSCOPIC-GUIDED CHOLECYSTOSTOMY TUBE PLACEMENT COMPARISON:  None. MEDICATIONS: The patient is currently admitted to the hospital and on intravenous antibiotics. Antibiotics were administered within an appropriate time frame prior to skin puncture. ANESTHESIA/SEDATION: Moderate (conscious) sedation was employed during this procedure. A total of Versed 1 mg and Fentanyl 75 mcg was administered intravenously. Moderate Sedation Time: 12 minutes. The patient's level of consciousness and vital signs were monitored continuously by radiology nursing throughout the procedure under my direct supervision. CONTRAST:  15 cc Omnipaque 300 - administered into the gallbladder fossa. FLUOROSCOPY TIME:  42 seconds (15 mGy) COMPLICATIONS: None immediate. PROCEDURE: Informed written consent was obtained from the patient after a discussion of the risks, benefits and alternatives to treatment. Questions regarding the procedure were encouraged and answered. A timeout was performed prior to the initiation of the procedure. The right upper abdominal quadrant was prepped and draped in the usual sterile fashion, and a sterile drape was applied covering the operative field. Maximum  barrier sterile technique with sterile gowns and gloves were used for the procedure. A timeout was performed prior to the initiation of the procedure. Local anesthesia was provided with 1% lidocaine with epinephrine. Ultrasound scanning of the right upper quadrant demonstrates a markedly dilated gallbladder. Of note, the patient reported pain with ultrasound imaging over the gallbladder. Utilizing a transhepatic approach, a 22 gauge needle was  advanced into the gallbladder under direct ultrasound guidance. An ultrasound image was saved for documentation purposes. Appropriate intraluminal puncture was confirmed with the efflux of bile and advancement of an 0.018 wire into the gallbladder lumen. The needle was exchanged for an [place] set. A small amount of contrast was injected to confirm appropriate intraluminal positioning. Over a Benson wire, a 23.2-French Cook cholecystomy tube was advanced into the gallbladder fossa, coiled and locked. Bile was aspirated and a small amount of contrast was injected as several post procedural spot radiographic images were obtained in various obliquities. The catheter was secured to the skin with suture, connected to a drainage bag and a dressing was placed. The patient tolerated the procedure well without immediate post procedural complication. IMPRESSION: Successful ultrasound and fluoroscopic guided placement of a 10.2 French cholecystostomy tube. Electronically Signed   By: Sandi Mariscal M.D.   On: 05/22/2018 16:50   Dg Chest Port 1 View  Result Date: 05/22/2018 CLINICAL DATA:  73 year old male with altered mental status. EXAM: PORTABLE CHEST 1 VIEW COMPARISON:  Chest radiographs 05/20/2018 and earlier. Chest CT 01/27/2014. FINDINGS: Portable AP semi upright view at 0830 hours. Mildly lower lung volumes. Stable cardiac size and mediastinal contours. Cardiomegaly and chronic hiatal hernia. Prior CABG. No pneumothorax. Stable pulmonary vascularity without overt edema. Stable streaky mostly left lung base opacity. No definite pleural effusion. Visible bowel-gas pattern within normal limits. Stable visualized osseous structures. Calcified aortic atherosclerosis. IMPRESSION: 1. Lower lung volumes with lung base opacity, favor atelectasis. 2. No new cardiopulmonary abnormality. 3.  Aortic Atherosclerosis (ICD10-I70.0). Electronically Signed   By: Genevie Ann M.D.   On: 05/22/2018 08:59     Anti-infectives: Anti-infectives (From admission, onward)   Start     Dose/Rate Route Frequency Ordered Stop   05/21/18 0400  piperacillin-tazobactam (ZOSYN) IVPB 3.375 g     3.375 g 12.5 mL/hr over 240 Minutes Intravenous Every 8 hours 05/20/18 2017     05/20/18 2000  piperacillin-tazobactam (ZOSYN) IVPB 3.375 g     3.375 g 100 mL/hr over 30 Minutes Intravenous  Once 05/20/18 1947 05/20/18 2346       Assessment/Plan CAD s/p CABG 2006 - on Plavix and ASA. Hold Plavix - Received Cardiac clearance on 5/13 Elevated Tn HTN HLD Parkinsons   Cholecystitis with cholelithiasis - LFTs, T bili wnl. WBC normalized.  - s/p Perc Chole Drain placement on 5/14 - Continue IV abx  - Mobilize as tolerated. IS/Pulm Toilet.  FEN -CLD (AAT), IVF, Colace & Miralax, K 3.4 (replaced) VTE -SCDs, okay for Lovenox or Heparin from surgical standpoint. ID -Zosyn 5/12 >> WBC 16.1->16.8-> 15.7->9.3 POA - Wife, Jayro Mcmath [phone])   LOS: 3 days    Jillyn Ledger , [facility] 05/23/2018, 7:37 AM Pager: [phone]

## 2018-05-24 LAB — GLUCOSE, CAPILLARY
Glucose-Capillary: 110 mg/dL — ABNORMAL HIGH (ref 70–99)
Glucose-Capillary: 92 mg/dL (ref 70–99)
Glucose-Capillary: 123 mg/dL — ABNORMAL HIGH (ref 70–99)

## 2018-05-24 MED ORDER — CIPROFLOXACIN HCL 500 MG PO TABS: 500.0000 mg | ORAL_TABLET | Freq: Two times a day (BID) | ORAL | Status: DC

## 2018-05-24 MED ORDER — AMOXICILLIN-POT CLAVULANATE 875-125 MG PO TABS: 1.0000 | ORAL_TABLET | Freq: Two times a day (BID) | ORAL | 0 refills | Status: AC

## 2018-05-24 MED ORDER — ASPIRIN 81 MG PO CHEW
81.0000 mg | CHEWABLE_TABLET | Freq: Every day | ORAL | Status: DC
  Administered 2018-05-24: 81 mg via ORAL
  Filled 2018-05-24: qty 1

## 2018-05-24 MED ORDER — AMOXICILLIN-POT CLAVULANATE 875-125 MG PO TABS
1.0000 | ORAL_TABLET | Freq: Two times a day (BID) | ORAL | Status: DC
  Administered 2018-05-24: 1 via ORAL
  Filled 2018-05-24: qty 1

## 2018-05-24 MED ORDER — CLOPIDOGREL BISULFATE 75 MG PO TABS
75.0000 mg | ORAL_TABLET | Freq: Every day | ORAL | Status: DC
  Administered 2018-05-24: 10:00:00 75 mg via ORAL
  Filled 2018-05-24: qty 1

## 2018-05-24 NOTE — Progress Notes (Signed)
[name] to be D/C'd per MD order. Discussed with the patient and all questions fully answered. ? VSS, Skin clean, dry and intact without evidence of skin break down, no evidence of skin tears noted. ? IV catheter discontinued intact. Site without signs and symptoms of complications. Dressing and pressure applied. ? An After Visit Summary was printed and given to the patient. Patient informed where to pickup prescriptions. ? D/c education completed with patient/family including follow up instructions, medication list, d/c activities limitations if indicated, with other d/c instructions as indicated by MD - patient able to verbalize understanding, all questions fully answered.  ? Patient instructed to return to ED, call 911, or call MD for any changes in condition.  ? Patient to be escorted via [place], and D/C home via private auto.

## 2018-05-24 NOTE — Discharge Summary (Addendum)
Discharge Summary  Brandon Rojas WIO:[id number] DOB: Nov 28, 1945  PCP: Biagio Borg, MD  Admit date: 05/20/2018 Discharge date: 05/24/2018  Time spent: 35 minutes  Recommendations for Outpatient Follow-up:  1. Follow-up with your primary care provider 2. Follow-up with cardiology 3. Follow-up with interventional radiology 4. Follow-up with general surgery 5. Follow-up with your neurologist 6. Take your medications as prescribed 7. Continue wound care at home 8. Physical therapy 9. Fall precautions  Discharge Diagnoses:  Active Hospital Problems   Diagnosis Date Noted   Acute cholecystitis 05/20/2018   Elevated troponin 05/20/2018   Epigastric pain 05/20/2018   CAD (coronary artery disease) of artery bypass graft 05/20/2018   Parkinson's disease ([facility]) 08/19/2014    Resolved Hospital Problems  No resolved problems to display.    Discharge Condition: Stable  Diet recommendation: Resume previous diet  Vitals:   05/24/18 0508 05/24/18 0755  BP: 137/77 (!) 145/83  Pulse: (!) 44 (!) 56  Resp:  18  Temp: 98 F (36.7 C) 98.7 F (37.1 C)  SpO2: 95% 96%    History of present illness:  Brandon R Smithis a 73 y.o.malewithstory of CAD status post CABG, hypertension, Parkinson's disease presents to the ER with complaints of epigastric pain ongoing for last 2 days. Patient initially thought it was due to his hiatal hernia. Denies any associated vomiting or diarrhea denies any fever chills. Denies any chest pain.  ED Course:In the ER on exam patient has right upper quadrant tenderness. CT abdomen followed by ultrasound shows features concerning for acute cholecystitis. WBC count elevated with normal LFTs. On-call general surgeon was consulted. Started on empiric antibiotics. Given history of CAD troponin was done which shows mild elevation. Cardiology was consulted. Troponin trended down.  Denied any chest pain.  ACS ruled out, no further work-up.  Cleared by  cardiology.  On 05/22/2018 had a Perc Chole drain placed by interventional radiology and tolerated well.  Drain will remain for 6 to 8 weeks unless to the OR first as recommended by interventional radiology.  05/24/18: Patient was seen and examined at his bedside.  He has no new complaints.  He is alert and oriented x3.  On the day of discharge, the patient was hemodynamically stable.  He will need to follow-up with his primary care provider, cardiology, interventional radiology, general surgery posthospitalization.  Patient understands and agrees to plan.    Hospital Course:  Principal Problem:   Acute cholecystitis Active Problems:   Parkinson's disease (HCC)   Elevated troponin   Epigastric pain   CAD (coronary artery disease) of artery bypass graft  Acute cholecystitis status post drainage catheter placement on 05/22/2018 By interventional radiology Completed 5 days of Zosyn Follow-up with interventional radiology Follow-up with general surgery Home with home health services for wound care and physical therapy Started Augmentin on 05/24/2018 x 10 days Perc Chole drain will stay for 6 to 8 weeks per interventional radiology unless he goes to the OR first.  Prolonged QTC, resolved Last QTC greater than 600 on 05/22/2018 Repeat EKG ordered and showed resolution of prolonged QTC 445 on /05/24/18 Asymptomatic Avoid QTC prolonging agents Hold PPI due to prolonged QTC Follow-up with your cardiologist outpatient  GERD Continue to hold PPI due to prolonged QTC Defer to cardiology to restart PPI due to recent prolonged QTC  Sinus bradycardia Asymptomatic Latest heart rate 56 Twelve-lead EKG done on 05/24/2018 shows sinus bradycardia rate of 56 with no specific ST-T changes. Avoid beta-blockers or calcium channel blockers at this  time Follow-up with your cardiologist  Resolved acute metabolic encephalopathy Possibly iatrogenic secondary to opiates use  Resolved uncontrolled  hypertension Vital signs are back to normal Possibly contributed by pain which is now resolving  Elevated troponin, ACS ruled out Peaked at 0.06 and trended down to 0.05 Denies chest pain Cardiology clearance obtained Outpatient follow-up with cardiology  Hyperlipidemia Continue lovastatin  Parkinson's disease Continue carbidopa- levodopa Follow-up with your neurologist   Code Status:Full code.  Consults called:Cardiology, general surgery, interventional radiology     Procedures:  Perc Chole drain placement done on 05/22/2018 by IR   Discharge Exam: BP (!) 145/83 (BP Location: Left Arm)    Pulse (!) 56    Temp 98.7 F (37.1 C) (Oral)    Resp 18    Ht 5\' 4"  (1.626 m)    Wt 65.8 kg    SpO2 96%    BMI 24.90 kg/m   General: 73 y.o. year-old male well developed well nourished in no acute distress.  Alert and oriented x3.  Cardiovascular: Regular rate and rhythm with no rubs or gallops.  No thyromegaly or JVD noted.    Respiratory: Clear to auscultation with no wheezes or rales. Good inspiratory effort.  Abdomen: Soft nontender nondistended with normal bowel sounds x4 quadrants.  Perc Chole drain in place.  Musculoskeletal: No lower extremity edema. 2/4 pulses in all 4 extremities.  Psychiatry: Mood is appropriate for condition and setting  Discharge Instructions You were cared for by a hospitalist during your hospital stay. If you have any questions about your discharge medications or the care you received while you were in the hospital after you are discharged, you can call the unit and asked to speak with the hospitalist on call if the hospitalist that took care of you is not available. Once you are discharged, your primary care physician will handle any further medical issues. Please note that NO REFILLS for any discharge medications will be authorized once you are discharged, as it is imperative that you return to your primary care physician (or establish a  relationship with a primary care physician if you do not have one) for your aftercare needs so that they can reassess your need for medications and monitor your lab values.   Allergies as of 05/24/2018      Reactions   Atorvastatin Other (See Comments)   Muscle cramps   Simvastatin Other (See Comments)   Muscle cramps      Medication List    STOP taking these medications   esomeprazole 40 MG capsule Commonly known as:  Petal these medications   amoxicillin-clavulanate 875-125 MG tablet Commonly known as:  AUGMENTIN Take 1 tablet by mouth every 12 (twelve) hours for 10 days.   aspirin 81 MG tablet Take 81 mg by mouth daily.   carbidopa-levodopa 25-100 MG tablet Commonly known as:  SINEMET IR Take 1 tablet by mouth 3 (three) times daily.   clopidogrel 75 MG tablet Commonly known as:  PLAVIX TAKE 1 TABLET EVERY DAY WITH BREAKFAST What changed:  See the new instructions.   Fish Oil 1000 MG Caps Take 1,000-2,000 mg by mouth 2 (two) times a week.   lovastatin 20 MG tablet Commonly known as:  MEVACOR TAKE 1 TABLET AT BEDTIME   Nitrostat 0.4 MG SL tablet Generic drug:  nitroGLYCERIN PLACE 1 TABLET (0.4 MG TOTAL) UNDER THE TONGUE EVERY 5 (FIVE) MINUTES AS NEEDED. What changed:  See the new instructions.   tetrahydrozoline-zinc 0.05-0.25 %  ophthalmic solution Commonly known as:  VISINE-AC Place 2 drops into both eyes 3 (three) times daily as needed (for burning or eye redness).   tiZANidine 2 MG tablet Commonly known as:  ZANAFLEX Take 1 tablet (2 mg total) by mouth every 6 (six) hours as needed for muscle spasms.   traMADol 50 MG tablet Commonly known as:  ULTRAM Take 1 tablet (50 mg total) by mouth every 6 (six) hours as needed. for pain What changed:    reasons to take this  additional instructions   VITAMIN B COMPLEX PO Take 1 tablet by mouth every other day.   vitamin E 400 UNIT capsule Take 400 Units by mouth 2 (two) times a week.       Allergies  Allergen Reactions   Atorvastatin Other (See Comments)    Muscle cramps   Simvastatin Other (See Comments)    Muscle cramps   Follow-up Information    Sandi Mariscal, MD Follow up.   Specialties:  Interventional Radiology, Radiology Contact information: [address] [place] [address] [phone]        Biagio Borg, MD Follow up.   Specialties:  Internal Medicine, Radiology Contact information: [address] [place] [address] [phone]        Minus Breeding, MD .   Specialty:  Cardiology Contact information: [address] [place] [address] [phone]        Ileana Roup, MD. Go on 06/10/2018.   Specialty:  General Surgery Why:  Your appointment is 6/2 at 11:15am. Please arrive 30 minutes prior to your appointment to check in and fill out paperwork. Bring photo ID and insurance information. Contact information: [place] [address] [phone]        [facility] Follow up.   Specialty:  [place] Why:  A home health care nurse will go to your home Contact information: [place] [address] [phone]            The results of significant diagnostics from this hospitalization (including imaging, microbiology, ancillary and laboratory) are listed below for reference.    Significant Diagnostic Studies: Dg Chest 2 View  Result Date: 05/20/2018 CLINICAL DATA:  Epigastric pain.  History of hiatal hernia. EXAM: CHEST - 2 VIEW COMPARISON:  CT chest 01/27/2014.  PA and lateral chest 01/15/2014. FINDINGS: The patient is status post CABG. There is cardiomegaly and atherosclerosis. Lungs are clear. Moderate hiatal hernia is identified and appears increased in size since the prior study. No acute bony abnormality. IMPRESSION: Moderate hiatal hernia appears somewhat increased compared to the prior exams. Cardiomegaly without edema.  Atherosclerosis. Electronically Signed   By: Inge Rise M.D.   On: 05/20/2018 15:36   Ct Abdomen Pelvis W Contrast  Result Date: 05/20/2018 CLINICAL DATA:  Initial evaluation for acute upper abdominal pain. EXAM: CT ABDOMEN AND PELVIS WITH CONTRAST TECHNIQUE: Multidetector CT imaging of the abdomen and pelvis was performed using the standard protocol following bolus administration of intravenous contrast. CONTRAST:  143mL OMNIPAQUE IOHEXOL 300 MG/ML  SOLN COMPARISON:  None available. FINDINGS: Lower chest: Scattered atelectatic changes seen dependently within the visualized lung bases, left greater than right. Visualized lungs are otherwise clear. Cardiomegaly with 3 vessel coronary artery calcifications partially visualized. Hepatobiliary: Liver demonstrates a normal contrast enhanced appearance. Focal fat deposition noted adjacent to the falciform ligament. Subcentimeter radiopaque density noted within the gallbladder lumen, compatible with  cholelithiasis. Gallbladder itself is somewhat distended with hazy pericholecystic fat stranding and mucosal enhancement, raising the possibility for possible acute cholecystitis. No significant common bile duct dilatation. Mild intrahepatic biliary dilatation. Pancreas: Pancreas within normal limits. No abnormal pancreatic ductal dilatation. Spleen: Spleen within normal limits. Adrenals/Urinary Tract: Adrenal glands are normal. Left kidney mildly atrophic as compared to the right. Symmetric enhancement seen within the kidneys bilaterally. No nephrolithiasis, hydronephrosis or focal enhancing renal mass. No hydroureter. Bladder within normal limits. Stomach/Bowel: Large hiatal hernia. Stomach otherwise unremarkable. No evidence for bowel obstruction. Normal appendix. Colonic diverticulosis without evidence for acute diverticulitis. No acute inflammatory changes seen about the bowels. Moderate to large volume retained stool within the distal colon, suggesting  constipation. Vascular/Lymphatic: Advanced atherosclerotic change throughout the intra-abdominal aorta and its branch vessels. No aneurysm. Mesenteric vessels grossly patent proximally. No adenopathy. Reproductive: Prostate mildly enlarged measuring 5 cm in transverse diameter. Other: No free air or fluid. Musculoskeletal: No acute osseous abnormality. No discrete lytic or blastic osseous lesions. Moderate thoracolumbar levoscoliosis, apex at L2. IMPRESSION: 1. Cholelithiasis with hazy pericholecystic fat stranding and mucosal enhancement raising the possibility for acute cholecystitis. Correlation with laboratory values recommended. Additionally, further evaluation with dedicated right upper quadrant ultrasound could be performed as clinically warranted. 2. Large hiatal hernia. 3. Moderate to large volume retained stool within the distal colon, suggesting constipation. 4. Colonic diverticulosis without evidence for acute diverticulitis. 5. Advanced atherosclerosis with 3 vessel coronary artery calcifications. Electronically Signed   By: Jeannine Boga M.D.   On: 05/20/2018 17:22   Ir Perc Cholecystostomy  Result Date: 05/22/2018 INDICATION: Acute cholecystitis, poor operative candidate. Request made for image guided placement of cholecystostomy tube for infection source control purposes. EXAM: ULTRASOUND AND FLUOROSCOPIC-GUIDED CHOLECYSTOSTOMY TUBE PLACEMENT COMPARISON:  None. MEDICATIONS: The patient is currently admitted to the hospital and on intravenous antibiotics. Antibiotics were administered within an appropriate time frame prior to skin puncture. ANESTHESIA/SEDATION: Moderate (conscious) sedation was employed during this procedure. A total of Versed 1 mg and Fentanyl 75 mcg was administered intravenously. Moderate Sedation Time: 12 minutes. The patient's level of consciousness and vital signs were monitored continuously by radiology nursing throughout the procedure under my direct supervision.  CONTRAST:  15 cc Omnipaque 300 - administered into the gallbladder fossa. FLUOROSCOPY TIME:  42 seconds (15 mGy) COMPLICATIONS: None immediate. PROCEDURE: Informed written consent was obtained from the patient after a discussion of the risks, benefits and alternatives to treatment. Questions regarding the procedure were encouraged and answered. A timeout was performed prior to the initiation of the procedure. The right upper abdominal quadrant was prepped and draped in the usual sterile fashion, and a sterile drape was applied covering the operative field. Maximum barrier sterile technique with sterile gowns and gloves were used for the procedure. A timeout was performed prior to the initiation of the procedure. Local anesthesia was provided with 1% lidocaine with epinephrine. Ultrasound scanning of the right upper quadrant demonstrates a markedly dilated gallbladder. Of note, the patient reported pain with ultrasound imaging over the gallbladder. Utilizing a transhepatic approach, a 22 gauge needle was advanced into the gallbladder under direct ultrasound guidance. An ultrasound image was saved for documentation purposes. Appropriate intraluminal puncture was confirmed with the efflux of bile and advancement of an 0.018 wire into the gallbladder lumen. The needle was exchanged for an [place] set. A small amount of contrast was injected to confirm appropriate intraluminal positioning. Over a Benson wire, a 38.2-French Cook cholecystomy tube was advanced into the gallbladder fossa,  coiled and locked. Bile was aspirated and a small amount of contrast was injected as several post procedural spot radiographic images were obtained in various obliquities. The catheter was secured to the skin with suture, connected to a drainage bag and a dressing was placed. The patient tolerated the procedure well without immediate post procedural complication. IMPRESSION: Successful ultrasound and fluoroscopic guided placement of a  10.2 French cholecystostomy tube. Electronically Signed   By: Sandi Mariscal M.D.   On: 05/22/2018 16:50   Dg Chest Port 1 View  Result Date: 05/22/2018 CLINICAL DATA:  73 year old male with altered mental status. EXAM: PORTABLE CHEST 1 VIEW COMPARISON:  Chest radiographs 05/20/2018 and earlier. Chest CT 01/27/2014. FINDINGS: Portable AP semi upright view at 0830 hours. Mildly lower lung volumes. Stable cardiac size and mediastinal contours. Cardiomegaly and chronic hiatal hernia. Prior CABG. No pneumothorax. Stable pulmonary vascularity without overt edema. Stable streaky mostly left lung base opacity. No definite pleural effusion. Visible bowel-gas pattern within normal limits. Stable visualized osseous structures. Calcified aortic atherosclerosis. IMPRESSION: 1. Lower lung volumes with lung base opacity, favor atelectasis. 2. No new cardiopulmonary abnormality. 3.  Aortic Atherosclerosis (ICD10-I70.0). Electronically Signed   By: Genevie Ann M.D.   On: 05/22/2018 08:59   US Abdomen Limited Ruq  Result Date: 05/20/2018 CLINICAL DATA:  Upper abdominal/epigastric pain EXAM: ULTRASOUND ABDOMEN LIMITED RIGHT UPPER QUADRANT COMPARISON:  CT abdomen and pelvis May 20, 2018 FINDINGS: Gallbladder: Within the gallbladder, there are echogenic foci which move and shadow consistent with cholelithiasis. Largest gallstone measures 1.1 cm in length. The gallbladder wall is thickened and subtly edematous. There is no appreciable pericholecystic fluid. No sonographic Murphy sign noted by sonographer. Common bile duct: Diameter: 6 mm. No intrahepatic or extrahepatic biliary duct dilatation. Liver: No focal lesion identified. Within normal limits in parenchymal echogenicity. Portal vein is patent on color Doppler imaging with normal direction of blood flow towards the liver. IMPRESSION: Cholelithiasis. Gallbladder wall mildly thickened and subtly edematous. Suspect a degree of acute cholecystitis given this appearance. Study  otherwise unremarkable. Electronically Signed   By: Lowella Grip III M.D.   On: 05/20/2018 19:15    Microbiology: Recent Results (from the past 240 hour(s))  SARS Coronavirus 2 (CEPHEID - Performed in [facility] hospital lab), Hosp Order     Status: None   Collection Time: 05/20/18  5:57 PM  Result Value Ref Range Status   SARS Coronavirus 2 NEGATIVE NEGATIVE Final    Comment: (NOTE) If result is NEGATIVE SARS-CoV-2 target nucleic acids are NOT DETECTED. The SARS-CoV-2 RNA is generally detectable in upper and lower  respiratory specimens during the acute phase of infection. The lowest  concentration of SARS-CoV-2 viral copies this assay can detect is 250  copies / mL. A negative result does not preclude SARS-CoV-2 infection  and should not be used as the sole basis for treatment or other  patient management decisions.  A negative result may occur with  improper specimen collection / handling, submission of specimen other  than nasopharyngeal swab, presence of viral mutation(s) within the  areas targeted by this assay, and inadequate number of viral copies  (<250 copies / mL). A negative result must be combined with clinical  observations, patient history, and epidemiological information. If result is POSITIVE SARS-CoV-2 target nucleic acids are DETECTED. The SARS-CoV-2 RNA is generally detectable in upper and lower  respiratory specimens dur ing the acute phase of infection.  Positive  results are indicative of active infection with SARS-CoV-2.  Clinical  correlation with patient history and other diagnostic information is  necessary to determine patient infection status.  Positive results do  not rule out bacterial infection or co-infection with other viruses. If result is PRESUMPTIVE POSTIVE SARS-CoV-2 nucleic acids MAY BE PRESENT.   A presumptive positive result was obtained on the submitted specimen  and confirmed on repeat testing.  While 2019 novel coronavirus    (SARS-CoV-2) nucleic acids may be present in the submitted sample  additional confirmatory testing may be necessary for epidemiological  and / or clinical management purposes  to differentiate between  SARS-CoV-2 and other Sarbecovirus currently known to infect humans.  If clinically indicated additional testing with an alternate test  methodology [phone]) is advised. The SARS-CoV-2 RNA is generally  detectable in upper and lower respiratory sp ecimens during the acute  phase of infection. The expected result is Negative. Fact Sheet for Patients:  StrictlyIdeas.no Fact Sheet for Healthcare Providers: BankingDealers.co.za This test is not yet approved or cleared by the [place] FDA and has been authorized for detection and/or diagnosis of SARS-CoV-2 by FDA under an Emergency Use Authorization (EUA).  This EUA will remain in effect (meaning this test can be used) for the duration of the COVID-19 declaration under Section 564(b)(1) of the Act, 21 U.S.C. section 360bbb-3(b)(1), unless the authorization is terminated or revoked sooner. Performed at [facility], [place] [address] [place], [place] [address]      Labs: Basic Metabolic Panel: Recent Labs  Lab 05/20/18 1453 05/21/18 0407 05/22/18 0637 05/23/18 0417  NA 138 138 138 136  K 4.0 3.8 3.8 3.4*  CL 103 103 104 106  CO2 23 25 25 22   GLUCOSE 121* 124* 104* 121*  BUN 11 13 13 11   CREATININE 1.05 1.08 1.05 1.05  CALCIUM 9.9 9.4 8.8* 8.5*   Liver Function Tests: Recent Labs  Lab 05/20/18 1453 05/21/18 0407 05/22/18 0637 05/23/18 0417  AST 23 20 21 24   ALT 8 26 21  <5  ALKPHOS 60 58 64 70  BILITOT 0.9 0.8 1.0 1.0  PROT 6.5 5.9* 5.4* 5.6*  ALBUMIN 3.6 3.1* 2.5* 2.4*   Recent Labs  Lab 05/20/18 1453 05/22/18 0637  LIPASE 26 22   No results for input(s): AMMONIA in the last 168 hours. CBC: Recent Labs  Lab 05/20/18 1453 05/21/18 0407 05/22/18 0637  05/23/18 0417  WBC 16.1* 16.8* 15.7* 9.3  NEUTROABS 14.3*  --  13.6*  --   HGB 14.5 14.3 12.9* 13.1  HCT 42.7 42.2 37.1* 38.0*  MCV 90.5 90.4 89.8 88.8  PLT 213 209 167 188   Cardiac Enzymes: Recent Labs  Lab 05/20/18 1453 05/20/18 1950 05/20/18 2212 05/21/18 0407 05/21/18 0943  TROPONINI 0.06* 0.05* 0.06* 0.06* 0.05*   BNP: BNP (last 3 results) No results for input(s): BNP in the last 8760 hours.  ProBNP (last 3 results) No results for input(s): PROBNP in the last 8760 hours.  CBG: Recent Labs  Lab 05/23/18 1200 05/23/18 1913 05/24/18 0633 05/24/18 0757 05/24/18 1132  GLUCAP 121* 190* 110* 92 123*       Signed:  Kayleen Memos, MD [facility] 05/24/2018, 12:33 PM

## 2018-05-24 NOTE — Progress Notes (Signed)
Physical Therapy Treatment Patient Details Name: Brandon Rojas MRN: [id number] DOB: 1946-01-06 Today's Date: 05/24/2018    History of Present Illness 73 y.o. male with story of CAD status post CABG, hypertension, Parkinson's disease presents to the ER with complaints of epigastric pain ongoing for last 2 days. Dx with acute cholecystitis, poor surgical candidate so drainage catheter placed into gallbladder.    PT Comments    Today's session focused on mobility progression with good progress made. Pt significantly increased his walking distance with less assistance/cues needed. Also completed stair training today. Patient safe to D/C from a mobility standpoint based on progression towards goals set on PT eval.     Follow Up Recommendations  Home health PT;Supervision/Assistance - 24 hour     Equipment Recommendations  None recommended by PT    Precautions / Restrictions Precautions Precautions: Fall Restrictions Weight Bearing Restrictions: No    Mobility  Bed Mobility               General bed mobility comments: pt in chair before and after session.  Transfers Overall transfer level: Needs assistance Equipment used: Rolling walker (2 wheeled) Transfers: Sit to/from Stand Sit to Stand: Min guard         General transfer comment: no physical assistance needed. reminder cues for hand placement for safety.   Ambulation/Gait Ambulation/Gait assistance: Min guard Gait Distance (Feet): 200 Feet Assistive device: Rolling walker (2 wheeled) Gait Pattern/deviations: Step-through pattern;Decreased stride length;Trunk flexed Gait velocity: decreased   General Gait Details: cues to stay within walker. improved bil step length today. no balance issues noted with gait.    Stairs Stairs: Yes Stairs assistance: Min assist Stair Management: One rail Right;Alternating pattern;Step to pattern Number of Stairs: 2 General stair comments: rail on right/HHA on left due to unable  to reach both rails (pt has 2 rails at home). other than providing assist due to no rail, no other assistance needed. reciprocal pattern up and step to pattern to descend. discussed use of step to pattern for ascending as an energy conservation technique. pt verbalized understanding.       Cognition Arousal/Alertness: Awake/alert Behavior During Therapy: WFL for tasks assessed/performed Overall Cognitive Status: Within Functional Limits for tasks assessed                 Pertinent Vitals/Pain Pain Assessment: No/denies pain     PT Goals (current goals can now be found in the care plan section) Acute Rehab PT Goals Patient Stated Goal: go home to be with wife PT Goal Formulation: With patient Time For Goal Achievement: 06/06/18 Potential to Achieve Goals: Good Progress towards PT goals: Progressing toward goals    Frequency    Min 3X/week      PT Plan Current plan remains appropriate    AM-PAC PT "6 Clicks" Mobility   Outcome Measure  Help needed turning from your back to your side while in a flat bed without using bedrails?: A Little Help needed moving from lying on your back to sitting on the side of a flat bed without using bedrails?: A Lot Help needed moving to and from a bed to a chair (including a wheelchair)?: A Little Help needed standing up from a chair using your arms (e.g., wheelchair or bedside chair)?: A Little Help needed to walk in hospital room?: A Little Help needed climbing 3-5 steps with a railing? : A Little 6 Click Score: 17    End of Session Equipment Utilized During Treatment: Gait belt  Activity Tolerance: Patient tolerated treatment well Patient left: in chair;with call bell/phone within reach;with chair alarm set Nurse Communication: Mobility status PT Visit Diagnosis: Unsteadiness on feet (R26.81);Muscle weakness (generalized) (M62.81);Difficulty in walking, not elsewhere classified (R26.2)     Time: 0034-9179 PT Time Calculation  (min) (ACUTE ONLY): 23 min  Charges:  $Gait Training: 23-37 mins                    Willow Ora, [place], CLT [facility] [employer] Office- [phone] 05/24/18, 2:07 PM   Willow Ora 05/24/2018, 2:05 PM

## 2018-05-24 NOTE — Discharge Instructions (Signed)
Cholecystitis ° °Cholecystitis is irritation and swelling (inflammation) of the gallbladder. The gallbladder is an organ that is shaped like a pear. It is under the liver on the right side of the body. This organ stores bile. Bile helps the body break down (digest) the fats in food. °This condition can occur all of a sudden. It needs to be treated. °What are the causes? °This condition may be caused by stones or lumps that form in the gallbladder (gallstones). Gallstones can block the tube (duct) that carries bile out of your gallbladder. °Other causes are: °· Damage to the gallbladder due to less blood flow. °· Germs in the bile ducts. °· Scars or kinks in the bile ducts. °· Abnormal growths (tumors) in the liver, pancreas, or gallbladder. °What increases the risk? °You are more likely to develop this condition if: °· You have sickle cell disease. °· You take birth control pills.  °· You use estrogen. °· You have alcoholic liver disease. °· You have liver cirrhosis. °· You are being fed through a vein. °· You are very ill. °· You do not eat or drink for a long time. This is also called "fasting." °· You are overweight (obese). °· You lose weight too fast. °· You are pregnant. °· You have high levels of fat in the blood (triglycerides). °· You have irritation and swelling of the pancreas (pancreatitis). °What are the signs or symptoms? °Symptoms of this condition include: °· Pain in the belly (abdomen). Pain is often in the upper right area of the belly. °· Tenderness or bloating in the belly. °· Feeling sick to your stomach (nauseous). °· Throwing up (vomiting). °· Fever. °· Chills. °How is this diagnosed? °This condition may be diagnosed with a medical history and exam. You may also have other tests, such as: °· Imaging tests. This may include: °? Ultrasound. °? CT scan of the belly. °? Nuclear scan. This is also called a HIDA scan. This scan lets your doctor see the bile as it moves in your body. °? MRI. °· Blood  tests. These are done to check: °? Your blood count. The white blood cell count may be higher than normal. °? How well your liver works. °How is this treated? °This condition may be treated with: °· Surgery to take out your gallbladder. °· Antibiotic medicines to treat illnesses caused by germs. °· Going without food for some time. °· Giving fluids through an IV tube. °· Medicines to treat pain or throwing up. °Follow these instructions at home: °· If you had surgery, follow instructions from your doctor about how to care for yourself after you go home. °Medicines ° °· Take over-the-counter and prescription medicines only as told by your doctor. °· If you were prescribed an antibiotic medicine, take it as told by your doctor. Do not stop taking it even if you start to feel better. °General instructions °· Follow instructions from your doctor about what to eat or drink. Do not eat or drink anything that makes you sick again. °· Do not lift anything that is heavier than 10 lb (4.5 kg) until your doctor says that it is safe. °· Do not use any products that contain nicotine or tobacco, such as cigarettes and e-cigarettes. If you need help quitting, ask your doctor. °· Keep all follow-up visits as told by your doctor. This is important. °Contact a doctor if: °· You have pain and your medicine does not help. °· You have a fever. °Get help right away if: °·   Your pain moves to: ? Another part of your belly. ? Your back.  Your symptoms do not go away.  You have new symptoms. Summary  Cholecystitis is swelling and irritation of the gallbladder.  This condition may be caused by stones or lumps that form in the gallbladder (gallstones).  Common symptoms are pain in the belly. You may feel sick to your stomach and start throwing up. You may also have a fever and chills.  This condition may be treated with surgery to take out the gallbladder. It may also be treated with medicines, fasting, and fluids through an IV  tube.  Follow what you are told about eating and drinking. Do not eat things that make you sick again. This information is not intended to replace advice given to you by your health care provider. Make sure you discuss any questions you have with your health care provider. Document Released: 12/14/2010 Document Revised: 05/03/2017 Document Reviewed: 05/03/2017 Elsevier Interactive Patient Education  2019 [place].   Abdominal Pain, Adult  Many things can cause belly (abdominal) pain. Most times, belly pain is not dangerous. Many cases of belly pain can be watched and treated at home. Sometimes belly pain is serious, though. Your doctor will try to find the cause of your belly pain. Follow these instructions at home:  Take over-the-counter and prescription medicines only as told by your doctor. Do not take medicines that help you poop (laxatives) unless told to by your doctor.  Drink enough fluid to keep your pee (urine) clear or pale yellow.  Watch your belly pain for any changes.  Keep all follow-up visits as told by your doctor. This is important. Contact a doctor if:  Your belly pain changes or gets worse.  You are not hungry, or you lose weight without trying.  You are having trouble pooping (constipated) or have watery poop (diarrhea) for more than 2-3 days.  You have pain when you pee or poop.  Your belly pain wakes you up at night.  Your pain gets worse with meals, after eating, or with certain foods.  You are throwing up and cannot keep anything down.  You have a fever. Get help right away if:  Your pain does not go away as soon as your doctor says it should.  You cannot stop throwing up.  Your pain is only in areas of your belly, such as the right side or the left lower part of the belly.  You have bloody or black poop, or poop that looks like tar.  You have very bad pain, cramping, or bloating in your belly.  You have signs of not having enough fluid or  water in your body (dehydration), such as: ? Dark pee, very little pee, or no pee. ? Cracked lips. ? Dry mouth. ? Sunken eyes. ? Sleepiness. ? Weakness. This information is not intended to replace advice given to you by your health care provider. Make sure you discuss any questions you have with your health care provider. Document Released: 06/13/2007 Document Revised: 07/15/2015 Document Reviewed: 06/08/2015 Elsevier Interactive Patient Education  2019 [place] After This sheet gives you information about how to care for yourself after your procedure. Your health care provider may also give you more specific instructions. If you have problems or questions, contact your health care provider. What can I expect after the procedure? After your procedure, it is common to have soreness near the incision site of your drainage tube (catheter). Follow  these instructions at home: Incision care   Follow instructions from your health care provider about how to take care of your incision site where the catheter was inserted. Make sure you: ? Wash your hands with soap and water before and after you change your bandage (dressing). If soap and water are not available, use hand sanitizer. ? Change your dressing as told by your health care provider.  Check the incision site every day for signs of infection. Check for: ? Redness, swelling, or pain. ? Fluid or blood. ? Warmth. ? Pus or a bad smell.  Do not take baths, swim, or use a hot tub until your health care provider approves. Ask your health care provider if you may take showers. You may only be allowed to take sponge baths. General instructions  Follow instructions from your health care provider about how to care for your catheter and collection bag at home.  Your health care provider will show you: ? How to record the amount of drainage from the catheter. ? How to flush the catheter. ? How to care for the  catheter incision site.  Follow instructions from your health care provider about eating or drinking restrictions.  Take over-the-counter and prescription medicines only as told by your health care provider.  Keep all follow-up visits as told by your health care provider. This is important. Contact a health care provider if:  You have redness, swelling, or pain around the catheter incision site.  You have nausea or vomiting. Get help right away if:  Your abdominal pain gets worse.  You feel dizzy or you faint while standing.  You have fluid or blood coming from the catheter incision site.  The area around the catheter incision site feels warm to the touch.  You have pus or a bad smell coming from the catheter incision site.  You have a fever.  You have shortness of breath.  You have a rapid heartbeat.  Your nausea or vomiting does not go away.  Your catheter becomes blocked.  Your catheter comes out of your abdomen. Summary  After your procedure, it is common to have soreness near the incision site of your drainage tube (catheter).  Wash your hands with soap and water before and after you change your bandage (dressing). Change your dressing as told by your health care provider.  Check the catheter incision site every day for signs of infection. Check for redness, swelling, pain, fluid, blood, warmth, pus, or a bad smell.  Contact your health care provider if you have nausea or vomiting, or if you have redness, swelling, or pain around your catheter incision site.  Get help right away if your abdominal pain gets worse, you feel dizzy, you have blood or fluid coming from the catheter incision site, you have a fever, or you have shortness of breath. This information is not intended to replace advice given to you by your health care provider. Make sure you discuss any questions you have with your health care provider. Document Released: 09/15/2014 Document Revised:  07/22/2017 Document Reviewed: 07/22/2017 Elsevier Interactive Patient Education  [employer].

## 2018-05-25 ENCOUNTER — Encounter: Payer: Self-pay | Admitting: Internal Medicine

## 2018-05-25 DIAGNOSIS — K8000 Calculus of gallbladder with acute cholecystitis without obstruction: Secondary | ICD-10-CM | POA: Diagnosis not present

## 2018-05-25 DIAGNOSIS — G20 Parkinson's disease: Secondary | ICD-10-CM | POA: Diagnosis not present

## 2018-05-25 DIAGNOSIS — M1991 Primary osteoarthritis, unspecified site: Secondary | ICD-10-CM | POA: Diagnosis not present

## 2018-05-25 DIAGNOSIS — F411 Generalized anxiety disorder: Secondary | ICD-10-CM | POA: Diagnosis not present

## 2018-05-25 DIAGNOSIS — I272 Pulmonary hypertension, unspecified: Secondary | ICD-10-CM | POA: Diagnosis not present

## 2018-05-25 DIAGNOSIS — F329 Major depressive disorder, single episode, unspecified: Secondary | ICD-10-CM | POA: Diagnosis not present

## 2018-05-25 DIAGNOSIS — I10 Essential (primary) hypertension: Secondary | ICD-10-CM | POA: Diagnosis not present

## 2018-05-25 DIAGNOSIS — I25810 Atherosclerosis of coronary artery bypass graft(s) without angina pectoris: Secondary | ICD-10-CM | POA: Diagnosis not present

## 2018-05-25 DIAGNOSIS — Z48815 Encounter for surgical aftercare following surgery on the digestive system: Secondary | ICD-10-CM | POA: Diagnosis not present

## 2018-05-26 ENCOUNTER — Telehealth: Payer: Self-pay | Admitting: *Deleted

## 2018-05-26 MED ORDER — FAMOTIDINE 20 MG PO TABS: 20.0000 mg | ORAL_TABLET | Freq: Two times a day (BID) | ORAL | 3 refills | Status: DC

## 2018-05-26 MED ORDER — FAMOTIDINE 20 MG PO TABS: 20.0000 mg | ORAL_TABLET | Freq: Two times a day (BID) | ORAL | 0 refills | Status: DC

## 2018-05-26 NOTE — Telephone Encounter (Signed)
Sent month supply to [facility] for pt pepcid per pt request../lmb

## 2018-05-26 NOTE — Telephone Encounter (Signed)
Transition Care Management Follow-up Telephone Call   Date discharged? 05/24/18   How have you been since you were released from the hospital? Spoke w/wife who was asking husband questions as well. Pt states he feel ok, but just having problems w/his heart burn. Wife states she email dr. Jenny Reichmann this am requesting him to rx something since they had took him off his nexium. Verified chart inform wife MD replied back he would send in pepcid 20 mg twice a day to [place]. Wife is wanting rx sent to [facility] so he can get started on taking.    Do you understand why you were in the hospital? YES   Do you understand the discharge instructions? YES   Where were you discharged to? Home   Items Reviewed:  Medications reviewed: YES  Allergies reviewed: YES  Dietary changes reviewed: YES  Referrals reviewed: YES, will need to call for f/u appt w/cardiology and general surgeon. Neurologist appt is not until late October   Functional Questionnaire:   Activities of Daily Living (ADLs):   she states he are independent in the following: ambulation, bathing and hygiene, feeding, continence, grooming, toileting and dressing States he doesn't require assistance    Any transportation issues/concerns?: NO   Any patient concerns? NO   Confirmed importance and date/time of follow-up visits scheduled YES, (virtual) 05/30/18  Provider Appointment booked with Dr. Jenny Reichmann   Confirmed with patient if condition begins to worsen call PCP or go to the ER.  Patient was given the office number and encouraged to call back with question or concerns.  : YES

## 2018-05-27 ENCOUNTER — Other Ambulatory Visit: Payer: Self-pay | Admitting: Surgery

## 2018-05-27 DIAGNOSIS — K810 Acute cholecystitis: Secondary | ICD-10-CM

## 2018-05-28 DIAGNOSIS — M1991 Primary osteoarthritis, unspecified site: Secondary | ICD-10-CM | POA: Diagnosis not present

## 2018-05-28 DIAGNOSIS — K8000 Calculus of gallbladder with acute cholecystitis without obstruction: Secondary | ICD-10-CM | POA: Diagnosis not present

## 2018-05-28 DIAGNOSIS — F329 Major depressive disorder, single episode, unspecified: Secondary | ICD-10-CM | POA: Diagnosis not present

## 2018-05-28 DIAGNOSIS — I25810 Atherosclerosis of coronary artery bypass graft(s) without angina pectoris: Secondary | ICD-10-CM | POA: Diagnosis not present

## 2018-05-28 DIAGNOSIS — F411 Generalized anxiety disorder: Secondary | ICD-10-CM | POA: Diagnosis not present

## 2018-05-28 DIAGNOSIS — G20 Parkinson's disease: Secondary | ICD-10-CM | POA: Diagnosis not present

## 2018-05-28 DIAGNOSIS — I10 Essential (primary) hypertension: Secondary | ICD-10-CM | POA: Diagnosis not present

## 2018-05-28 DIAGNOSIS — I272 Pulmonary hypertension, unspecified: Secondary | ICD-10-CM | POA: Diagnosis not present

## 2018-05-28 DIAGNOSIS — Z48815 Encounter for surgical aftercare following surgery on the digestive system: Secondary | ICD-10-CM | POA: Diagnosis not present

## 2018-05-30 ENCOUNTER — Ambulatory Visit (INDEPENDENT_AMBULATORY_CARE_PROVIDER_SITE_OTHER): Payer: Medicare PPO | Admitting: Internal Medicine

## 2018-05-30 DIAGNOSIS — R351 Nocturia: Secondary | ICD-10-CM | POA: Diagnosis not present

## 2018-05-30 DIAGNOSIS — I10 Essential (primary) hypertension: Secondary | ICD-10-CM

## 2018-05-30 DIAGNOSIS — I25810 Atherosclerosis of coronary artery bypass graft(s) without angina pectoris: Secondary | ICD-10-CM | POA: Diagnosis not present

## 2018-05-30 DIAGNOSIS — K810 Acute cholecystitis: Secondary | ICD-10-CM | POA: Diagnosis not present

## 2018-05-30 DIAGNOSIS — I272 Pulmonary hypertension, unspecified: Secondary | ICD-10-CM | POA: Diagnosis not present

## 2018-05-30 DIAGNOSIS — F329 Major depressive disorder, single episode, unspecified: Secondary | ICD-10-CM | POA: Diagnosis not present

## 2018-05-30 DIAGNOSIS — G20 Parkinson's disease: Secondary | ICD-10-CM | POA: Diagnosis not present

## 2018-05-30 DIAGNOSIS — Z48815 Encounter for surgical aftercare following surgery on the digestive system: Secondary | ICD-10-CM | POA: Diagnosis not present

## 2018-05-30 DIAGNOSIS — F411 Generalized anxiety disorder: Secondary | ICD-10-CM | POA: Diagnosis not present

## 2018-05-30 DIAGNOSIS — K8000 Calculus of gallbladder with acute cholecystitis without obstruction: Secondary | ICD-10-CM | POA: Diagnosis not present

## 2018-05-30 DIAGNOSIS — M1991 Primary osteoarthritis, unspecified site: Secondary | ICD-10-CM | POA: Diagnosis not present

## 2018-05-30 MED ORDER — FAMOTIDINE 20 MG PO TABS: 20.0000 mg | ORAL_TABLET | Freq: Two times a day (BID) | ORAL | 11 refills | Status: DC

## 2018-05-30 NOTE — Progress Notes (Signed)
Patient ID: Brandon Rojas, male   DOB: 10/02/1945, 73 y.o.   MRN: [id number]  Virtual Visit via Video Note  I connected with [name] on 05/30/18 at  1:00 PM EDT by a video enabled telemedicine application and verified that I am speaking with the correct person using two identifiers.  Location: Patient: at home with wife present Provider: at office   I discussed the limitations of evaluation and management by telemedicine and the availability of in person appointments. The patient expressed understanding and agreed to proceed.  History of Present Illness: Here to f/u s/p hospn 5/12 - 5/16 with acute cholecystitis, elevated troponin in the setiing of CAD status post CABG, hypertension, Parkinson's disease HE c/o epigastric pain x 2 days.  In the ER on exam patient has right upper quadrant tenderness. CT abdomen followed by ultrasound shows features concerning for acute cholecystitis. WBC count elevated with normal LFTs. On-call general surgeon was consulted. Started on empiric antibiotics. Given history of CAD troponin was done which shows mild elevation. Cardiology was consulted. Troponin trended down.  Denied any chest pain.  ACS ruled out, no further work-up.  Cleared by cardiology.  On 05/22/2018 had a Perc Chole drain placed by interventional radiology and tolerated well.  Drain will remain for 6 to 8 weeks unless to the OR first as recommended by interventional radiology.  On the day of discharge, the patient was hemodynamically stable.  He will need to follow-up with his primary care provider, cardiology, interventional radiology, general surgery posthospitalization. Today, Pt denies chest pain, increased sob or doe, wheezing, orthopnea, PND, increased LE swelling, palpitations, dizziness or syncope.  Pt denies new neurological symptoms such as new headache, or facial or extremity weakness or numbness   Pt denies polydipsia, polyuria.   Pt denies fever, night sweats or other  constitutional symptoms though appetite not really returned yet.  Denies worsening reflux, worsening abd pain, dysphagia, n/v, bowel change or blood. He is taking pepcid only now for gerd, no longer on PPI.  Received quite a bit of IVF per pt, and only c/o today is of unusual nocturia about 5-6 times per night the last few night.  Denies urinary symptoms such as dysuria, urgency, flank pain, hematuria or n/v, fever, chills. Past Medical History:  Diagnosis Date  . Anxiety state, unspecified   . Arthritis   . Benign neoplasm of colon   . CAD (coronary artery disease) of artery bypass graft 05/20/2018  . Coronary atherosclerosis of unspecified type of vessel, native or graft   . Depressive disorder, not elsewhere classified   . Diaphragmatic hernia without mention of obstruction or gangrene   . Diverticulosis of colon (without mention of hemorrhage)   . Elevated troponin 05/20/2018  . Epigastric pain 05/20/2018  . Esophageal reflux   . Esophageal stricture   . Hiatal hernia   . Mitral regurgitation   . Osteoarthrosis, unspecified whether generalized or localized, unspecified site   . Other and unspecified hyperlipidemia   . Parkinson's disease ([place])   . Personal history of colonic polyps   . Personal history of unspecified circulatory disease   . Unspecified essential hypertension    Past Surgical History:  Procedure Laterality Date  . COLONOSCOPY    . CORONARY ARTERY BYPASS GRAFT     x 2  . INGUINAL HERNIA REPAIR     bilateral  . IR PERC CHOLECYSTOSTOMY  05/22/2018  . LUNG BIOPSY    . POLYPECTOMY    . PTCA  reports that he quit smoking about 46 years ago. He has never used smokeless tobacco. He reports that he does not drink alcohol or use drugs. family history includes Colon cancer in his sister; Fibromyalgia in his daughter; Healthy in his brother, brother, brother, daughter, and sister; Heart attack in his mother; Heart disease in his brother; Multiple sclerosis in his son;  Stroke in his father. Allergies  Allergen Reactions  . Atorvastatin Other (See Comments)    Muscle cramps  . Simvastatin Other (See Comments)    Muscle cramps   Current Outpatient Medications on File Prior to Visit  Medication Sig Dispense Refill  . amoxicillin-clavulanate (AUGMENTIN) 875-125 MG tablet Take 1 tablet by mouth every 12 (twelve) hours for 10 days. 20 tablet 0  . aspirin 81 MG tablet Take 81 mg by mouth daily.      . B Complex Vitamins (VITAMIN B COMPLEX PO) Take 1 tablet by mouth every other day.     . carbidopa-levodopa (SINEMET IR) 25-100 MG tablet Take 1 tablet by mouth 3 (three) times daily. 270 tablet 1  . clopidogrel (PLAVIX) 75 MG tablet TAKE 1 TABLET EVERY DAY WITH BREAKFAST (Patient taking differently: Take 75 mg by mouth daily. ) 90 tablet 1  . lovastatin (MEVACOR) 20 MG tablet TAKE 1 TABLET AT BEDTIME (Patient taking differently: Take 20 mg by mouth at bedtime. ) 90 tablet 1  . NITROSTAT 0.4 MG SL tablet PLACE 1 TABLET (0.4 MG TOTAL) UNDER THE TONGUE EVERY 5 (FIVE) MINUTES AS NEEDED. (Patient taking differently: Place 0.4 mg under the tongue every 5 (five) minutes as needed for chest pain. ) 25 tablet 5  . Omega-3 Fatty Acids (FISH OIL) 1000 MG CAPS Take 1,000-2,000 mg by mouth 2 (two) times a week.     . tetrahydrozoline-zinc (VISINE-AC) 0.05-0.25 % ophthalmic solution Place 2 drops into both eyes 3 (three) times daily as needed (for burning or eye redness).    [name] tiZANidine (ZANAFLEX) 2 MG tablet Take 1 tablet (2 mg total) by mouth every 6 (six) hours as needed for muscle spasms. 40 tablet 1  . traMADol (ULTRAM) 50 MG tablet Take 1 tablet (50 mg total) by mouth every 6 (six) hours as needed. for pain (Patient taking differently: Take 50 mg by mouth every 6 (six) hours as needed (for pain). ) 120 tablet 5  . vitamin E 400 UNIT capsule Take 400 Units by mouth 2 (two) times a week.      No current facility-administered medications on file prior to visit.      Observations/Objective: Alert, NAD, appropriate mood and affect, resps normal, cn 2-12 intact, moves all 4s, no visible rash or swelling Pt states VS this am per [facility] nurse:  118/60, 58, 97.3, 16, and sat 97% Lab Results  Component Value Date   WBC 9.3 05/23/2018   HGB 13.1 05/23/2018   HCT 38.0 (L) 05/23/2018   PLT 188 05/23/2018   GLUCOSE 121 (H) 05/23/2018   CHOL 149 05/15/2018   TRIG 145.0 05/15/2018   HDL 50.10 05/15/2018   LDLCALC 70 05/15/2018   ALT <5 05/23/2018   AST 24 05/23/2018   NA 136 05/23/2018   K 3.4 (L) 05/23/2018   CL 106 05/23/2018   CREATININE 1.05 05/23/2018   BUN 11 05/23/2018   CO2 22 05/23/2018   TSH 2.51 05/15/2018   PSA 1.04 05/15/2018   INR 1.2 05/22/2018   Assessment and Plan: See notes  Follow Up Instructions: See notes   I  discussed the assessment and treatment plan with the patient. The patient was provided an opportunity to ask questions and all were answered. The patient agreed with the plan and demonstrated an understanding of the instructions.   The patient was advised to call back or seek an in-person evaluation if the symptoms worsen or if the condition fails to improve as anticipated.   [name], MD

## 2018-05-30 NOTE — Patient Instructions (Addendum)
Please continue all other medications as before, and refills have been done if requested - the pepcid  Please have the pharmacy call with any other refills you may need.  Please continue your efforts at being more active, low cholesterol diet, and weight control.  You are otherwise up to date with prevention measures today.  Please keep your appointments with your specialists as you may have planned  We will send the order for urine studies to [facility].  Please return in 3 months, or sooner if needed

## 2018-05-31 ENCOUNTER — Encounter: Payer: Self-pay | Admitting: Internal Medicine

## 2018-05-31 NOTE — Assessment & Plan Note (Signed)
stable overall by history and exam, recent data reviewed with pt, and pt to continue medical treatment as before,  to f/u any worsening symptoms or concerns  

## 2018-05-31 NOTE — Assessment & Plan Note (Signed)
Etiology unclear, for UA and CX throught [place] [facility])

## 2018-05-31 NOTE — Assessment & Plan Note (Signed)
Stable, For speciality fu as planned with eventuall drain removal

## 2018-06-03 DIAGNOSIS — K8000 Calculus of gallbladder with acute cholecystitis without obstruction: Secondary | ICD-10-CM | POA: Diagnosis not present

## 2018-06-03 DIAGNOSIS — G20 Parkinson's disease: Secondary | ICD-10-CM | POA: Diagnosis not present

## 2018-06-03 DIAGNOSIS — Z48815 Encounter for surgical aftercare following surgery on the digestive system: Secondary | ICD-10-CM | POA: Diagnosis not present

## 2018-06-03 DIAGNOSIS — I25810 Atherosclerosis of coronary artery bypass graft(s) without angina pectoris: Secondary | ICD-10-CM | POA: Diagnosis not present

## 2018-06-03 DIAGNOSIS — F329 Major depressive disorder, single episode, unspecified: Secondary | ICD-10-CM | POA: Diagnosis not present

## 2018-06-03 DIAGNOSIS — I10 Essential (primary) hypertension: Secondary | ICD-10-CM | POA: Diagnosis not present

## 2018-06-03 DIAGNOSIS — F411 Generalized anxiety disorder: Secondary | ICD-10-CM | POA: Diagnosis not present

## 2018-06-03 DIAGNOSIS — I272 Pulmonary hypertension, unspecified: Secondary | ICD-10-CM | POA: Diagnosis not present

## 2018-06-03 DIAGNOSIS — M1991 Primary osteoarthritis, unspecified site: Secondary | ICD-10-CM | POA: Diagnosis not present

## 2018-06-03 DIAGNOSIS — R351 Nocturia: Secondary | ICD-10-CM | POA: Diagnosis not present

## 2018-06-05 DIAGNOSIS — I10 Essential (primary) hypertension: Secondary | ICD-10-CM | POA: Diagnosis not present

## 2018-06-05 DIAGNOSIS — M1991 Primary osteoarthritis, unspecified site: Secondary | ICD-10-CM | POA: Diagnosis not present

## 2018-06-05 DIAGNOSIS — F329 Major depressive disorder, single episode, unspecified: Secondary | ICD-10-CM | POA: Diagnosis not present

## 2018-06-05 DIAGNOSIS — I272 Pulmonary hypertension, unspecified: Secondary | ICD-10-CM | POA: Diagnosis not present

## 2018-06-05 DIAGNOSIS — Z48815 Encounter for surgical aftercare following surgery on the digestive system: Secondary | ICD-10-CM | POA: Diagnosis not present

## 2018-06-05 DIAGNOSIS — K8000 Calculus of gallbladder with acute cholecystitis without obstruction: Secondary | ICD-10-CM | POA: Diagnosis not present

## 2018-06-05 DIAGNOSIS — F411 Generalized anxiety disorder: Secondary | ICD-10-CM | POA: Diagnosis not present

## 2018-06-05 DIAGNOSIS — G20 Parkinson's disease: Secondary | ICD-10-CM | POA: Diagnosis not present

## 2018-06-05 DIAGNOSIS — I25810 Atherosclerosis of coronary artery bypass graft(s) without angina pectoris: Secondary | ICD-10-CM | POA: Diagnosis not present

## 2018-06-07 DIAGNOSIS — K828 Other specified diseases of gallbladder: Secondary | ICD-10-CM | POA: Diagnosis not present

## 2018-06-07 DIAGNOSIS — Z789 Other specified health status: Secondary | ICD-10-CM | POA: Diagnosis not present

## 2018-06-10 DIAGNOSIS — I10 Essential (primary) hypertension: Secondary | ICD-10-CM | POA: Diagnosis not present

## 2018-06-10 DIAGNOSIS — Z48815 Encounter for surgical aftercare following surgery on the digestive system: Secondary | ICD-10-CM | POA: Diagnosis not present

## 2018-06-10 DIAGNOSIS — F329 Major depressive disorder, single episode, unspecified: Secondary | ICD-10-CM | POA: Diagnosis not present

## 2018-06-10 DIAGNOSIS — I272 Pulmonary hypertension, unspecified: Secondary | ICD-10-CM | POA: Diagnosis not present

## 2018-06-10 DIAGNOSIS — G20 Parkinson's disease: Secondary | ICD-10-CM | POA: Diagnosis not present

## 2018-06-10 DIAGNOSIS — K8000 Calculus of gallbladder with acute cholecystitis without obstruction: Secondary | ICD-10-CM | POA: Diagnosis not present

## 2018-06-10 DIAGNOSIS — F411 Generalized anxiety disorder: Secondary | ICD-10-CM | POA: Diagnosis not present

## 2018-06-10 DIAGNOSIS — M1991 Primary osteoarthritis, unspecified site: Secondary | ICD-10-CM | POA: Diagnosis not present

## 2018-06-10 DIAGNOSIS — I25810 Atherosclerosis of coronary artery bypass graft(s) without angina pectoris: Secondary | ICD-10-CM | POA: Diagnosis not present

## 2018-06-13 DIAGNOSIS — I272 Pulmonary hypertension, unspecified: Secondary | ICD-10-CM | POA: Diagnosis not present

## 2018-06-13 DIAGNOSIS — I10 Essential (primary) hypertension: Secondary | ICD-10-CM | POA: Diagnosis not present

## 2018-06-13 DIAGNOSIS — Z48815 Encounter for surgical aftercare following surgery on the digestive system: Secondary | ICD-10-CM | POA: Diagnosis not present

## 2018-06-13 DIAGNOSIS — K8000 Calculus of gallbladder with acute cholecystitis without obstruction: Secondary | ICD-10-CM | POA: Diagnosis not present

## 2018-06-13 DIAGNOSIS — F329 Major depressive disorder, single episode, unspecified: Secondary | ICD-10-CM | POA: Diagnosis not present

## 2018-06-13 DIAGNOSIS — G20 Parkinson's disease: Secondary | ICD-10-CM | POA: Diagnosis not present

## 2018-06-13 DIAGNOSIS — F411 Generalized anxiety disorder: Secondary | ICD-10-CM | POA: Diagnosis not present

## 2018-06-13 DIAGNOSIS — M1991 Primary osteoarthritis, unspecified site: Secondary | ICD-10-CM | POA: Diagnosis not present

## 2018-06-13 DIAGNOSIS — I25810 Atherosclerosis of coronary artery bypass graft(s) without angina pectoris: Secondary | ICD-10-CM | POA: Diagnosis not present

## 2018-06-17 DIAGNOSIS — K8000 Calculus of gallbladder with acute cholecystitis without obstruction: Secondary | ICD-10-CM | POA: Diagnosis not present

## 2018-06-17 DIAGNOSIS — G20 Parkinson's disease: Secondary | ICD-10-CM | POA: Diagnosis not present

## 2018-06-17 DIAGNOSIS — I10 Essential (primary) hypertension: Secondary | ICD-10-CM | POA: Diagnosis not present

## 2018-06-17 DIAGNOSIS — M1991 Primary osteoarthritis, unspecified site: Secondary | ICD-10-CM | POA: Diagnosis not present

## 2018-06-17 DIAGNOSIS — I272 Pulmonary hypertension, unspecified: Secondary | ICD-10-CM | POA: Diagnosis not present

## 2018-06-17 DIAGNOSIS — Z48815 Encounter for surgical aftercare following surgery on the digestive system: Secondary | ICD-10-CM | POA: Diagnosis not present

## 2018-06-17 DIAGNOSIS — I25810 Atherosclerosis of coronary artery bypass graft(s) without angina pectoris: Secondary | ICD-10-CM | POA: Diagnosis not present

## 2018-06-17 DIAGNOSIS — F329 Major depressive disorder, single episode, unspecified: Secondary | ICD-10-CM | POA: Diagnosis not present

## 2018-06-17 DIAGNOSIS — F411 Generalized anxiety disorder: Secondary | ICD-10-CM | POA: Diagnosis not present

## 2018-06-19 DIAGNOSIS — I272 Pulmonary hypertension, unspecified: Secondary | ICD-10-CM | POA: Diagnosis not present

## 2018-06-19 DIAGNOSIS — Z48815 Encounter for surgical aftercare following surgery on the digestive system: Secondary | ICD-10-CM | POA: Diagnosis not present

## 2018-06-19 DIAGNOSIS — K8000 Calculus of gallbladder with acute cholecystitis without obstruction: Secondary | ICD-10-CM | POA: Diagnosis not present

## 2018-06-19 DIAGNOSIS — F329 Major depressive disorder, single episode, unspecified: Secondary | ICD-10-CM | POA: Diagnosis not present

## 2018-06-19 DIAGNOSIS — F411 Generalized anxiety disorder: Secondary | ICD-10-CM | POA: Diagnosis not present

## 2018-06-19 DIAGNOSIS — G20 Parkinson's disease: Secondary | ICD-10-CM | POA: Diagnosis not present

## 2018-06-19 DIAGNOSIS — I25810 Atherosclerosis of coronary artery bypass graft(s) without angina pectoris: Secondary | ICD-10-CM | POA: Diagnosis not present

## 2018-06-19 DIAGNOSIS — M1991 Primary osteoarthritis, unspecified site: Secondary | ICD-10-CM | POA: Diagnosis not present

## 2018-06-19 DIAGNOSIS — I10 Essential (primary) hypertension: Secondary | ICD-10-CM | POA: Diagnosis not present

## 2018-06-24 ENCOUNTER — Other Ambulatory Visit: Payer: Self-pay | Admitting: Surgery

## 2018-06-24 ENCOUNTER — Other Ambulatory Visit: Payer: Self-pay

## 2018-06-24 ENCOUNTER — Ambulatory Visit
Admission: RE | Admit: 2018-06-24 | Discharge: 2018-06-24 | Disposition: A | Discharge status: Home / Self Care | Payer: Medicare PPO | Source: Ambulatory Visit | Attending: Surgery | Admitting: Surgery

## 2018-06-24 ENCOUNTER — Encounter: Payer: Self-pay | Admitting: Radiology

## 2018-06-24 DIAGNOSIS — K810 Acute cholecystitis: Secondary | ICD-10-CM | POA: Diagnosis not present

## 2018-06-24 HISTORY — PX: IR RADIOLOGIST EVAL & MGMT: IMG5224

## 2018-06-24 NOTE — Progress Notes (Addendum)
Referring Physician(s): White,Christopher M  Chief Complaint: The patient is seen in follow up today s/p 05/22/2018 procedure: Successful ultrasound and fluoroscopic guided placement of a 10.2 French cholecystostomy tube.  History of present illness:  Acute cholecystitis, poor operative candidate. Request made for image guided placement of cholecystostomy tube for infection source control purposes.  Chole drain placed in IR 05/22/18- Dr Pascal Lux Pt is doing well; denies fever Denies pain Denies N/V OP about 50 cc daily per wife-- bile like color Not flushing No antibiotic at this time  To see Dr Deland Pretty 07/08/18 Unless Dr Orest Dikes office calls them to come in earlier after todays procdure  Scheduled today for injection of chole drain  Past Medical History:  Diagnosis Date  . Anxiety state, unspecified   . Arthritis   . Benign neoplasm of colon   . CAD (coronary artery disease) of artery bypass graft 05/20/2018  . Coronary atherosclerosis of unspecified type of vessel, native or graft   . Depressive disorder, not elsewhere classified   . Diaphragmatic hernia without mention of obstruction or gangrene   . Diverticulosis of colon (without mention of hemorrhage)   . Elevated troponin 05/20/2018  . Epigastric pain 05/20/2018  . Esophageal reflux   . Esophageal stricture   . Hiatal hernia   . Mitral regurgitation   . Osteoarthrosis, unspecified whether generalized or localized, unspecified site   . Other and unspecified hyperlipidemia   . Parkinson's disease ([place])   . Personal history of colonic polyps   . Personal history of unspecified circulatory disease   . Unspecified essential hypertension     Past Surgical History:  Procedure Laterality Date  . COLONOSCOPY    . CORONARY ARTERY BYPASS GRAFT     x 2  . INGUINAL HERNIA REPAIR     bilateral  . IR PERC CHOLECYSTOSTOMY  05/22/2018  . IR RADIOLOGIST EVAL & MGMT  06/24/2018  . LUNG BIOPSY    . POLYPECTOMY    . PTCA       Allergies: Atorvastatin and Simvastatin  Medications: Prior to Admission medications   Medication Sig Start Date End Date Taking? Authorizing Provider  aspirin 81 MG tablet Take 81 mg by mouth daily.      [provider]  B Complex Vitamins (VITAMIN B COMPLEX PO) Take 1 tablet by mouth every other day.     [provider]  carbidopa-levodopa (SINEMET IR) 25-100 MG tablet Take 1 tablet by mouth 3 (three) times daily. 05/06/18   Tat, Eustace Quail, DO  clopidogrel (PLAVIX) 75 MG tablet TAKE 1 TABLET EVERY DAY WITH BREAKFAST Patient taking differently: Take 75 mg by mouth daily.  04/29/18   Biagio Borg, MD  famotidine (PEPCID) 20 MG tablet Take 1 tablet (20 mg total) by mouth 2 (two) times daily. 05/30/18   Biagio Borg, MD  lovastatin (MEVACOR) 20 MG tablet TAKE 1 TABLET AT BEDTIME Patient taking differently: Take 20 mg by mouth at bedtime.  04/29/18   Biagio Borg, MD  NITROSTAT 0.4 MG SL tablet PLACE 1 TABLET (0.4 MG TOTAL) UNDER THE TONGUE EVERY 5 (FIVE) MINUTES AS NEEDED. Patient taking differently: Place 0.4 mg under the tongue every 5 (five) minutes as needed for chest pain.  09/21/14   Minus Breeding, MD  Omega-3 Fatty Acids (FISH OIL) 1000 MG CAPS Take 1,000-2,000 mg by mouth 2 (two) times a week.     [provider]  tetrahydrozoline-zinc (VISINE-AC) 0.05-0.25 % ophthalmic solution Place 2 drops into  both eyes 3 (three) times daily as needed (for burning or eye redness).    [provider]  tiZANidine (ZANAFLEX) 2 MG tablet Take 1 tablet (2 mg total) by mouth every 6 (six) hours as needed for muscle spasms. 11/12/17   Biagio Borg, MD  traMADol (ULTRAM) 50 MG tablet Take 1 tablet (50 mg total) by mouth every 6 (six) hours as needed. for pain Patient taking differently: Take 50 mg by mouth every 6 (six) hours as needed (for pain).  05/13/18   Biagio Borg, MD  vitamin E 400 UNIT capsule Take 400 Units by mouth 2 (two) times a week.     [provider]     Family History  Problem Relation Age of Onset  . Heart attack Mother   . Stroke Father   . Colon cancer Sister   . Healthy Sister   . Heart disease Brother   . Healthy Brother   . Healthy Brother   . Healthy Brother   . Healthy Daughter   . Fibromyalgia Daughter   . Multiple sclerosis Son     Social History   Socioeconomic History  . Marital status: Married    Spouse name: Not on file  . Number of children: 3  . Years of education: Not on file  . Highest education level: Not on file  Occupational History  . Occupation: Mining engineer: [employer]  Social Needs  . Financial resource strain: Not on file  . Food insecurity    Worry: Not on file    Inability: Not on file  . Transportation needs    Medical: Not on file    Non-medical: Not on file  Tobacco Use  . Smoking status: Former Smoker    Quit date: 02/19/1972    Years since quitting: 46.3  . Smokeless tobacco: Never Used  Substance and Sexual Activity  . Alcohol use: No    Alcohol/week: 0.0 standard drinks  . Drug use: No  . Sexual activity: Not on file  Lifestyle  . Physical activity    Days per week: Not on file    Minutes per session: Not on file  . Stress: Not on file  Relationships  . Social Herbalist on phone: Not on file    Gets together: Not on file    Attends religious service: Not on file    Active member of club or organization: Not on file    Attends meetings of clubs or organizations: Not on file    Relationship status: Not on file  Other Topics Concern  . Not on file  Social History Narrative  . Not on file     Vital Signs: BP (!) 164/86   Pulse (!) 57   Temp (!) 97.1 F (36.2 C)   SpO2 98%   Physical Exam Vitals signs reviewed.  Skin:    General: Skin is warm and dry.     Comments: Site is clean and dry NT no bleeding No sign of infection OP is thin brown fluid  Injection does reveal good position of drain + emptying GB      Imaging: Ir Radiologist Eval & Mgmt  Result Date: 06/24/2018 Please refer to notes tab for details about interventional procedure. (Op Note)   Labs:  CBC: Recent Labs    05/20/18 1453 05/21/18 0407 05/22/18 0637 05/23/18 0417  WBC 16.1* 16.8* 15.7* 9.3  HGB 14.5 14.3 12.9* 13.1  HCT 42.7 42.2 37.1* 38.0*  PLT 213 209 167 188    COAGS: Recent Labs    05/22/18 1314  INR 1.2    BMP: Recent Labs    05/20/18 1453 05/21/18 0407 05/22/18 0637 05/23/18 0417  NA 138 138 138 136  K 4.0 3.8 3.8 3.4*  CL 103 103 104 106  CO2 23 25 25 22   GLUCOSE 121* 124* 104* 121*  BUN 11 13 13 11   CALCIUM 9.9 9.4 8.8* 8.5*  CREATININE 1.05 1.08 1.05 1.05  GFRNONAA >60 >60 >60 >60  GFRAA >60 >60 >60 >60    LIVER FUNCTION TESTS: Recent Labs    05/20/18 1453 05/21/18 0407 05/22/18 0637 05/23/18 0417  BILITOT 0.9 0.8 1.0 1.0  AST 23 20 21 24   ALT 8 26 21  <5  ALKPHOS 60 58 64 70  PROT 6.5 5.9* 5.4* 5.6*  ALBUMIN 3.6 3.1* 2.5* 2.4*    Assessment:  Cholecystitis Percutaneous chole drain placed in IR 05/22/18 Injection today does reveal +emptying GB with drain in good position Will cap tube today per Dr Pascal Lux If pain; fever or N/V were to develop--- wife knows to hook drain back to bag for external collection (instructions to wife with good understanding) Re see pt in 1 week for injection only-- possible drain removal We will inform Dr Dema Severin of our plan; Ms Deason to call Dr Dema Severin office to see if needs to be seen sooner than 6/30. She has good understanding of this plan  Signed: Lavonia Drafts, PA-C 06/24/2018, 1:42 PM   Please refer to Dr. Pascal Lux attestation of this note for management and plan.

## 2018-06-25 DIAGNOSIS — Z48815 Encounter for surgical aftercare following surgery on the digestive system: Secondary | ICD-10-CM | POA: Diagnosis not present

## 2018-06-25 DIAGNOSIS — I10 Essential (primary) hypertension: Secondary | ICD-10-CM | POA: Diagnosis not present

## 2018-06-25 DIAGNOSIS — M1991 Primary osteoarthritis, unspecified site: Secondary | ICD-10-CM | POA: Diagnosis not present

## 2018-06-25 DIAGNOSIS — I272 Pulmonary hypertension, unspecified: Secondary | ICD-10-CM | POA: Diagnosis not present

## 2018-06-25 DIAGNOSIS — G20 Parkinson's disease: Secondary | ICD-10-CM | POA: Diagnosis not present

## 2018-06-25 DIAGNOSIS — I25810 Atherosclerosis of coronary artery bypass graft(s) without angina pectoris: Secondary | ICD-10-CM | POA: Diagnosis not present

## 2018-06-25 DIAGNOSIS — F411 Generalized anxiety disorder: Secondary | ICD-10-CM | POA: Diagnosis not present

## 2018-06-25 DIAGNOSIS — K8000 Calculus of gallbladder with acute cholecystitis without obstruction: Secondary | ICD-10-CM | POA: Diagnosis not present

## 2018-06-25 DIAGNOSIS — F329 Major depressive disorder, single episode, unspecified: Secondary | ICD-10-CM | POA: Diagnosis not present

## 2018-07-01 ENCOUNTER — Encounter: Payer: Self-pay | Admitting: Radiology

## 2018-07-01 ENCOUNTER — Ambulatory Visit
Admission: RE | Admit: 2018-07-01 | Discharge: 2018-07-01 | Disposition: A | Discharge status: Home / Self Care | Payer: Medicare PPO | Source: Ambulatory Visit | Attending: Surgery | Admitting: Surgery

## 2018-07-01 DIAGNOSIS — K810 Acute cholecystitis: Secondary | ICD-10-CM

## 2018-07-01 DIAGNOSIS — U071 COVID-19: Secondary | ICD-10-CM | POA: Diagnosis not present

## 2018-07-01 HISTORY — PX: IR RADIOLOGIST EVAL & MGMT: IMG5224

## 2018-07-01 NOTE — Progress Notes (Signed)
Chief Complaint: Cholecystitis  Referring Physician(s): [place] M  Supervising Physician: Aletta Edouard  History of Present Illness: Brandon Rojas is a 73 y.o. male who is s/p uccessful ultrasound and fluoroscopic guided placement of a 10.2 French cholecystostomy tube done by Dr. Pascal Lux on 05/22/2018.  He was seen in clinic last week and injection showed patency of the cystic duct. The drain was capped.  That evening, the patient starting having abdominal pain so his wife hooked the bag back up.  He states he got immediate relief after the bag was hooked back up.  She tells me she hopes it can come out soon because she hates dealing with the drain.  He has an appointment 6/30 with Dr. Dema Severin to plan lap Chole.  No nausea/vomiting. No Fever/chills. ROS negative.   Past Medical History:  Diagnosis Date   Anxiety state, unspecified    Arthritis    Benign neoplasm of colon    CAD (coronary artery disease) of artery bypass graft 05/20/2018   Coronary atherosclerosis of unspecified type of vessel, native or graft    Depressive disorder, not elsewhere classified    Diaphragmatic hernia without mention of obstruction or gangrene    Diverticulosis of colon (without mention of hemorrhage)    Elevated troponin 05/20/2018   Epigastric pain 05/20/2018   Esophageal reflux    Esophageal stricture    Hiatal hernia    Mitral regurgitation    Osteoarthrosis, unspecified whether generalized or localized, unspecified site    Other and unspecified hyperlipidemia    Parkinson's disease ([place])    Personal history of colonic polyps    Personal history of unspecified circulatory disease    Unspecified essential hypertension     Past Surgical History:  Procedure Laterality Date   COLONOSCOPY     CORONARY ARTERY BYPASS GRAFT     x 2   INGUINAL HERNIA REPAIR     bilateral   IR PERC CHOLECYSTOSTOMY  05/22/2018   IR RADIOLOGIST EVAL & MGMT  06/24/2018    LUNG BIOPSY     POLYPECTOMY     PTCA      Allergies: Atorvastatin and Simvastatin  Medications: Prior to Admission medications   Medication Sig Start Date End Date Taking? Authorizing Provider  aspirin 81 MG tablet Take 81 mg by mouth daily.      [provider]  B Complex Vitamins (VITAMIN B COMPLEX PO) Take 1 tablet by mouth every other day.     [provider]  carbidopa-levodopa (SINEMET IR) 25-100 MG tablet Take 1 tablet by mouth 3 (three) times daily. 05/06/18   Tat, Eustace Quail, DO  clopidogrel (PLAVIX) 75 MG tablet TAKE 1 TABLET EVERY DAY WITH BREAKFAST Patient taking differently: Take 75 mg by mouth daily.  04/29/18   Biagio Borg, MD  famotidine (PEPCID) 20 MG tablet Take 1 tablet (20 mg total) by mouth 2 (two) times daily. 05/30/18   Biagio Borg, MD  lovastatin (MEVACOR) 20 MG tablet TAKE 1 TABLET AT BEDTIME Patient taking differently: Take 20 mg by mouth at bedtime.  04/29/18   Biagio Borg, MD  NITROSTAT 0.4 MG SL tablet PLACE 1 TABLET (0.4 MG TOTAL) UNDER THE TONGUE EVERY 5 (FIVE) MINUTES AS NEEDED. Patient taking differently: Place 0.4 mg under the tongue every 5 (five) minutes as needed for chest pain.  09/21/14   Minus Breeding, MD  Omega-3 Fatty Acids (FISH OIL) 1000 MG CAPS Take 1,000-2,000 mg by mouth 2 (two) times  a week.     [provider]  tetrahydrozoline-zinc (VISINE-AC) 0.05-0.25 % ophthalmic solution Place 2 drops into both eyes 3 (three) times daily as needed (for burning or eye redness).    [provider]  tiZANidine (ZANAFLEX) 2 MG tablet Take 1 tablet (2 mg total) by mouth every 6 (six) hours as needed for muscle spasms. 11/12/17   Biagio Borg, MD  traMADol (ULTRAM) 50 MG tablet Take 1 tablet (50 mg total) by mouth every 6 (six) hours as needed. for pain Patient taking differently: Take 50 mg by mouth every 6 (six) hours as needed (for pain).  05/13/18   Biagio Borg, MD  vitamin E 400 UNIT capsule Take 400 Units by mouth  2 (two) times a week.     [provider]     Family History  Problem Relation Age of Onset   Heart attack Mother    Stroke Father    Colon cancer Sister    Healthy Sister    Heart disease Brother    Healthy Brother    Healthy Brother    Healthy Brother    Healthy Daughter    Fibromyalgia Daughter    Multiple sclerosis Son     Social History   Socioeconomic History   Marital status: Married    Spouse name: Not on file   Number of children: 3   Years of education: Not on file   Highest education level: Not on file  Occupational History   Occupation: PASTOR    Employer: [employer] resource strain: Not on file   Food insecurity    Worry: Not on file    Inability: Not on file   Transportation needs    Medical: Not on file    Non-medical: Not on file  Tobacco Use   Smoking status: Former Smoker    Quit date: 02/19/1972    Years since quitting: 46.3   Smokeless tobacco: Never Used  Substance and Sexual Activity   Alcohol use: No    Alcohol/week: 0.0 standard drinks   Drug use: No   Sexual activity: Not on file  Lifestyle   Physical activity    Days per week: Not on file    Minutes per session: Not on file   Stress: Not on file  Relationships   Social connections    Talks on phone: Not on file    Gets together: Not on file    Attends religious service: Not on file    Active member of club or organization: Not on file    Attends meetings of clubs or organizations: Not on file    Relationship status: Not on file  Other Topics Concern   Not on file  Social History Narrative   Not on file     Review of Systems: A 12 point ROS discussed and pertinent positives are indicated in the HPI above.  All other systems are negative.  Review of Systems  Vital Signs: There were no vitals taken for this visit.  Physical Exam Awake and alert NAD Drain in place RUQ Clear drainage in the  bag, not bilious at all, some debris. Abdomen soft, NTND.     Imaging: Dg Cholangiogram  Existing Tube  Result Date: 06/24/2018 INDICATION: History of acute cholecystitis, post ultrasound fluoroscopic guided cholecystostomy tube placement on 05/22/2018. Patient is not flushing the cholecystostomy tube EXAM: FLUOROSCOPIC GUIDED CHOLECYSTOSTOMY TUBE INJECTION COMPARISON:  Image guided cholecystostomy tube  placement - 05/22/2018; CT abdomen pelvis - 05/20/2018; right upper quadrant abdominal ultrasound - 05/20/2018 MEDICATIONS: None ANESTHESIA/SEDATION: None CONTRAST:  15 cc Omnipaque 300 - administered into the gallbladder fossa. FLUOROSCOPY TIME:  1 minute, 6 seconds (58 mGy) COMPLICATIONS: None immediate. PROCEDURE: The patient was positioned supine on the fluoroscopy table. A preprocedural spot fluoroscopic image was obtained of the right upper abdominal quadrant existing cholecystostomy tube. Multiple spot fluoroscopic radiographic images were obtained of the right upper abdominal quadrant an existing cholecystostomy tube following injection of a small amount of contrast. Images were reviewed and discussed with the patient. The cholecystostomy tube was flushed with a small amount of saline and capped. A dressing was placed. The patient tolerated the procedure well without immediate postprocedural complication. FINDINGS: Preprocedural spot fluoroscopic image of the right upper abdominal quadrant demonstrates grossly unchanged positioning of the cholecystostomy tube with end coiled and locked overlying the expected location of the gallbladder fossa. Subsequent contrast injection demonstrates appropriate functionality of the cholecystostomy tube with brisk opacification of the gallbladder. There is passage of contrast from the gallbladder through the cystic and common bile ducts the level duodenum. No definitive intraluminal filling defect to suggest the presence of cholelithiasis or choledocholithiasis.  IMPRESSION: 1. Appropriately positioned and functioning cholecystostomy tube. 2. No evidence of cholelithiasis or choledocholithiasis. PLAN: The patient's cholecystostomy tube was capped for a trial of internalization. The patient was given an extra gravity bag and instructed to reconnect the cholecystostomy tube to the gravity bag if he were to experience recurrent right upper quadrant obstructive symptoms. The patient demonstrated fair understanding of this discussion. Patient was instructed to maintain follow-up appointment with Dr. Dema Severin to determinate his ultimate operative candidacy. The patient will return to the interventional radiology clinic in 1 week. If the patient passes a trial cholecystostomy tube capping, consideration for cholecystostomy tube removal could be considered pending his ultimate operative candidacy. Electronically Signed   By: Sandi Mariscal M.D.   On: 06/24/2018 15:05   Ir Radiologist Eval & Mgmt  Result Date: 06/24/2018 Please refer to notes tab for details about interventional procedure. (Op Note)   Labs:  CBC: Recent Labs    05/20/18 1453 05/21/18 0407 05/22/18 0637 05/23/18 0417  WBC 16.1* 16.8* 15.7* 9.3  HGB 14.5 14.3 12.9* 13.1  HCT 42.7 42.2 37.1* 38.0*  PLT 213 209 167 188    COAGS: Recent Labs    05/22/18 1314  INR 1.2    BMP: Recent Labs    05/20/18 1453 05/21/18 0407 05/22/18 0637 05/23/18 0417  NA 138 138 138 136  K 4.0 3.8 3.8 3.4*  CL 103 103 104 106  CO2 23 25 25 22   GLUCOSE 121* 124* 104* 121*  BUN 11 13 13 11   CALCIUM 9.9 9.4 8.8* 8.5*  CREATININE 1.05 1.08 1.05 1.05  GFRNONAA >60 >60 >60 >60  GFRAA >60 >60 >60 >60    LIVER FUNCTION TESTS: Recent Labs    05/20/18 1453 05/21/18 0407 05/22/18 0637 05/23/18 0417  BILITOT 0.9 0.8 1.0 1.0  AST 23 20 21 24   ALT 8 26 21  <5  ALKPHOS 60 58 64 70  PROT 6.5 5.9* 5.4* 5.6*  ALBUMIN 3.6 3.1* 2.5* 2.4*    TUMOR MARKERS: No results for input(s): AFPTM, CEA, CA199, CHROMGRNA  in the last 8760 hours.  Assessment/Plan:  Acute calculous Cholecystitis   S/P percutaneous cholecystostomy by Dr. Pascal Lux on 05/22/2018.  Will leave drain to gravity bag since patient became symptomatic after capping trial.  Hopefully  he will undergo Lap Chole soon.  No need to return here if patient undergoes Lap Chole in the next 2-4 weeks.   Electronically Signed: Murrell Redden PA-C 07/01/2018, 1:15 PM    Please refer to Dr. Margaretmary Dys attestation of this note for management and plan.

## 2018-07-02 DIAGNOSIS — I25810 Atherosclerosis of coronary artery bypass graft(s) without angina pectoris: Secondary | ICD-10-CM | POA: Diagnosis not present

## 2018-07-02 DIAGNOSIS — F411 Generalized anxiety disorder: Secondary | ICD-10-CM | POA: Diagnosis not present

## 2018-07-02 DIAGNOSIS — Z48815 Encounter for surgical aftercare following surgery on the digestive system: Secondary | ICD-10-CM | POA: Diagnosis not present

## 2018-07-02 DIAGNOSIS — I10 Essential (primary) hypertension: Secondary | ICD-10-CM | POA: Diagnosis not present

## 2018-07-02 DIAGNOSIS — M1991 Primary osteoarthritis, unspecified site: Secondary | ICD-10-CM | POA: Diagnosis not present

## 2018-07-02 DIAGNOSIS — I272 Pulmonary hypertension, unspecified: Secondary | ICD-10-CM | POA: Diagnosis not present

## 2018-07-02 DIAGNOSIS — F329 Major depressive disorder, single episode, unspecified: Secondary | ICD-10-CM | POA: Diagnosis not present

## 2018-07-02 DIAGNOSIS — K8000 Calculus of gallbladder with acute cholecystitis without obstruction: Secondary | ICD-10-CM | POA: Diagnosis not present

## 2018-07-02 DIAGNOSIS — G20 Parkinson's disease: Secondary | ICD-10-CM | POA: Diagnosis not present

## 2018-07-08 ENCOUNTER — Telehealth: Payer: Self-pay

## 2018-07-08 DIAGNOSIS — K811 Chronic cholecystitis: Secondary | ICD-10-CM | POA: Diagnosis not present

## 2018-07-08 NOTE — Telephone Encounter (Signed)
   [facility] Pre-operative Risk Assessment    Request for surgical clearance:  1. What type of surgery is being performed? Cholecystectomy-cholecystitis    2. When is this surgery scheduled? TBD   3. What type of clearance is required (medical clearance vs. Pharmacy clearance to hold med vs. Both)? MEDICAL CLEARANCE   4. Are there any medications that need to be held prior to surgery and how long? NONE LISTED   5. Practice name and name of physician performing surgery? [facility]   6. What is your office phone number [phone]    7.   What is your office fax number [phone]  8.   Anesthesia type (None, local, MAC, general) ? GENERA;   Waylan Rocher 07/08/2018, 2:33 PM  _________________________________________________________________   (provider comments below)

## 2018-07-08 NOTE — Telephone Encounter (Signed)
Left voicemail on both numbers

## 2018-07-09 DIAGNOSIS — K8000 Calculus of gallbladder with acute cholecystitis without obstruction: Secondary | ICD-10-CM | POA: Diagnosis not present

## 2018-07-09 DIAGNOSIS — G20 Parkinson's disease: Secondary | ICD-10-CM | POA: Diagnosis not present

## 2018-07-09 DIAGNOSIS — I10 Essential (primary) hypertension: Secondary | ICD-10-CM | POA: Diagnosis not present

## 2018-07-09 DIAGNOSIS — I25810 Atherosclerosis of coronary artery bypass graft(s) without angina pectoris: Secondary | ICD-10-CM | POA: Diagnosis not present

## 2018-07-09 DIAGNOSIS — F411 Generalized anxiety disorder: Secondary | ICD-10-CM | POA: Diagnosis not present

## 2018-07-09 DIAGNOSIS — M1991 Primary osteoarthritis, unspecified site: Secondary | ICD-10-CM | POA: Diagnosis not present

## 2018-07-09 DIAGNOSIS — F329 Major depressive disorder, single episode, unspecified: Secondary | ICD-10-CM | POA: Diagnosis not present

## 2018-07-09 DIAGNOSIS — I272 Pulmonary hypertension, unspecified: Secondary | ICD-10-CM | POA: Diagnosis not present

## 2018-07-09 DIAGNOSIS — Z48815 Encounter for surgical aftercare following surgery on the digestive system: Secondary | ICD-10-CM | POA: Diagnosis not present

## 2018-07-09 NOTE — Telephone Encounter (Signed)
   Primary Cardiologist: Minus Breeding, MD  Chart reviewed as part of pre-operative protocol coverage. See Dr Judeth Cornfield note from 05/23/2018. Pt contacted today and has been doing well from a cardiac standpoint. Given past medical history and time since last visit, based on ACC/AHA guidelines, [place] would be at acceptable risk for the planned procedure without further cardiovascular testing.   Ok to hold Plavix 5-7 days pre op, ASA 3 days pre op if needed.   I will route this recommendation to the requesting party via Epic fax function and remove from pre-op pool.  Please call with questions.  Kerin Ransom, PA-C 07/09/2018, 9:29 AM

## 2018-07-09 NOTE — Telephone Encounter (Signed)
Follow up   Patient's wife is returning your call. Please call. 

## 2018-07-14 ENCOUNTER — Encounter: Payer: Self-pay | Admitting: Internal Medicine

## 2018-07-15 ENCOUNTER — Encounter: Payer: Self-pay | Admitting: Internal Medicine

## 2018-07-21 DIAGNOSIS — I10 Essential (primary) hypertension: Secondary | ICD-10-CM | POA: Diagnosis not present

## 2018-07-21 DIAGNOSIS — G20 Parkinson's disease: Secondary | ICD-10-CM | POA: Diagnosis not present

## 2018-07-21 DIAGNOSIS — I25810 Atherosclerosis of coronary artery bypass graft(s) without angina pectoris: Secondary | ICD-10-CM | POA: Diagnosis not present

## 2018-07-21 DIAGNOSIS — I272 Pulmonary hypertension, unspecified: Secondary | ICD-10-CM | POA: Diagnosis not present

## 2018-07-21 DIAGNOSIS — Z48815 Encounter for surgical aftercare following surgery on the digestive system: Secondary | ICD-10-CM | POA: Diagnosis not present

## 2018-07-21 DIAGNOSIS — F411 Generalized anxiety disorder: Secondary | ICD-10-CM | POA: Diagnosis not present

## 2018-07-21 DIAGNOSIS — M1991 Primary osteoarthritis, unspecified site: Secondary | ICD-10-CM | POA: Diagnosis not present

## 2018-07-21 DIAGNOSIS — F329 Major depressive disorder, single episode, unspecified: Secondary | ICD-10-CM | POA: Diagnosis not present

## 2018-07-21 DIAGNOSIS — K8000 Calculus of gallbladder with acute cholecystitis without obstruction: Secondary | ICD-10-CM | POA: Diagnosis not present

## 2018-07-22 DIAGNOSIS — K811 Chronic cholecystitis: Secondary | ICD-10-CM | POA: Diagnosis not present

## 2018-07-29 ENCOUNTER — Ambulatory Visit: Payer: Self-pay | Admitting: Surgery

## 2018-07-29 ENCOUNTER — Other Ambulatory Visit: Payer: Self-pay | Admitting: Internal Medicine

## 2018-07-29 NOTE — H&P (Signed)
CC: Hospital f/u - perc chole drain placement  HPI: Brandon Rojas is a very pleasant [phone] with hx of MR, CAD s/p CABG (2006, Dr. Prescott Gum) - on ASA 81 + Plavix, HTN whom presented to [facility] 05/20/2018 with epigastric pain started 2 days prior. He does not have a Mykale Gandolfo blood cell count of 16.1. LFTs and lipase were normal. CT scan of the abdomen and pelvis demonstrated cholecystitis with cholelithiasis. Right upper quadrant ultrasound suggested cholecystitis. He was found to have it troponin elevated at 0.06 in cardiology evaluated him. Also thought to probably have gangrenous cholecystitis given his symptom constellation and severe RUQ pain. He was seen by IR for placement of percutaneous cholecystostomy tube which was placed 05/22/2018. He recovered well from all this and was discharged home. He resides at home with his wife. He underwent drain study 06/24/2018 which demonstrated a normal appearing filling gallbladder as well as patent cystic and common bile duct. He subsequently underwent a capping trial within 24 hours is having significant right upper quadrant discomfort. The drain is placed back to gravity and his symptoms resolved. He returns here for follow-up.  He denies any fevers/chills/nausea/vomiting since his drain has been opened. The drain typically has a clear effluent low in volume.  INTERVAL HX He received clearance from Dr. Percival Spanish and plan for plavix to be held 5 days prior to surgery. He returns today for follow-up. Doing well with drain in place. No further issues with pain since it was unclamped. Tolerating diet, denies f/c/n/v. Drain output remains low in volume and clear in color.  PMH: Parkinson's; CAD (s/p CABG 2006; now on ASA 81 + plavix, Dr. Percival Spanish); HTN; osteoarthritis (sxs well controlled with daily tramadol); cholecystitis (now well controlled with perc chole drain)  PSH: Denies any prior abdominal/pelvic surgeries  FHx: Denies FHx of malignancy  Social:  Denies use of tobacco/EtOH/drugs. 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 He lives at home with his wife whom is his primary caretaker.   ROS: A comprehensive 10 system review of systems was completed with the patient and pertinent findings as noted above.  The patient is a 73 year old male.   Past Surgical History (Tanisha A. Owens Shark, [place]; 07/22/2018 9:14 AM) Bypass Surgery for Poor Blood Flow to Legs   Allergies (Tanisha A. Owens Shark, RMA; 07/22/2018 9:15 AM) Atorvastatin Calcium *ANTIHYPERLIPIDEMICS*  Simvastatin *ANTIHYPERLIPIDEMICS*  Allergies Reconciled   Medication History (Tanisha A. Owens Shark, RMA; 07/22/2018 9:15 AM) Carbidopa-Levodopa (25-100MG  Tablet, Oral) Active. Clopidogrel Bisulfate (75MG  Tablet, Oral) Active. Lovastatin (20MG  Tablet, Oral) Active. Aspirin (81MG  Tablet, Oral) Active. Medications Reconciled  Social History (Tanisha A. Owens Shark, [place]; 07/22/2018 9:14 AM) Caffeine use  Coffee. No drug use  Tobacco use  Former smoker.  Family History (Tanisha A. Owens Shark, [place]; 07/22/2018 9:14 AM) Arthritis  Father. Diabetes Mellitus  Mother. Heart Disease  Mother. Hypertension  Mother.  Other Problems (Tanisha A. Owens Shark, [place]; 07/22/2018 9:14 AM) Arthritis  Back Pain  Gastroesophageal Reflux Disease     Review of Systems Harrell Gave M. Riti Rollyson MD; 07/22/2018 9:29 AM) General Not Present- Chills and Fever. Skin Present- Bruising. Not Present- Brittle Nails. HEENT Not Present- Double Vision and Headache. Respiratory Present- Decreased Exercise Tolerance. Not Present- Cough. Cardiovascular Not Present- Chest Pain and Difficulty Breathing Lying Down. Gastrointestinal Not Present- Abdominal Pain, Nausea and Vomiting. Musculoskeletal Present- Back Pain. Not Present- Decreased Range of Motion. Neurological Not Present- Decreased Memory and Difficulty Speaking. Psychiatric Not Present- Anxiety and Depression. Hematology Present- Blood Thinners and Easy Bruising.  Vitals  (Tanisha A. Brown RMA; 07/22/2018 9:15 AM) 07/22/2018 9:15 AM Weight: 145.2 lb Height: 63in Body Surface Area: 1.69 m Body Mass Index: 25.72 kg/m  Temp.: 98.45F  Pulse: 67 (Regular)  BP: 118/84(Sitting, Left Arm, Standard)       Physical Exam Harrell Gave M. Keishia Ground MD; 07/22/2018 9:29 AM) The physical exam findings are as follows: Note: Constitutional: No acute distress; conversant; no deformities Eyes: Moist conjunctiva; no lid lag; anicteric sclerae; pupils equal round and reactive to light Neck: Trachea midline; no palpable thyromegaly Lungs: Normal respiratory effort; no tactile fremitus CV: Regular rate and rhythm; no palpable thrill; no pitting edema GI: Abdomen soft, nontender, nondistended; no palpable hepatosplenomegaly. Percutaneous cholecystostomy tube in place RUQ; drain site clean; gravity bag with small amount of clear fluid in appliance, remains without bile tinge. MSK: Normal gait; no clubbing/cyanosis Psychiatric: Appropriate affect; alert and oriented 3 Lymphatic: No palpable cervical or axillary lymphadenopathy    Assessment & Plan Harrell Gave M. Nareh Matzke MD; 07/22/2018 9:32 AM)  CHRONIC CHOLECYSTITIS (K81.1) Story: Brandon Rojas is a very pleasant 21yoM with hx of HTN, CAD, Parkinson's, he was admitted to hospital 05/20/2018 with what is most likely acute gangrenous cholecystitis. Intraoperative cholecystostomy tube placement 05/22/2018. He recovered well. He had recurrence of his pain following a capping trial earlier this month. Impression: -We will plan to obtain clearances from both his PCP, Dr. [name] and his cardiologist, Dr. Percival Spanish -The anatomy and physiology of the hepatobiliary system was discussed at length with the patient and his wife with associated pictures. The pathophysiology of gallbladder disease was discussed at length with associated pictures as well. -The options for treatment were discussed including ongoing observation (with  definitive tube drainage) as well as surgery - laparoscopic and possible open cholecystectomy, possible subtotal cholecystectomy -The procedure, material risks (including, but not limited to, pain, bleeding, infection, scarring, need for blood transfusion, damage to surrounding structures- blood vessels/nerves/viscus/organs, contraction of COVID-19, damage to bile duct, bile leak, need for additional procedures, worsening of pre-existing health conditions, hernia, pancreatitis, pneumonia, heart attack, stroke, death) benefits and alternatives to surgery were discussed at length. The patient and his wife's questions were answered to their satisfaction, they voiced understanding and after considering all options have chosen to ultimately proceed with surgery. Additionally, we discussed typical postoperative expectations and the recovery process. -We discussed the likely stay in the hospital overnight following his procedure -We also discussed the risk of exposure to COVID-19 in a healthcare environment and option to delay surgery until pandemic passes. They have opted to pursue surgery in light of this.  Signed electronically by Ileana Roup, MD (07/22/2018 9:32 AM)

## 2018-08-16 ENCOUNTER — Other Ambulatory Visit: Payer: Self-pay | Admitting: Internal Medicine

## 2018-08-20 ENCOUNTER — Telehealth: Payer: Medicare PPO | Admitting: Neurology

## 2018-08-21 NOTE — Progress Notes (Addendum)
Note to hold plavix x 5 to 7 days and aspirin x 3 days if needed and cardiac clearcne note [facility] pa 07-09-18 epic ekg 05-24-18 epic Echo 07-08-17 epic  1 view chest xray 05-22-18 epic

## 2018-08-21 NOTE — Patient Instructions (Addendum)
YOU NEED TO HAVE A COVID 19 TEST ON 08-23-2018 at 1100 am. THIS TEST MUST BE DONE BEFORE SURGERY, [address], [place] , [id number]. ONCE YOUR COVID TEST IS COMPLETED, PLEASE BEGIN THE QUARANTINE INSTRUCTIONS AS OUTLINED IN YOUR HANDOUT.                CRISTOFHER LIVECCHI    Your procedure is scheduled on: 08-27-2018   Report to [facility]  Entrance    Report to short stay at 6:30 AM   1 VISITOR IS ALLOWED TO WAIT IN WAITING ROOM  ONLY DAY OF YOUR SURGERY.    Call this number if you have problems the morning of surgery [phone]    Remember: Do not eat food or drink liquids :After Midnight.     Take these medicines the morning of surgery with A SIP OF WATER: Carbidopa Levodopa, Famotidine (Pepcid), and Esomeprazole (Pepicd)  BRUSH YOUR TEETH MORNING OF SURGERY AND RINSE YOUR MOUTH OUT, NO CHEWING GUM CANDY OR MINTS.                                You may not have any metal on your body including hair pins and              piercings     Do not wear jewelry, cologne, lotions, powders or deodorant                      Men may shave face and neck.   Do not bring valuables to the hospital. [place].  Contacts, dentures or bridgework may not be worn into surgery.  Leave suitcase in the car. After surgery it may be brought to your room.     _____________________________________________________________________             [facility] - Preparing for Surgery Before surgery, you can play an important role.  Because skin is not sterile, your skin needs to be as free of germs as possible.  You can reduce the number of germs on your skin by washing with CHG (chlorahexidine gluconate) soap before surgery.  CHG is an antiseptic cleaner which kills germs and bonds with the skin to continue killing germs even after washing. Please DO NOT use if you have an allergy to CHG or antibacterial soaps.  If your skin  becomes reddened/irritated stop using the CHG and inform your nurse when you arrive at Short Stay. Do not shave (including legs and underarms) for at least 48 hours prior to the first CHG shower.  You may shave your face/neck. Please follow these instructions carefully:  1.  Shower with CHG Soap the night before surgery and the  morning of Surgery.  2.  If you choose to wash your hair, wash your hair first as usual with your  normal  shampoo.  3.  After you shampoo, rinse your hair and body thoroughly to remove the  shampoo.                           4.  Use CHG as you would any other liquid soap.  You can apply chg directly  to the skin and wash  Gently with a scrungie or clean washcloth.  5.  Apply the CHG Soap to your body ONLY FROM THE NECK DOWN.   Do not use on face/ open                           Wound or open sores. Avoid contact with eyes, ears mouth and genitals (private parts).                       Wash face,  Genitals (private parts) with your normal soap.             6.  Wash thoroughly, paying special attention to the area where your surgery  will be performed.  7.  Thoroughly rinse your body with warm water from the neck down.  8.  DO NOT shower/wash with your normal soap after using and rinsing off  the CHG Soap.                9.  Pat yourself dry with a clean towel.            10.  Wear clean pajamas.            11.  Place clean sheets on your bed the night of your first shower and do not  sleep with pets. Day of Surgery : Do not apply any lotions/deodorants the morning of surgery.  Please wear clean clothes to the hospital/surgery center.  FAILURE TO FOLLOW THESE INSTRUCTIONS MAY RESULT IN THE CANCELLATION OF YOUR SURGERY PATIENT SIGNATURE_________________________________  NURSE SIGNATURE__________________________________  ________________________________________________________________________

## 2018-08-22 ENCOUNTER — Encounter (HOSPITAL_COMMUNITY)
Admission: RE | Admit: 2018-08-22 | Discharge: 2018-08-22 | Disposition: A | Discharge status: Home / Self Care | Payer: Medicare PPO | Source: Ambulatory Visit | Attending: Surgery | Admitting: Surgery

## 2018-08-22 ENCOUNTER — Other Ambulatory Visit: Payer: Self-pay

## 2018-08-22 ENCOUNTER — Encounter (HOSPITAL_COMMUNITY): Payer: Self-pay

## 2018-08-22 DIAGNOSIS — Z20828 Contact with and (suspected) exposure to other viral communicable diseases: Secondary | ICD-10-CM | POA: Diagnosis not present

## 2018-08-22 DIAGNOSIS — Z01818 Encounter for other preprocedural examination: Secondary | ICD-10-CM | POA: Diagnosis not present

## 2018-08-22 LAB — COMPREHENSIVE METABOLIC PANEL
ALT: 8 U/L (ref 0–44)
AST: 24 U/L (ref 15–41)
Albumin: 3.9 g/dL (ref 3.5–5.0)
Alkaline Phosphatase: 82 U/L (ref 38–126)
Anion gap: 7 (ref 5–15)
BUN: 12 mg/dL (ref 8–23)
CO2: 28 mmol/L (ref 22–32)
Calcium: 9.6 mg/dL (ref 8.9–10.3)
Chloride: 105 mmol/L (ref 98–111)
Creatinine, Ser: 0.96 mg/dL (ref 0.61–1.24)
GFR calc Af Amer: >60 mL/min (ref >60)
GFR calc non Af Amer: >60 mL/min (ref >60)
Glucose, Bld: 103 mg/dL — ABNORMAL HIGH (ref 70–99)
Potassium: 4.4 mmol/L (ref 3.5–5.1)
Sodium: 140 mmol/L (ref 135–145)
Total Bilirubin: 0.6 mg/dL (ref 0.3–1.2)
Total Protein: 7.7 g/dL (ref 6.5–8.1)

## 2018-08-22 LAB — CBC WITH DIFFERENTIAL/PLATELET
Abs Immature Granulocytes: 0.02 10*3/uL (ref 0.00–0.07)
Basophils Absolute: 0.1 10*3/uL (ref 0.0–0.1)
Basophils Relative: 1 %
Eosinophils Absolute: 0.3 10*3/uL (ref 0.0–0.5)
Eosinophils Relative: 4 %
HCT: 47.3 % (ref 39.0–52.0)
Hemoglobin: 15 g/dL (ref 13.0–17.0)
Immature Granulocytes: 0 %
Lymphocytes Relative: 15 %
Lymphs Abs: 1.3 10*3/uL (ref 0.7–4.0)
MCH: 29.9 pg (ref 26.0–34.0)
MCHC: 31.7 g/dL (ref 30.0–36.0)
MCV: 94.4 fL (ref 80.0–100.0)
Monocytes Absolute: 0.6 10*3/uL (ref 0.1–1.0)
Monocytes Relative: 7 %
Neutro Abs: 6.6 10*3/uL (ref 1.7–7.7)
Neutrophils Relative %: 73 %
Platelets: 255 10*3/uL (ref 150–400)
RBC: 5.01 MIL/uL (ref 4.22–5.81)
RDW: 13.7 % (ref 11.5–15.5)
WBC: 9 10*3/uL (ref 4.0–10.5)
nRBC: 0 % (ref 0.0–0.2)

## 2018-08-22 LAB — APTT: aPTT: 33 seconds (ref 24–36)

## 2018-08-22 LAB — PROTIME-INR
INR: 0.9 (ref 0.8–1.2)
Prothrombin Time: 11.8 seconds (ref 11.4–15.2)

## 2018-08-23 ENCOUNTER — Other Ambulatory Visit (HOSPITAL_COMMUNITY)
Admission: RE | Admit: 2018-08-23 | Discharge: 2018-08-23 | Disposition: A | Discharge status: Home / Self Care | Payer: Medicare PPO | Source: Ambulatory Visit | Attending: Surgery | Admitting: Surgery

## 2018-08-23 DIAGNOSIS — Z01818 Encounter for other preprocedural examination: Secondary | ICD-10-CM | POA: Diagnosis not present

## 2018-08-23 DIAGNOSIS — Z20828 Contact with and (suspected) exposure to other viral communicable diseases: Secondary | ICD-10-CM | POA: Diagnosis not present

## 2018-08-23 LAB — SARS CORONAVIRUS 2 (TAT 6-24 HRS): SARS Coronavirus 2: NEGATIVE

## 2018-08-25 ENCOUNTER — Encounter (HOSPITAL_COMMUNITY): Payer: Self-pay | Admitting: Physician Assistant

## 2018-08-25 ENCOUNTER — Encounter (HOSPITAL_COMMUNITY): Payer: Self-pay | Admitting: Anesthesiology

## 2018-08-25 NOTE — Progress Notes (Signed)
Anesthesia Chart Review   Case: [id number] Date/Time: 08/27/18 0815   Procedure: LAPAROSCOPIC CHOLECYSTECTOMY (N/A )   Anesthesia type: General   Pre-op diagnosis: CHRONIC CHOLECYSTITIS   Location: [facility] ROOM 01 / [facility]   Surgeon: Ileana Roup, MD      DISCUSSION:73 y.o. former smoker (quit 02/19/72) with h/o HTN, HLD, hiatal hernia, Parkinson's disease, CAD (CABG 2006), chronic cholecystitis scheduled for above procedure 08/27/2018 with Dr. Nadeen Landau.   Pt cleared by cardiology 07/09/2018.  Per Kerin Ransom, PA-C, "Pt contacted today and has been doing well from a cardiac standpoint. Given past medical history and time since last visit, based on ACC/AHA guidelines, [place] would be at acceptable risk for the planned procedure without further cardiovascular testing. Ok to hold Plavix 5-7 days pre op, ASA 3 days pre op if needed."  Anticipate pt can proceed with planned procedure barring acute status change.   VS: BP (!) 163/82   Pulse 65   Temp 37 C (Oral)   Resp 18   Ht 5\' 4"  (1.626 m)   Wt 64.2 kg   SpO2 99%   BMI 24.31 kg/m   PROVIDERS: Biagio Borg, MD is PCP   Minus Breeding, MD is Cardiologist  LABS: Labs reviewed: Acceptable for surgery. (all labs ordered are listed, but only abnormal results are displayed)  Labs Reviewed  COMPREHENSIVE METABOLIC PANEL - Abnormal; Notable for the following components:      Result Value   Glucose, Bld 103 (*)    All other components within normal limits  APTT  CBC WITH DIFFERENTIAL/PLATELET  PROTIME-INR     IMAGES:   EKG: 05/24/2018  Rate 56 bpm Sinus bradycardia Rightward axis Abnormal ECG   CV: Echo 07/09/17 Study Conclusions  - Left ventricle: The cavity size was normal. Wall thickness was   increased in a pattern of mild LVH. Systolic function was normal.   The estimated ejection fraction was in the range of 55% to 60%.   Wall motion was normal; there were no regional wall motion   abnormalities.  Features are consistent with a pseudonormal left   ventricular filling pattern, with concomitant abnormal relaxation   and increased filling pressure (grade 2 diastolic dysfunction). - Mitral valve: Mildly calcified annulus. There was moderate   regurgitation. - Left atrium: The atrium was severely dilated. - Right ventricle: The cavity size was mildly dilated. - Right atrium: The atrium was severely dilated. - Tricuspid valve: There was moderate regurgitation. - Pulmonary arteries: Systolic pressure was moderately increased.   PA peak pressure: 44 mm Hg (S). Past Medical History:  Diagnosis Date  . Anxiety state, unspecified   . Arthritis   . Benign neoplasm of colon   . CAD (coronary artery disease) of artery bypass graft 05/20/2018  . Coronary atherosclerosis of unspecified type of vessel, native or graft   . Depressive disorder, not elsewhere classified   . Diaphragmatic hernia without mention of obstruction or gangrene   . Diverticulosis of colon (without mention of hemorrhage)   . Elevated troponin 05/20/2018  . Epigastric pain 05/20/2018  . Esophageal reflux   . Esophageal stricture   . Hiatal hernia   . Mitral regurgitation   . Osteoarthrosis, unspecified whether generalized or localized, unspecified site   . Other and unspecified hyperlipidemia   . Parkinson's disease ([place])   . Personal history of colonic polyps   . Personal history of unspecified circulatory disease   . Unspecified essential hypertension  Past Surgical History:  Procedure Laterality Date  . COLONOSCOPY    . CORONARY ARTERY BYPASS GRAFT     x 2  . INGUINAL HERNIA REPAIR     bilateral  . IR PERC CHOLECYSTOSTOMY  05/22/2018  . IR RADIOLOGIST EVAL & MGMT  06/24/2018  . IR RADIOLOGIST EVAL & MGMT  07/01/2018  . LUNG BIOPSY    . POLYPECTOMY    . PTCA      MEDICATIONS: . aspirin 81 MG tablet  . B Complex Vitamins (VITAMIN B COMPLEX PO)  . carbidopa-levodopa (SINEMET IR) 25-100 MG tablet  .  clopidogrel (PLAVIX) 75 MG tablet  . esomeprazole (NEXIUM) 40 MG capsule  . famotidine (PEPCID) 20 MG tablet  . lovastatin (MEVACOR) 20 MG tablet  . Multiple Vitamin (MULTIVITAMIN WITH MINERALS) TABS tablet  . NITROSTAT 0.4 MG SL tablet  . tetrahydrozoline-zinc (VISINE-AC) 0.05-0.25 % ophthalmic solution  . tiZANidine (ZANAFLEX) 2 MG tablet  . traMADol (ULTRAM) 50 MG tablet   No current facility-administered medications for this encounter.      Maia Plan [facility] Pre-Surgical Testing [phone] 08/25/18  4:01 PM

## 2018-08-25 NOTE — Anesthesia Preprocedure Evaluation (Deleted)
Anesthesia Evaluation    Reviewed: Allergy & Precautions, Patient's Chart, lab work & pertinent test results  History of Anesthesia Complications Negative for: history of anesthetic complications  Airway        Dental   Pulmonary neg pulmonary ROS, former smoker,           Cardiovascular hypertension, + CAD and + CABG  + Valvular Problems/Murmurs (moderate) MR      Neuro/Psych PSYCHIATRIC DISORDERS Anxiety Depression Parkinson's    GI/Hepatic Neg liver ROS, hiatal hernia, GERD  ,  Endo/Other  negative endocrine ROS  Renal/GU negative Renal ROS  negative genitourinary   Musculoskeletal negative musculoskeletal ROS (+)   Abdominal   Peds  Hematology negative hematology ROS (+)   Anesthesia Other Findings Pt cleared by cardiology 07/09/2018.  Per Kerin Ransom, PA-C, "Pt contacted today and has been doing well from a cardiac standpoint. Given past medical history and time since last visit, based on ACC/AHA guidelines, [place] would be at acceptable risk for the planned procedure without further cardiovascular testing. Ok to hold Plavix 5-7 days pre op, ASA 3 days pre op if needed."  Reproductive/Obstetrics                            Anesthesia Physical Anesthesia Plan  ASA: III  Anesthesia Plan: General   Post-op Pain Management:    Induction: Intravenous  PONV Risk Score and Plan: 3 and Ondansetron, Dexamethasone and Treatment may vary due to age or medical condition  Airway Management Planned: Oral ETT  Additional Equipment: None  Intra-op Plan:   Post-operative Plan: Extubation in OR  Informed Consent:   Plan Discussed with:   Anesthesia Plan Comments: (See PAT note 08/22/2018, Konrad Felix, PA-C)       Anesthesia Quick Evaluation

## 2018-08-26 ENCOUNTER — Observation Stay (HOSPITAL_COMMUNITY)
Admission: EM | Admit: 2018-08-26 | Discharge: 2018-08-27 | Disposition: A | Discharge status: Home / Self Care | Payer: Medicare PPO | Attending: Family Medicine | Admitting: Family Medicine

## 2018-08-26 ENCOUNTER — Emergency Department (HOSPITAL_COMMUNITY): Payer: Medicare PPO

## 2018-08-26 ENCOUNTER — Other Ambulatory Visit: Payer: Self-pay

## 2018-08-26 ENCOUNTER — Observation Stay (HOSPITAL_COMMUNITY): Payer: Medicare PPO

## 2018-08-26 ENCOUNTER — Encounter (HOSPITAL_COMMUNITY): Payer: Self-pay

## 2018-08-26 DIAGNOSIS — Z79899 Other long term (current) drug therapy: Secondary | ICD-10-CM | POA: Insufficient documentation

## 2018-08-26 DIAGNOSIS — R200 Anesthesia of skin: Secondary | ICD-10-CM | POA: Diagnosis not present

## 2018-08-26 DIAGNOSIS — Z7982 Long term (current) use of aspirin: Secondary | ICD-10-CM | POA: Diagnosis not present

## 2018-08-26 DIAGNOSIS — Z20828 Contact with and (suspected) exposure to other viral communicable diseases: Secondary | ICD-10-CM | POA: Diagnosis not present

## 2018-08-26 DIAGNOSIS — Z951 Presence of aortocoronary bypass graft: Secondary | ICD-10-CM | POA: Diagnosis not present

## 2018-08-26 DIAGNOSIS — I340 Nonrheumatic mitral (valve) insufficiency: Secondary | ICD-10-CM | POA: Diagnosis present

## 2018-08-26 DIAGNOSIS — I10 Essential (primary) hypertension: Secondary | ICD-10-CM | POA: Insufficient documentation

## 2018-08-26 DIAGNOSIS — G459 Transient cerebral ischemic attack, unspecified: Principal | ICD-10-CM

## 2018-08-26 DIAGNOSIS — R531 Weakness: Secondary | ICD-10-CM | POA: Diagnosis present

## 2018-08-26 DIAGNOSIS — Z87891 Personal history of nicotine dependence: Secondary | ICD-10-CM | POA: Insufficient documentation

## 2018-08-26 DIAGNOSIS — I2510 Atherosclerotic heart disease of native coronary artery without angina pectoris: Secondary | ICD-10-CM | POA: Diagnosis not present

## 2018-08-26 DIAGNOSIS — I051 Rheumatic mitral insufficiency: Secondary | ICD-10-CM | POA: Diagnosis not present

## 2018-08-26 DIAGNOSIS — G20 Parkinson's disease: Secondary | ICD-10-CM | POA: Insufficient documentation

## 2018-08-26 DIAGNOSIS — G20A1 Parkinson's disease without dyskinesia, without mention of fluctuations: Secondary | ICD-10-CM

## 2018-08-26 DIAGNOSIS — I25810 Atherosclerosis of coronary artery bypass graft(s) without angina pectoris: Secondary | ICD-10-CM | POA: Diagnosis present

## 2018-08-26 DIAGNOSIS — Z020 Encounter for examination for admission to educational institution: Secondary | ICD-10-CM | POA: Diagnosis not present

## 2018-08-26 DIAGNOSIS — I639 Cerebral infarction, unspecified: Secondary | ICD-10-CM

## 2018-08-26 LAB — DIFFERENTIAL
Abs Immature Granulocytes: 0.02 10*3/uL (ref 0.00–0.07)
Basophils Absolute: 0.1 10*3/uL (ref 0.0–0.1)
Basophils Relative: 1 %
Eosinophils Absolute: 0.3 10*3/uL (ref 0.0–0.5)
Eosinophils Relative: 4 %
Immature Granulocytes: 0 %
Lymphocytes Relative: 16 %
Lymphs Abs: 1.2 10*3/uL (ref 0.7–4.0)
Monocytes Absolute: 0.7 10*3/uL (ref 0.1–1.0)
Monocytes Relative: 9 %
Neutro Abs: 5.3 10*3/uL (ref 1.7–7.7)
Neutrophils Relative %: 70 %

## 2018-08-26 LAB — COMPREHENSIVE METABOLIC PANEL
ALT: 6 U/L (ref 0–44)
AST: 26 U/L (ref 15–41)
Albumin: 3.7 g/dL (ref 3.5–5.0)
Alkaline Phosphatase: 75 U/L (ref 38–126)
Anion gap: 10 (ref 5–15)
BUN: 14 mg/dL (ref 8–23)
CO2: 23 mmol/L (ref 22–32)
Calcium: 9.3 mg/dL (ref 8.9–10.3)
Chloride: 107 mmol/L (ref 98–111)
Creatinine, Ser: 0.99 mg/dL (ref 0.61–1.24)
GFR calc Af Amer: >60 mL/min (ref >60)
GFR calc non Af Amer: >60 mL/min (ref >60)
Glucose, Bld: 117 mg/dL — ABNORMAL HIGH (ref 70–99)
Potassium: 4.1 mmol/L (ref 3.5–5.1)
Sodium: 140 mmol/L (ref 135–145)
Total Bilirubin: 0.4 mg/dL (ref 0.3–1.2)
Total Protein: 7.2 g/dL (ref 6.5–8.1)

## 2018-08-26 LAB — URINALYSIS, ROUTINE W REFLEX MICROSCOPIC
Bilirubin Urine: NEGATIVE
Glucose, UA: NEGATIVE mg/dL
Hgb urine dipstick: NEGATIVE
Ketones, ur: NEGATIVE mg/dL
Leukocytes,Ua: NEGATIVE
Nitrite: NEGATIVE
Protein, ur: NEGATIVE mg/dL
Specific Gravity, Urine: 1.011 (ref 1.005–1.030)
pH: 6 (ref 5.0–8.0)

## 2018-08-26 LAB — CBC
HCT: 44.4 % (ref 39.0–52.0)
Hemoglobin: 14.7 g/dL (ref 13.0–17.0)
MCH: 30.8 pg (ref 26.0–34.0)
MCHC: 33.1 g/dL (ref 30.0–36.0)
MCV: 92.9 fL (ref 80.0–100.0)
Platelets: 259 10*3/uL (ref 150–400)
RBC: 4.78 MIL/uL (ref 4.22–5.81)
RDW: 13.6 % (ref 11.5–15.5)
WBC: 7.6 10*3/uL (ref 4.0–10.5)
nRBC: 0 % (ref 0.0–0.2)

## 2018-08-26 LAB — I-STAT CHEM 8, ED
BUN: 14 mg/dL (ref 8–23)
Calcium, Ion: 1.24 mmol/L (ref 1.15–1.40)
Chloride: 104 mmol/L (ref 98–111)
Creatinine, Ser: 1 mg/dL (ref 0.61–1.24)
Glucose, Bld: 112 mg/dL — ABNORMAL HIGH (ref 70–99)
HCT: 45 % (ref 39.0–52.0)
Hemoglobin: 15.3 g/dL (ref 13.0–17.0)
Potassium: 4 mmol/L (ref 3.5–5.1)
Sodium: 140 mmol/L (ref 135–145)
TCO2: 26 mmol/L (ref 22–32)

## 2018-08-26 LAB — APTT: aPTT: 34 seconds (ref 24–36)

## 2018-08-26 LAB — ETHANOL: Alcohol, Ethyl (B): <10 mg/dL (ref <10)

## 2018-08-26 LAB — RAPID URINE DRUG SCREEN, HOSP PERFORMED
Amphetamines: NOT DETECTED
Barbiturates: NOT DETECTED
Benzodiazepines: NOT DETECTED
Cocaine: NOT DETECTED
Opiates: NOT DETECTED
Tetrahydrocannabinol: NOT DETECTED

## 2018-08-26 LAB — CBG MONITORING, ED: Glucose-Capillary: 121 mg/dL — ABNORMAL HIGH (ref 70–99)

## 2018-08-26 LAB — PROTIME-INR
INR: 0.9 (ref 0.8–1.2)
Prothrombin Time: 11.7 seconds (ref 11.4–15.2)

## 2018-08-26 MED ORDER — ASPIRIN 300 MG RE SUPP: 300.0000 mg | Freq: Every day | RECTAL | Status: DC

## 2018-08-26 MED ORDER — STROKE: EARLY STAGES OF RECOVERY BOOK
Freq: Once | Status: AC
  Administered 2018-08-27: 02:00:00
  Filled 2018-08-26 (×2): qty 1

## 2018-08-26 MED ORDER — ACETAMINOPHEN 650 MG RE SUPP: 650.0000 mg | RECTAL | Status: DC | PRN

## 2018-08-26 MED ORDER — ACETAMINOPHEN 325 MG PO TABS: 650.0000 mg | ORAL_TABLET | ORAL | Status: DC | PRN

## 2018-08-26 MED ORDER — ASPIRIN 325 MG PO TABS
325.0000 mg | ORAL_TABLET | Freq: Every day | ORAL | Status: DC
  Administered 2018-08-27: 325 mg via ORAL
  Filled 2018-08-26: qty 1

## 2018-08-26 MED ORDER — ACETAMINOPHEN 160 MG/5ML PO SOLN: 650.0000 mg | ORAL | Status: DC | PRN

## 2018-08-26 MED ORDER — ASPIRIN 81 MG PO CHEW
324.0000 mg | CHEWABLE_TABLET | Freq: Once | ORAL | Status: AC
  Administered 2018-08-27: 01:00:00 324 mg via ORAL
  Filled 2018-08-26: qty 4

## 2018-08-26 NOTE — ED Triage Notes (Addendum)
Pt coming from home c/o right arm numbness and facial numbness that started around 7:15pm. Pt endorsed increased weakness as well as slurred speech. Pt stated symptoms have resolved. Hx of stroke

## 2018-08-26 NOTE — ED Provider Notes (Signed)
[facility] DEPT Provider Note   CSN: [id number] Arrival date & time: 08/26/18  2039     History   Chief Complaint Chief Complaint  Patient presents with  . Numbness    HPI Brandon Rojas is a 73 y.o. male.     73 year old male with prior medical history as detailed below presents for evaluation of transient weakness of the right arm.  Patient reports that at 715 he had weakness and tingling of the right arm associated with numbness of the right face.  This was also associated with mild slurring of his speech.  Symptoms lasted approximately 10 minutes.  Upon arrival to the ED is now back to baseline.  Patient reports that he has been holding his aspirin and Plavix for a planned cholecystectomy tomorrow morning.  Patient does report a distant prior history of TIA.  The history is provided by the patient, a relative and medical records.  Illness Location:  Right arm weakness, slurred speech, right facial numbness Severity:  Mild Onset quality:  Sudden Duration:  10 minutes Timing:  Constant Progression:  Resolved Chronicity:  New   Past Medical History:  Diagnosis Date  . Anxiety state, unspecified   . Arthritis   . Benign neoplasm of colon   . CAD (coronary artery disease) of artery bypass graft 05/20/2018  . Coronary atherosclerosis of unspecified type of vessel, native or graft   . Depressive disorder, not elsewhere classified   . Diaphragmatic hernia without mention of obstruction or gangrene   . Diverticulosis of colon (without mention of hemorrhage)   . Elevated troponin 05/20/2018  . Epigastric pain 05/20/2018  . Esophageal reflux   . Esophageal stricture   . Hiatal hernia   . Mitral regurgitation   . Osteoarthrosis, unspecified whether generalized or localized, unspecified site   . Other and unspecified hyperlipidemia   . Parkinson's disease ([place])   . Personal history of colonic polyps   . Personal history of unspecified  circulatory disease   . Unspecified essential hypertension     Patient Active Problem List   Diagnosis Date Noted  . TIA (transient ischemic attack) 08/26/2018  . Nocturia 05/30/2018  . Elevated troponin 05/20/2018  . Epigastric pain 05/20/2018  . CAD (coronary artery disease) of artery bypass graft 05/20/2018  . Acute cholecystitis 05/20/2018  . Subacute left lumbar radiculopathy 06/23/2015  . Mitral regurgitation 09/17/2014  . Parkinson's disease ([place]) 08/19/2014  . Pulmonary hypertension ([place]) 03/30/2014  . Piriformis syndrome of left side 03/02/2014  . Peripheral edema 01/15/2014  . Movement disorder 01/15/2014  . Hypercalcemia 01/15/2014  . Left hip pain 01/15/2014  . Hoarseness 01/15/2014  . Back pain 06/08/2010  . Encounter for long-term (current) use of high-risk medication 06/08/2010  . ED (erectile dysfunction) 06/08/2010  . Preventative health care 06/07/2010  . SHOULDER PAIN, LEFT 08/30/2009  . CAD, AUTOLOGOUS BYPASS GRAFT 03/31/2009  . CAROTID ARTERY DISEASE 01/25/2009  . BRADYCARDIA 10/08/2007  . COLONIC POLYPS 03/20/2007  . HIATAL HERNIA 03/20/2007  . DIVERTICULOSIS, COLON 03/20/2007  . Hyperlipidemia 09/19/2006  . OBESITY 09/19/2006  . Anxiety state 09/19/2006  . ERECTILE DYSFUNCTION 09/19/2006  . Depression 09/19/2006  . Essential hypertension 09/19/2006  . Coronary atherosclerosis 09/19/2006  . ALLERGIC RHINITIS 09/19/2006  . GERD 09/19/2006  . OSTEOARTHRITIS 09/19/2006  . TRANSIENT ISCHEMIC ATTACK, HX OF 09/19/2006  . PSORIASIS, HX OF 09/19/2006    Past Surgical History:  Procedure Laterality Date  . COLONOSCOPY    . CORONARY ARTERY BYPASS  GRAFT     x 2  . INGUINAL HERNIA REPAIR     bilateral  . IR PERC CHOLECYSTOSTOMY  05/22/2018  . IR RADIOLOGIST EVAL & MGMT  06/24/2018  . IR RADIOLOGIST EVAL & MGMT  07/01/2018  . LUNG BIOPSY    . POLYPECTOMY    . PTCA          Home Medications    Prior to Admission medications   Medication Sig  Start Date End Date Taking? Authorizing Provider  B Complex Vitamins (VITAMIN B COMPLEX PO) Take 1 tablet by mouth every other day.    Yes [provider]  carbidopa-levodopa (SINEMET IR) 25-100 MG tablet Take 1 tablet by mouth 3 (three) times daily. 05/06/18  Yes Tat, Eustace Quail, DO  esomeprazole (NEXIUM) 40 MG capsule TAKE 1 CAPSULE BY MOUTH ONCE DAILY Patient taking differently: Take 40 mg by mouth daily.  08/18/18  Yes Biagio Borg, MD  famotidine (PEPCID) 20 MG tablet Take 1 tablet (20 mg total) by mouth 2 (two) times daily. Patient taking differently: Take 20 mg by mouth 2 (two) times daily as needed for heartburn.  05/30/18  Yes Biagio Borg, MD  lovastatin (MEVACOR) 20 MG tablet TAKE 1 TABLET AT BEDTIME Patient taking differently: Take 20 mg by mouth at bedtime.  04/29/18  Yes Biagio Borg, MD  Multiple Vitamin (MULTIVITAMIN WITH MINERALS) TABS tablet Take 1 tablet by mouth daily.   Yes [provider]  NITROSTAT 0.4 MG SL tablet PLACE 1 TABLET (0.4 MG TOTAL) UNDER THE TONGUE EVERY 5 (FIVE) MINUTES AS NEEDED. Patient taking differently: Place 0.4 mg under the tongue every 5 (five) minutes as needed for chest pain.  09/21/14  Yes Minus Breeding, MD  tetrahydrozoline-zinc (VISINE-AC) 0.05-0.25 % ophthalmic solution Place 2 drops into both eyes 3 (three) times daily as needed (for burning or eye redness).   Yes [provider]  tiZANidine (ZANAFLEX) 2 MG tablet Take 1 tablet (2 mg total) by mouth every 6 (six) hours as needed for muscle spasms. 11/12/17  Yes Biagio Borg, MD  traMADol (ULTRAM) 50 MG tablet Take 1 tablet (50 mg total) by mouth every 6 (six) hours as needed. for pain Patient taking differently: Take 50 mg by mouth every 6 (six) hours as needed (for pain).  05/13/18  Yes Biagio Borg, MD  aspirin 81 MG tablet Take 81 mg by mouth daily.      [provider]  clopidogrel (PLAVIX) 75 MG tablet TAKE 1 TABLET EVERY DAY WITH BREAKFAST Patient taking  differently: Take 75 mg by mouth daily.  04/29/18   Biagio Borg, MD    Family History Family History  Problem Relation Age of Onset  . Heart attack Mother   . Stroke Father   . Colon cancer Sister   . Healthy Sister   . Heart disease Brother   . Healthy Brother   . Healthy Brother   . Healthy Brother   . Healthy Daughter   . Fibromyalgia Daughter   . Multiple sclerosis Son     Social History Social History   Tobacco Use  . Smoking status: Former Smoker    Quit date: 02/19/1972    Years since quitting: 46.5  . Smokeless tobacco: Never Used  Substance Use Topics  . Alcohol use: No    Alcohol/week: 0.0 standard drinks  . Drug use: No     Allergies   Atorvastatin and Simvastatin   Review of Systems Review  of Systems  All other systems reviewed and are negative.    Physical Exam Updated Vital Signs There were no vitals taken for this visit.  Physical Exam Vitals signs and nursing note reviewed.  Constitutional:      General: He is not in acute distress.    Appearance: He is well-developed.  HENT:     Head: Normocephalic and atraumatic.  Eyes:     Conjunctiva/sclera: Conjunctivae normal.     Pupils: Pupils are equal, round, and reactive to light.  Neck:     Musculoskeletal: Normal range of motion and neck supple.  Cardiovascular:     Rate and Rhythm: Normal rate and regular rhythm.     Heart sounds: Normal heart sounds.  Pulmonary:     Effort: Pulmonary effort is normal. No respiratory distress.     Breath sounds: Normal breath sounds.  Abdominal:     General: There is no distension.     Palpations: Abdomen is soft.     Tenderness: There is no abdominal tenderness.  Musculoskeletal: Normal range of motion.        General: No deformity.  Skin:    General: Skin is warm and dry.  Neurological:     General: No focal deficit present.     Mental Status: He is alert and oriented to person, place, and time. Mental status is at baseline.     Cranial  Nerves: No cranial nerve deficit.     Sensory: No sensory deficit.     Motor: No weakness.     Coordination: Coordination normal.      ED Treatments / Results  Labs (all labs ordered are listed, but only abnormal results are displayed) Labs Reviewed  COMPREHENSIVE METABOLIC PANEL - Abnormal; Notable for the following components:      Result Value   Glucose, Bld 117 (*)    All other components within normal limits  CBG MONITORING, ED - Abnormal; Notable for the following components:   Glucose-Capillary 121 (*)    All other components within normal limits  I-STAT CHEM 8, ED - Abnormal; Notable for the following components:   Glucose, Bld 112 (*)    All other components within normal limits  ETHANOL  PROTIME-INR  APTT  CBC  DIFFERENTIAL  URINALYSIS, ROUTINE W REFLEX MICROSCOPIC  RAPID URINE DRUG SCREEN, HOSP PERFORMED  HEMOGLOBIN A1C  LIPID PANEL  CBC  COMPREHENSIVE METABOLIC PANEL    EKG None  Radiology Ct Head Wo Contrast  Result Date: 08/26/2018 CLINICAL DATA:  Onset right arm and facial numbness at 7:15 p.m. today. EXAM: CT HEAD WITHOUT CONTRAST TECHNIQUE: Contiguous axial images were obtained from the base of the skull through the vertex without intravenous contrast. COMPARISON:  None. FINDINGS: Brain: No evidence of acute infarction, hemorrhage, hydrocephalus, extra-axial collection or mass lesion/mass effect. Chronic microvascular ischemic change is noted. Vascular: Atherosclerosis is identified. Skull: Intact.  No focal lesion. Sinuses/Orbits: Negative. Other: None. IMPRESSION: No acute abnormality. Chronic microvascular ischemic change. Atherosclerosis. Electronically Signed   By: Inge Rise M.D.   On: 08/26/2018 21:27    Procedures Procedures (including critical care time)  Medications Ordered in ED Medications   stroke: mapping our early stages of recovery book (has no administration in time range)  acetaminophen (TYLENOL) tablet 650 mg (has no  administration in time range)    Or  acetaminophen (TYLENOL) solution 650 mg (has no administration in time range)    Or  acetaminophen (TYLENOL) suppository 650 mg (has no administration in time  range)  aspirin suppository 300 mg (has no administration in time range)    Or  aspirin tablet 325 mg (has no administration in time range)  aspirin chewable tablet 324 mg (has no administration in time range)     Initial Impression / Assessment and Plan / ED Course  I have reviewed the triage vital signs and the nursing notes.  Pertinent labs & imaging results that were available during my care of the patient were reviewed by me and considered in my medical decision making (see chart for details).        MDM  Screen complete  OSHAY STRANAHAN was evaluated in Emergency Department on 08/26/2018 for the symptoms described in the history of present illness. He was evaluated in the context of the global COVID-19 pandemic, which necessitated consideration that the patient might be at risk for infection with the SARS-CoV-2 virus that causes COVID-19. Institutional protocols and algorithms that pertain to the evaluation of patients at risk for COVID-19 are in a state of rapid change based on information released by regulatory bodies including the CDC and federal and state organizations. These policies and algorithms were followed during the patient's care in the ED.  Patient is presenting for evaluation of reported symptoms consistent with likely TIA.  Patient has been off his aspirin and Plavix for pending operative intervention tomorrow.  Symptoms are completely resolved.  Patient does not meet criteria for TPA at this time.  Case reviewed with Dr. Leonel Ramsay of neuro. He requests transfer to [facility].  He agrees with plan of care.  Hospitalist service Maudie Mercury) is aware of case and will evaluate for transfer.  Final Clinical Impressions(s) / ED Diagnoses   Final diagnoses:  TIA (transient ischemic  attack)    ED Discharge Orders    None       Valarie Merino, MD 08/26/18 2311

## 2018-08-26 NOTE — ED Notes (Signed)
Urine specimen requested 

## 2018-08-26 NOTE — ED Notes (Signed)
ED TO INPATIENT HANDOFF REPORT  ED Nurse Name and Phone #: Fredonia Highland [phone]  S Name/Age/Gender Brandon Rojas 73 y.o. male Room/Bed: RESB/RESB  Code Status   Code Status: Full Code  Home/SNF/Other Home Patient oriented to: self, place, time and situation Is this baseline? Yes   Triage Complete: Triage complete  Chief Complaint Possible TIA  Triage Note Pt coming from home c/o right arm numbness and facial numbness that started around 7:15pm. Pt endorsed increased weakness as well as slurred speech. Pt stated symptoms have resolved. Hx of stroke    Allergies Allergies  Allergen Reactions  . Atorvastatin Other (See Comments)    Muscle cramps  . Simvastatin Other (See Comments)    Muscle cramps    Level of Care/Admitting Diagnosis ED Disposition    ED Disposition Condition Comment   Admit  Hospital Area: [facility] [[id number]]  Level of Care: Telemetry Medical [104]  I expect the patient will be discharged within 24 hours: No (not a candidate for 5C-Observation unit)  Covid Evaluation: Asymptomatic Screening Protocol (No Symptoms)  Diagnosis: TIA (transient ischemic attack) [102725]  Admitting Physician: Jani Gravel [[id number]]  Attending Physician: Jani Gravel [[id number]]  PT Class (Do Not Modify): Observation [104]  PT Acc Code (Do Not Modify): Observation [10022]       B Medical/Surgery History Past Medical History:  Diagnosis Date  . Anxiety state, unspecified   . Arthritis   . Benign neoplasm of colon   . CAD (coronary artery disease) of artery bypass graft 05/20/2018  . Coronary atherosclerosis of unspecified type of vessel, native or graft   . Depressive disorder, not elsewhere classified   . Diaphragmatic hernia without mention of obstruction or gangrene   . Diverticulosis of colon (without mention of hemorrhage)   . Elevated troponin 05/20/2018  . Epigastric pain 05/20/2018  . Esophageal reflux   . Esophageal stricture   . Hiatal hernia   .  Mitral regurgitation   . Osteoarthrosis, unspecified whether generalized or localized, unspecified site   . Other and unspecified hyperlipidemia   . Parkinson's disease ([place])   . Personal history of colonic polyps   . Personal history of unspecified circulatory disease   . Unspecified essential hypertension    Past Surgical History:  Procedure Laterality Date  . COLONOSCOPY    . CORONARY ARTERY BYPASS GRAFT     x 2  . INGUINAL HERNIA REPAIR     bilateral  . IR PERC CHOLECYSTOSTOMY  05/22/2018  . IR RADIOLOGIST EVAL & MGMT  06/24/2018  . IR RADIOLOGIST EVAL & MGMT  07/01/2018  . LUNG BIOPSY    . POLYPECTOMY    . PTCA       A IV Location/Drains/Wounds Patient Lines/Drains/Airways Status   Active Line/Drains/Airways    Name:   Placement date:   Placement time:   Site:   Days:   Peripheral IV 08/26/18 Right Antecubital   08/26/18    2050    Antecubital   less than 1   Closed System Drain 1 Right;Anterior;Lateral RUQ 10 Fr.   05/22/18    1630    RUQ   96   External Urinary Catheter   05/21/18    1503    -   97          Intake/Output Last 24 hours No intake or output data in the 24 hours ending 08/26/18 2351  Labs/Imaging Results for orders placed or performed during the hospital encounter of 08/26/18 (from  the past 48 hour(s))  CBG monitoring, ED     Status: Abnormal   Collection Time: 08/26/18  8:54 PM  Result Value Ref Range   Glucose-Capillary 121 (H) 70 - 99 mg/dL  Ethanol     Status: None   Collection Time: 08/26/18  9:03 PM  Result Value Ref Range   Alcohol, Ethyl (B) <10 <10 mg/dL    Comment: (NOTE) Lowest detectable limit for serum alcohol is 10 mg/dL. For medical purposes only. Performed at [facility], [place] [address], [place], [place] [address]   Protime-INR     Status: None   Collection Time: 08/26/18  9:03 PM  Result Value Ref Range   Prothrombin Time 11.7 11.4 - 15.2 seconds   INR 0.9 0.8 - 1.2    Comment: (NOTE) INR goal varies  based on device and disease states. Performed at [facility], [place] [address] [place], [place] [address]   APTT     Status: None   Collection Time: 08/26/18  9:03 PM  Result Value Ref Range   aPTT 34 24 - 36 seconds    Comment: Performed at [facility], [place] [address], [place], [place] [address]  CBC     Status: None   Collection Time: 08/26/18  9:03 PM  Result Value Ref Range   WBC 7.6 4.0 - 10.5 K/uL   RBC 4.78 4.22 - 5.81 MIL/uL   Hemoglobin 14.7 13.0 - 17.0 g/dL   HCT 44.4 39.0 - 52.0 %   MCV 92.9 80.0 - 100.0 fL   MCH 30.8 26.0 - 34.0 pg   MCHC 33.1 30.0 - 36.0 g/dL   RDW 13.6 11.5 - 15.5 %   Platelets 259 150 - 400 K/uL   nRBC 0.0 0.0 - 0.2 %    Comment: Performed at [facility], [place] [address], [place], [place] [address]  Differential     Status: None   Collection Time: 08/26/18  9:03 PM  Result Value Ref Range   Neutrophils Relative % 70 %   Neutro Abs 5.3 1.7 - 7.7 K/uL   Lymphocytes Relative 16 %   Lymphs Abs 1.2 0.7 - 4.0 K/uL   Monocytes Relative 9 %   Monocytes Absolute 0.7 0.1 - 1.0 K/uL   Eosinophils Relative 4 %   Eosinophils Absolute 0.3 0.0 - 0.5 K/uL   Basophils Relative 1 %   Basophils Absolute 0.1 0.0 - 0.1 K/uL   Immature Granulocytes 0 %   Abs Immature Granulocytes 0.02 0.00 - 0.07 K/uL    Comment: Performed at [facility], [place] [address], [place],  [address]  Comprehensive metabolic panel     Status: Abnormal   Collection Time: 08/26/18  9:03 PM  Result Value Ref Range   Sodium 140 135 - 145 mmol/L   Potassium 4.1 3.5 - 5.1 mmol/L   Chloride 107 98 - 111 mmol/L   CO2 23 22 - 32 mmol/L   Glucose, Bld 117 (H) 70 - 99 mg/dL   BUN 14 8 - 23 mg/dL   Creatinine, Ser 0.99 0.61 - 1.24 mg/dL   Calcium 9.3 8.9 - 10.3 mg/dL   Total Protein 7.2 6.5 - 8.1 g/dL   Albumin 3.7 3.5 - 5.0 g/dL   AST 26 15 - 41 U/L   ALT 6 0 - 44 U/L   Alkaline Phosphatase 75 38 - 126 U/L    Total Bilirubin 0.4 0.3 - 1.2 mg/dL  GFR calc non Af Amer >60 >60 mL/min   GFR calc Af Amer >60 >60 mL/min   Anion gap 10 5 - 15    Comment: Performed at [facility], [place] [address], [place], [place] [address]  Urine rapid drug screen (hosp performed)     Status: None   Collection Time: 08/26/18  9:03 PM  Result Value Ref Range   Opiates NONE DETECTED NONE DETECTED   Cocaine NONE DETECTED NONE DETECTED   Benzodiazepines NONE DETECTED NONE DETECTED   Amphetamines NONE DETECTED NONE DETECTED   Tetrahydrocannabinol NONE DETECTED NONE DETECTED   Barbiturates NONE DETECTED NONE DETECTED    Comment: (NOTE) DRUG SCREEN FOR MEDICAL PURPOSES ONLY.  IF CONFIRMATION IS NEEDED FOR ANY PURPOSE, NOTIFY LAB WITHIN 5 DAYS. LOWEST DETECTABLE LIMITS FOR URINE DRUG SCREEN Drug Class                     Cutoff (ng/mL) Amphetamine and metabolites    1000 Barbiturate and metabolites    200 Benzodiazepine                 818 Tricyclics and metabolites     300 Opiates and metabolites        300 Cocaine and metabolites        300 THC                            50 Performed at [facility], [place] [address], [place], [place] [address]   Urinalysis, Routine w reflex microscopic     Status: None   Collection Time: 08/26/18  9:03 PM  Result Value Ref Range   Color, Urine YELLOW YELLOW   APPearance CLEAR CLEAR   Specific Gravity, Urine 1.011 1.005 - 1.030   pH 6.0 5.0 - 8.0   Glucose, UA NEGATIVE NEGATIVE mg/dL   Hgb urine dipstick NEGATIVE NEGATIVE   Bilirubin Urine NEGATIVE NEGATIVE   Ketones, ur NEGATIVE NEGATIVE mg/dL   Protein, ur NEGATIVE NEGATIVE mg/dL   Nitrite NEGATIVE NEGATIVE   Leukocytes,Ua NEGATIVE NEGATIVE    Comment: Performed at [place] [address] [place], [place] [address]  I-stat chem 8, ED     Status: Abnormal   Collection Time: 08/26/18  9:10 PM  Result Value Ref Range   Sodium 140 135 - 145 mmol/L    Potassium 4.0 3.5 - 5.1 mmol/L   Chloride 104 98 - 111 mmol/L   BUN 14 8 - 23 mg/dL   Creatinine, Ser 1.00 0.61 - 1.24 mg/dL   Glucose, Bld 112 (H) 70 - 99 mg/dL   Calcium, Ion 1.24 1.15 - 1.40 mmol/L   TCO2 26 22 - 32 mmol/L   Hemoglobin 15.3 13.0 - 17.0 g/dL   HCT 45.0 39.0 - 52.0 %   Ct Head Wo Contrast  Result Date: 08/26/2018 CLINICAL DATA:  Onset right arm and facial numbness at 7:15 p.m. today. EXAM: CT HEAD WITHOUT CONTRAST TECHNIQUE: Contiguous axial images were obtained from the base of the skull through the vertex without intravenous contrast. COMPARISON:  None. FINDINGS: Brain: No evidence of acute infarction, hemorrhage, hydrocephalus, extra-axial collection or mass lesion/mass effect. Chronic microvascular ischemic change is noted. Vascular: Atherosclerosis is identified. Skull: Intact.  No focal lesion. Sinuses/Orbits: Negative. Other: None. IMPRESSION: No acute abnormality. Chronic microvascular ischemic change. Atherosclerosis. Electronically Signed   By: Inge Rise M.D.   On: 08/26/2018 21:27  Pending Labs Unresulted Labs (From admission, onward)    Start     Ordered   08/27/18 0500  Hemoglobin A1c  Tomorrow morning,   R     08/26/18 2253   08/27/18 0500  Lipid panel  Tomorrow morning,   R    Comments: Fasting    08/26/18 2253   08/27/18 0500  CBC  Tomorrow morning,   R     08/26/18 2253   08/27/18 0500  Comprehensive metabolic panel  Tomorrow morning,   R     08/26/18 2253   08/26/18 2334  SARS Coronavirus 2 [facility] order, Performed in [facility] hospital lab) Nasopharyngeal Nasopharyngeal Swab  (Symptomatic/High Risk of Exposure/Tier 1 Patients Labs with Precautions)  Once,   STAT    Question Answer Comment  Is this test for diagnosis or screening Screening   Symptomatic for COVID-19 as defined by CDC No   Hospitalized for COVID-19 No   Admitted to ICU for COVID-19 No   Previously tested for COVID-19 Yes   Resident in a congregate (group) care setting  No   Employed in healthcare setting No      08/26/18 2334          Vitals/Pain Today's Vitals   08/26/18 2245 08/26/18 2300 08/26/18 2330 08/26/18 2348  BP: (!) 161/78 (!) 159/80 (!) 154/90   Pulse: 61 (!) 55 (!) 59   Resp: 17 11 12    Temp:    97.9 F (36.6 C)  TempSrc:    Oral  SpO2: 95% 96% 94%   PainSc:        Isolation Precautions No active isolations  Medications Medications   stroke: mapping our early stages of recovery book (has no administration in time range)  acetaminophen (TYLENOL) tablet 650 mg (has no administration in time range)    Or  acetaminophen (TYLENOL) solution 650 mg (has no administration in time range)    Or  acetaminophen (TYLENOL) suppository 650 mg (has no administration in time range)  aspirin suppository 300 mg (has no administration in time range)    Or  aspirin tablet 325 mg (has no administration in time range)  aspirin chewable tablet 324 mg (has no administration in time range)    Mobility walks Moderate fall risk   Focused Assessments Cardiac Assessment Handoff:    Lab Results  Component Value Date   CKTOTAL 83 01/15/2014   TROPONINI 0.05 (HH) 05/21/2018   Lab Results  Component Value Date   DDIMER  03/08/2010    <0.22        AT THE INHOUSE ESTABLISHED CUTOFF VALUE OF 0.48 ug/mL FEU, THIS ASSAY HAS BEEN DOCUMENTED IN THE LITERATURE TO HAVE A SENSITIVITY AND NEGATIVE PREDICTIVE VALUE OF AT LEAST 98 TO 99%.  THE TEST RESULT SHOULD BE CORRELATED WITH AN ASSESSMENT OF THE CLINICAL PROBABILITY OF DVT / VTE.   Does the Patient currently have chest pain? No     R Recommendations: See Admitting Provider Note  Report given to:   Additional Notes:

## 2018-08-26 NOTE — ED Notes (Signed)
[facility] is picking up [facility] to [facility]. 

## 2018-08-27 ENCOUNTER — Encounter (HOSPITAL_COMMUNITY): Payer: Self-pay | Admitting: Certified Registered"

## 2018-08-27 ENCOUNTER — Observation Stay (HOSPITAL_COMMUNITY): Payer: Medicare PPO

## 2018-08-27 ENCOUNTER — Observation Stay (HOSPITAL_BASED_OUTPATIENT_CLINIC_OR_DEPARTMENT_OTHER): Payer: Medicare PPO

## 2018-08-27 ENCOUNTER — Ambulatory Visit (HOSPITAL_BASED_OUTPATIENT_CLINIC_OR_DEPARTMENT_OTHER): Payer: Medicare PPO

## 2018-08-27 ENCOUNTER — Ambulatory Visit (HOSPITAL_COMMUNITY): Admission: RE | Admit: 2018-08-27 | Payer: Medicare PPO | Source: Home / Self Care | Admitting: Surgery

## 2018-08-27 ENCOUNTER — Encounter (HOSPITAL_COMMUNITY)
Admission: EM | Disposition: A | Discharge status: Home / Self Care | Payer: Self-pay | Source: Home / Self Care | Attending: Emergency Medicine

## 2018-08-27 DIAGNOSIS — Z87891 Personal history of nicotine dependence: Secondary | ICD-10-CM | POA: Diagnosis not present

## 2018-08-27 DIAGNOSIS — R531 Weakness: Secondary | ICD-10-CM | POA: Diagnosis present

## 2018-08-27 DIAGNOSIS — I6782 Cerebral ischemia: Secondary | ICD-10-CM | POA: Diagnosis not present

## 2018-08-27 DIAGNOSIS — G459 Transient cerebral ischemic attack, unspecified: Secondary | ICD-10-CM

## 2018-08-27 DIAGNOSIS — M4802 Spinal stenosis, cervical region: Secondary | ICD-10-CM | POA: Diagnosis not present

## 2018-08-27 DIAGNOSIS — M47812 Spondylosis without myelopathy or radiculopathy, cervical region: Secondary | ICD-10-CM | POA: Diagnosis not present

## 2018-08-27 DIAGNOSIS — Z951 Presence of aortocoronary bypass graft: Secondary | ICD-10-CM | POA: Diagnosis not present

## 2018-08-27 DIAGNOSIS — I361 Nonrheumatic tricuspid (valve) insufficiency: Secondary | ICD-10-CM

## 2018-08-27 DIAGNOSIS — I340 Nonrheumatic mitral (valve) insufficiency: Secondary | ICD-10-CM

## 2018-08-27 DIAGNOSIS — I051 Rheumatic mitral insufficiency: Secondary | ICD-10-CM | POA: Diagnosis not present

## 2018-08-27 DIAGNOSIS — I639 Cerebral infarction, unspecified: Secondary | ICD-10-CM

## 2018-08-27 DIAGNOSIS — Z7982 Long term (current) use of aspirin: Secondary | ICD-10-CM | POA: Diagnosis not present

## 2018-08-27 DIAGNOSIS — Z20828 Contact with and (suspected) exposure to other viral communicable diseases: Secondary | ICD-10-CM | POA: Diagnosis not present

## 2018-08-27 DIAGNOSIS — R4781 Slurred speech: Secondary | ICD-10-CM | POA: Diagnosis not present

## 2018-08-27 DIAGNOSIS — I2510 Atherosclerotic heart disease of native coronary artery without angina pectoris: Secondary | ICD-10-CM | POA: Diagnosis not present

## 2018-08-27 DIAGNOSIS — G20 Parkinson's disease: Secondary | ICD-10-CM

## 2018-08-27 DIAGNOSIS — G319 Degenerative disease of nervous system, unspecified: Secondary | ICD-10-CM | POA: Diagnosis not present

## 2018-08-27 DIAGNOSIS — I10 Essential (primary) hypertension: Secondary | ICD-10-CM | POA: Diagnosis not present

## 2018-08-27 DIAGNOSIS — Z79899 Other long term (current) drug therapy: Secondary | ICD-10-CM | POA: Diagnosis not present

## 2018-08-27 LAB — ECHOCARDIOGRAM COMPLETE
Height: 64 in
Weight: 2352.75 oz

## 2018-08-27 LAB — SARS CORONAVIRUS 2 BY RT PCR (HOSPITAL ORDER, PERFORMED IN ~~LOC~~ HOSPITAL LAB): SARS Coronavirus 2: NEGATIVE

## 2018-08-27 SURGERY — LAPAROSCOPIC CHOLECYSTECTOMY
Anesthesia: General

## 2018-08-27 MED ORDER — CARBIDOPA-LEVODOPA 25-100 MG PO TABS
1.0000 | ORAL_TABLET | Freq: Three times a day (TID) | ORAL | Status: DC
  Administered 2018-08-27: 1 via ORAL
  Filled 2018-08-27: qty 1

## 2018-08-27 MED ORDER — LORAZEPAM 2 MG/ML IJ SOLN
0.5000 mg | Freq: Once | INTRAMUSCULAR | Status: AC
  Administered 2018-08-27: 0.5 mg via INTRAVENOUS
  Filled 2018-08-27: qty 1

## 2018-08-27 MED ORDER — FAMOTIDINE 20 MG PO TABS: 20.0000 mg | ORAL_TABLET | Freq: Two times a day (BID) | ORAL | Status: DC | PRN

## 2018-08-27 MED ORDER — HYDRALAZINE HCL 20 MG/ML IJ SOLN
5.0000 mg | Freq: Once | INTRAMUSCULAR | Status: AC
  Administered 2018-08-27: 5 mg via INTRAVENOUS
  Filled 2018-08-27: qty 1

## 2018-08-27 MED ORDER — PANTOPRAZOLE SODIUM 40 MG PO TBEC
40.0000 mg | DELAYED_RELEASE_TABLET | Freq: Every day | ORAL | Status: DC
  Administered 2018-08-27: 40 mg via ORAL
  Filled 2018-08-27: qty 1

## 2018-08-27 MED ORDER — PRAVASTATIN SODIUM 10 MG PO TABS: 20.0000 mg | ORAL_TABLET | Freq: Every day | ORAL | Status: DC

## 2018-08-27 MED ORDER — TIZANIDINE HCL 4 MG PO TABS: 2.0000 mg | ORAL_TABLET | Freq: Four times a day (QID) | ORAL | Status: DC | PRN

## 2018-08-27 NOTE — Progress Notes (Signed)
STROKE TEAM PROGRESS NOTE   INTERVAL HISTORY I have personally reviewed history of presenting illness with the patient.  He developed sudden onset of right perioral and fingertip paresthesias as well as some speech difficulties lasting 10 to 15 minutes which recovered completely.  He has not had any recurrent events since then.  He states he did have a remote history of similar episode possibly a TIA long time back.  Patient has drainage tube from his gallbladder and was scheduled for elective gallbladder surgery tomorrow and had held aspirin and Plavix for his surgery.  Vitals:   08/27/18 0025 08/27/18 0300 08/27/18 0457 08/27/18 0737  BP: (!) 187/103 (!) 142/69 (!) 141/73 (!) 153/76  Pulse: 67 (!) 57 (!) 57 60  Resp: 14 17 18 16   Temp: 98.9 F (37.2 C) (!) 97.4 F (36.3 C) (!) 97.3 F (36.3 C) 98.5 F (36.9 C)  TempSrc: Oral Oral Oral Oral  SpO2: 98% 97% 99% 96%  Weight: 66.7 kg     Height: 5\' 4"  (1.626 m)       CBC:  Recent Labs  Lab 08/22/18 0941 08/26/18 2103 08/26/18 2110  WBC 9.0 7.6  --   NEUTROABS 6.6 5.3  --   HGB 15.0 14.7 15.3  HCT 47.3 44.4 45.0  MCV 94.4 92.9  --   PLT 255 259  --     Basic Metabolic Panel:  Recent Labs  Lab 08/22/18 0941 08/26/18 2103 08/26/18 2110  NA 140 140 140  K 4.4 4.1 4.0  CL 105 107 104  CO2 28 23  --   GLUCOSE 103* 117* 112*  BUN 12 14 14   CREATININE 0.96 0.99 1.00  CALCIUM 9.6 9.3  --    Lipid Panel:     Component Value Date/Time   CHOL 149 05/15/2018 1057   TRIG 145.0 05/15/2018 1057   HDL 50.10 05/15/2018 1057   CHOLHDL 3 05/15/2018 1057   VLDL 29.0 05/15/2018 1057   LDLCALC 70 05/15/2018 1057   HgbA1c: No results found for: HGBA1C Urine Drug Screen:     Component Value Date/Time   LABOPIA NONE DETECTED 08/26/2018 2103   COCAINSCRNUR NONE DETECTED 08/26/2018 2103   LABBENZ NONE DETECTED 08/26/2018 2103   AMPHETMU NONE DETECTED 08/26/2018 2103   THCU NONE DETECTED 08/26/2018 2103   LABBARB NONE DETECTED  08/26/2018 2103    Alcohol Level     Component Value Date/Time   ETH <10 08/26/2018 2103    IMAGING Dg Chest 2 View  Result Date: 08/27/2018 CLINICAL DATA:  TIA, admission EXAM: CHEST - 2 VIEW COMPARISON:  Radiograph May 22, 2018 FINDINGS: Postsurgical changes related to prior CABG including intact and aligned sternotomy wires and multiple surgical clips projecting over the mediastinum. The aorta is calcified. The remaining cardiomediastinal contours are unremarkable. Small hiatal hernia. No consolidation, features of edema, pneumothorax, or effusion. Pulmonary vascularity is normally distributed. No acute osseous or soft tissue abnormality. IMPRESSION: No active cardiopulmonary disease. Electronically Signed   By: Lovena Le M.D.   On: 08/27/2018 00:02   Ct Head Wo Contrast  Result Date: 08/26/2018 CLINICAL DATA:  Onset right arm and facial numbness at 7:15 p.m. today. EXAM: CT HEAD WITHOUT CONTRAST TECHNIQUE: Contiguous axial images were obtained from the base of the skull through the vertex without intravenous contrast. COMPARISON:  None. FINDINGS: Brain: No evidence of acute infarction, hemorrhage, hydrocephalus, extra-axial collection or mass lesion/mass effect. Chronic microvascular ischemic change is noted. Vascular: Atherosclerosis is identified. Skull: Intact.  No  focal lesion. Sinuses/Orbits: Negative. Other: None. IMPRESSION: No acute abnormality. Chronic microvascular ischemic change. Atherosclerosis. Electronically Signed   By: Inge Rise M.D.   On: 08/26/2018 21:27   Mr Brain Wo Contrast  Result Date: 08/27/2018 CLINICAL DATA:  Initial evaluation for acute right upper extremity tingling, slurred speech. Symptoms now resolved. EXAM: MRI HEAD WITHOUT CONTRAST TECHNIQUE: Multiplanar, multiecho pulse sequences of the brain and surrounding structures were obtained without intravenous contrast. COMPARISON:  Prior head CT from 08/26/2018. FINDINGS: Brain: Cerebral volume within  normal limits for age. Patchy T2/FLAIR hyperintensity within the periventricular, deep, and subcortical white matter both cerebral hemispheres most consistent with chronic microvascular ischemic disease, moderate nature. Patchy involvement of the pons noted. Superimposed remote lacunar infarct noted within the right thalamus. No abnormal foci of restricted diffusion to suggest acute or subacute ischemia. Gray-white matter differentiation maintained. No encephalomalacia to suggest chronic cortical infarction. No foci of susceptibility artifact to suggest acute or chronic intracranial hemorrhage. No mass lesion, midline shift or mass effect. Ventricles normal size without hydrocephalus. No extra-axial fluid collection. Pituitary gland suprasellar region normal. Midline structures intact. Vascular: Major intracranial vascular flow voids are maintained. Left vertebral artery hypoplastic and not well seen. Skull and upper cervical spine: Craniocervical junction within normal limits. Degenerative spondylolysis at C3-4 with resultant mild spinal stenosis. Bone marrow signal intensity within normal limits. No scalp soft tissue abnormality. Sinuses/Orbits: Globes and orbital soft tissues within normal limits. Paranasal sinuses are clear. No mastoid effusion. Inner ear structures normal. Other: None. IMPRESSION: 1. No acute intracranial infarct or other abnormality identified. 2. Age-appropriate cerebral atrophy with moderate chronic microvascular ischemic disease, with superimposed chronic right thalamic lacunar infarct. Electronically Signed   By: Jeannine Boga M.D.   On: 08/27/2018 02:47    PHYSICAL EXAM Pleasant elderly Caucasian male currently not in distress. . Afebrile. Head is nontraumatic. Neck is supple without bruit.    Cardiac exam no murmur or gallop. Lungs are clear to auscultation. Distal pulses are well felt. Neurological Exam ;  Awake  Alert oriented x 3. Normal speech and language.eye movements  full without nystagmus.fundi were not visualized. Vision acuity and fields appear normal. Hearing is normal. Palatal movements are normal. Face symmetric. Tongue midline. Normal strength, tone, reflexes and coordination. Normal sensation. Gait deferred.  ASSESSMENT/PLAN Mr. Brandon Rojas is a 73 y.o. male with history of CAD and Parkinsons presenting with transient R hand and face numbness.   L brain TIA  CT head No acute abnormality. Chronic Small vessel disease. atherosclerosis     MRI  No acute abnormality. Small vessel disease. Atrophy. Old R thalamic lacune  TCD  suboptimal left temporal and occipital windows limit exam.  Carotid Doppler  B ICA 1-39% stenosis, VAs antegrade   2D Echo pending  LDL pending   HgbA1c pending   SCDs for VTE prophylaxis  aspirin 81 mg daily and clopidogrel 75 mg daily on hold prior to admission for planned cholecystectomy, now on aspirin 325 mg daily. Ok to hold antithrombotics for planned OR if surgery is emergent please resume when stable post-op.  Otherwise postpone surgery for 3 months if not emergent  Therapy recommendations:  No therapy needs  Disposition:  pending   Hyperlipidemia  Home meds:  mevacor 20  LDL 70, goal < 70  Mevacor on hold for impending OR.   Continue statin at discharge  Other Stroke Risk Factors  Advanced age  Former Cigarette smoker, quit 39 yrs ago  Family hx stroke (father )  Coronary artery disease s/p CABG, mitral regurgitation   Other Active Problems  H/o Acute cholecystitis w recent perc chole 05/22/2018 with plans for cholecystectomy -> ok for OR tomorrow from stroke standpoint  If surgery not emergent recommend postpone for 3 months and restart aspirin Plavix  Parkinsons on Sinemet  GERD on PPI  Hospital day # 0  I have personally obtained history,examined this patient, reviewed notes, independently viewed imaging studies, participated in medical decision making and plan of care.ROS  completed by me personally and pertinent positives fully documented  I have made any additions or clarifications directly to the above note.  The patient presented with transient perioral and right hand paresthesia secondary to TIA from small vessel disease while antiplatelet therapy was on hold for elective gallbladder surgery.  He remains at risk for recurrent TIAs and strokes and the risk is maximum in the first 3 months.  If surgery is deemed to be emergent recommend to the surgery in the next few days while hospitalized and resume antiplatelet therapy after that.  In case surgery is not emergent recommend postponing it for 3 months at least while patient is back on his antiplatelet therapy to be out of the maximum risks risk.  For recurrent strokes.  Long discussion with patient as well as with Dr. Einar Grad and answered questions.  Greater than 50% time during this 35-minute visit was spent on counseling and coordination of care about his TIA and discussion about risk benefit of antiplatelet therapy holding in the peri-operative situation and answered questions  Antony Contras, MD Medical Director [place] Pager: [phone] 08/27/2018 1:00 PM   To contact Stroke Continuity provider, please refer to [email or web address] After hours, contact General Neurology

## 2018-08-27 NOTE — Discharge Instructions (Signed)
Transient Ischemic Attack ° °A transient ischemic attack (TIA) is a "warning stroke" that causes stroke-like symptoms that go away quickly. A TIA does not cause lasting damage to the brain. But having a TIA is a sign that you may be at risk for a stroke. Lifestyle changes and medical treatments can help prevent a stroke. °It is important to know the symptoms of a TIA and what to do. Get help right away, even if your symptoms go away. The symptoms of a TIA are the same as those of a stroke. They can happen fast, and they usually go away within minutes or hours. They can include: °· Weakness or loss of feeling in your face, arm, or leg. This often happens on one side of your body. °· Trouble walking. °· Trouble moving your arms or legs. °· Trouble talking or understanding what people are saying. °· Trouble seeing. °· Seeing two of one object (double vision). °· Feeling dizzy. °· Feeling confused. °· Loss of balance or coordination. °· Feeling sick to your stomach (nauseous) and throwing up (vomiting). °· A very bad headache for no reason. °What increases the risk? °Certain things may make you more likely to have a TIA. Some of these are things that you can change, such as: °· Being very overweight (obese). °· Using products that contain nicotine or tobacco, such as cigarettes and e-cigarettes. °· Taking birth control pills. °· Not being active. °· Drinking too much alcohol. °· Using drugs. °Other risk factors include: °· Having an irregular heartbeat (atrial fibrillation). °· Being African American or Hispanic. °· Having had blood clots, stroke, TIA, or heart attack in the past. °· Being a woman with a history of high blood pressure in pregnancy (preeclampsia). °· Being over the age of 60. °· Being male. °· Having family history of stroke. °· Having the following diseases or conditions: °? High blood pressure. °? High cholesterol. °? Diabetes. °? Heart disease. °? Sickle cell disease. °? Sleep apnea. °? Migraine  headache. °? Long-term (chronic) diseases that cause soreness and swelling (inflammation). °? Disorders that affect how your blood clots. °Follow these instructions at home: °Medicines ° °· Take over-the-counter and prescription medicines only as told by your doctor. °· If you were told to take aspirin or another medicine to thin your blood, take it exactly as told by your doctor. °? Taking too much of the medicine can cause bleeding. °? Taking too little of the medicine may not work to treat the problem. °Eating and drinking ° °· Eat 5 or more servings of fruits and vegetables each day. °· Follow instructions from your doctor about your diet. You may need to follow a certain diet to help lower your risk of having a stroke. You may need to: °? Eat a diet that is low in fat and salt. °? Eat foods that contain a lot of fiber. °? Limit the amount of carbohydrates and sugar in your diet. °· Limit alcohol intake to 1 drink a day for nonpregnant women and 2 drinks a day for men. One drink equals 12 oz of beer, 5 oz of wine, or 1½ oz of hard liquor. °General instructions °· Keep a healthy weight. °· Stay active. Try to get at least 30 minutes of activity on all or most days. °· Find out if you have a condition called sleep apnea. Get treatment if needed. °· Do not use any products that contain nicotine or tobacco, such as cigarettes and e-cigarettes. If you need help quitting,   ask your doctor. °· Do not abuse drugs. °· Keep all follow-up visits as told by your doctor. This is important. °Get help right away if: °· You have any signs of stroke. "BE FAST" is an easy way to remember the main warning signs: °? B - Balance. Signs are dizziness, sudden trouble walking, or loss of balance. °? E - Eyes. Signs are trouble seeing or a sudden change in how you see. °? F - Face. Signs are sudden weakness or loss of feeling of the face, or the face or eyelid drooping on one side. °? A - Arms. Signs are weakness or loss of feeling in an  arm. This happens suddenly and usually on one side of the body. °? S - Speech. Signs are sudden trouble speaking, slurred speech, or trouble understanding what people say. °? T - Time. Time to call emergency services. Write down what time symptoms started. °· You have other signs of stroke, such as: °? A sudden, very bad headache with no known cause. °? Feeling sick to your stomach (nausea). °? Throwing up (vomiting). °? Jerky movements that you cannot control (seizure). °These symptoms may be an emergency. Do not wait to see if the symptoms will go away. Get medical help right away. Call your local emergency services (911 in the [place] Do not drive yourself to the hospital. °Summary °· A transient ischemic attack (TIA) is a "warning stroke" that causes stroke-like symptoms that go away quickly. °· A TIA is a medical emergency. Get help right away, even if your symptoms go away. °· A TIA does not cause lasting damage to the brain. °· Having a TIA is a sign that you may be at risk for a stroke. Lifestyle changes and medical treatments can help prevent a stroke. °This information is not intended to replace advice given to you by your health care provider. Make sure you discuss any questions you have with your health care provider. °Document Released: 10/04/2007 Document Revised: 09/20/2017 Document Reviewed: 03/28/2016 °Elsevier Patient Education © 2020 [employer] ° °

## 2018-08-27 NOTE — Plan of Care (Signed)
  Problem: Education: Goal: Knowledge of secondary prevention will improve Outcome: Completed/Met Goal: Knowledge of patient specific risk factors addressed and post discharge goals established will improve Outcome: Completed/Met  Discharge instructions reviewed with patient.  These included, but were not limited to, the following:  stroke symptoms, stroke emergency response (including 911), prescription medications, activity, when to call the MD, follow-up appointments, etc.  Comprehension of instructions validated utilizing the "teach-back" technique.

## 2018-08-27 NOTE — Discharge Summary (Signed)
Physician Discharge Summary  Brandon Rojas OYD:[id number] DOB: Jul 27, 1945 DOA: 08/26/2018  PCP: Biagio Borg, MD  Admit date: 08/26/2018 Discharge date: 08/27/2018  Admitted From: Home Disposition: Home  Recommendations for Outpatient Follow-up:  1. Follow up with PCP in 1-2 weeks 2. Please obtain BMP/CBC in one week 3. Please follow up on the following pending results:  Home Health: None Equipment/Devices: None  Discharge Condition: Stable CODE STATUS: Full code Diet recommendation: Cardiac  Subjective: Seen and examined.  He has no complaints.  Did not have any symptoms regarding a stroke.  Brief/Interim Summary:  Brandon Rojas  is a 73 y.o. male, w hypertension, hyperlipidemia, CAD s/p CABG, mitral regurgitation, gerd, / hiatal hernia, anxiety/ depression, parkinsons,  w recent admission 05/20/2018 for acute cholecystitis s/p percutaneous chole  05/22/2018, now off plavix for the past 5 days in anticipation of cholecystectomy apparently presented with c/o right arm tingling and right lip tingling starting 7:15 pm,  Pt states had slurred speech as well.  Pt states that his symptoms lasted about 15 minutes.  Pt denies headache, cough, cp, palp, sob, n/v, focal neurological weakness.  He was brought into the emergency department for further evaluation.  By the time he arrived to the emergency department, his symptoms had already resolved.  He was hemodynamically stable upon presentation.  Stroke work-up was done.  He ended up having CT of the head which was unremarkable.  He was admitted by hospitalist service.  Seen by neurology.  Further work-up was done which included MRI of the brain which also ruled out any acute stroke.  Doppler carotid were unremarkable.  Echo done but results pending.  Patient is doing well and he wants to go home.  Patient was seen by neurology and they recommended that either patient have his laparoscopic cholecystectomy done on emergent basis and resume his aspirin and  Plavix both as soon as possible or have his surgery postponed up until 3 months as they recommended continuing dual antiplatelet therapy after TIA for at least 3 months.  Per neurology recommendation, I consulted general surgery and I was informed that they have decided to postpone his surgery for 3 months with continuation of his cholecystostomy drain and that they will follow-up with patient as an outpatient.  They have also cleared the patient to be on both aspirin and Plavix.  I then called patient's wife and informed all of this to her.  I reiterated that patient needs to be on both aspirin and Plavix from now on until he has surgery 3 months after today.  She verbalized understanding.  She had no further questions.  Discharge Diagnoses:  Principal Problem:   TIA (transient ischemic attack) Active Problems:   Parkinson's disease ([place])   Mitral regurgitation   CAD (coronary artery disease) of artery bypass graft    Discharge Instructions  Discharge Instructions    Discharge patient   Complete by: As directed    Discharge disposition: 01-Home or Self Care   Discharge patient date: 08/27/2018     Allergies as of 08/27/2018      Reactions   Atorvastatin Other (See Comments)   Muscle cramps   Simvastatin Other (See Comments)   Muscle cramps      Medication List    TAKE these medications   aspirin 81 MG tablet Take 81 mg by mouth daily.   carbidopa-levodopa 25-100 MG tablet Commonly known as: SINEMET IR Take 1 tablet by mouth 3 (three) times daily.   clopidogrel 75 MG  tablet Commonly known as: PLAVIX TAKE 1 TABLET EVERY DAY WITH BREAKFAST What changed: See the new instructions.   esomeprazole 40 MG capsule Commonly known as: NEXIUM TAKE 1 CAPSULE BY MOUTH ONCE DAILY   famotidine 20 MG tablet Commonly known as: Pepcid Take 1 tablet (20 mg total) by mouth 2 (two) times daily. What changed:   when to take this  reasons to take this   lovastatin 20 MG  tablet Commonly known as: MEVACOR TAKE 1 TABLET AT BEDTIME   multivitamin with minerals Tabs tablet Take 1 tablet by mouth daily.   Nitrostat 0.4 MG SL tablet Generic drug: nitroGLYCERIN PLACE 1 TABLET (0.4 MG TOTAL) UNDER THE TONGUE EVERY 5 (FIVE) MINUTES AS NEEDED. What changed: See the new instructions.   tetrahydrozoline-zinc 0.05-0.25 % ophthalmic solution Commonly known as: VISINE-AC Place 2 drops into both eyes 3 (three) times daily as needed (for burning or eye redness).   tiZANidine 2 MG tablet Commonly known as: ZANAFLEX Take 1 tablet (2 mg total) by mouth every 6 (six) hours as needed for muscle spasms.   traMADol 50 MG tablet Commonly known as: ULTRAM Take 1 tablet (50 mg total) by mouth every 6 (six) hours as needed. for pain What changed:   reasons to take this  additional instructions   VITAMIN B COMPLEX PO Take 1 tablet by mouth every other day.      Follow-up Information    Biagio Borg, MD Follow up in 1 week(s).   Specialties: Internal Medicine, Radiology Contact information: [address] [address] [phone]        Minus Breeding, MD .   Specialty: Cardiology Contact information: [address] [place] [address] [phone]        Ileana Roup, MD. Call.   Specialty: General Surgery Why: We are working on your appointment, call to confirm Contact information: [place] [address] [phone]          Allergies  Allergen Reactions  . Atorvastatin Other (See Comments)    Muscle cramps  . Simvastatin Other (See Comments)    Muscle cramps    Consultations: Neurology   Procedures/Studies: Dg Chest 2 View  Result Date: 08/27/2018 CLINICAL DATA:  TIA, admission EXAM: CHEST - 2 VIEW COMPARISON:  Radiograph May 22, 2018 FINDINGS: Postsurgical changes related to prior CABG including intact and aligned sternotomy wires and multiple surgical clips projecting over  the mediastinum. The aorta is calcified. The remaining cardiomediastinal contours are unremarkable. Small hiatal hernia. No consolidation, features of edema, pneumothorax, or effusion. Pulmonary vascularity is normally distributed. No acute osseous or soft tissue abnormality. IMPRESSION: No active cardiopulmonary disease. Electronically Signed   By: Lovena Le M.D.   On: 08/27/2018 00:02   Ct Head Wo Contrast  Result Date: 08/26/2018 CLINICAL DATA:  Onset right arm and facial numbness at 7:15 p.m. today. EXAM: CT HEAD WITHOUT CONTRAST TECHNIQUE: Contiguous axial images were obtained from the base of the skull through the vertex without intravenous contrast. COMPARISON:  None. FINDINGS: Brain: No evidence of acute infarction, hemorrhage, hydrocephalus, extra-axial collection or mass lesion/mass effect. Chronic microvascular ischemic change is noted. Vascular: Atherosclerosis is identified. Skull: Intact.  No focal lesion. Sinuses/Orbits: Negative. Other: None. IMPRESSION: No acute abnormality. Chronic microvascular ischemic change. Atherosclerosis. Electronically Signed   By: Inge Rise M.D.   On: 08/26/2018 21:27   Mr Brain Wo Contrast  Result Date: 08/27/2018 CLINICAL DATA:  Initial evaluation for  acute right upper extremity tingling, slurred speech. Symptoms now resolved. EXAM: MRI HEAD WITHOUT CONTRAST TECHNIQUE: Multiplanar, multiecho pulse sequences of the brain and surrounding structures were obtained without intravenous contrast. COMPARISON:  Prior head CT from 08/26/2018. FINDINGS: Brain: Cerebral volume within normal limits for age. Patchy T2/FLAIR hyperintensity within the periventricular, deep, and subcortical white matter both cerebral hemispheres most consistent with chronic microvascular ischemic disease, moderate nature. Patchy involvement of the pons noted. Superimposed remote lacunar infarct noted within the right thalamus. No abnormal foci of restricted diffusion to suggest acute or  subacute ischemia. Gray-white matter differentiation maintained. No encephalomalacia to suggest chronic cortical infarction. No foci of susceptibility artifact to suggest acute or chronic intracranial hemorrhage. No mass lesion, midline shift or mass effect. Ventricles normal size without hydrocephalus. No extra-axial fluid collection. Pituitary gland suprasellar region normal. Midline structures intact. Vascular: Major intracranial vascular flow voids are maintained. Left vertebral artery hypoplastic and not well seen. Skull and upper cervical spine: Craniocervical junction within normal limits. Degenerative spondylolysis at C3-4 with resultant mild spinal stenosis. Bone marrow signal intensity within normal limits. No scalp soft tissue abnormality. Sinuses/Orbits: Globes and orbital soft tissues within normal limits. Paranasal sinuses are clear. No mastoid effusion. Inner ear structures normal. Other: None. IMPRESSION: 1. No acute intracranial infarct or other abnormality identified. 2. Age-appropriate cerebral atrophy with moderate chronic microvascular ischemic disease, with superimposed chronic right thalamic lacunar infarct. Electronically Signed   By: Jeannine Boga M.D.   On: 08/27/2018 02:47   Vas US Carotid (at [place] Only)  Result Date: 08/27/2018 Carotid Arterial Duplex Study Indications:  CVA. Risk Factors: Hypertension, coronary artery disease. Performing Technologist: Maudry Mayhew MHA, RDMS, RVT, RDCS  Examination Guidelines: A complete evaluation includes B-mode imaging, spectral Doppler, color Doppler, and power Doppler as needed of all accessible portions of each vessel. Bilateral testing is considered an integral part of a complete examination. Limited examinations for reoccurring indications may be performed as noted.  Right Carotid Findings: +----------+-------+-------+--------+---------------------------------+--------+           PSV    EDV    StenosisPlaque Description                Comments           cm/s   cm/s                                                     +----------+-------+-------+--------+---------------------------------+--------+ CCA Prox  121    13                                                       +----------+-------+-------+--------+---------------------------------+--------+ CCA Distal95     15             heterogenous, irregular and                                               calcific                                  +----------+-------+-------+--------+---------------------------------+--------+  ICA Prox  75     13             heterogenous, irregular and                                               calcific                                  +----------+-------+-------+--------+---------------------------------+--------+ ICA Distal79     15                                                       +----------+-------+-------+--------+---------------------------------+--------+ ECA       153    11             irregular, heterogenous and                                               calcific                                  +----------+-------+-------+--------+---------------------------------+--------+ +----------+--------+-------+----------------+-------------------+           PSV cm/sEDV cmsDescribe        Arm Pressure (mmHG) +----------+--------+-------+----------------+-------------------+ [id number]            Multiphasic, WNL                    +----------+--------+-------+----------------+-------------------+ +---------+--------+--+--------+--+---------+ VertebralPSV cm/s46EDV cm/s14Antegrade +---------+--------+--+--------+--+---------+  Left Carotid Findings: +----------+--------+--------+--------+--------------------------+--------+           PSV cm/sEDV cm/sStenosisPlaque Description        Comments  +----------+--------+--------+--------+--------------------------+--------+ CCA Prox  93      16                                                 +----------+--------+--------+--------+--------------------------+--------+ CCA Distal99      17              smooth and heterogenous            +----------+--------+--------+--------+--------------------------+--------+ ICA Prox  88      23              irregular and heterogenous         +----------+--------+--------+--------+--------------------------+--------+ ICA Distal103     20                                                 +----------+--------+--------+--------+--------------------------+--------+ ECA       224     12              smooth and heterogenous            +----------+--------+--------+--------+--------------------------+--------+ +----------+--------+--------+----------------+-------------------+ SubclavianPSV cm/sEDV cm/sDescribe  Arm Pressure (mmHG) +----------+--------+--------+----------------+-------------------+           66              Multiphasic, WNL                    +----------+--------+--------+----------------+-------------------+ +---------+--------+--------+--------------+ VertebralPSV cm/sEDV cm/sNot identified +---------+--------+--------+--------------+  Summary: Right Carotid: Velocities in the right ICA are consistent with a 1-39% stenosis. Left Carotid: Velocities in the left ICA are consistent with a 1-39% stenosis. Vertebrals:  Right vertebral artery demonstrates antegrade flow. Left vertebral              artery was not visualized. Subclavians: Normal flow hemodynamics were seen in bilateral subclavian              arteries. *See table(s) above for measurements and observations.  Electronically signed by Antony Contras MD on 08/27/2018 at 12:42:47 PM.    Final    Vas [place] Transcranial Doppler  Result Date: 08/27/2018  Transcranial Doppler Indications: Stroke. Performing  Technologist: Maudry Mayhew MHA, RDMS, RVT, RDCS  Examination Guidelines: A complete evaluation includes B-mode imaging, spectral Doppler, color Doppler, and power Doppler as needed of all accessible portions of each vessel. Bilateral testing is considered an integral part of a complete examination. Limited examinations for reoccurring indications may be performed as noted.  +----------+-------------+----------+-----------+-------+ RIGHT TCD Right VM (cm)Depth (cm)PulsatilityComment +----------+-------------+----------+-----------+-------+ MCA           59.00                 1.36            +----------+-------------+----------+-----------+-------+ ACA          -34.00                 1.42            +----------+-------------+----------+-----------+-------+ Term ICA      39.00                 1.27            +----------+-------------+----------+-----------+-------+ PCA           25.00                 1.16            +----------+-------------+----------+-----------+-------+ Opthalmic     20.00                 1.41            +----------+-------------+----------+-----------+-------+ ICA siphon    59.00                 1.21            +----------+-------------+----------+-----------+-------+ Vertebral    -15.00                 0.85            +----------+-------------+----------+-----------+-------+  +----------+------------+----------+-----------+------------------+ LEFT TCD  Left VM (cm)Depth (cm)Pulsatility     Comment       +----------+------------+----------+-----------+------------------+ MCA          29.00                 1.27                       +----------+------------+----------+-----------+------------------+ ACA  Unable to insonate +----------+------------+----------+-----------+------------------+ Term ICA                                   Unable to insonate  +----------+------------+----------+-----------+------------------+ PCA                                        Unable to insonate +----------+------------+----------+-----------+------------------+ Opthalmic    19.00                 1.18                       +----------+------------+----------+-----------+------------------+ ICA siphon   27.00                 1.80                       +----------+------------+----------+-----------+------------------+ Vertebral                                  Unable to insonate +----------+------------+----------+-----------+------------------+  +------------+-------+------------------+             VM cm/s     Comment       +------------+-------+------------------+ Prox Basilar       Unable to insonate +------------+-------+------------------+ Summary:  Normal mean flow velocities in anterior circulation vessles on right side. Poor left temporal and occipital windows limits evaluation.Elevated pulsatiity indices throughout suggests diffus eintracranial atherosclerosis. *See table(s) above for measurements and observations.  Diagnosing physician: Antony Contras MD Electronically signed by Antony Contras MD on 08/27/2018 at 12:44:32 PM.    Final      Discharge Exam: Vitals:   08/27/18 0737 08/27/18 1207  BP: (!) 153/76 137/73  Pulse: 60 (!) 55  Resp: 16 16  Temp: 98.5 F (36.9 C) 98.4 F (36.9 C)  SpO2: 96% 94%   Vitals:   08/27/18 0300 08/27/18 0457 08/27/18 0737 08/27/18 1207  BP: (!) 142/69 (!) 141/73 (!) 153/76 137/73  Pulse: (!) 57 (!) 57 60 (!) 55  Resp: 17 18 16 16   Temp: (!) 97.4 F (36.3 C) (!) 97.3 F (36.3 C) 98.5 F (36.9 C) 98.4 F (36.9 C)  TempSrc: Oral Oral Oral Oral  SpO2: 97% 99% 96% 94%  Weight:      Height:        General: Pt is alert, awake, not in acute distress Cardiovascular: RRR, S1/S2 +, no rubs, no gallops Respiratory: CTA bilaterally, no wheezing, no rhonchi Abdominal: Soft, NT, ND, bowel  sounds + Extremities: no edema, no cyanosis    The results of significant diagnostics from this hospitalization (including imaging, microbiology, ancillary and laboratory) are listed below for reference.     Microbiology: Recent Results (from the past 240 hour(s))  SARS CORONAVIRUS 2 Nasal Swab Aptima Multi Swab     Status: None   Collection Time: 08/23/18  4:33 PM   Specimen: Aptima Multi Swab; Nasal Swab  Result Value Ref Range Status   SARS Coronavirus 2 NEGATIVE NEGATIVE Final    Comment: (NOTE) SARS-CoV-2 target nucleic acids are NOT DETECTED. The SARS-CoV-2 RNA is generally detectable in upper and lower respiratory specimens during the acute phase of infection. Negative results do not preclude SARS-CoV-2 infection, do not rule out co-infections with other pathogens,  and should not be used as the sole basis for treatment or other patient management decisions. Negative results must be combined with clinical observations, patient history, and epidemiological information. The expected result is Negative. Fact Sheet for Patients: SugarRoll.be Fact Sheet for Healthcare Providers: https://www.woods-mathews.com/ This test is not yet approved or cleared by the [place] FDA and  has been authorized for detection and/or diagnosis of SARS-CoV-2 by FDA under an Emergency Use Authorization (EUA). This EUA will remain  in effect (meaning this test can be used) for the duration of the COVID-19 declaration under Section 56 4(b)(1) of the Act, 21 U.S.C. section 360bbb-3(b)(1), unless the authorization is terminated or revoked sooner. Performed at [facility], [place] [address] [place], [place] [address]   SARS Coronavirus 2 [facility] order, Performed in [facility] hospital lab) Nasopharyngeal Nasopharyngeal Swab     Status: None   Collection Time: 08/26/18 11:34 PM   Specimen: Nasopharyngeal Swab  Result Value Ref Range Status   SARS  Coronavirus 2 NEGATIVE NEGATIVE Final    Comment: (NOTE) If result is NEGATIVE SARS-CoV-2 target nucleic acids are NOT DETECTED. The SARS-CoV-2 RNA is generally detectable in upper and lower  respiratory specimens during the acute phase of infection. The lowest  concentration of SARS-CoV-2 viral copies this assay can detect is 250  copies / mL. A negative result does not preclude SARS-CoV-2 infection  and should not be used as the sole basis for treatment or other  patient management decisions.  A negative result may occur with  improper specimen collection / handling, submission of specimen other  than nasopharyngeal swab, presence of viral mutation(s) within the  areas targeted by this assay, and inadequate number of viral copies  (<250 copies / mL). A negative result must be combined with clinical  observations, patient history, and epidemiological information. If result is POSITIVE SARS-CoV-2 target nucleic acids are DETECTED. The SARS-CoV-2 RNA is generally detectable in upper and lower  respiratory specimens dur ing the acute phase of infection.  Positive  results are indicative of active infection with SARS-CoV-2.  Clinical  correlation with patient history and other diagnostic information is  necessary to determine patient infection status.  Positive results do  not rule out bacterial infection or co-infection with other viruses. If result is PRESUMPTIVE POSTIVE SARS-CoV-2 nucleic acids MAY BE PRESENT.   A presumptive positive result was obtained on the submitted specimen  and confirmed on repeat testing.  While 2019 novel coronavirus  (SARS-CoV-2) nucleic acids may be present in the submitted sample  additional confirmatory testing may be necessary for epidemiological  and / or clinical management purposes  to differentiate between  SARS-CoV-2 and other Sarbecovirus currently known to infect humans.  If clinically indicated additional testing with an alternate test   methodology [phone]) is advised. The SARS-CoV-2 RNA is generally  detectable in upper and lower respiratory sp ecimens during the acute  phase of infection. The expected result is Negative. Fact Sheet for Patients:  StrictlyIdeas.no Fact Sheet for Healthcare Providers: BankingDealers.co.za This test is not yet approved or cleared by the [place] FDA and has been authorized for detection and/or diagnosis of SARS-CoV-2 by FDA under an Emergency Use Authorization (EUA).  This EUA will remain in effect (meaning this test can be used) for the duration of the COVID-19 declaration under Section 564(b)(1) of the Act, 21 U.S.C. section 360bbb-3(b)(1), unless the authorization is terminated or revoked sooner. Performed at [facility], [address] [place],  [place] [address]      Labs: BNP (last 3 results) No results for input(s): BNP in the last 8760 hours. Basic Metabolic Panel: Recent Labs  Lab 08/22/18 0941 08/26/18 2103 08/26/18 2110  NA 140 140 140  K 4.4 4.1 4.0  CL 105 107 104  CO2 28 23  --   GLUCOSE 103* 117* 112*  BUN 12 14 14   CREATININE 0.96 0.99 1.00  CALCIUM 9.6 9.3  --    Liver Function Tests: Recent Labs  Lab 08/22/18 0941 08/26/18 2103  AST 24 26  ALT 8 6  ALKPHOS 82 75  BILITOT 0.6 0.4  PROT 7.7 7.2  ALBUMIN 3.9 3.7   No results for input(s): LIPASE, AMYLASE in the last 168 hours. No results for input(s): AMMONIA in the last 168 hours. CBC: Recent Labs  Lab 08/22/18 0941 08/26/18 2103 08/26/18 2110  WBC 9.0 7.6  --   NEUTROABS 6.6 5.3  --   HGB 15.0 14.7 15.3  HCT 47.3 44.4 45.0  MCV 94.4 92.9  --   PLT 255 259  --    Cardiac Enzymes: No results for input(s): CKTOTAL, CKMB, CKMBINDEX, TROPONINI in the last 168 hours. BNP: Invalid input(s): POCBNP CBG: Recent Labs  Lab 08/26/18 2054  GLUCAP 121*   D-Dimer No results for input(s): DDIMER in the last 72  hours. Hgb A1c No results for input(s): HGBA1C in the last 72 hours. Lipid Profile No results for input(s): CHOL, HDL, LDLCALC, TRIG, CHOLHDL, LDLDIRECT in the last 72 hours. Thyroid function studies No results for input(s): TSH, T4TOTAL, T3FREE, THYROIDAB in the last 72 hours.  Invalid input(s): FREET3 Anemia work up No results for input(s): VITAMINB12, FOLATE, FERRITIN, TIBC, IRON, RETICCTPCT in the last 72 hours. Urinalysis    Component Value Date/Time   COLORURINE YELLOW 08/26/2018 2103   APPEARANCEUR CLEAR 08/26/2018 2103   LABSPEC 1.011 08/26/2018 2103   PHURINE 6.0 08/26/2018 2103   GLUCOSEU NEGATIVE 08/26/2018 2103   GLUCOSEU NEGATIVE 05/15/2018 1057   HGBUR NEGATIVE 08/26/2018 2103   BILIRUBINUR NEGATIVE 08/26/2018 2103   KETONESUR NEGATIVE 08/26/2018 2103   PROTEINUR NEGATIVE 08/26/2018 2103   UROBILINOGEN 0.2 05/15/2018 1057   NITRITE NEGATIVE 08/26/2018 2103   LEUKOCYTESUR NEGATIVE 08/26/2018 2103   Sepsis Labs Invalid input(s): PROCALCITONIN,  WBC,  LACTICIDVEN Microbiology Recent Results (from the past 240 hour(s))  SARS CORONAVIRUS 2 Nasal Swab Aptima Multi Swab     Status: None   Collection Time: 08/23/18  4:33 PM   Specimen: Aptima Multi Swab; Nasal Swab  Result Value Ref Range Status   SARS Coronavirus 2 NEGATIVE NEGATIVE Final    Comment: (NOTE) SARS-CoV-2 target nucleic acids are NOT DETECTED. The SARS-CoV-2 RNA is generally detectable in upper and lower respiratory specimens during the acute phase of infection. Negative results do not preclude SARS-CoV-2 infection, do not rule out co-infections with other pathogens, and should not be used as the sole basis for treatment or other patient management decisions. Negative results must be combined with clinical observations, patient history, and epidemiological information. The expected result is Negative. Fact Sheet for Patients: SugarRoll.be Fact Sheet for Healthcare  Providers: https://www.woods-mathews.com/ This test is not yet approved or cleared by the [place] FDA and  has been authorized for detection and/or diagnosis of SARS-CoV-2 by FDA under an Emergency Use Authorization (EUA). This EUA will remain  in effect (meaning this test can be used) for the duration of the COVID-19 declaration under Section 56 4(b)(1) of the Act, 21  U.S.C. section 360bbb-3(b)(1), unless the authorization is terminated or revoked sooner. Performed at [facility], [place] [address], [place], [place] [address]   SARS Coronavirus 2 [facility] order, Performed in [facility] hospital lab) Nasopharyngeal Nasopharyngeal Swab     Status: None   Collection Time: 08/26/18 11:34 PM   Specimen: Nasopharyngeal Swab  Result Value Ref Range Status   SARS Coronavirus 2 NEGATIVE NEGATIVE Final    Comment: (NOTE) If result is NEGATIVE SARS-CoV-2 target nucleic acids are NOT DETECTED. The SARS-CoV-2 RNA is generally detectable in upper and lower  respiratory specimens during the acute phase of infection. The lowest  concentration of SARS-CoV-2 viral copies this assay can detect is 250  copies / mL. A negative result does not preclude SARS-CoV-2 infection  and should not be used as the sole basis for treatment or other  patient management decisions.  A negative result may occur with  improper specimen collection / handling, submission of specimen other  than nasopharyngeal swab, presence of viral mutation(s) within the  areas targeted by this assay, and inadequate number of viral copies  (<250 copies / mL). A negative result must be combined with clinical  observations, patient history, and epidemiological information. If result is POSITIVE SARS-CoV-2 target nucleic acids are DETECTED. The SARS-CoV-2 RNA is generally detectable in upper and lower  respiratory specimens dur ing the acute phase of infection.  Positive  results are indicative of active infection  with SARS-CoV-2.  Clinical  correlation with patient history and other diagnostic information is  necessary to determine patient infection status.  Positive results do  not rule out bacterial infection or co-infection with other viruses. If result is PRESUMPTIVE POSTIVE SARS-CoV-2 nucleic acids MAY BE PRESENT.   A presumptive positive result was obtained on the submitted specimen  and confirmed on repeat testing.  While 2019 novel coronavirus  (SARS-CoV-2) nucleic acids may be present in the submitted sample  additional confirmatory testing may be necessary for epidemiological  and / or clinical management purposes  to differentiate between  SARS-CoV-2 and other Sarbecovirus currently known to infect humans.  If clinically indicated additional testing with an alternate test  methodology [phone]) is advised. The SARS-CoV-2 RNA is generally  detectable in upper and lower respiratory sp ecimens during the acute  phase of infection. The expected result is Negative. Fact Sheet for Patients:  StrictlyIdeas.no Fact Sheet for Healthcare Providers: BankingDealers.co.za This test is not yet approved or cleared by the [place] FDA and has been authorized for detection and/or diagnosis of SARS-CoV-2 by FDA under an Emergency Use Authorization (EUA).  This EUA will remain in effect (meaning this test can be used) for the duration of the COVID-19 declaration under Section 564(b)(1) of the Act, 21 U.S.C. section 360bbb-3(b)(1), unless the authorization is terminated or revoked sooner. Performed at [facility], [place] [address] [place], [place] [address]      Time coordinating discharge: Over 30 minutes  SIGNED:   Darliss Cheney, MD  [facility] 08/27/2018, 1:26 PM Pager [phone]  If 7PM-7AM, please contact night-coverage [email or web address] Password [email or web address]

## 2018-08-27 NOTE — Progress Notes (Signed)
  Echocardiogram 2D Echocardiogram has been performed.  Joory Gough L Androw 08/27/2018, 1:53 PM

## 2018-08-27 NOTE — Evaluation (Signed)
Occupational Therapy Evaluation Patient Details Name: Brandon Rojas MRN: [id number] DOB: 1945-03-29 Today's Date: 08/27/2018    History of Present Illness Pt is a 73 year old man presenting with R hand and facial numbness, which has since resolved. Per notes, likely TIA. MRI negative for acute abnormality. Relevant PMH includes HTN, hyperlipidemia, CAD s/p CABG, GERD/hiatal hernia, mitral regurgitation, arthritis, and Parkinson's Disease.   Clinical Impression   This 74 y/o male presents with the above. PTA pt reports mod independent with functional mobility (intermittent use of cane), receives some assist from spouse for ADL. Pt currently requiring minA for functional transfers, setup/minguard assist for seated UB ADL and modA for LB and toileting ADL. Pt mostly limited due to weakness and balance deficits at this time. Spouse present and supportive during session. Pt will benefit from continued acute OT services and recommend follow up [facility] services after discharge to maximize his safety and independence with ADL and mobility. Will follow.     Follow Up Recommendations  Home health OT;Supervision/Assistance - 24 hour    Equipment Recommendations  None recommended by OT(pt's DME needs are met, decline 3:1)           Precautions / Restrictions Precautions Precautions: Fall;Other (comment)(cholecystectomy tube) Restrictions Weight Bearing Restrictions: No      Mobility Bed Mobility Overal bed mobility: Needs Assistance Bed Mobility: Supine to Sit     Supine to sit: Min assist     General bed mobility comments: HOB elevated, light assist for LEs fully over EOB and for trunk elevation   Transfers Overall transfer level: Needs assistance Equipment used: 1 person hand held assist Transfers: Sit to/from [facility] Sit to Stand: Mod assist Stand pivot transfers: Min assist       General transfer comment: Pt requires boosting assist for sit to stand transfer.  pt able to take steps to transfer to recliner with steadying assist, VCs for taking larger steps as pt with tendency to utilize short, shuffling steps    Balance Overall balance assessment: Needs assistance Sitting-balance support: Feet supported Sitting balance-Leahy Scale: Fair Sitting balance - Comments: mild LOB with dynamic activity, pt able to self correct   Standing balance support: Single extremity supported;During functional activity Standing balance-Leahy Scale: Poor Standing balance comment: Pt reliant on external support                           ADL either performed or assessed with clinical judgement   ADL Overall ADL's : Needs assistance/impaired Eating/Feeding: Modified independent;Sitting Eating/Feeding Details (indicate cue type and reason): pt requiring increased time to open containers, etc but is able to do so on his own - pt with lunch tray in front of him end of session Grooming: Wash/dry hands;Set up;Sitting   Upper Body Bathing: Min guard;Sitting   Lower Body Bathing: Minimal assistance;Sit to/from stand   Upper Body Dressing : Set up;Sitting   Lower Body Dressing: Moderate assistance;Sit to/from stand Lower Body Dressing Details (indicate cue type and reason): minA standing balance Toilet Transfer: Minimal assistance;Stand-pivot Toilet Transfer Details (indicate cue type and reason): simulated via transfer to [place] and Hygiene: Minimal assistance;Moderate assistance;Sit to/from stand Toileting - Clothing Manipulation Details (indicate cue type and reason): pt standing to use urinal at EOB (using urinal due to urgency); requires up to [facility] for standing balance, assist for gown management; pt also reliant on EOB for LE bracing/support     Functional mobility during ADLs:  Minimal assistance(HHA, stand pivot transfer)       Vision Baseline Vision/History: Cataracts Patient Visual Report: No change from  baseline Vision Assessment?: No apparent visual deficits     Perception     Praxis      Pertinent Vitals/Pain Pain Assessment: No/denies pain     Hand Dominance Right   Extremity/Trunk Assessment Upper Extremity Assessment Upper Extremity Assessment: Generalized weakness;RUE deficits/detail;LUE deficits/detail RUE Deficits / Details: increased difficulty with fine motor tasks, but suspect this may be basline LUE Deficits / Details: increased difficulty with fine motor tasks, but suspect this may be basline   Lower Extremity Assessment Lower Extremity Assessment: Defer to PT evaluation   Cervical / Trunk Assessment Cervical / Trunk Assessment: Kyphotic   Communication Communication Communication: No difficulties   Cognition Arousal/Alertness: Awake/alert Behavior During Therapy: WFL for tasks assessed/performed Overall Cognitive Status: Within Functional Limits for tasks assessed                                     General Comments       Exercises     Shoulder Instructions      Home Living Family/patient expects to be discharged to:: Private residence Living Arrangements: Spouse/significant other Available Help at Discharge: Family;Available 24 hours/day Type of Home: House Home Access: Stairs to enter [employer] of Steps: 3 Entrance Stairs-Rails: Can reach both Home Layout: One level     Bathroom Shower/Tub: Occupational psychologist: Handicapped height     Home Equipment: [place] - single point;Walker - 2 wheels;Shower seat - built in;Grab bars - tub/shower      Lives With: Significant other    Prior Functioning/Environment Level of Independence: Needs assistance  Gait / Transfers Assistance Needed: Pt was ambulating without an AD, but reports he intermittently uses a cane. He reports wife helps with mobility sometimes. ADL's / Homemaking Assistance Needed: some assist from spouse for ADL            OT Problem  List: Decreased strength;Decreased range of motion;Decreased activity tolerance;Impaired balance (sitting and/or standing);Decreased knowledge of use of DME or AE;Impaired UE functional use      OT Treatment/Interventions: Self-care/ADL training;Therapeutic exercise;Neuromuscular education;DME and/or AE instruction;Therapeutic activities;Patient/family education;Balance training    OT Goals(Current goals can be found in the care plan section) Acute Rehab OT Goals Patient Stated Goal: hopeful for home today OT Goal Formulation: With patient Time For Goal Achievement: 09/10/18 Potential to Achieve Goals: Good  OT Frequency: Min 2X/week   Barriers to D/C:            Co-evaluation              AM-PAC OT "6 Clicks" Daily Activity     Outcome Measure Help from another person eating meals?: None Help from another person taking care of personal grooming?: A Little Help from another person toileting, which includes using toliet, bedpan, or urinal?: A Lot Help from another person bathing (including washing, rinsing, drying)?: A Lot Help from another person to put on and taking off regular upper body clothing?: A Little Help from another person to put on and taking off regular lower body clothing?: A Lot 6 Click Score: 16   End of Session Nurse Communication: Mobility status  Activity Tolerance: Patient tolerated treatment well Patient left: in chair;with call bell/phone within reach;with chair alarm set;with family/visitor present  OT Visit Diagnosis: Muscle weakness (generalized) (M62.81);Other  abnormalities of gait and mobility (R26.89)                Time: 4090-5025 OT Time Calculation (min): 20 min Charges:  OT General Charges $OT Visit: 1 Visit OT Evaluation $OT Eval Moderate Complexity: [place], OT [employer] Pager [phone] Office [place] 08/27/2018, 3:12 PM

## 2018-08-27 NOTE — Evaluation (Signed)
Speech Language Pathology Evaluation Patient Details Name: Brandon Rojas MRN: [id number] DOB: 31-Jul-1945 Today's Date: 08/27/2018 Time: 3009-2330 SLP Time Calculation (min) (ACUTE ONLY): 25 min  Problem List:  Patient Active Problem List   Diagnosis Date Noted  . TIA (transient ischemic attack) 08/26/2018  . Nocturia 05/30/2018  . Elevated troponin 05/20/2018  . Epigastric pain 05/20/2018  . CAD (coronary artery disease) of artery bypass graft 05/20/2018  . Acute cholecystitis 05/20/2018  . Subacute left lumbar radiculopathy 06/23/2015  . Mitral regurgitation 09/17/2014  . Parkinson's disease ([place]) 08/19/2014  . Pulmonary hypertension ([place]) 03/30/2014  . Piriformis syndrome of left side 03/02/2014  . Peripheral edema 01/15/2014  . Movement disorder 01/15/2014  . Hypercalcemia 01/15/2014  . Left hip pain 01/15/2014  . Hoarseness 01/15/2014  . Back pain 06/08/2010  . Encounter for long-term (current) use of high-risk medication 06/08/2010  . ED (erectile dysfunction) 06/08/2010  . Preventative health care 06/07/2010  . SHOULDER PAIN, LEFT 08/30/2009  . CAD, AUTOLOGOUS BYPASS GRAFT 03/31/2009  . CAROTID ARTERY DISEASE 01/25/2009  . BRADYCARDIA 10/08/2007  . COLONIC POLYPS 03/20/2007  . HIATAL HERNIA 03/20/2007  . DIVERTICULOSIS, COLON 03/20/2007  . Hyperlipidemia 09/19/2006  . OBESITY 09/19/2006  . Anxiety state 09/19/2006  . ERECTILE DYSFUNCTION 09/19/2006  . Depression 09/19/2006  . Essential hypertension 09/19/2006  . Coronary atherosclerosis 09/19/2006  . ALLERGIC RHINITIS 09/19/2006  . GERD 09/19/2006  . OSTEOARTHRITIS 09/19/2006  . TRANSIENT ISCHEMIC ATTACK, HX OF 09/19/2006  . PSORIASIS, HX OF 09/19/2006   Past Medical History:  Past Medical History:  Diagnosis Date  . Anxiety state, unspecified   . Arthritis   . Benign neoplasm of colon   . CAD (coronary artery disease) of artery bypass graft 05/20/2018  . Coronary atherosclerosis of unspecified type of  vessel, native or graft   . Depressive disorder, not elsewhere classified   . Diaphragmatic hernia without mention of obstruction or gangrene   . Diverticulosis of colon (without mention of hemorrhage)   . Elevated troponin 05/20/2018  . Epigastric pain 05/20/2018  . Esophageal reflux   . Esophageal stricture   . Hiatal hernia   . Mitral regurgitation   . Osteoarthrosis, unspecified whether generalized or localized, unspecified site   . Other and unspecified hyperlipidemia   . Parkinson's disease ([place])   . Personal history of colonic polyps   . Personal history of unspecified circulatory disease   . Unspecified essential hypertension    Past Surgical History:  Past Surgical History:  Procedure Laterality Date  . COLONOSCOPY    . CORONARY ARTERY BYPASS GRAFT     x 2  . INGUINAL HERNIA REPAIR     bilateral  . IR PERC CHOLECYSTOSTOMY  05/22/2018  . IR RADIOLOGIST EVAL & MGMT  06/24/2018  . IR RADIOLOGIST EVAL & MGMT  07/01/2018  . LUNG BIOPSY    . POLYPECTOMY    . PTCA     HPI:  Pt is a 73 y.o. male with hx of hypertension, hyperlipidemia, CAD s/p CABG, mitral regurgitation, GERD, hiatal hernia, anxiety, depression, and parkinson's who presented with right arm tingling and slurred speech. MRI of the brain was negative for acute changes but showed right chronic right thalamic lacunar infarct.   Assessment / Plan / Recommendation Clinical Impression  Pt reported that he was living independently prior to admission. He denied any baseline or acute changes in language or cognition. Pt stated that his motor speech skills have returned to baseline. His language skills are currently within  normal limits and no overt cognitive deficits were noted. 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 Pt reported that he has noted a progressive decline in his vocal  intensity and vocal quality since he was diagnosed with Parkinson's disease in August, 2016. Pt expressed that individuals have been having increased difficulty understanding him and that this has been frustrating. He was educated regarding LSVT LOUD treatment and expressed interest in participating in this as an outpatient but verbalized understanding that an otolaryngologic evaluation is typically required prior to initiation of this treatment to assess the structural integrity of the larynx. Pt expressed that he will follow up with his PCP regarding this and stated that had not heard of this therapy prior. However, upon further chart review, it appears that the pt was referred for LSVT LOUD in January, 2019 and had received treatment. Pt and nursing were educated regarding results and recommendations and both parties verbalized understanding as well as agreement with plan of care. Further acute SLP services are not clinically indicated at this time.     SLP Assessment  SLP Recommendation/Assessment: (P) Patient needs continued Speech Lanaguage Pathology Services SLP Visit Diagnosis: Dysarthria and anarthria (R47.1)    Follow Up Recommendations  Other (comment)(Consider outpatient SLP services for LSVT LOUD intervention follow-up)    Frequency and Duration           SLP Evaluation Cognition  Overall Cognitive Status: Within Functional Limits for tasks assessed Arousal/Alertness: Awake/alert Orientation Level: Oriented X4 Attention: Focused Focused Attention: Appears intact Memory: Impaired Memory Impairment: Retrieval deficit;Storage deficit(Immediate: 3/3; delayed: 0/3 with cues: 3/3) Awareness: Appears intact Problem Solving: Appears intact Executive Function: Reasoning Reasoning: Appears intact       Comprehension  Auditory Comprehension Overall Auditory Comprehension: Appears within functional limits for tasks assessed Yes/No Questions: Within Functional Limits Basic  Biographical Questions: (5/5) Complex Questions: (4/5) Paragraph Comprehension (via yes/no questions): (2/4) Commands: Within Functional Limits Conversation: Complex Visual Recognition/Discrimination Discrimination: Within Function Limits Reading Comprehension Reading Status: Not tested    Expression Expression Primary Mode of Expression: Verbal Verbal Expression Overall Verbal Expression: Appears within functional limits for tasks assessed Initiation: No impairment Automatic Speech: Counting;Day of week;Month of year(WNL) Level of Generative/Spontaneous Verbalization: Conversation Repetition: No impairment Naming: No impairment Pragmatics: No impairment   Oral / Surveyor, quantity Overall Motor Speech: Impaired at baseline Respiration: Impaired Level of Impairment: Sentence Phonation: Breathy;Low vocal intensity Resonance: Within functional limits Articulation: Impaired Level of Impairment: Conversation Intelligibility: Intelligibility reduced Word: 75-100% accurate Phrase: 75-100% accurate Sentence: 75-100% accurate Conversation: 75-100% accurate Motor Planning: Witnin functional limits Motor Speech Errors: Aware;Consistent   Keiera Strathman I. Hardin Negus, [place], [place] Office number [phone] Pager [place] 08/27/2018, 11:03 AM

## 2018-08-27 NOTE — Evaluation (Signed)
Physical Therapy Evaluation Patient Details Name: Brandon Rojas MRN: [id number] DOB: 1945/08/19 Today's Date: 08/27/2018   History of Present Illness  Pt is a 73 year old man presenting with R hand and facial numbness, which has since resolved. Per notes, likely TIA. MRI negative for acute abnormality. Relevant PMH includes HTN, hyperlipidemia, CAD s/p CABG, GERD/hiatal hernia, mitral regurgitation, arthritis, and Parkinson's Disease.  Clinical Impression  Pt presents with the above problem and the below deficits. Pt completed mobility tasks at the min G to min A level. Pt required additional time to complete tasks and reported increased stiffness from prolonged laying in bed. Once given time to perform mobility tasks, Pt reports decreased stiffness and increased tolerance with activities. At discharge, Pt will likely require assistance with mobility, that he states his wife can provide. Pt will benefit from skilled acute physical therapy to promote functional independence and safety.     Follow Up Recommendations Home health PT;Supervision for mobility/OOB    Equipment Recommendations  3in1 (PT)    Recommendations for Other Services       Precautions / Restrictions Precautions Precautions: Fall;Other (comment)(cholecystectomy tube) Restrictions Weight Bearing Restrictions: No      Mobility  Bed Mobility Overal bed mobility: Needs Assistance Bed Mobility: Supine to Sit;Sit to Supine     Supine to sit: Min assist Sit to supine: Min assist   General bed mobility comments: Pt requires min A for LE positoning off EOB and for trunk elevation. VCs and tactile cues required for hand placement on railing of bed. Required increased time to perform bed mobility tasks.  Transfers Overall transfer level: Needs assistance Equipment used: Rolling walker (2 wheeled) Transfers: Sit to/from Stand Sit to Stand: Mod assist         General transfer comment: Pt requires lift assist for sit to  stand transfer. VCs and tactile cues provided for hand placement on walker and bed.  Ambulation/Gait Ambulation/Gait assistance: Min guard Gait Distance (Feet): 150 Feet Assistive device: Rolling walker (2 wheeled) Gait Pattern/deviations: Festinating;Decreased step length - right;Decreased step length - left;Step-through pattern;Shuffle;Staggering left;Trunk flexed;Narrow base of support Gait velocity: decreased   General Gait Details: Pt ambulating with step through pattern and demonstrates shuffling and festinating gait which improved with progressed distance. Pt demonstrated B decreased step length. Pt leaning to L during gait suspect due to pt reports of tightness within L hip and knee. Pt required VCs to stay within walker while ambulating.  Stairs            Wheelchair Mobility    Modified Rankin (Stroke Patients Only)       Balance Overall balance assessment: Needs assistance Sitting-balance support: Feet supported Sitting balance-Leahy Scale: Fair     Standing balance support: Bilateral upper extremity supported Standing balance-Leahy Scale: Poor Standing balance comment: Pt reliant on external support including UE support.                             Pertinent Vitals/Pain Pain Assessment: No/denies pain    Home Living Family/patient expects to be discharged to:: Private residence Living Arrangements: Spouse/significant other Available Help at Discharge: Family;Available 24 hours/day Type of Home: House Home Access: Stairs to enter Entrance Stairs-Rails: Can reach both Entrance Stairs-Number of Steps: 3 Home Layout: One level Home Equipment: Cane - single point;Walker - 2 wheels;Shower seat      Prior Function Level of Independence: Needs assistance   Gait / Transfers Assistance Needed: Pt was  ambulating without an AD, but reports he intermittently uses a cane. He reports wife helps with mobility sometimes.           Hand Dominance         Extremity/Trunk Assessment   Upper Extremity Assessment Upper Extremity Assessment: Defer to OT evaluation    Lower Extremity Assessment Lower Extremity Assessment: Generalized weakness;LLE deficits/detail LLE Deficits / Details: Pt reports hx of arthritis, with difficulties in L>R LE. Pain and arthritis present in L knee and hip region.    Cervical / Trunk Assessment Cervical / Trunk Assessment: Kyphotic  Communication   Communication: No difficulties  Cognition Arousal/Alertness: Awake/alert Behavior During Therapy: WFL for tasks assessed/performed Overall Cognitive Status: Within Functional Limits for tasks assessed                                        General Comments      Exercises     Assessment/Plan    PT Assessment Patient needs continued PT services  PT Problem List Decreased strength;Decreased range of motion;Decreased activity tolerance;Decreased balance;Decreased mobility;Decreased knowledge of use of DME       PT Treatment Interventions DME instruction;Gait training;Stair training;Functional mobility training;Therapeutic activities;Therapeutic exercise;Balance training;Patient/family education    PT Goals (Current goals can be found in the Care Plan section)  Acute Rehab PT Goals Patient Stated Goal: walk without tightness PT Goal Formulation: With patient Time For Goal Achievement: 09/10/18 Potential to Achieve Goals: Good    Frequency Min 3X/week   Barriers to discharge        Co-evaluation               AM-PAC PT "6 Clicks" Mobility  Outcome Measure Help needed turning from your back to your side while in a flat bed without using bedrails?: A Little Help needed moving from lying on your back to sitting on the side of a flat bed without using bedrails?: A Little Help needed moving to and from a bed to a chair (including a wheelchair)?: A Little Help needed standing up from a chair using your arms (e.g., wheelchair or  bedside chair)?: A Lot Help needed to walk in hospital room?: A Little Help needed climbing 3-5 steps with a railing? : A Lot 6 Click Score: 16    End of Session Equipment Utilized During Treatment: Gait belt Activity Tolerance: Patient tolerated treatment well Patient left: in bed;with call bell/phone within reach;with bed alarm set Nurse Communication: Mobility status PT Visit Diagnosis: Unsteadiness on feet (R26.81);Other abnormalities of gait and mobility (R26.89);Muscle weakness (generalized) (M62.81);Difficulty in walking, not elsewhere classified (R26.2)    Time: 4128-7867 PT Time Calculation (min) (ACUTE ONLY): 28 min   Charges:   PT Evaluation $PT Eval Low Complexity: 1 Low PT Treatments $Gait Training: 8-22 mins        Christophe Louis, SPT  Hezekiah Veltre 08/27/2018, 2:15 PM

## 2018-08-27 NOTE — TOC Initial Note (Signed)
Transition of Care [facility]) - Initial/Assessment Note    Patient Details  Name: Brandon Rojas MRN: [id number] Date of Birth: [birth year]  [facility]) CM/SW Contact:    Pollie Friar, RN Phone Number: 08/27/2018, 11:52 AM  Clinical Narrative:                 Pt denies issues with transportation or with home medications. TOC following for d/c needs.   Expected Discharge Plan: Acute to Acute Transfer Barriers to Discharge: Continued Medical Work up   Patient Goals and CMS Choice        Expected Discharge Plan and Services Expected Discharge Plan: Acute to Acute Transfer   Discharge Planning Services: CM Consult   Living arrangements for the past 2 months: Single Family Home Expected Discharge Date: 08/27/18                                    Prior Living Arrangements/Services Living arrangements for the past 2 months: Single Family Home Lives with:: Spouse Patient language and need for interpreter reviewed:: Yes(no needs) Do you feel safe going back to the place where you live?: Yes        Care giver support system in place?: Yes (comment)(wife is able to provide 24 hour supervision) Current home services: DME(cane, walker, shower seat) Criminal Activity/Legal Involvement Pertinent to Current Situation/Hospitalization: No - Comment as needed  Activities of Daily Living Home Assistive Devices/Equipment: None ADL Screening (condition at time of admission) Patient's cognitive ability adequate to safely complete daily activities?: Yes Is the patient deaf or have difficulty hearing?: No Does the patient have difficulty seeing, even when wearing glasses/contacts?: No Does the patient have difficulty concentrating, remembering, or making decisions?: No Patient able to express need for assistance with ADLs?: Yes Does the patient have difficulty dressing or bathing?: No Independently performs ADLs?: Yes (appropriate for developmental age) Does the patient  have difficulty walking or climbing stairs?: No Weakness of Legs: None Weakness of Arms/Hands: None  Permission Sought/Granted                  Emotional Assessment   Attitude/Demeanor/Rapport: Engaged Affect (typically observed): Accepting, Pleasant Orientation: : Oriented to Self, Oriented to Place, Oriented to  Time, Oriented to Situation   Psych Involvement: No (comment)  Admission diagnosis:  TIA (transient ischemic attack) [G45.9] Patient Active Problem List   Diagnosis Date Noted  . TIA (transient ischemic attack) 08/26/2018  . Nocturia 05/30/2018  . Elevated troponin 05/20/2018  . Epigastric pain 05/20/2018  . CAD (coronary artery disease) of artery bypass graft 05/20/2018  . Acute cholecystitis 05/20/2018  . Subacute left lumbar radiculopathy 06/23/2015  . Mitral regurgitation 09/17/2014  . Parkinson's disease ([place]) 08/19/2014  . Pulmonary hypertension ([place]) 03/30/2014  . Piriformis syndrome of left side 03/02/2014  . Peripheral edema 01/15/2014  . Movement disorder 01/15/2014  . Hypercalcemia 01/15/2014  . Left hip pain 01/15/2014  . Hoarseness 01/15/2014  . Back pain 06/08/2010  . Encounter for long-term (current) use of high-risk medication 06/08/2010  . ED (erectile dysfunction) 06/08/2010  . Preventative health care 06/07/2010  . SHOULDER PAIN, LEFT 08/30/2009  . CAD, AUTOLOGOUS BYPASS GRAFT 03/31/2009  . CAROTID ARTERY DISEASE 01/25/2009  . BRADYCARDIA 10/08/2007  . COLONIC POLYPS 03/20/2007  . HIATAL HERNIA 03/20/2007  . DIVERTICULOSIS, COLON 03/20/2007  . Hyperlipidemia 09/19/2006  . OBESITY 09/19/2006  . Anxiety state 09/19/2006  .  ERECTILE DYSFUNCTION 09/19/2006  . Depression 09/19/2006  . Essential hypertension 09/19/2006  . Coronary atherosclerosis 09/19/2006  . ALLERGIC RHINITIS 09/19/2006  . GERD 09/19/2006  . OSTEOARTHRITIS 09/19/2006  . TRANSIENT ISCHEMIC ATTACK, HX OF 09/19/2006  . PSORIASIS, HX OF 09/19/2006   PCP:  Biagio Borg, MD Pharmacy:   [place], [place] [place] [address] Phone: [phone] Fax: [place], [place] [address] Phone: [phone] Fax: [phone]     Social Determinants of Health (SDOH) Interventions    Readmission Risk Interventions No flowsheet data found.

## 2018-08-27 NOTE — Consult Note (Signed)
Neurology Consultation Reason for Consult: TIA Referring Physician: Demaris Callander  CC: Right hand and face numbness  History is obtained from: Patient  HPI: Brandon Rojas is a 73 y.o. male with a history of coronary artery disease, Parkinson's disease, CAD who presents with transient right hand and facial numbness and slurred speech that lasted for approximately 15 minutes.  He has had difficulties with his gallbladder and has a percutaneous drain which is been present since May.  He was scheduled to undergo cholecystectomy tomorrow and for this reason had his aspirin Plavix held.  He was doing fine until this evening when he developed right hand and facial numbness and slurred speech.  At the current time this is completely resolved.   LKW: 7:15 PM tpa given?: no, resolution of symptoms    ROS: A 14 point ROS was performed and is negative except as noted in the HPI.   Past Medical History:  Diagnosis Date  . Anxiety state, unspecified   . Arthritis   . Benign neoplasm of colon   . CAD (coronary artery disease) of artery bypass graft 05/20/2018  . Coronary atherosclerosis of unspecified type of vessel, native or graft   . Depressive disorder, not elsewhere classified   . Diaphragmatic hernia without mention of obstruction or gangrene   . Diverticulosis of colon (without mention of hemorrhage)   . Elevated troponin 05/20/2018  . Epigastric pain 05/20/2018  . Esophageal reflux   . Esophageal stricture   . Hiatal hernia   . Mitral regurgitation   . Osteoarthrosis, unspecified whether generalized or localized, unspecified site   . Other and unspecified hyperlipidemia   . Parkinson's disease ([place])   . Personal history of colonic polyps   . Personal history of unspecified circulatory disease   . Unspecified essential hypertension      Family History  Problem Relation Age of Onset  . Heart attack Mother   . Stroke Father   . Colon cancer Sister   . Healthy Sister   . Heart disease  Brother   . Healthy Brother   . Healthy Brother   . Healthy Brother   . Healthy Daughter   . Fibromyalgia Daughter   . Multiple sclerosis Son      Social History:  reports that he quit smoking about 46 years ago. He has never used smokeless tobacco. He reports that he does not drink alcohol or use drugs.   Exam: Current vital signs: BP (!) 187/103 (BP Location: Left Arm)   Pulse 67   Temp 98.9 F (37.2 C) (Oral)   Resp 14   Ht 5\' 4"  (1.626 m)   Wt 66.7 kg   SpO2 98%   BMI 25.24 kg/m  Vital signs in last 24 hours: Temp:  [97.9 F (36.6 C)-98.9 F (37.2 C)] 98.9 F (37.2 C) (08/19 0025) Pulse Rate:  [55-69] 67 (08/19 0025) Resp:  [11-20] 14 (08/19 0025) BP: (154-204)/(78-103) 187/103 (08/19 0025) SpO2:  [94 %-98 %] 98 % (08/19 0025) Weight:  [66.7 kg] 66.7 kg (08/19 0025)   Physical Exam  Constitutional: Appears well-developed and well-nourished.  Psych: Affect appropriate to situation Eyes: No scleral injection HENT: No OP obstrucion Head: Normocephalic.  Cardiovascular: Normal rate and regular rhythm.  Respiratory: Effort normal, non-labored breathing GI: Soft.  No distension. There is no tenderness.  Skin: WDI  Neuro: Mental Status: Patient is awake, alert, oriented to person, place, month, year, and situation. Patient is able to give a clear and coherent history. No  signs of aphasia or neglect Cranial Nerves: II: Visual Fields are full. Pupils are equal, round, and reactive to light.   III,IV, VI: EOMI without ptosis or diploplia.  V: Facial sensation is symmetric to temperature VII: Facial movement with lag on smile on the left VIII: hearing is intact to voice X: Uvula elevates symmetrically XI: Shoulder shrug is symmetric. XII: tongue is midline without atrophy or fasciculations.  Motor: He has good strength throughout without drift. Sensory: Sensation is symmetric to light touch and temperature in the arms and legs. Deep Tendon  Reflexes: Slightly hyperreflexic at the knees Cerebellar: No ataxia finger-nose-finger   I have reviewed labs in epic and the results pertinent to this consultation are: CMP-unremarkable  I have reviewed the images obtained: CT head is unremarkable  Impression: 73 year old male with transient ischemic attack in the setting of holding antiplatelets for his surgery.  He has been started on aspirin, will continue this as monotherapy while his surgical options are reevaluated.  If surgery is to be held, then would consider restarting Plavix, but if going to proceed then aspirin monotherapy would be reasonable.  He has been admitted to evaluate other stroke risk factors and provide secondary risk factor modification as needed.  Recommendations: - HgbA1c, fasting lipid panel - MRI, MRA  of the brain without contrast - Frequent neuro checks - Echocardiogram - Carotid dopplers - Prophylactic therapy-Antiplatelet med: Aspirin - dose 325mg  PO or 300mg  PR - Risk factor modification - Telemetry monitoring - PT consult, OT consult, Speech consult - Stroke team to follow    Roland Rack, MD [facility] [phone]  If 7pm- 7am, please page neurology on call as listed in [place].

## 2018-08-27 NOTE — Progress Notes (Signed)
OT Cancellation Note  Patient Details Name: Brandon Rojas MRN: [id number] DOB: 01-02-1946   Cancelled Treatment:    Reason Eval/Treat Not Completed: Patient at procedure or test/ unavailable; will follow up for OT eval as able.  Lou Cal, OT [facility] Pager [phone] Office [place] 08/27/2018, 11:41 AM

## 2018-08-27 NOTE — H&P (Addendum)
TRH H&P    Patient Demographics:    Brandon Rojas, is a 73 y.o. male  MRN: [id number]  DOB - 10-18-1945  Admit Date - 08/26/2018  Referring MD/NP/PA:  Francia Greaves  Outpatient Primary MD for the patient is Biagio Borg, MD  Patient coming from:   home  Chief complaint- right arm tingling   HPI:    Brandon Rojas  is a 73 y.o. male, w hypertension, hyperlipidemia, CAD s/p CABG, mitral regurgitation, gerd, / hiatal hernia, anxiety/ depression, parkinsons,  w recent admission 05/20/2018 for acute cholecystitis s/p percutaneous chole  05/22/2018, now off plavix for the past 5 days in anticipation of cholecystectomy apparently presents with c/o right arm tingling and right lip tingling starting 7:15 pm,  Pt states had slurred speech as well.  Pt states that his symptoms lasted about 15 minutes.  Pt denies headache, cough, cp, palp, sob, n/v, focal neurological weakness.   In ED,  T 97.9  p 66 R 20 Bp 184/86  Pox 96% on RA  CXR  IMPRESSION: No active cardiopulmonary disease.  CT brain IMPRESSION: No acute abnormality.  Chronic microvascular ischemic change.  Wbc 7.6, Hgb 14.7, Plt 259 Na 140, K 4.1, Bun 14, Creatinine 0.99 Ast 26, Alt 6 Urinalysis negative UDS negative  ED consulted neurology who requested hospitalist admission to [facility]  ED consulted surgery and awaiting response on whether pt can be loaded with plavix  Pt will be admitted for w/up of TIA.     Review of systems:    In addition to the HPI above,  No Fever-chills, No Headache, No changes with Vision or hearing, No problems swallowing food or Liquids, No Chest pain, Cough or Shortness of Breath, No Abdominal pain, No Nausea or Vomiting, bowel movements are regular, No Blood in stool or Urine, No dysuria, No new skin rashes or bruises, No new joints pains-aches,  , No recent weight gain or loss, No polyuria, polydypsia or polyphagia,  No significant Mental Stressors.  All other systems reviewed and are negative.    Past History of the following :    Past Medical History:  Diagnosis Date  . Anxiety state, unspecified   . Arthritis   . Benign neoplasm of colon   . CAD (coronary artery disease) of artery bypass graft 05/20/2018  . Coronary atherosclerosis of unspecified type of vessel, native or graft   . Depressive disorder, not elsewhere classified   . Diaphragmatic hernia without mention of obstruction or gangrene   . Diverticulosis of colon (without mention of hemorrhage)   . Elevated troponin 05/20/2018  . Epigastric pain 05/20/2018  . Esophageal reflux   . Esophageal stricture   . Hiatal hernia   . Mitral regurgitation   . Osteoarthrosis, unspecified whether generalized or localized, unspecified site   . Other and unspecified hyperlipidemia   . Parkinson's disease ([place])   . Personal history of colonic polyps   . Personal history of unspecified circulatory disease   . Unspecified essential hypertension       Past Surgical History:  Procedure  Laterality Date  . COLONOSCOPY    . CORONARY ARTERY BYPASS GRAFT     x 2  . INGUINAL HERNIA REPAIR     bilateral  . IR PERC CHOLECYSTOSTOMY  05/22/2018  . IR RADIOLOGIST EVAL & MGMT  06/24/2018  . IR RADIOLOGIST EVAL & MGMT  07/01/2018  . LUNG BIOPSY    . POLYPECTOMY    . PTCA        Social History:      Social History   Tobacco Use  . Smoking status: Former Smoker    Quit date: 02/19/1972    Years since quitting: 46.5  . Smokeless tobacco: Never Used  Substance Use Topics  . Alcohol use: No    Alcohol/week: 0.0 standard drinks       Family History :     Family History  Problem Relation Age of Onset  . Heart attack Mother   . Stroke Father   . Colon cancer Sister   . Healthy Sister   . Heart disease Brother   . Healthy Brother   . Healthy Brother   . Healthy Brother   . Healthy Daughter   . Fibromyalgia Daughter   . Multiple sclerosis  Son        Home Medications:   Prior to Admission medications   Medication Sig Start Date End Date Taking? Authorizing Provider  B Complex Vitamins (VITAMIN B COMPLEX PO) Take 1 tablet by mouth every other day.    Yes [provider]  carbidopa-levodopa (SINEMET IR) 25-100 MG tablet Take 1 tablet by mouth 3 (three) times daily. 05/06/18  Yes Tat, Eustace Quail, DO  esomeprazole (NEXIUM) 40 MG capsule TAKE 1 CAPSULE BY MOUTH ONCE DAILY Patient taking differently: Take 40 mg by mouth daily.  08/18/18  Yes Biagio Borg, MD  famotidine (PEPCID) 20 MG tablet Take 1 tablet (20 mg total) by mouth 2 (two) times daily. Patient taking differently: Take 20 mg by mouth 2 (two) times daily as needed for heartburn.  05/30/18  Yes Biagio Borg, MD  lovastatin (MEVACOR) 20 MG tablet TAKE 1 TABLET AT BEDTIME Patient taking differently: Take 20 mg by mouth at bedtime.  04/29/18  Yes Biagio Borg, MD  Multiple Vitamin (MULTIVITAMIN WITH MINERALS) TABS tablet Take 1 tablet by mouth daily.   Yes [provider]  NITROSTAT 0.4 MG SL tablet PLACE 1 TABLET (0.4 MG TOTAL) UNDER THE TONGUE EVERY 5 (FIVE) MINUTES AS NEEDED. Patient taking differently: Place 0.4 mg under the tongue every 5 (five) minutes as needed for chest pain.  09/21/14  Yes Minus Breeding, MD  tetrahydrozoline-zinc (VISINE-AC) 0.05-0.25 % ophthalmic solution Place 2 drops into both eyes 3 (three) times daily as needed (for burning or eye redness).   Yes [provider]  tiZANidine (ZANAFLEX) 2 MG tablet Take 1 tablet (2 mg total) by mouth every 6 (six) hours as needed for muscle spasms. 11/12/17  Yes Biagio Borg, MD  traMADol (ULTRAM) 50 MG tablet Take 1 tablet (50 mg total) by mouth every 6 (six) hours as needed. for pain Patient taking differently: Take 50 mg by mouth every 6 (six) hours as needed (for pain).  05/13/18  Yes Biagio Borg, MD  aspirin 81 MG tablet Take 81 mg by mouth daily.      [provider]   clopidogrel (PLAVIX) 75 MG tablet TAKE 1 TABLET EVERY DAY WITH BREAKFAST Patient taking differently: Take 75 mg by mouth daily.  04/29/18  Biagio Borg, MD     Allergies:     Allergies  Allergen Reactions  . Atorvastatin Other (See Comments)    Muscle cramps  . Simvastatin Other (See Comments)    Muscle cramps     Physical Exam:   Vitals  Blood pressure (!) 187/103, pulse 67, temperature 98.9 F (37.2 C), temperature source Oral, resp. rate 14, height 5\' 4"  (1.626 m), weight 66.7 kg, SpO2 98 %.  1.  General: aoxo3  2. Psychiatric: euthymic  3. Neurologic: + resting tremor cn2-12 intact, reflexes 2+ symmetric, diffuse with upgoing toe on the right and down going toe on the left.  Motor 5/5 in all 4 ext  4. HEENMT:  Anicteric, pupils 1.61mm symmetric, direct, consensual, near intact Neck: no jvd  5. Respiratory : CTAB  6. Cardiovascular : rrr s1, s2, 2/6 sem apex  7. Gastrointestinal:  Abd: soft, nt, nd, +bs Perc chole in place  8. Skin:  Ext: no c/c/e,  No rash  9.Musculoskeletal:  Good ROM,  No adenopathy    Data Review:    CBC Recent Labs  Lab 08/22/18 0941 08/26/18 2103 08/26/18 2110  WBC 9.0 7.6  --   HGB 15.0 14.7 15.3  HCT 47.3 44.4 45.0  PLT 255 259  --   MCV 94.4 92.9  --   MCH 29.9 30.8  --   MCHC 31.7 33.1  --   RDW 13.7 13.6  --   LYMPHSABS 1.3 1.2  --   MONOABS 0.6 0.7  --   EOSABS 0.3 0.3  --   BASOSABS 0.1 0.1  --    ------------------------------------------------------------------------------------------------------------------  Results for orders placed or performed during the hospital encounter of 08/26/18 (from the past 48 hour(s))  CBG monitoring, ED     Status: Abnormal   Collection Time: 08/26/18  8:54 PM  Result Value Ref Range   Glucose-Capillary 121 (H) 70 - 99 mg/dL  Ethanol     Status: None   Collection Time: 08/26/18  9:03 PM  Result Value Ref Range   Alcohol, Ethyl (B) <10 <10 mg/dL    Comment: (NOTE)  Lowest detectable limit for serum alcohol is 10 mg/dL. For medical purposes only. Performed at [facility], [place] [address] [place], [place] [address]   Protime-INR     Status: None   Collection Time: 08/26/18  9:03 PM  Result Value Ref Range   Prothrombin Time 11.7 11.4 - 15.2 seconds   INR 0.9 0.8 - 1.2    Comment: (NOTE) INR goal varies based on device and disease states. Performed at [facility], [place] [address] [place], [place] [address]   APTT     Status: None   Collection Time: 08/26/18  9:03 PM  Result Value Ref Range   aPTT 34 24 - 36 seconds    Comment: Performed at [facility], [place] [address] [place], [place] [address]  CBC     Status: None   Collection Time: 08/26/18  9:03 PM  Result Value Ref Range   WBC 7.6 4.0 - 10.5 K/uL   RBC 4.78 4.22 - 5.81 MIL/uL   Hemoglobin 14.7 13.0 - 17.0 g/dL   HCT 44.4 39.0 - 52.0 %   MCV 92.9 80.0 - 100.0 fL   MCH 30.8 26.0 - 34.0 pg   MCHC 33.1 30.0 - 36.0 g/dL   RDW 13.6 11.5 - 15.5 %   Platelets 259 150 - 400 K/uL   nRBC  0.0 0.0 - 0.2 %    Comment: Performed at [facility], [place] [address], [place], [place] [address]  Differential     Status: None   Collection Time: 08/26/18  9:03 PM  Result Value Ref Range   Neutrophils Relative % 70 %   Neutro Abs 5.3 1.7 - 7.7 K/uL   Lymphocytes Relative 16 %   Lymphs Abs 1.2 0.7 - 4.0 K/uL   Monocytes Relative 9 %   Monocytes Absolute 0.7 0.1 - 1.0 K/uL   Eosinophils Relative 4 %   Eosinophils Absolute 0.3 0.0 - 0.5 K/uL   Basophils Relative 1 %   Basophils Absolute 0.1 0.0 - 0.1 K/uL   Immature Granulocytes 0 %   Abs Immature Granulocytes 0.02 0.00 - 0.07 K/uL    Comment: Performed at [facility], [place] [address], [place], [place] [address]  Comprehensive metabolic panel     Status: Abnormal   Collection Time: 08/26/18  9:03 PM  Result Value Ref Range   Sodium 140 135 - 145 mmol/L    Potassium 4.1 3.5 - 5.1 mmol/L   Chloride 107 98 - 111 mmol/L   CO2 23 22 - 32 mmol/L   Glucose, Bld 117 (H) 70 - 99 mg/dL   BUN 14 8 - 23 mg/dL   Creatinine, Ser 0.99 0.61 - 1.24 mg/dL   Calcium 9.3 8.9 - 10.3 mg/dL   Total Protein 7.2 6.5 - 8.1 g/dL   Albumin 3.7 3.5 - 5.0 g/dL   AST 26 15 - 41 U/L   ALT 6 0 - 44 U/L   Alkaline Phosphatase 75 38 - 126 U/L   Total Bilirubin 0.4 0.3 - 1.2 mg/dL   GFR calc non Af Amer >60 >60 mL/min   GFR calc Af Amer >60 >60 mL/min   Anion gap 10 5 - 15    Comment: Performed at [facility], [place] [address] [place], [place] [address]  Urine rapid drug screen (hosp performed)     Status: None   Collection Time: 08/26/18  9:03 PM  Result Value Ref Range   Opiates NONE DETECTED NONE DETECTED   Cocaine NONE DETECTED NONE DETECTED   Benzodiazepines NONE DETECTED NONE DETECTED   Amphetamines NONE DETECTED NONE DETECTED   Tetrahydrocannabinol NONE DETECTED NONE DETECTED   Barbiturates NONE DETECTED NONE DETECTED    Comment: (NOTE) DRUG SCREEN FOR MEDICAL PURPOSES ONLY.  IF CONFIRMATION IS NEEDED FOR ANY PURPOSE, NOTIFY LAB WITHIN 5 DAYS. LOWEST DETECTABLE LIMITS FOR URINE DRUG SCREEN Drug Class                     Cutoff (ng/mL) Amphetamine and metabolites    1000 Barbiturate and metabolites    200 Benzodiazepine                 213 Tricyclics and metabolites     300 Opiates and metabolites        300 Cocaine and metabolites        300 THC                            50 Performed at [facility], [place] [address], [place], [place] [address]   Urinalysis, Routine w reflex microscopic     Status: None   Collection Time: 08/26/18  9:03 PM  Result Value Ref Range   Color, Urine YELLOW YELLOW   APPearance  CLEAR CLEAR   Specific Gravity, Urine 1.011 1.005 - 1.030   pH 6.0 5.0 - 8.0   Glucose, UA NEGATIVE NEGATIVE mg/dL   Hgb urine dipstick NEGATIVE NEGATIVE   Bilirubin Urine NEGATIVE NEGATIVE   Ketones, ur  NEGATIVE NEGATIVE mg/dL   Protein, ur NEGATIVE NEGATIVE mg/dL   Nitrite NEGATIVE NEGATIVE   Leukocytes,Ua NEGATIVE NEGATIVE    Comment: Performed at [place] [address] [place], [place] [address]  I-stat chem 8, ED     Status: Abnormal   Collection Time: 08/26/18  9:10 PM  Result Value Ref Range   Sodium 140 135 - 145 mmol/L   Potassium 4.0 3.5 - 5.1 mmol/L   Chloride 104 98 - 111 mmol/L   BUN 14 8 - 23 mg/dL   Creatinine, Ser 1.00 0.61 - 1.24 mg/dL   Glucose, Bld 112 (H) 70 - 99 mg/dL   Calcium, Ion 1.24 1.15 - 1.40 mmol/L   TCO2 26 22 - 32 mmol/L   Hemoglobin 15.3 13.0 - 17.0 g/dL   HCT 45.0 39.0 - 52.0 %  SARS Coronavirus 2 [facility] order, Performed in [facility] hospital lab) Nasopharyngeal Nasopharyngeal Swab     Status: None   Collection Time: 08/26/18 11:34 PM   Specimen: Nasopharyngeal Swab  Result Value Ref Range   SARS Coronavirus 2 NEGATIVE NEGATIVE    Comment: (NOTE) If result is NEGATIVE SARS-CoV-2 target nucleic acids are NOT DETECTED. The SARS-CoV-2 RNA is generally detectable in upper and lower  respiratory specimens during the acute phase of infection. The lowest  concentration of SARS-CoV-2 viral copies this assay can detect is 250  copies / mL. A negative result does not preclude SARS-CoV-2 infection  and should not be used as the sole basis for treatment or other  patient management decisions.  A negative result may occur with  improper specimen collection / handling, submission of specimen other  than nasopharyngeal swab, presence of viral mutation(s) within the  areas targeted by this assay, and inadequate number of viral copies  (<250 copies / mL). A negative result must be combined with clinical  observations, patient history, and epidemiological information. If result is POSITIVE SARS-CoV-2 target nucleic acids are DETECTED. The SARS-CoV-2 RNA is generally detectable in upper and lower  respiratory specimens dur ing the  acute phase of infection.  Positive  results are indicative of active infection with SARS-CoV-2.  Clinical  correlation with patient history and other diagnostic information is  necessary to determine patient infection status.  Positive results do  not rule out bacterial infection or co-infection with other viruses. If result is PRESUMPTIVE POSTIVE SARS-CoV-2 nucleic acids MAY BE PRESENT.   A presumptive positive result was obtained on the submitted specimen  and confirmed on repeat testing.  While 2019 novel coronavirus  (SARS-CoV-2) nucleic acids may be present in the submitted sample  additional confirmatory testing may be necessary for epidemiological  and / or clinical management purposes  to differentiate between  SARS-CoV-2 and other Sarbecovirus currently known to infect humans.  If clinically indicated additional testing with an alternate test  methodology [phone]) is advised. The SARS-CoV-2 RNA is generally  detectable in upper and lower respiratory sp ecimens during the acute  phase of infection. The expected result is Negative. Fact Sheet for Patients:  StrictlyIdeas.no Fact Sheet for Healthcare Providers: BankingDealers.co.za This test is not yet approved or cleared by the [place] FDA and has been authorized for detection and/or diagnosis of SARS-CoV-2 by FDA  under an Emergency Use Authorization (EUA).  This EUA will remain in effect (meaning this test can be used) for the duration of the COVID-19 declaration under Section 564(b)(1) of the Act, 21 U.S.C. section 360bbb-3(b)(1), unless the authorization is terminated or revoked sooner. Performed at [facility], [place] [address] [place], [place] [address]     Chemistries  Recent Labs  Lab 08/22/18 [phone] 08/26/18 2103 08/26/18 2110  NA 140 140 140  K 4.4 4.1 4.0  CL 105 107 104  CO2 28 23  --   GLUCOSE 103* 117* 112*  BUN 12 14 14    CREATININE 0.96 0.99 1.00  CALCIUM 9.6 9.3  --   AST 24 26  --   ALT 8 6  --   ALKPHOS 82 75  --   BILITOT 0.6 0.4  --    ------------------------------------------------------------------------------------------------------------------  ------------------------------------------------------------------------------------------------------------------ GFR: Estimated Creatinine Clearance: 54.3 mL/min (by C-G formula based on SCr of 1 mg/dL). Liver Function Tests: Recent Labs  Lab 08/22/18 0941 08/26/18 2103  AST 24 26  ALT 8 6  ALKPHOS 82 75  BILITOT 0.6 0.4  PROT 7.7 7.2  ALBUMIN 3.9 3.7   No results for input(s): LIPASE, AMYLASE in the last 168 hours. No results for input(s): AMMONIA in the last 168 hours. Coagulation Profile: Recent Labs  Lab 08/22/18 0941 08/26/18 2103  INR 0.9 0.9   Cardiac Enzymes: No results for input(s): CKTOTAL, CKMB, CKMBINDEX, TROPONINI in the last 168 hours. BNP (last 3 results) No results for input(s): PROBNP in the last 8760 hours. HbA1C: No results for input(s): HGBA1C in the last 72 hours. CBG: Recent Labs  Lab 08/26/18 2054  GLUCAP 121*   Lipid Profile: No results for input(s): CHOL, HDL, LDLCALC, TRIG, CHOLHDL, LDLDIRECT in the last 72 hours. Thyroid Function Tests: No results for input(s): TSH, T4TOTAL, FREET4, T3FREE, THYROIDAB in the last 72 hours. Anemia Panel: No results for input(s): VITAMINB12, FOLATE, FERRITIN, TIBC, IRON, RETICCTPCT in the last 72 hours.  --------------------------------------------------------------------------------------------------------------- Urine analysis:    Component Value Date/Time   COLORURINE YELLOW 08/26/2018 2103   APPEARANCEUR CLEAR 08/26/2018 2103   LABSPEC 1.011 08/26/2018 2103   PHURINE 6.0 08/26/2018 2103   GLUCOSEU NEGATIVE 08/26/2018 2103   GLUCOSEU NEGATIVE 05/15/2018 1057   HGBUR NEGATIVE 08/26/2018 2103   BILIRUBINUR NEGATIVE 08/26/2018 2103   KETONESUR NEGATIVE  08/26/2018 2103   PROTEINUR NEGATIVE 08/26/2018 2103   UROBILINOGEN 0.2 05/15/2018 1057   NITRITE NEGATIVE 08/26/2018 2103   LEUKOCYTESUR NEGATIVE 08/26/2018 2103      Imaging Results:    Dg Chest 2 View  Result Date: 08/27/2018 CLINICAL DATA:  TIA, admission EXAM: CHEST - 2 VIEW COMPARISON:  Radiograph May 22, 2018 FINDINGS: Postsurgical changes related to prior CABG including intact and aligned sternotomy wires and multiple surgical clips projecting over the mediastinum. The aorta is calcified. The remaining cardiomediastinal contours are unremarkable. Small hiatal hernia. No consolidation, features of edema, pneumothorax, or effusion. Pulmonary vascularity is normally distributed. No acute osseous or soft tissue abnormality. IMPRESSION: No active cardiopulmonary disease. Electronically Signed   By: Lovena Le M.D.   On: 08/27/2018 00:02   Ct Head Wo Contrast  Result Date: 08/26/2018 CLINICAL DATA:  Onset right arm and facial numbness at 7:15 p.m. today. EXAM: CT HEAD WITHOUT CONTRAST TECHNIQUE: Contiguous axial images were obtained from the base of the skull through the vertex without intravenous contrast. COMPARISON:  None. FINDINGS: Brain: No evidence of acute infarction, hemorrhage, hydrocephalus, extra-axial collection or  mass lesion/mass effect. Chronic microvascular ischemic change is noted. Vascular: Atherosclerosis is identified. Skull: Intact.  No focal lesion. Sinuses/Orbits: Negative. Other: None. IMPRESSION: No acute abnormality. Chronic microvascular ischemic change. Atherosclerosis. Electronically Signed   By: Inge Rise M.D.   On: 08/26/2018 21:27      Assessment & Plan:    Principal Problem:   TIA (transient ischemic attack) Active Problems:   Parkinson's disease ([place])   Mitral regurgitation   CAD (coronary artery disease) of artery bypass graft  TIA Tele MRI brain Carotid utlrasound Cardiac echo Check hga1c, lipid PT/OT/ speech therapy consult Aspirin  324mg  po x1 in ED Aspirin 325mg  po qday Cont Lovastatin 20mg  po qhs Permissive hypertension Awaiting neurology / surgery input regarding plavix Neurology consulted by ED, appreciate input  H/o Acute cholecystitis w recent perc chole 05/22/2018 Please consult surgery regarding whether they plan to persue cholecystectomy  CAD s/p CABG, mitral regurgitation Cont aspirin as above Cont Lovastatin as above Presume not on B blocker due to relative bradycardia  Gerd Cont PPI Cont pepcid  Parkinsons Cont Sinemet 25/100gm po tid    DVT Prophylaxis- - SCDs   AM Labs Ordered, also please review Full Orders  Family Communication: Admission, patients condition and plan of care including tests being ordered have been discussed with the patient who indicate understanding and agree with the plan and Code Status.  Code Status:  FULL CODE  Admission status: Observation: Based on patients clinical presentation and evaluation of above clinical data, I have made determination that patient meets observation criteria at this time.   Time spent in minutes : 55   Jani Gravel M.D on 08/27/2018 at 1:18 AM

## 2018-08-27 NOTE — Progress Notes (Signed)
Patient discharged to private residence with spouse via private vehicle.   To exit by nurse tech via wheelchair.

## 2018-08-27 NOTE — Progress Notes (Signed)
Carotid artery duplex and TCD completed. Refer to "CV Proc" under chart review to view preliminary results.  08/27/2018 11:59 AM Maudry Mayhew, MHA, RVT, RDCS, RDMS

## 2018-08-27 NOTE — Progress Notes (Signed)
Discussed case with Dr. Dema Severin. Patient developed stroke like symptoms/TIA while holding his plavix/aspirin in anticipation for surgery. Would not recommend that he undergo general anesthesia for laparoscopic cholecystectomy at this time. He may restart these medications from our standpoint and discharge home. Keep cholecystostomy tube in place. Will arrange follow up in our office in 3 months to discuss when to reschedule laparoscopic cholecystectomy.  Wellington Hampshire, [facility] 08/27/2018, 11:57 AM Pager: [phone] Mon 7:00 am -11:30 AM Tues-Fri 7:00 am-4:30 pm Sat-Sun 7:00 am-11:30 am

## 2018-08-27 NOTE — TOC Transition Note (Signed)
Transition of Care [facility]) - CM/SW Discharge Note   Patient Details  Name: Brandon Rojas MRN: [id number] Date of Birth: 27-Jan-1945  [facility] CM/SW Contact:  Pollie Friar, RN Phone Number: 08/27/2018, 2:47 PM   Clinical Narrative:    Pt discharging home with [facility] services through [place]. Tiffany with [facility] accepted the referral. Pt refusing 3 in 1. Wife to provide transport home.    Final next level of care: Home w Home Health Services Barriers to Discharge: No Barriers Identified   Patient Goals and CMS Choice   CMS Medicare.gov Compare Post Acute Care list provided to:: Patient Represenative (must comment) Choice offered to / list presented to : Spouse  Discharge Placement                       Discharge Plan and Services   Discharge Planning Services: CM Consult                      HH Arranged: PT, OT, Speech Therapy [place]: [facility] [employer] Date [place]: 08/27/18   Representative spoke with at [place]: [place] Interventions     Readmission Risk Interventions No flowsheet data found.

## 2018-08-28 ENCOUNTER — Telehealth: Payer: Self-pay | Admitting: *Deleted

## 2018-08-28 NOTE — Telephone Encounter (Signed)
Transition Care Management Follow-up Telephone Call  How have you been since you were released from the hospital? I am feeling much better.   Do you understand why you were in the hospital? yes   Do you understand the discharge instrcutions? yes  Items Reviewed:  Medications reviewed: yes  Allergies reviewed: yes  Dietary changes reviewed: yes  Referrals reviewed: yes   Functional Questionnaire:   Activities of Daily Living (ADLs):   He states they are independent in the following: ambulation, feeding, continence, grooming, toileting and dressing States they require assistance with the following: bathing and hygiene   Any transportation issues/concerns?: yes   Any patient concerns? yes   Confirmed importance and date/time of follow-up visits scheduled: yes, 09/04/18   Confirmed with patient if condition begins to worsen call PCP or go to the ER.  Patient was given the Call-a-Nurse line [phone]: yes

## 2018-08-29 DIAGNOSIS — I272 Pulmonary hypertension, unspecified: Secondary | ICD-10-CM | POA: Diagnosis not present

## 2018-08-29 DIAGNOSIS — I10 Essential (primary) hypertension: Secondary | ICD-10-CM | POA: Diagnosis not present

## 2018-08-29 DIAGNOSIS — M5416 Radiculopathy, lumbar region: Secondary | ICD-10-CM | POA: Diagnosis not present

## 2018-08-29 DIAGNOSIS — M1991 Primary osteoarthritis, unspecified site: Secondary | ICD-10-CM | POA: Diagnosis not present

## 2018-08-29 DIAGNOSIS — F411 Generalized anxiety disorder: Secondary | ICD-10-CM | POA: Diagnosis not present

## 2018-08-29 DIAGNOSIS — I25810 Atherosclerosis of coronary artery bypass graft(s) without angina pectoris: Secondary | ICD-10-CM | POA: Diagnosis not present

## 2018-08-29 DIAGNOSIS — F329 Major depressive disorder, single episode, unspecified: Secondary | ICD-10-CM | POA: Diagnosis not present

## 2018-08-29 DIAGNOSIS — G20 Parkinson's disease: Secondary | ICD-10-CM | POA: Diagnosis not present

## 2018-08-29 DIAGNOSIS — L409 Psoriasis, unspecified: Secondary | ICD-10-CM | POA: Diagnosis not present

## 2018-09-01 ENCOUNTER — Telehealth: Payer: Self-pay | Admitting: Internal Medicine

## 2018-09-01 DIAGNOSIS — L409 Psoriasis, unspecified: Secondary | ICD-10-CM | POA: Diagnosis not present

## 2018-09-01 DIAGNOSIS — F411 Generalized anxiety disorder: Secondary | ICD-10-CM | POA: Diagnosis not present

## 2018-09-01 DIAGNOSIS — G20 Parkinson's disease: Secondary | ICD-10-CM | POA: Diagnosis not present

## 2018-09-01 DIAGNOSIS — I25810 Atherosclerosis of coronary artery bypass graft(s) without angina pectoris: Secondary | ICD-10-CM | POA: Diagnosis not present

## 2018-09-01 DIAGNOSIS — F329 Major depressive disorder, single episode, unspecified: Secondary | ICD-10-CM | POA: Diagnosis not present

## 2018-09-01 DIAGNOSIS — M5416 Radiculopathy, lumbar region: Secondary | ICD-10-CM | POA: Diagnosis not present

## 2018-09-01 DIAGNOSIS — M1991 Primary osteoarthritis, unspecified site: Secondary | ICD-10-CM | POA: Diagnosis not present

## 2018-09-01 DIAGNOSIS — I10 Essential (primary) hypertension: Secondary | ICD-10-CM | POA: Diagnosis not present

## 2018-09-01 DIAGNOSIS — I272 Pulmonary hypertension, unspecified: Secondary | ICD-10-CM | POA: Diagnosis not present

## 2018-09-01 NOTE — Telephone Encounter (Signed)
Verbal orders given  

## 2018-09-01 NOTE — Telephone Encounter (Signed)
Home Health Verbal Orders - Caller/Agency: [facility]  Callback Number: [id number] Requesting OT/PT/Skilled Nursing/Social Work/Speech Therapy: speech Frequency: 1 w 4

## 2018-09-03 DIAGNOSIS — G20 Parkinson's disease: Secondary | ICD-10-CM | POA: Diagnosis not present

## 2018-09-03 DIAGNOSIS — I25810 Atherosclerosis of coronary artery bypass graft(s) without angina pectoris: Secondary | ICD-10-CM | POA: Diagnosis not present

## 2018-09-03 DIAGNOSIS — M1991 Primary osteoarthritis, unspecified site: Secondary | ICD-10-CM | POA: Diagnosis not present

## 2018-09-03 DIAGNOSIS — I272 Pulmonary hypertension, unspecified: Secondary | ICD-10-CM | POA: Diagnosis not present

## 2018-09-03 DIAGNOSIS — L409 Psoriasis, unspecified: Secondary | ICD-10-CM | POA: Diagnosis not present

## 2018-09-03 DIAGNOSIS — F329 Major depressive disorder, single episode, unspecified: Secondary | ICD-10-CM | POA: Diagnosis not present

## 2018-09-03 DIAGNOSIS — M5416 Radiculopathy, lumbar region: Secondary | ICD-10-CM | POA: Diagnosis not present

## 2018-09-03 DIAGNOSIS — I10 Essential (primary) hypertension: Secondary | ICD-10-CM | POA: Diagnosis not present

## 2018-09-03 DIAGNOSIS — F411 Generalized anxiety disorder: Secondary | ICD-10-CM | POA: Diagnosis not present

## 2018-09-04 ENCOUNTER — Encounter: Payer: Self-pay | Admitting: Internal Medicine

## 2018-09-04 ENCOUNTER — Other Ambulatory Visit: Payer: Self-pay

## 2018-09-04 ENCOUNTER — Other Ambulatory Visit (INDEPENDENT_AMBULATORY_CARE_PROVIDER_SITE_OTHER): Payer: Medicare PPO

## 2018-09-04 ENCOUNTER — Ambulatory Visit (INDEPENDENT_AMBULATORY_CARE_PROVIDER_SITE_OTHER): Payer: Medicare PPO | Admitting: Internal Medicine

## 2018-09-04 VITALS — BP 118/78 | HR 72 | Temp 98.0°F | Ht 64.0 in | Wt 144.0 lb

## 2018-09-04 DIAGNOSIS — E611 Iron deficiency: Secondary | ICD-10-CM

## 2018-09-04 DIAGNOSIS — E538 Deficiency of other specified B group vitamins: Secondary | ICD-10-CM

## 2018-09-04 DIAGNOSIS — I10 Essential (primary) hypertension: Secondary | ICD-10-CM

## 2018-09-04 DIAGNOSIS — Z0000 Encounter for general adult medical examination without abnormal findings: Secondary | ICD-10-CM

## 2018-09-04 DIAGNOSIS — K810 Acute cholecystitis: Secondary | ICD-10-CM

## 2018-09-04 DIAGNOSIS — F329 Major depressive disorder, single episode, unspecified: Secondary | ICD-10-CM | POA: Diagnosis not present

## 2018-09-04 DIAGNOSIS — E559 Vitamin D deficiency, unspecified: Secondary | ICD-10-CM

## 2018-09-04 DIAGNOSIS — G459 Transient cerebral ischemic attack, unspecified: Secondary | ICD-10-CM

## 2018-09-04 DIAGNOSIS — Z23 Encounter for immunization: Secondary | ICD-10-CM

## 2018-09-04 DIAGNOSIS — F32A Depression, unspecified: Secondary | ICD-10-CM

## 2018-09-04 LAB — URINALYSIS, ROUTINE W REFLEX MICROSCOPIC
Bilirubin Urine: NEGATIVE
Hgb urine dipstick: NEGATIVE
Leukocytes,Ua: NEGATIVE
Nitrite: NEGATIVE
RBC / HPF: NONE SEEN (ref 0–?)
Specific Gravity, Urine: 1.025 (ref 1.000–1.030)
Total Protein, Urine: NEGATIVE
Urine Glucose: NEGATIVE
Urobilinogen, UA: 0.2 (ref 0.0–1.0)
WBC, UA: NONE SEEN (ref 0–?)
pH: 6 (ref 5.0–8.0)

## 2018-09-04 LAB — LIPID PANEL
Cholesterol: 136 mg/dL (ref 0–200)
HDL: 55.4 mg/dL (ref >39.00)
LDL Cholesterol: 56 mg/dL (ref 0–99)
NonHDL: 80.82
Total CHOL/HDL Ratio: 2
Triglycerides: 124 mg/dL (ref 0.0–149.0)
VLDL: 24.8 mg/dL (ref 0.0–40.0)

## 2018-09-04 LAB — HEPATIC FUNCTION PANEL
ALT: 6 U/L (ref 0–53)
AST: 18 U/L (ref 0–37)
Albumin: 4 g/dL (ref 3.5–5.2)
Alkaline Phosphatase: 77 U/L (ref 39–117)
Bilirubin, Direct: 0.1 mg/dL (ref 0.0–0.3)
Total Bilirubin: 0.5 mg/dL (ref 0.2–1.2)
Total Protein: 6.6 g/dL (ref 6.0–8.3)

## 2018-09-04 LAB — CBC WITH DIFFERENTIAL/PLATELET
Basophils Absolute: 0.1 10*3/uL (ref 0.0–0.1)
Basophils Relative: 0.8 % (ref 0.0–3.0)
Eosinophils Absolute: 0.4 10*3/uL (ref 0.0–0.7)
Eosinophils Relative: 5.5 % — ABNORMAL HIGH (ref 0.0–5.0)
HCT: 44.3 % (ref 39.0–52.0)
Hemoglobin: 14.9 g/dL (ref 13.0–17.0)
Lymphocytes Relative: 21.5 % (ref 12.0–46.0)
Lymphs Abs: 1.6 10*3/uL (ref 0.7–4.0)
MCHC: 33.6 g/dL (ref 30.0–36.0)
MCV: 91 fl (ref 78.0–100.0)
Monocytes Absolute: 0.8 10*3/uL (ref 0.1–1.0)
Monocytes Relative: 10.3 % (ref 3.0–12.0)
Neutro Abs: 4.5 10*3/uL (ref 1.4–7.7)
Neutrophils Relative %: 61.9 % (ref 43.0–77.0)
Platelets: 253 10*3/uL (ref 150.0–400.0)
RBC: 4.86 Mil/uL (ref 4.22–5.81)
RDW: 14 % (ref 11.5–15.5)
WBC: 7.3 10*3/uL (ref 4.0–10.5)

## 2018-09-04 LAB — IBC PANEL
Iron: 79 ug/dL (ref 42–165)
Saturation Ratios: 18.1 % — ABNORMAL LOW (ref 20.0–50.0)
Transferrin: 312 mg/dL (ref 212.0–360.0)

## 2018-09-04 LAB — TSH: TSH: 3.02 u[IU]/mL (ref 0.35–4.50)

## 2018-09-04 LAB — BASIC METABOLIC PANEL
BUN: 12 mg/dL (ref 6–23)
CO2: 29 mEq/L (ref 19–32)
Calcium: 9.7 mg/dL (ref 8.4–10.5)
Chloride: 103 mEq/L (ref 96–112)
Creatinine, Ser: 1 mg/dL (ref 0.40–1.50)
GFR: 72.98 mL/min (ref >60.00)
Glucose, Bld: 93 mg/dL (ref 70–99)
Potassium: 4.2 mEq/L (ref 3.5–5.1)
Sodium: 139 mEq/L (ref 135–145)

## 2018-09-04 LAB — VITAMIN B12: Vitamin B-12: 850 pg/mL (ref 211–911)

## 2018-09-04 LAB — VITAMIN D 25 HYDROXY (VIT D DEFICIENCY, FRACTURES): VITD: 37.37 ng/mL (ref 30.00–100.00)

## 2018-09-04 LAB — PSA: PSA: 1.23 ng/mL (ref 0.10–4.00)

## 2018-09-04 NOTE — Assessment & Plan Note (Signed)
stable overall by history and exam, recent data reviewed with pt, and pt to continue medical treatment as before,  to f/u any worsening symptoms or concerns  

## 2018-09-04 NOTE — Patient Instructions (Addendum)
Please continue all other medications as before, and refills have been done if requested.  Please have the pharmacy call with any other refills you may need.  Please continue your efforts at being more active, low cholesterol diet, and weight control.  Please keep your appointments with your specialists as you may have planned  Please return in 6 months, or sooner if needed 

## 2018-09-04 NOTE — Progress Notes (Signed)
Subjective:    Patient ID: Brandon Rojas, male    DOB: [birth year], 73 y.o.   MRN: [id number]  HPI  Here too f/u hospn 8/76 - 51/42; 73 year old man presenting with R hand and facial numbness, which has since resolved. Per notes, likely TIA. MRI negative for acute abnormality. Relevant PMH includes HTN, hyperlipidemia, CAD s/p CABG, GERD/hiatal hernia, mitral regurgitation, arthritis, and Parkinson's Disease. Patient had developed stroke like symptoms/TIA while holding his plavix/aspirin in anticipation for surgery. Pt was not recommend that he undergo general anesthesia for laparoscopic cholecystectomy at this time. He may restart these medications from our standpoint and discharge home. Keep cholecystostomy tube in place. Will arrange follow up in [place] office in 3 months to discuss when to reschedule laparoscopic cholecystectomy.  Pt discharged home with [facility] services through [place] with HH Arranged: PT, OT, Speech TherapyTiffany with [facility] accepted the referral. Pt refusing 3 in 1.  Has appt with Dr Dema Severin sept 4.  Covid tesing neg on aug 18.  Currently, Pt denies chest pain, increased sob or doe, wheezing, orthopnea, PND, increased LE swelling, palpitations, dizziness or syncope.  Pt denies new neurological symptoms such as new headache, or facial or extremity weakness or numbness   Pt denies polydipsia, polyuria   Pt denies fever, wt loss, night sweats, loss of appetite, or other constitutional symptoms Wt Readings from Last 3 Encounters:  09/04/18 144 lb (65.3 kg)  08/27/18 147 lb 0.8 oz (66.7 kg)  08/22/18 141 lb 9.6 oz (64.2 kg)   BP Readings from Last 3 Encounters:  09/04/18 118/78  08/27/18 137/73  08/22/18 (!) 163/82   Past Medical History:  Diagnosis Date  . Anxiety state, unspecified   . Arthritis   . Benign neoplasm of colon   . CAD (coronary artery disease) of artery bypass graft 05/20/2018  . Coronary atherosclerosis of unspecified type of vessel, native or graft    . Depressive disorder, not elsewhere classified   . Diaphragmatic hernia without mention of obstruction or gangrene   . Diverticulosis of colon (without mention of hemorrhage)   . Elevated troponin 05/20/2018  . Epigastric pain 05/20/2018  . Esophageal reflux   . Esophageal stricture   . Hiatal hernia   . Mitral regurgitation   . Osteoarthrosis, unspecified whether generalized or localized, unspecified site   . Other and unspecified hyperlipidemia   . Parkinson's disease ([place])   . Personal history of colonic polyps   . Personal history of unspecified circulatory disease   . Unspecified essential hypertension    Past Surgical History:  Procedure Laterality Date  . COLONOSCOPY    . CORONARY ARTERY BYPASS GRAFT     x 2  . INGUINAL HERNIA REPAIR     bilateral  . IR PERC CHOLECYSTOSTOMY  05/22/2018  . IR RADIOLOGIST EVAL & MGMT  06/24/2018  . IR RADIOLOGIST EVAL & MGMT  07/01/2018  . LUNG BIOPSY    . POLYPECTOMY    . PTCA      reports that he quit smoking about 46 years ago. He has never used smokeless tobacco. He reports that he does not drink alcohol or use drugs. family history includes Colon cancer in his sister; Fibromyalgia in his daughter; Healthy in his brother, brother, brother, daughter, and sister; Heart attack in his mother; Heart disease in his brother; Multiple sclerosis in his son; Stroke in his father. Allergies  Allergen Reactions  . Atorvastatin Other (See Comments)    Muscle cramps  .  Simvastatin Other (See Comments)    Muscle cramps   Current Outpatient Medications on File Prior to Visit  Medication Sig Dispense Refill  . aspirin 81 MG tablet Take 81 mg by mouth daily.      . B Complex Vitamins (VITAMIN B COMPLEX PO) Take 1 tablet by mouth every other day.     . carbidopa-levodopa (SINEMET IR) 25-100 MG tablet Take 1 tablet by mouth 3 (three) times daily. 270 tablet 1  . clopidogrel (PLAVIX) 75 MG tablet TAKE 1 TABLET EVERY DAY WITH BREAKFAST (Patient taking  differently: Take 75 mg by mouth daily. ) 90 tablet 1  . esomeprazole (NEXIUM) 40 MG capsule TAKE 1 CAPSULE BY MOUTH ONCE DAILY (Patient taking differently: Take 40 mg by mouth daily. ) 90 capsule 0  . famotidine (PEPCID) 20 MG tablet Take 1 tablet (20 mg total) by mouth 2 (two) times daily. (Patient taking differently: Take 20 mg by mouth 2 (two) times daily as needed for heartburn. ) 60 tablet 11  . lovastatin (MEVACOR) 20 MG tablet TAKE 1 TABLET AT BEDTIME (Patient taking differently: Take 20 mg by mouth at bedtime. ) 90 tablet 1  . Multiple Vitamin (MULTIVITAMIN WITH MINERALS) TABS tablet Take 1 tablet by mouth daily.    [name] NITROSTAT 0.4 MG SL tablet PLACE 1 TABLET (0.4 MG TOTAL) UNDER THE TONGUE EVERY 5 (FIVE) MINUTES AS NEEDED. (Patient taking differently: Place 0.4 mg under the tongue every 5 (five) minutes as needed for chest pain. ) 25 tablet 5  . tetrahydrozoline-zinc (VISINE-AC) 0.05-0.25 % ophthalmic solution Place 2 drops into both eyes 3 (three) times daily as needed (for burning or eye redness).    [name] tiZANidine (ZANAFLEX) 2 MG tablet Take 1 tablet (2 mg total) by mouth every 6 (six) hours as needed for muscle spasms. 40 tablet 1  . traMADol (ULTRAM) 50 MG tablet Take 1 tablet (50 mg total) by mouth every 6 (six) hours as needed. for pain (Patient taking differently: Take 50 mg by mouth every 6 (six) hours as needed (for pain). ) 120 tablet 5   No current facility-administered medications on file prior to visit.     Review of Systems  Constitutional: Negative for other unusual diaphoresis or sweats HENT: Negative for ear discharge or swelling Eyes: Negative for other worsening visual disturbances Respiratory: Negative for stridor or other swelling  Gastrointestinal: Negative for worsening distension or other blood Genitourinary: Negative for retention or other urinary change Musculoskeletal: Negative for other MSK pain or swelling Skin: Negative for color change or other new lesions  Neurological: Negative for worsening tremors and other numbness  Psychiatric/Behavioral: Negative for worsening agitation or other fatigue All other system neg per pt    Objective:   Physical Exam BP 118/78   Pulse 72   Temp 98 F (36.7 C) (Oral)   Ht 5\' 4"  (1.626 m)   Wt 144 lb (65.3 kg)   SpO2 96%   BMI 24.72 kg/m  VS noted,  Constitutional: Pt appears in NAD HENT: Head: NCAT.  Right Ear: External ear normal.  Left Ear: External ear normal.  Eyes: . Pupils are equal, round, and reactive to light. Conjunctivae and EOM are normal Nose: without d/c or deformity Neck: Neck supple. Gross normal ROM Cardiovascular: Normal rate and regular rhythm.   Pulmonary/Chest: Effort normal and breath sounds without rales or wheezing.  Abd:  Soft, NT, ND, + BS, no organomegaly, tube in RUQ Neurological: Pt is alert. At baseline orientation, motor grossly  intact Skin: Skin is warm. No rashes, other new lesions, no LE edema Psychiatric: Pt behavior is normal without agitation  No other exam findings Lab Results  Component Value Date   WBC 7.6 08/26/2018   HGB 15.3 08/26/2018   HCT 45.0 08/26/2018   PLT 259 08/26/2018   GLUCOSE 112 (H) 08/26/2018   CHOL 149 05/15/2018   TRIG 145.0 05/15/2018   HDL 50.10 05/15/2018   LDLCALC 70 05/15/2018   ALT 6 08/26/2018   AST 26 08/26/2018   NA 140 08/26/2018   K 4.0 08/26/2018   CL 104 08/26/2018   CREATININE 1.00 08/26/2018   BUN 14 08/26/2018   CO2 23 08/26/2018   TSH 2.51 05/15/2018   PSA 1.04 05/15/2018   INR 0.9 08/26/2018        Assessment & Plan:

## 2018-09-04 NOTE — Assessment & Plan Note (Signed)
To f/u general surgury as planned, stable

## 2018-09-08 ENCOUNTER — Telehealth: Payer: Self-pay | Admitting: Internal Medicine

## 2018-09-08 NOTE — Telephone Encounter (Signed)
Verbal orders given via vm. 

## 2018-09-08 NOTE — Telephone Encounter (Signed)
Home Health Verbal Orders - Caller/Agency: Nancy/[facility] Callback Number: [phone] Requesting OT/PT/Skilled Nursing/Social Work/Speech Therapy: OT Frequency: 1w 2    For HEP instructions

## 2018-09-08 NOTE — Telephone Encounter (Signed)
Theora Gianotti ([facility]) called to report a missed visit today 09/08/2018.   Margaretha [id number] [phone]

## 2018-09-08 NOTE — Telephone Encounter (Signed)
Noted  

## 2018-09-12 DIAGNOSIS — I25810 Atherosclerosis of coronary artery bypass graft(s) without angina pectoris: Secondary | ICD-10-CM | POA: Diagnosis not present

## 2018-09-12 DIAGNOSIS — M5416 Radiculopathy, lumbar region: Secondary | ICD-10-CM | POA: Diagnosis not present

## 2018-09-12 DIAGNOSIS — F329 Major depressive disorder, single episode, unspecified: Secondary | ICD-10-CM | POA: Diagnosis not present

## 2018-09-12 DIAGNOSIS — L409 Psoriasis, unspecified: Secondary | ICD-10-CM | POA: Diagnosis not present

## 2018-09-12 DIAGNOSIS — M1991 Primary osteoarthritis, unspecified site: Secondary | ICD-10-CM | POA: Diagnosis not present

## 2018-09-12 DIAGNOSIS — I10 Essential (primary) hypertension: Secondary | ICD-10-CM | POA: Diagnosis not present

## 2018-09-12 DIAGNOSIS — I272 Pulmonary hypertension, unspecified: Secondary | ICD-10-CM | POA: Diagnosis not present

## 2018-09-12 DIAGNOSIS — F411 Generalized anxiety disorder: Secondary | ICD-10-CM | POA: Diagnosis not present

## 2018-09-12 DIAGNOSIS — G20 Parkinson's disease: Secondary | ICD-10-CM | POA: Diagnosis not present

## 2018-09-15 DIAGNOSIS — M5416 Radiculopathy, lumbar region: Secondary | ICD-10-CM | POA: Diagnosis not present

## 2018-09-15 DIAGNOSIS — I272 Pulmonary hypertension, unspecified: Secondary | ICD-10-CM | POA: Diagnosis not present

## 2018-09-15 DIAGNOSIS — I10 Essential (primary) hypertension: Secondary | ICD-10-CM | POA: Diagnosis not present

## 2018-09-15 DIAGNOSIS — F411 Generalized anxiety disorder: Secondary | ICD-10-CM | POA: Diagnosis not present

## 2018-09-15 DIAGNOSIS — I25810 Atherosclerosis of coronary artery bypass graft(s) without angina pectoris: Secondary | ICD-10-CM | POA: Diagnosis not present

## 2018-09-15 DIAGNOSIS — G20 Parkinson's disease: Secondary | ICD-10-CM | POA: Diagnosis not present

## 2018-09-15 DIAGNOSIS — L409 Psoriasis, unspecified: Secondary | ICD-10-CM | POA: Diagnosis not present

## 2018-09-15 DIAGNOSIS — F329 Major depressive disorder, single episode, unspecified: Secondary | ICD-10-CM | POA: Diagnosis not present

## 2018-09-15 DIAGNOSIS — M1991 Primary osteoarthritis, unspecified site: Secondary | ICD-10-CM | POA: Diagnosis not present

## 2018-09-17 DIAGNOSIS — M1991 Primary osteoarthritis, unspecified site: Secondary | ICD-10-CM | POA: Diagnosis not present

## 2018-09-17 DIAGNOSIS — F329 Major depressive disorder, single episode, unspecified: Secondary | ICD-10-CM | POA: Diagnosis not present

## 2018-09-17 DIAGNOSIS — L409 Psoriasis, unspecified: Secondary | ICD-10-CM | POA: Diagnosis not present

## 2018-09-17 DIAGNOSIS — G20 Parkinson's disease: Secondary | ICD-10-CM | POA: Diagnosis not present

## 2018-09-17 DIAGNOSIS — I25810 Atherosclerosis of coronary artery bypass graft(s) without angina pectoris: Secondary | ICD-10-CM | POA: Diagnosis not present

## 2018-09-17 DIAGNOSIS — I272 Pulmonary hypertension, unspecified: Secondary | ICD-10-CM | POA: Diagnosis not present

## 2018-09-17 DIAGNOSIS — M5416 Radiculopathy, lumbar region: Secondary | ICD-10-CM | POA: Diagnosis not present

## 2018-09-17 DIAGNOSIS — F411 Generalized anxiety disorder: Secondary | ICD-10-CM | POA: Diagnosis not present

## 2018-09-17 DIAGNOSIS — I10 Essential (primary) hypertension: Secondary | ICD-10-CM | POA: Diagnosis not present

## 2018-09-19 ENCOUNTER — Encounter: Payer: Self-pay | Admitting: Internal Medicine

## 2018-09-19 DIAGNOSIS — G20 Parkinson's disease: Secondary | ICD-10-CM | POA: Diagnosis not present

## 2018-09-19 DIAGNOSIS — I272 Pulmonary hypertension, unspecified: Secondary | ICD-10-CM | POA: Diagnosis not present

## 2018-09-19 DIAGNOSIS — F329 Major depressive disorder, single episode, unspecified: Secondary | ICD-10-CM | POA: Diagnosis not present

## 2018-09-19 DIAGNOSIS — I10 Essential (primary) hypertension: Secondary | ICD-10-CM | POA: Diagnosis not present

## 2018-09-19 DIAGNOSIS — M1991 Primary osteoarthritis, unspecified site: Secondary | ICD-10-CM | POA: Diagnosis not present

## 2018-09-19 DIAGNOSIS — I25810 Atherosclerosis of coronary artery bypass graft(s) without angina pectoris: Secondary | ICD-10-CM | POA: Diagnosis not present

## 2018-09-19 DIAGNOSIS — F411 Generalized anxiety disorder: Secondary | ICD-10-CM | POA: Diagnosis not present

## 2018-09-19 DIAGNOSIS — L409 Psoriasis, unspecified: Secondary | ICD-10-CM | POA: Diagnosis not present

## 2018-09-19 DIAGNOSIS — K811 Chronic cholecystitis: Secondary | ICD-10-CM | POA: Diagnosis not present

## 2018-09-19 DIAGNOSIS — M5416 Radiculopathy, lumbar region: Secondary | ICD-10-CM | POA: Diagnosis not present

## 2018-09-22 DIAGNOSIS — M1991 Primary osteoarthritis, unspecified site: Secondary | ICD-10-CM | POA: Diagnosis not present

## 2018-09-22 DIAGNOSIS — M5416 Radiculopathy, lumbar region: Secondary | ICD-10-CM | POA: Diagnosis not present

## 2018-09-22 DIAGNOSIS — F329 Major depressive disorder, single episode, unspecified: Secondary | ICD-10-CM | POA: Diagnosis not present

## 2018-09-22 DIAGNOSIS — F411 Generalized anxiety disorder: Secondary | ICD-10-CM | POA: Diagnosis not present

## 2018-09-22 DIAGNOSIS — I25810 Atherosclerosis of coronary artery bypass graft(s) without angina pectoris: Secondary | ICD-10-CM | POA: Diagnosis not present

## 2018-09-22 DIAGNOSIS — I10 Essential (primary) hypertension: Secondary | ICD-10-CM | POA: Diagnosis not present

## 2018-09-22 DIAGNOSIS — I272 Pulmonary hypertension, unspecified: Secondary | ICD-10-CM | POA: Diagnosis not present

## 2018-09-22 DIAGNOSIS — G20 Parkinson's disease: Secondary | ICD-10-CM | POA: Diagnosis not present

## 2018-09-22 DIAGNOSIS — L409 Psoriasis, unspecified: Secondary | ICD-10-CM | POA: Diagnosis not present

## 2018-09-23 ENCOUNTER — Telehealth: Payer: Self-pay | Admitting: Internal Medicine

## 2018-09-23 NOTE — Telephone Encounter (Signed)
Home Health Verbal Orders - Caller/Agency: Anna Genre Mitchell/[facility] Number: [phone] Requesting OT/PT/Skilled Nursing/Social Work/Speech Therapy.Marland KitchenSpeech Therapy Frequency: 1w 3

## 2018-09-23 NOTE — Telephone Encounter (Signed)
Verbal orders given via VM 

## 2018-09-24 DIAGNOSIS — M1991 Primary osteoarthritis, unspecified site: Secondary | ICD-10-CM | POA: Diagnosis not present

## 2018-09-24 DIAGNOSIS — I10 Essential (primary) hypertension: Secondary | ICD-10-CM | POA: Diagnosis not present

## 2018-09-24 DIAGNOSIS — M5416 Radiculopathy, lumbar region: Secondary | ICD-10-CM | POA: Diagnosis not present

## 2018-09-24 DIAGNOSIS — F329 Major depressive disorder, single episode, unspecified: Secondary | ICD-10-CM | POA: Diagnosis not present

## 2018-09-24 DIAGNOSIS — I272 Pulmonary hypertension, unspecified: Secondary | ICD-10-CM | POA: Diagnosis not present

## 2018-09-24 DIAGNOSIS — L409 Psoriasis, unspecified: Secondary | ICD-10-CM | POA: Diagnosis not present

## 2018-09-24 DIAGNOSIS — G20 Parkinson's disease: Secondary | ICD-10-CM | POA: Diagnosis not present

## 2018-09-24 DIAGNOSIS — F411 Generalized anxiety disorder: Secondary | ICD-10-CM | POA: Diagnosis not present

## 2018-09-24 DIAGNOSIS — I25810 Atherosclerosis of coronary artery bypass graft(s) without angina pectoris: Secondary | ICD-10-CM | POA: Diagnosis not present

## 2018-09-25 DIAGNOSIS — G20 Parkinson's disease: Secondary | ICD-10-CM | POA: Diagnosis not present

## 2018-09-25 DIAGNOSIS — I272 Pulmonary hypertension, unspecified: Secondary | ICD-10-CM | POA: Diagnosis not present

## 2018-09-25 DIAGNOSIS — F411 Generalized anxiety disorder: Secondary | ICD-10-CM | POA: Diagnosis not present

## 2018-09-25 DIAGNOSIS — I10 Essential (primary) hypertension: Secondary | ICD-10-CM | POA: Diagnosis not present

## 2018-09-25 DIAGNOSIS — F329 Major depressive disorder, single episode, unspecified: Secondary | ICD-10-CM | POA: Diagnosis not present

## 2018-09-25 DIAGNOSIS — L409 Psoriasis, unspecified: Secondary | ICD-10-CM | POA: Diagnosis not present

## 2018-09-25 DIAGNOSIS — M5416 Radiculopathy, lumbar region: Secondary | ICD-10-CM | POA: Diagnosis not present

## 2018-09-25 DIAGNOSIS — M1991 Primary osteoarthritis, unspecified site: Secondary | ICD-10-CM | POA: Diagnosis not present

## 2018-09-25 DIAGNOSIS — I25810 Atherosclerosis of coronary artery bypass graft(s) without angina pectoris: Secondary | ICD-10-CM | POA: Diagnosis not present

## 2018-09-29 DIAGNOSIS — L409 Psoriasis, unspecified: Secondary | ICD-10-CM | POA: Diagnosis not present

## 2018-09-29 DIAGNOSIS — G20 Parkinson's disease: Secondary | ICD-10-CM | POA: Diagnosis not present

## 2018-09-29 DIAGNOSIS — I10 Essential (primary) hypertension: Secondary | ICD-10-CM | POA: Diagnosis not present

## 2018-09-29 DIAGNOSIS — M5416 Radiculopathy, lumbar region: Secondary | ICD-10-CM | POA: Diagnosis not present

## 2018-09-29 DIAGNOSIS — I272 Pulmonary hypertension, unspecified: Secondary | ICD-10-CM | POA: Diagnosis not present

## 2018-09-29 DIAGNOSIS — I25810 Atherosclerosis of coronary artery bypass graft(s) without angina pectoris: Secondary | ICD-10-CM | POA: Diagnosis not present

## 2018-09-29 DIAGNOSIS — F329 Major depressive disorder, single episode, unspecified: Secondary | ICD-10-CM | POA: Diagnosis not present

## 2018-09-29 DIAGNOSIS — F411 Generalized anxiety disorder: Secondary | ICD-10-CM | POA: Diagnosis not present

## 2018-09-29 DIAGNOSIS — M1991 Primary osteoarthritis, unspecified site: Secondary | ICD-10-CM | POA: Diagnosis not present

## 2018-10-01 DIAGNOSIS — I272 Pulmonary hypertension, unspecified: Secondary | ICD-10-CM | POA: Diagnosis not present

## 2018-10-01 DIAGNOSIS — M5416 Radiculopathy, lumbar region: Secondary | ICD-10-CM | POA: Diagnosis not present

## 2018-10-01 DIAGNOSIS — I25810 Atherosclerosis of coronary artery bypass graft(s) without angina pectoris: Secondary | ICD-10-CM | POA: Diagnosis not present

## 2018-10-01 DIAGNOSIS — F411 Generalized anxiety disorder: Secondary | ICD-10-CM | POA: Diagnosis not present

## 2018-10-01 DIAGNOSIS — F329 Major depressive disorder, single episode, unspecified: Secondary | ICD-10-CM | POA: Diagnosis not present

## 2018-10-01 DIAGNOSIS — M1991 Primary osteoarthritis, unspecified site: Secondary | ICD-10-CM | POA: Diagnosis not present

## 2018-10-01 DIAGNOSIS — I10 Essential (primary) hypertension: Secondary | ICD-10-CM | POA: Diagnosis not present

## 2018-10-01 DIAGNOSIS — L409 Psoriasis, unspecified: Secondary | ICD-10-CM | POA: Diagnosis not present

## 2018-10-01 DIAGNOSIS — G20 Parkinson's disease: Secondary | ICD-10-CM | POA: Diagnosis not present

## 2018-10-03 DIAGNOSIS — I10 Essential (primary) hypertension: Secondary | ICD-10-CM | POA: Diagnosis not present

## 2018-10-03 DIAGNOSIS — G20 Parkinson's disease: Secondary | ICD-10-CM | POA: Diagnosis not present

## 2018-10-03 DIAGNOSIS — L409 Psoriasis, unspecified: Secondary | ICD-10-CM | POA: Diagnosis not present

## 2018-10-03 DIAGNOSIS — M5416 Radiculopathy, lumbar region: Secondary | ICD-10-CM | POA: Diagnosis not present

## 2018-10-03 DIAGNOSIS — I25810 Atherosclerosis of coronary artery bypass graft(s) without angina pectoris: Secondary | ICD-10-CM | POA: Diagnosis not present

## 2018-10-03 DIAGNOSIS — M1991 Primary osteoarthritis, unspecified site: Secondary | ICD-10-CM | POA: Diagnosis not present

## 2018-10-03 DIAGNOSIS — F329 Major depressive disorder, single episode, unspecified: Secondary | ICD-10-CM | POA: Diagnosis not present

## 2018-10-03 DIAGNOSIS — F411 Generalized anxiety disorder: Secondary | ICD-10-CM | POA: Diagnosis not present

## 2018-10-03 DIAGNOSIS — I272 Pulmonary hypertension, unspecified: Secondary | ICD-10-CM | POA: Diagnosis not present

## 2018-10-07 DIAGNOSIS — I25810 Atherosclerosis of coronary artery bypass graft(s) without angina pectoris: Secondary | ICD-10-CM | POA: Diagnosis not present

## 2018-10-07 DIAGNOSIS — I10 Essential (primary) hypertension: Secondary | ICD-10-CM | POA: Diagnosis not present

## 2018-10-07 DIAGNOSIS — F329 Major depressive disorder, single episode, unspecified: Secondary | ICD-10-CM | POA: Diagnosis not present

## 2018-10-07 DIAGNOSIS — M5416 Radiculopathy, lumbar region: Secondary | ICD-10-CM | POA: Diagnosis not present

## 2018-10-07 DIAGNOSIS — I272 Pulmonary hypertension, unspecified: Secondary | ICD-10-CM | POA: Diagnosis not present

## 2018-10-07 DIAGNOSIS — M1991 Primary osteoarthritis, unspecified site: Secondary | ICD-10-CM | POA: Diagnosis not present

## 2018-10-07 DIAGNOSIS — L409 Psoriasis, unspecified: Secondary | ICD-10-CM | POA: Diagnosis not present

## 2018-10-07 DIAGNOSIS — F411 Generalized anxiety disorder: Secondary | ICD-10-CM | POA: Diagnosis not present

## 2018-10-07 DIAGNOSIS — G20 Parkinson's disease: Secondary | ICD-10-CM | POA: Diagnosis not present

## 2018-10-08 DIAGNOSIS — M5416 Radiculopathy, lumbar region: Secondary | ICD-10-CM | POA: Diagnosis not present

## 2018-10-08 DIAGNOSIS — G20 Parkinson's disease: Secondary | ICD-10-CM | POA: Diagnosis not present

## 2018-10-08 DIAGNOSIS — I25810 Atherosclerosis of coronary artery bypass graft(s) without angina pectoris: Secondary | ICD-10-CM | POA: Diagnosis not present

## 2018-10-08 DIAGNOSIS — F411 Generalized anxiety disorder: Secondary | ICD-10-CM | POA: Diagnosis not present

## 2018-10-08 DIAGNOSIS — M1991 Primary osteoarthritis, unspecified site: Secondary | ICD-10-CM | POA: Diagnosis not present

## 2018-10-08 DIAGNOSIS — I272 Pulmonary hypertension, unspecified: Secondary | ICD-10-CM | POA: Diagnosis not present

## 2018-10-08 DIAGNOSIS — I10 Essential (primary) hypertension: Secondary | ICD-10-CM | POA: Diagnosis not present

## 2018-10-08 DIAGNOSIS — F329 Major depressive disorder, single episode, unspecified: Secondary | ICD-10-CM | POA: Diagnosis not present

## 2018-10-08 DIAGNOSIS — L409 Psoriasis, unspecified: Secondary | ICD-10-CM | POA: Diagnosis not present

## 2018-10-13 DIAGNOSIS — F329 Major depressive disorder, single episode, unspecified: Secondary | ICD-10-CM | POA: Diagnosis not present

## 2018-10-13 DIAGNOSIS — I25810 Atherosclerosis of coronary artery bypass graft(s) without angina pectoris: Secondary | ICD-10-CM | POA: Diagnosis not present

## 2018-10-13 DIAGNOSIS — I10 Essential (primary) hypertension: Secondary | ICD-10-CM | POA: Diagnosis not present

## 2018-10-13 DIAGNOSIS — G20 Parkinson's disease: Secondary | ICD-10-CM | POA: Diagnosis not present

## 2018-10-13 DIAGNOSIS — M5416 Radiculopathy, lumbar region: Secondary | ICD-10-CM | POA: Diagnosis not present

## 2018-10-13 DIAGNOSIS — L409 Psoriasis, unspecified: Secondary | ICD-10-CM | POA: Diagnosis not present

## 2018-10-13 DIAGNOSIS — M1991 Primary osteoarthritis, unspecified site: Secondary | ICD-10-CM | POA: Diagnosis not present

## 2018-10-13 DIAGNOSIS — I272 Pulmonary hypertension, unspecified: Secondary | ICD-10-CM | POA: Diagnosis not present

## 2018-10-13 DIAGNOSIS — F411 Generalized anxiety disorder: Secondary | ICD-10-CM | POA: Diagnosis not present

## 2018-10-13 NOTE — Progress Notes (Signed)
Virtual Visit via Video Note The purpose of this virtual visit is to provide medical care while limiting exposure to the novel coronavirus.    Consent was obtained for video visit:  Yes.   Answered questions that patient had about telehealth interaction:  Yes.   I discussed the limitations, risks, security and privacy concerns of performing an evaluation and management service by telemedicine. I also discussed with the patient that there may be a patient responsible charge related to this service. The patient expressed understanding and agreed to proceed.  Pt location: Home Physician Location: home Name of referring provider:  Biagio Borg, MD I connected with Brandon Rojas at patients initiation/request on 10/15/2018 at 11:15 AM EDT by video enabled telemedicine application and verified that I am speaking with the correct person using two identifiers. Pt MRN:  [id number] Pt DOB:  07/30/1945 Video Participants:  Brandon Rojas;  wife   History of Present Illness:  Patient seen today in follow-up for Parkinson's disease.  Patient is on carbidopa/levodopa 25/100, 1 tablet 3 times per day.  He has had no falls since last visit.  No lightheadedness or near syncope.  Much has happened since our last visit.  I have reviewed numerous records.  Patient was in the hospital in May with acute cholecystitis/acute gangrenous cholecystitis.  Wife was very frustrated during this hospital stay, as she felt his levodopa was not being given properly.  She was frustrated that she could not go to see him.  A percutaneous cholecystostomy tube was placed in May.  He underwent a drain study in June, which demonstrated a normal-appearing filling gallbladder as well as patent cystic and common bile duct.  He is following with Dr. Dema Severin for that.  He was back in the hospital in August after transient paresthesias of the right hand and face, and slurred speech that lasted about 10-15 minutes (pt states today that it was not  the right but the left side - all records indicate it was the right side - ER, neurohospitalist and stroke hospitalist).  Pt states that he had weakness of the L leg but states that is still weak and he is in PT for that (not noted in his records).  His Plavix was on hold at the time, getting ready for the cholecystectomy.  Patient had a CT of the brain that demonstrated no acute abnormality.  He had an MRI of the brain, that was also nonacute.  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  Carotid ultrasound was done and demonstrated 1 to 39% stenosis.  It was recommended that his surgery be postponed for 3 months.  He had an echocardiogram demonstrating left ventricular ejection fraction of 60 to 65%.  Patient was already on Mevacor 20 mg.  His LDL was at goal.   Observations/Objective:   Vitals:   10/15/18 0836  Weight: 143 lb (64.9 kg)  Height: 5\' 4"  (1.626 m)   GEN:  The patient appears stated age and is in NAD.  Neurological examination:  Orientation: The patient is alert and oriented x3. Cranial nerves: There is good facial symmetry. There is mild facial hypomimia.  The speech is fluent and clear. Soft palate rises symmetrically and there is no tongue deviation. Hearing is intact to conversational tone. Motor: Strength is at least antigravity x 4.   Shoulder shrug is equal and symmetric.  There is no pronator drift.  Movement examination: Tone: unable Abnormal movements: none Coordination:  There is decremation with RAM's,  with all forms of rapid alternating movements in the upper extremities bilaterally.  Was unable to see the lower extremities adequately to assess this. Gait and Station: The patient pushes off of his kitchen table to arise.  He walks well in his kitchen.  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  He has decreased arm swing on the left.  Lab Results  Component Value Date   CHOL 136 09/04/2018   HDL 55.40 09/04/2018   LDLCALC 56 09/04/2018   TRIG 124.0 09/04/2018   CHOLHDL 2  09/04/2018     Assessment and Plan:   1.  Idiopathic Parkinson's disease, newly diagnosed August 19, 2014             -continues to do well on carbidopa/levodopa 25/100 tid.    He does have decremation today with rapid alternating movements, but is able to move very well in his home and we decided not to change the dosing of his medication.  I did refill his medicine today.             -Encouraged him to do exercises safely while at home during the pandemic. 2.  History of hypertension             -off lisinopril and doing well 3.  Low back pain with lumbar radiculopathy             -was following with orthopedics but told no surgery indicated.               -using ultram prn.  No objection at this point in him, given that he is only using it a few days per week and it is not causing any mental status change.  Wife indicates that blood pressure up a little bit today because of the back pain. 4.  TIA, in August, 2020  -Patient had been off of his aspirin and Plavix as he was getting ready to undergo cholecystectomy.  He will stay on those until surgery, and then can be resumed postop.  Records indicate that the patient had reported right-sided paresthesias that lasted 10 minutes, but today he states it was left-sided paresthesias and still has left leg weakness.  This was in contradiction to his records.  He is in physical therapy.  I wonder if he did not have right-sided paresthesias and the left leg issues are from Parkinson's, but I am not sure.  -Carotid ultrasound was unremarkable.  -We discussed the diagnosis as well as pathophysiology of the disease.  We discussed signs and sx's of stroke and importance of calling 911 immediately should he have any of these.  Patient education was provided.    -Talked about stroke risk factors.  Talked about those risk factors which are modifiable.  -Talked about importance of blood pressure control with a goal <130/80 mm Hg.   -Talked about importance of  lipid control and proper diet.  Lipids should be managed intensively, with a goal LDL < 70 mg/dL.  His is only 52.  -I counseled the patient on measures to reduce stroke risk, including the importance of medication compliance, risk factor control, exercise, healthy diet, and avoidance of smoking.   5.  Acute gangrenous cholecystitis  -Is waiting to have surgery.  Was scheduled for it, and then was postponed because of a TIA.  Was requested that this be postponed for 3 months from the TIA, as stroke risk is highest in the first 3 months after an event.  Pt states that he  will have this at end of November.  Discussed with him that he is certainly at higher stroke risk, but surgery at 3 months is likely reasonable.  If he has any focal/lateralizing symptoms at all, he was told to go to the emergency room immediately. 6.  Follow up is anticipated in the next 4-6 months, sooner should new neurologic issues arise.   Follow Up Instructions:   -I discussed the assessment and treatment plan with the patient. The patient was provided an opportunity to ask questions and all were answered. The patient agreed with the plan and demonstrated an understanding of the instructions.   The patient was advised to call back or seek an in-person evaluation if the symptoms worsen or if the condition fails to improve as anticipated.    Total Time spent in visit with the patient was: 25 minutes, of which more than 50% of the time was spent in counseling.   Pt understands and agrees with the plan of care outlined.     Alonza Bogus, DO

## 2018-10-15 ENCOUNTER — Telehealth (INDEPENDENT_AMBULATORY_CARE_PROVIDER_SITE_OTHER): Payer: Medicare PPO | Admitting: Neurology

## 2018-10-15 ENCOUNTER — Encounter: Payer: Self-pay | Admitting: Neurology

## 2018-10-15 ENCOUNTER — Other Ambulatory Visit: Payer: Self-pay

## 2018-10-15 VITALS — Ht 64.0 in | Wt 143.0 lb

## 2018-10-15 DIAGNOSIS — K811 Chronic cholecystitis: Secondary | ICD-10-CM | POA: Diagnosis not present

## 2018-10-15 DIAGNOSIS — G459 Transient cerebral ischemic attack, unspecified: Secondary | ICD-10-CM | POA: Diagnosis not present

## 2018-10-15 DIAGNOSIS — G20 Parkinson's disease: Secondary | ICD-10-CM | POA: Diagnosis not present

## 2018-10-16 DIAGNOSIS — H2513 Age-related nuclear cataract, bilateral: Secondary | ICD-10-CM | POA: Diagnosis not present

## 2018-10-16 DIAGNOSIS — H5203 Hypermetropia, bilateral: Secondary | ICD-10-CM | POA: Diagnosis not present

## 2018-10-28 DIAGNOSIS — K811 Chronic cholecystitis: Secondary | ICD-10-CM | POA: Diagnosis not present

## 2018-10-30 ENCOUNTER — Other Ambulatory Visit (HOSPITAL_COMMUNITY): Payer: Self-pay | Admitting: Surgery

## 2018-10-30 DIAGNOSIS — K811 Chronic cholecystitis: Secondary | ICD-10-CM

## 2018-11-06 ENCOUNTER — Ambulatory Visit (HOSPITAL_COMMUNITY)
Admission: RE | Admit: 2018-11-06 | Discharge: 2018-11-06 | Disposition: A | Discharge status: Home / Self Care | Payer: Medicare PPO | Source: Ambulatory Visit | Attending: Surgery | Admitting: Surgery

## 2018-11-06 ENCOUNTER — Encounter (HOSPITAL_COMMUNITY): Payer: Self-pay | Admitting: Interventional Radiology

## 2018-11-06 ENCOUNTER — Other Ambulatory Visit (HOSPITAL_COMMUNITY): Payer: Self-pay | Admitting: Surgery

## 2018-11-06 ENCOUNTER — Other Ambulatory Visit: Payer: Self-pay

## 2018-11-06 DIAGNOSIS — K811 Chronic cholecystitis: Secondary | ICD-10-CM | POA: Insufficient documentation

## 2018-11-06 DIAGNOSIS — Z4803 Encounter for change or removal of drains: Secondary | ICD-10-CM | POA: Insufficient documentation

## 2018-11-06 DIAGNOSIS — K819 Cholecystitis, unspecified: Secondary | ICD-10-CM | POA: Diagnosis not present

## 2018-11-06 HISTORY — PX: IR EXCHANGE BILIARY DRAIN: IMG6046

## 2018-11-06 MED ORDER — LIDOCAINE HCL 1 % IJ SOLN
INTRAMUSCULAR | Status: DC | PRN
  Administered 2018-11-06: 10 mL

## 2018-11-06 MED ORDER — LIDOCAINE HCL 1 % IJ SOLN
INTRAMUSCULAR | Status: AC
  Filled 2018-11-06: qty 20

## 2018-11-06 MED ORDER — IOHEXOL 300 MG/ML  SOLN
50.0000 mL | Freq: Once | INTRAMUSCULAR | Status: AC | PRN
  Administered 2018-11-06: 10 mL

## 2018-11-06 NOTE — Procedures (Signed)
  Procedure: Cholecystostomy catheter exchange with capping trial EBL:   minimal Complications:  none immediate  See full dictation in [facility].  Dillard Cannon MD Main # [phone] Pager  [phone]

## 2018-11-08 ENCOUNTER — Other Ambulatory Visit: Payer: Self-pay | Admitting: Internal Medicine

## 2018-11-14 ENCOUNTER — Telehealth: Payer: Self-pay | Admitting: Neurology

## 2018-11-14 ENCOUNTER — Other Ambulatory Visit: Payer: Self-pay

## 2018-11-14 MED ORDER — CARBIDOPA-LEVODOPA 25-100 MG PO TABS: 1.0000 | ORAL_TABLET | Freq: Three times a day (TID) | ORAL | 0 refills | Status: DC

## 2018-11-14 NOTE — Telephone Encounter (Addendum)
Patient's wife called and said her husbands carbidopa-levodopa 25-100 MG prescription is delayed in the mail.  She is requesting a prescription to be sent to the local pharmacy below for about two weeks worth of the medicine to last until it arrives in the mail. He only has enough to last until Monday, 11/14/18.  [facility]

## 2018-11-14 NOTE — Telephone Encounter (Signed)
Sent a 2 week supply and a mychart message to notify patient

## 2018-11-24 NOTE — Telephone Encounter (Signed)
Sending to clinical staff for review: Okay to sign/close encounter or is further follow up needed? ° °

## 2018-11-25 DIAGNOSIS — K811 Chronic cholecystitis: Secondary | ICD-10-CM | POA: Diagnosis not present

## 2018-12-02 ENCOUNTER — Other Ambulatory Visit: Payer: Self-pay | Admitting: Internal Medicine

## 2018-12-02 NOTE — Telephone Encounter (Signed)
Done erx 

## 2018-12-23 ENCOUNTER — Encounter: Payer: Self-pay | Admitting: Internal Medicine

## 2018-12-29 NOTE — Progress Notes (Signed)
Cardiology Office Note   Date:  12/30/2018   ID:  Brandon, Rojas 01-06-1946, MRN [id number]  PCP:  Biagio Borg, MD  Cardiologist: Dr. Percival Spanish CC: Follow Up    History of Present Illness: Brandon Rojas is a 73 y.o. male wMr. Mutti is being follow for ongoing assessment and management of CAD, s/p CABG in 2006 (LIMA to LAD, SVG to PLA, MR, HTN, HL, with hx of OA, GERD, and colon polyps. Was last seen on 05/23/2018 for pre-operative evaluation while hospitalized. Brandon Rojas  He was cleared at that time for cholecystectomy and was okay to hold clopidogrel for 5 days prior to surgery.   He comes today having had gastrostomy tube placed to his gallbladder for 3 months and has had it recently removed in October 2020.  He did not undergo cholecystectomy.  He has also had a CVA, diagnosed as a chronic right thalamus lacunar infarct noted for MRI on 08/27/2018), with left-sided weakness which is improving over time, he continues to be treated by neurology in the setting of Parkinson's disease.  He denies any chest discomfort.  His main complaint is dependent edema.  Past Medical History:  Diagnosis Date  . Anxiety state, unspecified   . Arthritis   . Benign neoplasm of colon   . CAD (coronary artery disease) of artery bypass graft 05/20/2018  . Coronary atherosclerosis of unspecified type of vessel, native or graft   . Depressive disorder, not elsewhere classified   . Diaphragmatic hernia without mention of obstruction or gangrene   . Diverticulosis of colon (without mention of hemorrhage)   . Elevated troponin 05/20/2018  . Epigastric pain 05/20/2018  . Esophageal reflux   . Esophageal stricture   . Hiatal hernia   . Mitral regurgitation   . Osteoarthrosis, unspecified whether generalized or localized, unspecified site   . Other and unspecified hyperlipidemia   . Parkinson's disease ([place])   . Personal history of colonic polyps   . Personal history of unspecified circulatory disease   . Unspecified  essential hypertension     Past Surgical History:  Procedure Laterality Date  . COLONOSCOPY    . CORONARY ARTERY BYPASS GRAFT     x 2  . INGUINAL HERNIA REPAIR     bilateral  . IR EXCHANGE BILIARY DRAIN  11/06/2018  . IR PERC CHOLECYSTOSTOMY  05/22/2018  . IR RADIOLOGIST EVAL & MGMT  06/24/2018  . IR RADIOLOGIST EVAL & MGMT  07/01/2018  . LUNG BIOPSY    . POLYPECTOMY    . PTCA       Current Outpatient Medications  Medication Sig Dispense Refill  . aspirin 81 MG tablet Take 81 mg by mouth daily.      . B Complex Vitamins (VITAMIN B COMPLEX PO) Take 1 tablet by mouth every other day.     . carbidopa-levodopa (SINEMET IR) 25-100 MG tablet Take 1 tablet by mouth 3 (three) times daily. 42 tablet 0  . clopidogrel (PLAVIX) 75 MG tablet TAKE 1 TABLET EVERY DAY WITH BREAKFAST 90 tablet 1  . esomeprazole (NEXIUM) 40 MG capsule TAKE 1 CAPSULE BY MOUTH ONCE DAILY 90 capsule 0  . lovastatin (MEVACOR) 20 MG tablet TAKE 1 TABLET AT BEDTIME 90 tablet 1  . Multiple Vitamin (MULTIVITAMIN WITH MINERALS) TABS tablet Take 1 tablet by mouth daily.    Brandon Rojas NITROSTAT 0.4 MG SL tablet PLACE 1 TABLET (0.4 MG TOTAL) UNDER THE TONGUE EVERY 5 (FIVE) MINUTES AS NEEDED. 25 tablet 5  .  tetrahydrozoline-zinc (VISINE-AC) 0.05-0.25 % ophthalmic solution Place 2 drops into both eyes 3 (three) times daily as needed (for burning or eye redness).    Brandon Rojas tiZANidine (ZANAFLEX) 2 MG tablet Take 1 tablet (2 mg total) by mouth every 6 (six) hours as needed for muscle spasms. 40 tablet 1  . traMADol (ULTRAM) 50 MG tablet TAKE 1 TABLET BY MOUTH EVERY 6 HOURS AS NEEDED FOR PAIN 120 tablet 5   No current facility-administered medications for this visit.    Allergies:   Atorvastatin and Simvastatin    Social History:  The patient  reports that he quit smoking about 46 years ago. He has never used smokeless tobacco. He reports that he does not drink alcohol or use drugs.   Family History:  The patient's family history includes  Colon cancer in his sister; Fibromyalgia in his daughter; Healthy in his brother, brother, brother, daughter, and sister; Heart attack in his mother; Heart disease in his brother; Multiple sclerosis in his son; Stroke in his father.    ROS: All other systems are reviewed and negative. Unless otherwise mentioned in H&P    PHYSICAL EXAM: VS:  Pulse (!) 56   Ht 5\' 4"  (1.626 m)   Wt 148 lb (67.1 kg)   SpO2 100%   BMI 25.40 kg/m  , BMI Body mass index is 25.4 kg/m. GEN: Well nourished, well developed, in no acute distress HEENT: normal Neck: no JVD, carotid bruits, or masses Cardiac: RRR; no murmurs, rubs, or gallops, 2+ dependent edema  Respiratory:  Clear to auscultation bilaterally, normal work of breathing GI: soft, nontender, nondistended, + BS MS: no deformity or atrophy Skin: warm and dry, no rash, pale Neuro:  Strength and sensation are intact, no tremors are noted, he does have some diminished strength on the left but has sufficient handgrips. Psych: euthymic mood, full affect, speaks very slowly, but has a good sense of humor   EKG: Not completed this office visit I did I does notice that your  Recent Labs: 09/04/2018: ALT 6; BUN 12; Creatinine, Ser 1.00; Hemoglobin 14.9; Platelets 253.0; Potassium 4.2; Sodium 139; TSH 3.02    Lipid Panel    Component Value Date/Time   CHOL 136 09/04/2018 1430   TRIG 124.0 09/04/2018 1430   HDL 55.40 09/04/2018 1430   CHOLHDL 2 09/04/2018 1430   VLDL 24.8 09/04/2018 1430   LDLCALC 56 09/04/2018 1430      Wt Readings from Last 3 Encounters:  12/30/18 148 lb (67.1 kg)  10/15/18 143 lb (64.9 kg)  09/04/18 144 lb (65.3 kg)      Other studies Reviewed: ECHO:07/09/2017 - Left ventricle: The cavity size was normal. Wall thickness was increased in a pattern of mild LVH. Systolic function was normal. The estimated ejection fraction was in the range of 55% to 60%. Wall motion was normal; there were no regional wall  motion abnormalities. Features are consistent with a pseudonormal left ventricular filling pattern, with concomitant abnormal relaxation and increased filling pressure (grade 2 diastolic dysfunction). - Mitral valve: Mildly calcified annulus. There was moderate regurgitation. - Left atrium: The atrium was severely dilated. - Right ventricle: The cavity size was mildly dilated. - Right atrium: The atrium was severely dilated. - Tricuspid valve: There was moderate regurgitation. - Pulmonary arteries: Systolic pressure was moderately increased. PA peak pressure: 44 mm Hg (S).  CATH:03/08/2010 ANGIOGRAPHIC DATA: 1. The left main is free of critical disease. 2. The LAD is diffusely irregular proximally and diffusely plaqued. Beyond this,  there is a stent previously placed leading into a smaller caliber diagonal with about 60% proximal narrowing. The stent has about 40% in-stent re-narrowing. After the takeoff of the small diagonal, there is an 80% stenosis and had evidence of competitive filling distally. 3. The internal mammary to the distal LAD is widely patent, fills the LAD retrograde. 4. The circumflex is a dominant vessel. There is 80% narrowing prior to the first tiny marginal branch. The first marginal branch has 70% narrowing proximally and is only about 1.4-mm caliber vessel. The circumflex in this territory is somewhat diffusely diseased and then opens back up and has about 70% narrowing distally. 5. The saphenous vein graft to the distal posterolateral branch is widely patent without significant narrowing and retrograde fills the posterolateral branch into the PDA. 6. The right coronary artery is small dominant vessel without critical disease. 7. Ventriculography in the RAO projection reveals well-preserved global systolic function. CONCLUSION: 1. Well-preserved left ventricular function. 2.  Continued patency of internal mammary to the LAD. 3. Continued patency of the saphenous vein graft to the posterolateral branch. 4. Small-caliber disease involving the first diagonal and first marginal that do not appear to be optimal for percutaneous intervention.  ASSESSMENT AND PLAN:  1.  CAD: History of CABG in 2006, LIMA to LAD, SVG to PLA,): He is without any cardiac complaints today.  He is not extremely active due to his Parkinson's.  He denies any worsening shortness of breath, pressure in his chest, or worsening fatigue other than usual with Parkinson's at times.  He will continue on carvedilol, lisinopril, labs are drawn by his primary care physician Dr. Jeneen Rinks who is due to see in March 2021.  2.  Chronic lower extremity edema: Likely from dependent edema due to inactivity.  I recommended that he wear support hose.  I do not want to add any further diuretics to his regimen at this time to avoid hypotension and potentiate falls especially in the setting of frailty from Parkinson's.  We have given him a brochure from "elastic therapy" so that they can have him measured to wear those during the day.  I have advised him to put them on in the morning before he gets out of bed so that he is at his lowest edema burden.  He can take them off at night for break.  They verbalized understanding.  3.  History of lacunar infarct: Noted per MRI, continues to have some left-sided weakness.  He is to follow with neurology as he is already established with them in the setting of Parkinson's disease.    4.  Parkinson's disease: As above recommendations per neurology.  5.  Diabetes: On Metformin followed by PCP  Current medicines are reviewed at length with the patient today.    Labs/ tests ordered today include:  Phill Myron. [place] Pugh, ANP, AACC   12/30/2018 12:29 PM    [address] Office [phone] Fax [phone]  Notice:  This dictation was prepared with Dragon dictation along with smaller phrase technology. Any transcriptional errors that result from this process are unintentional and may not be corrected upon review.

## 2018-12-30 ENCOUNTER — Other Ambulatory Visit: Payer: Self-pay

## 2018-12-30 ENCOUNTER — Encounter: Payer: Self-pay | Admitting: Adult Health

## 2018-12-30 ENCOUNTER — Ambulatory Visit: Payer: Medicare PPO | Admitting: Adult Health

## 2018-12-30 VITALS — BP 123/68 | HR 56 | Ht 64.0 in | Wt 148.0 lb

## 2018-12-30 DIAGNOSIS — I2510 Atherosclerotic heart disease of native coronary artery without angina pectoris: Secondary | ICD-10-CM | POA: Diagnosis not present

## 2018-12-30 DIAGNOSIS — I6381 Other cerebral infarction due to occlusion or stenosis of small artery: Secondary | ICD-10-CM | POA: Diagnosis not present

## 2018-12-30 NOTE — Patient Instructions (Signed)
Medication Instructions:  Continue current medications  If you need a refill on your cardiac medications before your next appointment, please call your pharmacy.  Labwork: None Ordered   Testing/Procedures: None Ordered  PLEASE READ AND FOLLOW SALTY 6 ATTACHED  Reduce your risk of getting COVID-19 With your heart disease it is especially important for people at increased risk of severe illness from COVID-19, and those who live with them, to protect themselves from getting COVID-19. The best way to protect yourself and to help reduce the spread of the virus that causes COVID-19 is to: [name] Limit your interactions with other people as much as possible. . Take precautions to prevent getting COVID-19 when you do interact with others. If you start feeling sick and think you may have COVID-19, get in touch with your healthcare provider within 24 hours.  Follow-Up: IN 6 months Please call our office 2 months in advance,  to schedule this  appointment. In Person Minus Breeding, MD.    At [facility], you and your health needs are our priority.  As part of our continuing mission to provide you with exceptional heart care, we have created designated Provider Care Teams.  These Care Teams include your primary Cardiologist (physician) and Advanced Practice Providers (APPs -  Physician Assistants and Nurse Practitioners) who all work together to provide you with the care you need, when you need it.  Thank you for choosing [facility]!!     Happy Holidays!!

## 2019-01-30 ENCOUNTER — Other Ambulatory Visit: Payer: Self-pay | Admitting: Internal Medicine

## 2019-01-31 ENCOUNTER — Other Ambulatory Visit: Payer: Self-pay | Admitting: Internal Medicine

## 2019-01-31 NOTE — Telephone Encounter (Signed)
Please refill as per office routine med refill policy (all routine meds refilled for 3 mo or monthly per pt preference up to one year from last visit, then month to month grace period for 3 mo, then further med refills will have to be denied)  

## 2019-02-05 ENCOUNTER — Other Ambulatory Visit: Payer: Self-pay | Admitting: Neurology

## 2019-03-09 ENCOUNTER — Other Ambulatory Visit: Payer: Self-pay

## 2019-03-09 ENCOUNTER — Encounter: Payer: Self-pay | Admitting: Internal Medicine

## 2019-03-09 ENCOUNTER — Ambulatory Visit (INDEPENDENT_AMBULATORY_CARE_PROVIDER_SITE_OTHER): Payer: Medicare HMO | Admitting: Internal Medicine

## 2019-03-09 VITALS — BP 124/72 | HR 65 | Temp 97.8°F | Ht 64.0 in | Wt 145.0 lb

## 2019-03-09 DIAGNOSIS — Z0000 Encounter for general adult medical examination without abnormal findings: Secondary | ICD-10-CM

## 2019-03-09 DIAGNOSIS — G20 Parkinson's disease: Secondary | ICD-10-CM | POA: Diagnosis not present

## 2019-03-09 DIAGNOSIS — R351 Nocturia: Secondary | ICD-10-CM

## 2019-03-09 DIAGNOSIS — I10 Essential (primary) hypertension: Secondary | ICD-10-CM

## 2019-03-09 LAB — BASIC METABOLIC PANEL
BUN: 15 mg/dL (ref 6–23)
CO2: 28 mEq/L (ref 19–32)
Calcium: 10 mg/dL (ref 8.4–10.5)
Chloride: 105 mEq/L (ref 96–112)
Creatinine, Ser: 0.99 mg/dL (ref 0.40–1.50)
GFR: 73.73 mL/min (ref >60.00)
Glucose, Bld: 102 mg/dL — ABNORMAL HIGH (ref 70–99)
Potassium: 4.2 mEq/L (ref 3.5–5.1)
Sodium: 140 mEq/L (ref 135–145)

## 2019-03-09 LAB — LIPID PANEL
Cholesterol: 128 mg/dL (ref 0–200)
HDL: 52.5 mg/dL (ref >39.00)
LDL Cholesterol: 56 mg/dL (ref 0–99)
NonHDL: 75.9
Total CHOL/HDL Ratio: 2
Triglycerides: 101 mg/dL (ref 0.0–149.0)
VLDL: 20.2 mg/dL (ref 0.0–40.0)

## 2019-03-09 LAB — URINALYSIS, ROUTINE W REFLEX MICROSCOPIC
Bilirubin Urine: NEGATIVE
Hgb urine dipstick: NEGATIVE
Ketones, ur: NEGATIVE
Leukocytes,Ua: NEGATIVE
Nitrite: NEGATIVE
RBC / HPF: NONE SEEN (ref 0–?)
Specific Gravity, Urine: 1.015 (ref 1.000–1.030)
Total Protein, Urine: NEGATIVE
Urine Glucose: NEGATIVE
Urobilinogen, UA: 1 (ref 0.0–1.0)
WBC, UA: NONE SEEN (ref 0–?)
pH: 6 (ref 5.0–8.0)

## 2019-03-09 LAB — HEPATIC FUNCTION PANEL
ALT: 4 U/L (ref 0–53)
AST: 16 U/L (ref 0–37)
Albumin: 3.9 g/dL (ref 3.5–5.2)
Alkaline Phosphatase: 70 U/L (ref 39–117)
Bilirubin, Direct: 0.1 mg/dL (ref 0.0–0.3)
Total Bilirubin: 0.5 mg/dL (ref 0.2–1.2)
Total Protein: 7 g/dL (ref 6.0–8.3)

## 2019-03-09 LAB — CBC WITH DIFFERENTIAL/PLATELET
Basophils Absolute: 0.1 10*3/uL (ref 0.0–0.1)
Basophils Relative: 0.9 % (ref 0.0–3.0)
Eosinophils Absolute: 0.4 10*3/uL (ref 0.0–0.7)
Eosinophils Relative: 4.9 % (ref 0.0–5.0)
HCT: 43.3 % (ref 39.0–52.0)
Hemoglobin: 14.6 g/dL (ref 13.0–17.0)
Lymphocytes Relative: 19.7 % (ref 12.0–46.0)
Lymphs Abs: 1.4 10*3/uL (ref 0.7–4.0)
MCHC: 33.8 g/dL (ref 30.0–36.0)
MCV: 90.4 fl (ref 78.0–100.0)
Monocytes Absolute: 0.6 10*3/uL (ref 0.1–1.0)
Monocytes Relative: 8.3 % (ref 3.0–12.0)
Neutro Abs: 4.7 10*3/uL (ref 1.4–7.7)
Neutrophils Relative %: 66.2 % (ref 43.0–77.0)
Platelets: 235 10*3/uL (ref 150.0–400.0)
RBC: 4.79 Mil/uL (ref 4.22–5.81)
RDW: 14.6 % (ref 11.5–15.5)
WBC: 7.1 10*3/uL (ref 4.0–10.5)

## 2019-03-09 LAB — TSH: TSH: 2.17 u[IU]/mL (ref 0.35–4.50)

## 2019-03-09 NOTE — Patient Instructions (Addendum)
Please consider Vesicare for the bladder  You are now on the [facility] Vaccine Wait List  Please continue all other medications as before, and refills have been done if requested.  Please have the pharmacy call with any other refills you may need.  Please continue your efforts at being more active, low cholesterol diet, and weight control.  You are otherwise up to date with prevention measures today.  Please keep your appointments with your specialists as you may have planned  Please go to the LAB at the blood drawing area for the tests to be done  You will be contacted by phone if any changes need to be made immediately.  Otherwise, you will receive a letter about your results with an explanation, but please check with MyChart first.  Please remember to sign up for MyChart if you have not done so, as this will be important to you in the future with finding out test results, communicating by private email, and scheduling acute appointments online when needed.  Please make an Appointment to return in 6 months, or sooner if needed

## 2019-03-09 NOTE — Assessment & Plan Note (Signed)

## 2019-03-09 NOTE — Assessment & Plan Note (Signed)
stable overall by history and exam, recent data reviewed with pt, and pt to continue medical treatment as before,  to f/u any worsening symptoms or concerns  

## 2019-03-09 NOTE — Assessment & Plan Note (Signed)
Due for ROV - encouraged to make f/u appt with Dr Tat

## 2019-03-09 NOTE — Assessment & Plan Note (Signed)
C/w likely OAB - declines vesicare for now

## 2019-03-09 NOTE — Progress Notes (Signed)
Subjective:    Patient ID: Brandon Rojas, male    DOB: [birth year], 74 y.o.   MRN: [id number]  HPI  Here for wellness and f/u;  Overall doing ok;  Pt denies Chest pain, worsening SOB, DOE, wheezing, orthopnea, PND, worsening LE edema, palpitations, dizziness or syncope. Pt denies polydipsia, polyuria, or low sugar symptoms. Pt states overall good compliance with treatment and medications, good tolerability, and has been trying to follow appropriate diet.  Pt denies worsening depressive symptoms, suicidal ideation or panic. No fever, night sweats, wt loss, loss of appetite, or other constitutional symptoms.  Pt states good ability with ADL's, has low fall risk, home safety reviewed and adequate, no other significant changes in hearing or vision, but has transient worsening ambulation over 1 wk ago, now improved.  No HA, and LLE weakness appears to be the same to him Past Medical History:  Diagnosis Date  . Anxiety state, unspecified   . Arthritis   . Benign neoplasm of colon   . CAD (coronary artery disease) of artery bypass graft 05/20/2018  . Coronary atherosclerosis of unspecified type of vessel, native or graft   . Depressive disorder, not elsewhere classified   . Diaphragmatic hernia without mention of obstruction or gangrene   . Diverticulosis of colon (without mention of hemorrhage)   . Elevated troponin 05/20/2018  . Epigastric pain 05/20/2018  . Esophageal reflux   . Esophageal stricture   . Hiatal hernia   . Mitral regurgitation   . Osteoarthrosis, unspecified whether generalized or localized, unspecified site   . Other and unspecified hyperlipidemia   . Parkinson's disease ([place])   . Personal history of colonic polyps   . Personal history of unspecified circulatory disease   . Unspecified essential hypertension    Past Surgical History:  Procedure Laterality Date  . COLONOSCOPY    . CORONARY ARTERY BYPASS GRAFT     x 2  . INGUINAL HERNIA REPAIR     bilateral  . IR EXCHANGE  BILIARY DRAIN  11/06/2018  . IR PERC CHOLECYSTOSTOMY  05/22/2018  . IR RADIOLOGIST EVAL & MGMT  06/24/2018  . IR RADIOLOGIST EVAL & MGMT  07/01/2018  . LUNG BIOPSY    . POLYPECTOMY    . PTCA      reports that he quit smoking about 47 years ago. He has never used smokeless tobacco. He reports that he does not drink alcohol or use drugs. family history includes Colon cancer in his sister; Fibromyalgia in his daughter; Healthy in his brother, brother, brother, daughter, and sister; Heart attack in his mother; Heart disease in his brother; Multiple sclerosis in his son; Stroke in his father. Allergies  Allergen Reactions  . Atorvastatin Other (See Comments)    Muscle cramps  . Simvastatin Other (See Comments)    Muscle cramps   Current Outpatient Medications on File Prior to Visit  Medication Sig Dispense Refill  . aspirin 81 MG tablet Take 81 mg by mouth daily.      . B Complex Vitamins (VITAMIN B COMPLEX PO) Take 1 tablet by mouth every other day.     . carbidopa-levodopa (SINEMET IR) 25-100 MG tablet TAKE 1 TABLET THREE TIMES DAILY 270 tablet 1  . clopidogrel (PLAVIX) 75 MG tablet TAKE 1 TABLET EVERY DAY WITH BREAKFAST 90 tablet 1  . esomeprazole (NEXIUM) 40 MG capsule TAKE 1 CAPSULE BY MOUTH ONCE DAILY 90 capsule 0  . lovastatin (MEVACOR) 20 MG tablet TAKE 1 TABLET AT BEDTIME 90 tablet  1  . Multiple Vitamin (MULTIVITAMIN WITH MINERALS) TABS tablet Take 1 tablet by mouth daily.    [name] NITROSTAT 0.4 MG SL tablet PLACE 1 TABLET (0.4 MG TOTAL) UNDER THE TONGUE EVERY 5 (FIVE) MINUTES AS NEEDED. 25 tablet 5  . tiZANidine (ZANAFLEX) 2 MG tablet TAKE 1 TABLET BY MOUTH EVERY 6 HOURS AS NEEDED FOR MUSCLE SPASMS 40 tablet 1  . traMADol (ULTRAM) 50 MG tablet TAKE 1 TABLET BY MOUTH EVERY 6 HOURS AS NEEDED FOR PAIN 120 tablet 5  . tetrahydrozoline-zinc (VISINE-AC) 0.05-0.25 % ophthalmic solution Place 2 drops into both eyes 3 (three) times daily as needed (for burning or eye redness).     No current  facility-administered medications on file prior to visit.   Review of Systems All otherwise neg per pt     Objective:   Physical Exam BP 124/72   Pulse 65   Temp 97.8 F (36.6 C)   Ht 5\' 4"  (1.626 m)   Wt 145 lb (65.8 kg)   SpO2 98%   BMI 24.89 kg/m  VS noted,  Constitutional: Pt appears in NAD HENT: Head: NCAT.  Right Ear: External ear normal.  Left Ear: External ear normal.  Eyes: . Pupils are equal, round, and reactive to light. Conjunctivae and EOM are normal Nose: without d/c or deformity Neck: Neck supple. Gross normal ROM Cardiovascular: Normal rate and regular rhythm.   Pulmonary/Chest: Effort normal and breath sounds without rales or wheezing.  Abd:  Soft, NT, ND, + BS, no organomegaly Neurological: Pt is alert. At baseline orientation, motor grossly intact Skin: Skin is warm. No rashes, other new lesions, no LE edema Psychiatric: Pt behavior is normal without agitation  All otherwise neg per pt  Lab Results  Component Value Date   WBC 7.1 03/09/2019   HGB 14.6 03/09/2019   HCT 43.3 03/09/2019   PLT 235.0 03/09/2019   GLUCOSE 102 (H) 03/09/2019   CHOL 128 03/09/2019   TRIG 101.0 03/09/2019   HDL 52.50 03/09/2019   LDLCALC 56 03/09/2019   ALT 4 03/09/2019   AST 16 03/09/2019   NA 140 03/09/2019   K 4.2 03/09/2019   CL 105 03/09/2019   CREATININE 0.99 03/09/2019   BUN 15 03/09/2019   CO2 28 03/09/2019   TSH 2.17 03/09/2019   PSA 1.23 09/04/2018   INR 0.9 08/26/2018      Assessment & Plan:

## 2019-03-11 ENCOUNTER — Telehealth: Payer: Self-pay | Admitting: Internal Medicine

## 2019-03-11 MED ORDER — SOLIFENACIN SUCCINATE 5 MG PO TABS: 5.0000 mg | ORAL_TABLET | Freq: Every day | ORAL | 3 refills | Status: DC

## 2019-03-11 NOTE — Telephone Encounter (Signed)
Ok this is done 

## 2019-03-11 NOTE — Telephone Encounter (Signed)
New message:   Pt is calling and state he would like to start the Vesicare that Dr. Jenny Reichmann recommended for him to start taken. He states it can be sent to the [facility]

## 2019-03-13 ENCOUNTER — Other Ambulatory Visit: Payer: Self-pay | Admitting: Internal Medicine

## 2019-03-17 DIAGNOSIS — H2513 Age-related nuclear cataract, bilateral: Secondary | ICD-10-CM | POA: Diagnosis not present

## 2019-03-17 DIAGNOSIS — H25013 Cortical age-related cataract, bilateral: Secondary | ICD-10-CM | POA: Diagnosis not present

## 2019-03-17 DIAGNOSIS — H40033 Anatomical narrow angle, bilateral: Secondary | ICD-10-CM | POA: Diagnosis not present

## 2019-03-17 DIAGNOSIS — H2511 Age-related nuclear cataract, right eye: Secondary | ICD-10-CM | POA: Diagnosis not present

## 2019-03-17 DIAGNOSIS — H25043 Posterior subcapsular polar age-related cataract, bilateral: Secondary | ICD-10-CM | POA: Diagnosis not present

## 2019-03-17 DIAGNOSIS — H40031 Anatomical narrow angle, right eye: Secondary | ICD-10-CM | POA: Diagnosis not present

## 2019-03-17 DIAGNOSIS — H18413 Arcus senilis, bilateral: Secondary | ICD-10-CM | POA: Diagnosis not present

## 2019-03-23 DIAGNOSIS — H2513 Age-related nuclear cataract, bilateral: Secondary | ICD-10-CM | POA: Diagnosis not present

## 2019-03-23 DIAGNOSIS — H40033 Anatomical narrow angle, bilateral: Secondary | ICD-10-CM | POA: Diagnosis not present

## 2019-04-07 DIAGNOSIS — H40032 Anatomical narrow angle, left eye: Secondary | ICD-10-CM | POA: Diagnosis not present

## 2019-04-07 NOTE — Progress Notes (Signed)
Assessment/Plan:   1.  [name]  -increase carbidopa/levodopa 25/100 to 7am/11am/3pm/7pm.  May/may not help freezing  2.  Low back pain with lumbar radiculopathy  -Has followed with orthopedics for this in the past.  Told no surgery indicated.  -Uses Ultram as needed.  3.  History of TIA in August, 2020  -Patient currently on aspirin and Plavix.  Not sure needs dual antiplatelet still but given recent event a month ago, didn't change today  4.  L leg weakness x 1 month  -may be due to back issues but going to do MRI brain.  Woke up a month ago with acute onset leg weakness.    -call 911 if similar sx's in future   Subjective:   [place] was seen today in follow up for [name].  Patient was worked in today at the request of his wife.  Patient's wife reported that patient was not doing well and felt it was from Parkinson's disease.  My records as well as outside records have been reviewed.  Patient had IR biliary drain cath placed after chole since our last visit. This was done November 06, 2018. Pt states that he is just isn't walking well - he thinks it is from his "stroke" in august.  Pt initially states that he doesn't think that he recovered well but then he states that the stroke didn't affect his walking.  his wife states that one morning about a morning about a month ago, he got up and couldn't walk.  Pt states that the L leg was just weak.  Wife states that he usually gets up at 7-8am and that morning slept until 10am.  He has been bad since that time.    No L arm weakness just the L leg.  No pain, just weakness.  No numbness.  Wife describes freezing of the LLE.  My previous records were reviewed prior to todays visit as well as outside records available to me.   Current prescribed movement disorder medications: Carbidopa/levodopa 25/100, 1 tablet 3 times per day    ALLERGIES:   Allergies  Allergen Reactions  . Atorvastatin Other (See Comments)   Muscle cramps  . Simvastatin Other (See Comments)    Muscle cramps    CURRENT MEDICATIONS:  Outpatient Encounter Medications as of 04/09/2019  Medication Sig  . aspirin 81 MG tablet Take 81 mg by mouth daily.    . B Complex Vitamins (VITAMIN B COMPLEX PO) Take 1 tablet by mouth every other day.   . clopidogrel (PLAVIX) 75 MG tablet TAKE 1 TABLET EVERY DAY WITH BREAKFAST  . esomeprazole (NEXIUM) 40 MG capsule TAKE 1 CAPSULE BY MOUTH ONCE DAILY  . lovastatin (MEVACOR) 20 MG tablet TAKE 1 TABLET AT BEDTIME  . Multiple Vitamin (MULTIVITAMIN WITH MINERALS) TABS tablet Take 1 tablet by mouth daily.  [name] NITROSTAT 0.4 MG SL tablet PLACE 1 TABLET (0.4 MG TOTAL) UNDER THE TONGUE EVERY 5 (FIVE) MINUTES AS NEEDED.  [name] solifenacin (VESICARE) 5 MG tablet Take 1 tablet (5 mg total) by mouth daily.  [name] tetrahydrozoline-zinc (VISINE-AC) 0.05-0.25 % ophthalmic solution Place 2 drops into both eyes 3 (three) times daily as needed (for burning or eye redness).  [name] tiZANidine (ZANAFLEX) 2 MG tablet TAKE 1 TABLET BY MOUTH EVERY 6 HOURS AS NEEDED FOR MUSCLE SPASMS  . traMADol (ULTRAM) 50 MG tablet TAKE 1 TABLET BY MOUTH EVERY 6 HOURS AS NEEDED FOR PAIN  . [DISCONTINUED] carbidopa-levodopa (SINEMET IR) 25-100 MG  tablet TAKE 1 TABLET THREE TIMES DAILY  . carbidopa-levodopa (SINEMET IR) 25-100 MG tablet 1 tablet  7am/11am/3pm/7pm   No facility-administered encounter medications on file as of 04/09/2019.    Objective:   PHYSICAL EXAMINATION:    VITALS:   Vitals:   04/09/19 1156  BP: (!) 184/101  Pulse: 69  SpO2: 96%  Weight: 147 lb (66.7 kg)  Height: 5\' 4"  (1.626 m)    GEN:  The patient appears stated age and is in NAD. HEENT:  Normocephalic, atraumatic.  The mucous membranes are moist. The superficial temporal arteries are without ropiness or tenderness. CV:  RRR Lungs:  CTAB Neck/HEME:  There are no carotid bruits bilaterally.  Neurological examination:  Orientation: The patient is alert and oriented  x3. Cranial nerves: There is good facial symmetry with facial hypomimia. The speech is fluent and clear. Soft palate rises symmetrically and there is no tongue deviation. Hearing is intact to conversational tone. Sensation: Sensation is intact to light touch throughout Motor: Strength is 5/5 in the bilateral UE and RLE. It is 5-/5 in the LLE  Movement examination: Tone: There is mild increased tone in the bilateral UE Abnormal movements: none Coordination:  There is  decremation with RAM's, with any form of RAMS, including alternating supination and pronation of the forearm, hand opening and closing, finger taps, heel taps and toe taps, L more than R Gait and Station: The patient pushes off of the chair to arise.  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  Patient ambulates with a cane.  He shuffles/is short stepped.  Is unstable in the turn.     Total time spent on today's visit was 30 minutes, including both face-to-face time and nonface-to-face time.  Time included that spent on review of records (prior notes available to me/labs/imaging if pertinent), discussing treatment and goals, answering patient's questions and coordinating care.  Cc:  Biagio Borg, MD

## 2019-04-09 ENCOUNTER — Ambulatory Visit: Payer: Medicare HMO | Admitting: Neurology

## 2019-04-09 ENCOUNTER — Encounter: Payer: Self-pay | Admitting: Neurology

## 2019-04-09 ENCOUNTER — Other Ambulatory Visit: Payer: Self-pay

## 2019-04-09 ENCOUNTER — Telehealth: Payer: Self-pay | Admitting: Neurology

## 2019-04-09 VITALS — BP 184/101 | HR 69 | Ht 64.0 in | Wt 147.0 lb

## 2019-04-09 DIAGNOSIS — G8929 Other chronic pain: Secondary | ICD-10-CM

## 2019-04-09 DIAGNOSIS — R29898 Other symptoms and signs involving the musculoskeletal system: Secondary | ICD-10-CM | POA: Diagnosis not present

## 2019-04-09 DIAGNOSIS — G20 Parkinson's disease: Secondary | ICD-10-CM | POA: Diagnosis not present

## 2019-04-09 DIAGNOSIS — M545 Low back pain: Secondary | ICD-10-CM

## 2019-04-09 MED ORDER — CARBIDOPA-LEVODOPA 25-100 MG PO TABS: ORAL_TABLET | ORAL | 1 refills | Status: DC

## 2019-04-09 NOTE — Patient Instructions (Addendum)
1.  increase carbidopa/levodopa 25/100 to 7am/11am/3pm/7pm  2.  We have sent a referral to [facility] for your MRI and they will call you directly to schedule your appointment. They are located at [place]. If you need to contact them directly please call [phone].

## 2019-04-09 NOTE — Telephone Encounter (Signed)
Patient wife wants to know what the word is for the foot problem patient is having she states it is like he can't pick up his foot it is stuck to the floor

## 2019-04-12 NOTE — Telephone Encounter (Signed)
Its called freezing of gait or magnetic gait

## 2019-04-13 ENCOUNTER — Telehealth: Payer: Self-pay

## 2019-04-13 NOTE — Telephone Encounter (Signed)
Faxed and confirmed medical clearance form to [facility]

## 2019-04-14 DIAGNOSIS — H40033 Anatomical narrow angle, bilateral: Secondary | ICD-10-CM | POA: Diagnosis not present

## 2019-04-14 DIAGNOSIS — H2513 Age-related nuclear cataract, bilateral: Secondary | ICD-10-CM | POA: Diagnosis not present

## 2019-04-14 NOTE — Telephone Encounter (Signed)
Left message on voicemail.

## 2019-04-14 NOTE — Telephone Encounter (Signed)
Forwarding message

## 2019-04-15 ENCOUNTER — Other Ambulatory Visit: Payer: Self-pay

## 2019-04-15 DIAGNOSIS — R29898 Other symptoms and signs involving the musculoskeletal system: Secondary | ICD-10-CM

## 2019-04-15 MED ORDER — DIAZEPAM 5 MG PO TABS: ORAL_TABLET | ORAL | 0 refills | Status: DC

## 2019-04-24 DIAGNOSIS — Z8673 Personal history of transient ischemic attack (TIA), and cerebral infarction without residual deficits: Secondary | ICD-10-CM | POA: Diagnosis not present

## 2019-04-27 ENCOUNTER — Other Ambulatory Visit: Payer: Self-pay | Admitting: Internal Medicine

## 2019-04-27 NOTE — Progress Notes (Signed)
Assessment/Plan:   1.  [name]  -Continue carbidopa/levodopa 25/100, 1 tablet 4 times per day  2.  Low back pain with lumbar radiculopathy  -Has followed with orthopedics in the past.  Told did not need surgery.  3.  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 Leonie Man - ? Need loop recorder?.  Sent Dr. Leonie Man a secure chat about this patient, and called his office and set up an appointment for the patient.  -will do CTA brain given atypical appearance of L vertebral artery  -last LDL 56.  At goal on lovastatin  -do PT at home.  [facility].  Will refer.  -pt has cataract sx scheduled in 2 weeks. Told him I think that he needs to hold on that.  They were very disappointed.   Told him to call cataract surgeon and let him/her know about new infarct    Subjective:   Brandon Rojas was seen today in follow up for [name].  My previous records were reviewed prior to todays visit as well as outside records available to me.  Last visit, I was worried about the fact that the patient was complaining about left leg weakness for about a month.  I thought it potentially could be due to some back issues, but ordered an MRI of the brain just to make sure.  This was scheduled for April 30, but patient canceled the one at [facility] so they can get it done faster and had it done at [facility].  I just got this this morning.  It was apparently done on April 16 and showed a late acute to subacute infarct at the posterior right frontal corona radiata.  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  There were old infarcts in the bilateral thalamus.  This report was called to our office and left on voicemail last night, 3 days after the  examination was completed.  His wife did bring in a CD for me today and I personally reviewed the images.  I also called the [facility] radiologist about this and spoke with him.  Clinically, the patient states that the left leg is still weak, but it is getting better.  He is hopeful to have cataract surgery in a few weeks.  He has been waiting nearly a year to have it done, but it kept getting delayed because of other medical issues.  Current prescribed movement disorder medications: Carbidopa/levodopa 25/100, 1 tablet at 7 AM/11 AM/3 PM/7 PM  ALLERGIES:   Allergies  Allergen Reactions  . Atorvastatin Other (See Comments)    Muscle cramps  . Simvastatin Other (See Comments)    Muscle cramps    CURRENT MEDICATIONS:  Outpatient Encounter Medications as of 04/28/2019  Medication Sig  . aspirin 81 MG tablet Take 81 mg by mouth daily.    . B Complex Vitamins (VITAMIN B COMPLEX PO) Take 1 tablet by mouth as needed.   . carbidopa-levodopa (SINEMET IR) 25-100 MG tablet 1 tablet  7am/11am/3pm/7pm  . clopidogrel (PLAVIX) 75 MG tablet TAKE 1 TABLET EVERY DAY WITH BREAKFAST  . diazepam (VALIUM) 5 MG tablet 1 tab 45 min prior to procedure; may repeat 15 min prior  . esomeprazole (NEXIUM) 40  MG capsule TAKE 1 CAPSULE BY MOUTH ONCE DAILY  . lovastatin (MEVACOR) 20 MG tablet TAKE 1 TABLET AT BEDTIME  . Multiple Vitamin (MULTIVITAMIN WITH MINERALS) TABS tablet Take 1 tablet by mouth daily.  [name] NITROSTAT 0.4 MG SL tablet PLACE 1 TABLET (0.4 MG TOTAL) UNDER THE TONGUE EVERY 5 (FIVE) MINUTES AS NEEDED.  [name] Polyethyl Glycol-Propyl Glycol (SYSTANE ULTRA) 0.4-0.3 % SOLN Apply to eye as directed.  . solifenacin (VESICARE) 5 MG tablet Take 1 tablet (5 mg total) by mouth daily.  [name] tetrahydrozoline-zinc (VISINE-AC) 0.05-0.25 % ophthalmic solution Place 2 drops into both eyes 3 (three) times daily as needed (for burning or eye redness).  [name] tiZANidine (ZANAFLEX) 2 MG tablet TAKE 1 TABLET BY MOUTH EVERY 6 HOURS AS NEEDED  FOR MUSCLE SPASMS  . traMADol (ULTRAM) 50 MG tablet TAKE 1 TABLET BY MOUTH EVERY 6 HOURS AS NEEDED FOR PAIN   No facility-administered encounter medications on file as of 04/28/2019.    Objective:   PHYSICAL EXAMINATION:    VITALS:   Vitals:   04/28/19 1343  BP: (!) 164/99  Pulse: 65  SpO2: 96%  Weight: 143 lb (64.9 kg)  Height: 5\' 4"  (1.626 m)    GEN:  The patient appears stated age and is in NAD. HEENT:  Normocephalic, atraumatic.  The mucous membranes are moist. The superficial temporal arteries are without ropiness or tenderness. CV:  RRR Lungs:  CTAB Neck/HEME:  There are no carotid bruits bilaterally.  Neurological examination:  Orientation: The patient is alert and oriented x3. Cranial nerves: There is good facial symmetry with facial hypomimia. The speech is fluent and clear and very hypophonic. Soft palate rises symmetrically and there is no tongue deviation. Hearing is intact to conversational tone. Sensation: Sensation is intact to light touch throughout Motor: Strength is 5/5 in the bilateral upper and right lower extremity.  Strength is 5 -/5 in the left lower extremity.  There is no pronator drift, however.  Movement examination: Tone: There is normal tone in the upper and lower extremities. Abnormal movements: None Coordination:  There is no decremation with RAM's, but he is slow Gait and Station: The patient pushes off of the chair to arise.  He is stooped at the waist.  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  He ambulates with his cane.  He is short stepped. I have reviewed and interpreted the following labs independently  Lab Results  Component Value Date   CHOL 128 03/09/2019   HDL 52.50 03/09/2019   LDLCALC 56 03/09/2019   TRIG 101.0 03/09/2019   CHOLHDL 2 03/09/2019     Chemistry      Component Value Date/Time   NA 140 03/09/2019 1438   K 4.2 03/09/2019 1438   CL 105 03/09/2019 1438   CO2 28 03/09/2019 1438   BUN 15 03/09/2019 1438   CREATININE  0.99 03/09/2019 1438      Component Value Date/Time   CALCIUM 10.0 03/09/2019 1438   ALKPHOS 70 03/09/2019 1438   AST 16 03/09/2019 1438   ALT 4 03/09/2019 1438   BILITOT 0.5 03/09/2019 1438       Total time spent on today's visit was 40 minutes, including both face-to-face time and nonface-to-face time.  Time included that spent on review of records (prior notes available to me/labs/imaging if pertinent), discussing treatment and goals, answering patient's questions and coordinating care.  Cc:  Biagio Borg, MD

## 2019-04-28 ENCOUNTER — Other Ambulatory Visit: Payer: Self-pay

## 2019-04-28 ENCOUNTER — Ambulatory Visit: Payer: Medicare HMO | Admitting: Neurology

## 2019-04-28 ENCOUNTER — Encounter: Payer: Self-pay | Admitting: Neurology

## 2019-04-28 ENCOUNTER — Telehealth: Payer: Self-pay | Admitting: Neurology

## 2019-04-28 VITALS — BP 164/99 | HR 65 | Ht 64.0 in | Wt 143.0 lb

## 2019-04-28 DIAGNOSIS — G20 Parkinson's disease: Secondary | ICD-10-CM | POA: Diagnosis not present

## 2019-04-28 DIAGNOSIS — I63411 Cerebral infarction due to embolism of right middle cerebral artery: Secondary | ICD-10-CM

## 2019-04-28 DIAGNOSIS — R29898 Other symptoms and signs involving the musculoskeletal system: Secondary | ICD-10-CM | POA: Diagnosis not present

## 2019-04-28 DIAGNOSIS — G20A1 Parkinson's disease without dyskinesia, without mention of fluctuations: Secondary | ICD-10-CM

## 2019-04-28 NOTE — Telephone Encounter (Signed)
Tried to call [facility] but no one picked up.  Had to leave voice mail that scan was done 4 days ago (4/16) and not read until 4/19 and then an acute infarct was found and they left a VM instead of talking to the doc on call.  That is not acceptable and they sent no images for review.

## 2019-04-28 NOTE — Telephone Encounter (Signed)
[facility] called and said to please call provider line at [phone] to speak to radiologist

## 2019-04-28 NOTE — Telephone Encounter (Signed)
Spoke with the radiologist, Dr. Glennie Hawk.  He stated that the scan was done on a Friday and that they often don't read outpatient scans on a weekend.  I pointed out that it actually wasn't read until after hours on Monday, and he said it was because they were backed up and they often are after a weekend.  Only stats are read same day.

## 2019-04-28 NOTE — Telephone Encounter (Signed)
Left message with after hours services on 04/27/2019 at 7:27 pm  MRI of head: Suspicious late acute to sub acute infarct at posterior right frontal radiata. Atypical appearance of intercranial left vertebral artery flow void could be related to small size versus vascular stenosis or interdeterminate occlusion. Could be further evaluated with MRA or CTA. Small old lacunar infarct within bilateral thalami. Moderate degree of nonwhite matter disease within the cerebral hemispheres. This finding can be seen in patients with vascular risk factors and may reflect the sequela of chronic small vessels ischemic change. Performed 04/19@1645 

## 2019-04-28 NOTE — Patient Instructions (Signed)
1.  Dr Tat ordered a CT of your Head once this test is apporved therough your insurance [facility] will contact you to schedule an appt  2.  Dr Tat has referred you back to Dr Leonie Man your appointment is on 05/28/2019 at [facility] but you must arrive by 8:30am  3. Dr Tat has put in a referral to [facility] for home physical therapy they will contact you to set up an appointment.   4. Your physician recommends that you schedule a follow-up appointment in: 3-4 months with Dr Tat.

## 2019-05-01 ENCOUNTER — Telehealth: Payer: Self-pay | Admitting: Neurology

## 2019-05-01 DIAGNOSIS — R29898 Other symptoms and signs involving the musculoskeletal system: Secondary | ICD-10-CM | POA: Diagnosis not present

## 2019-05-01 DIAGNOSIS — Z7982 Long term (current) use of aspirin: Secondary | ICD-10-CM | POA: Diagnosis not present

## 2019-05-01 DIAGNOSIS — Z7902 Long term (current) use of antithrombotics/antiplatelets: Secondary | ICD-10-CM | POA: Diagnosis not present

## 2019-05-01 DIAGNOSIS — M5416 Radiculopathy, lumbar region: Secondary | ICD-10-CM | POA: Diagnosis not present

## 2019-05-01 DIAGNOSIS — I69398 Other sequelae of cerebral infarction: Secondary | ICD-10-CM | POA: Diagnosis not present

## 2019-05-01 DIAGNOSIS — Z9181 History of falling: Secondary | ICD-10-CM | POA: Diagnosis not present

## 2019-05-01 DIAGNOSIS — G20 Parkinson's disease: Secondary | ICD-10-CM | POA: Diagnosis not present

## 2019-05-01 NOTE — Telephone Encounter (Signed)
Patient's wife called requesting an update on patient surgery clearance for 05/11/19. She needs to start him on eyedrops on 05/08/19 prior to the surgery. She said Dr. Carles Collet should have the forms already.

## 2019-05-01 NOTE — Telephone Encounter (Signed)
ok 

## 2019-05-01 NOTE — Telephone Encounter (Signed)
Brandon Rojas from [facility] called requesting a call back with an estimate of when they should push the eye surgery out to.

## 2019-05-01 NOTE — Telephone Encounter (Signed)
Spoke with pt and wife informed Per Dr Tat "I'm sorry.  As I mentioned to them at the visit, I don't recommend the surgery right now and cannot provide that clearance given the recent stroke." Pt and wife not happy with this answer but said ok and got off the phone,

## 2019-05-01 NOTE — Telephone Encounter (Signed)
I'm sorry.  As I mentioned to them at the visit, I don't recommend the surgery right now and cannot provide that clearance given the recent stroke.

## 2019-05-01 NOTE — Telephone Encounter (Signed)
Brandon Rojas from [facility] called requesting verbal orders for strengthening and balance: 1 time week 1, and 2 times for 8 weeks.

## 2019-05-01 NOTE — Telephone Encounter (Signed)
Brandon Rojas from [place] called no answer voice mail left for her to call back

## 2019-05-04 NOTE — Telephone Encounter (Signed)
Verbal orders given  

## 2019-05-04 NOTE — Telephone Encounter (Signed)
Spoke with Brandon Rojas at [facility]  and made her aware of Dr Arturo Morton recommendations. She voiced understanding and thanked me for returning her call.

## 2019-05-04 NOTE — Telephone Encounter (Signed)
Pt will be seeing Dr. Leonie Man soon for stroke management and he can provide that recommendation.

## 2019-05-05 ENCOUNTER — Other Ambulatory Visit: Payer: Self-pay | Admitting: Internal Medicine

## 2019-05-05 DIAGNOSIS — Z7902 Long term (current) use of antithrombotics/antiplatelets: Secondary | ICD-10-CM | POA: Diagnosis not present

## 2019-05-05 DIAGNOSIS — Z7982 Long term (current) use of aspirin: Secondary | ICD-10-CM | POA: Diagnosis not present

## 2019-05-05 DIAGNOSIS — I69398 Other sequelae of cerebral infarction: Secondary | ICD-10-CM | POA: Diagnosis not present

## 2019-05-05 DIAGNOSIS — M5416 Radiculopathy, lumbar region: Secondary | ICD-10-CM | POA: Diagnosis not present

## 2019-05-05 DIAGNOSIS — R29898 Other symptoms and signs involving the musculoskeletal system: Secondary | ICD-10-CM | POA: Diagnosis not present

## 2019-05-05 DIAGNOSIS — G20 Parkinson's disease: Secondary | ICD-10-CM | POA: Diagnosis not present

## 2019-05-05 DIAGNOSIS — Z9181 History of falling: Secondary | ICD-10-CM | POA: Diagnosis not present

## 2019-05-07 DIAGNOSIS — Z7982 Long term (current) use of aspirin: Secondary | ICD-10-CM | POA: Diagnosis not present

## 2019-05-07 DIAGNOSIS — M5416 Radiculopathy, lumbar region: Secondary | ICD-10-CM | POA: Diagnosis not present

## 2019-05-07 DIAGNOSIS — Z9181 History of falling: Secondary | ICD-10-CM | POA: Diagnosis not present

## 2019-05-07 DIAGNOSIS — Z7902 Long term (current) use of antithrombotics/antiplatelets: Secondary | ICD-10-CM | POA: Diagnosis not present

## 2019-05-07 DIAGNOSIS — I69398 Other sequelae of cerebral infarction: Secondary | ICD-10-CM | POA: Diagnosis not present

## 2019-05-07 DIAGNOSIS — G20 Parkinson's disease: Secondary | ICD-10-CM | POA: Diagnosis not present

## 2019-05-07 DIAGNOSIS — R29898 Other symptoms and signs involving the musculoskeletal system: Secondary | ICD-10-CM | POA: Diagnosis not present

## 2019-05-08 ENCOUNTER — Other Ambulatory Visit: Payer: Medicare HMO

## 2019-05-11 ENCOUNTER — Telehealth: Payer: Self-pay

## 2019-05-11 NOTE — Telephone Encounter (Signed)
I called [facility] and spoke with Helene Kelp. I stated pt will be new to Dr Leonie Man on for a stroke consult and has never seen pt. Helene Kelp stated D.Tat office stated we will do the clearance form. I stated per Dr .Leonie Man he had a stroke that showed on scan in April and pt needs to wait for cataract surgery.I stated typically pts wait 3 to 6 months based on what procedure is getting. Helene Kelp verbalized understanding.

## 2019-05-14 ENCOUNTER — Ambulatory Visit
Admission: RE | Admit: 2019-05-14 | Discharge: 2019-05-14 | Disposition: A | Discharge status: Home / Self Care | Payer: Medicare HMO | Source: Ambulatory Visit | Attending: Neurology | Admitting: Neurology

## 2019-05-14 DIAGNOSIS — I6502 Occlusion and stenosis of left vertebral artery: Secondary | ICD-10-CM | POA: Diagnosis not present

## 2019-05-14 MED ORDER — IOPAMIDOL (ISOVUE-370) INJECTION 76%
75.0000 mL | Freq: Once | INTRAVENOUS | Status: AC | PRN
  Administered 2019-05-14: 12:00:00 75 mL via INTRAVENOUS

## 2019-05-15 DIAGNOSIS — Z7902 Long term (current) use of antithrombotics/antiplatelets: Secondary | ICD-10-CM | POA: Diagnosis not present

## 2019-05-15 DIAGNOSIS — R29898 Other symptoms and signs involving the musculoskeletal system: Secondary | ICD-10-CM | POA: Diagnosis not present

## 2019-05-15 DIAGNOSIS — M5416 Radiculopathy, lumbar region: Secondary | ICD-10-CM | POA: Diagnosis not present

## 2019-05-15 DIAGNOSIS — G20 Parkinson's disease: Secondary | ICD-10-CM | POA: Diagnosis not present

## 2019-05-15 DIAGNOSIS — Z9181 History of falling: Secondary | ICD-10-CM | POA: Diagnosis not present

## 2019-05-15 DIAGNOSIS — I69398 Other sequelae of cerebral infarction: Secondary | ICD-10-CM | POA: Diagnosis not present

## 2019-05-15 DIAGNOSIS — Z7982 Long term (current) use of aspirin: Secondary | ICD-10-CM | POA: Diagnosis not present

## 2019-05-18 DIAGNOSIS — Z9181 History of falling: Secondary | ICD-10-CM | POA: Diagnosis not present

## 2019-05-18 DIAGNOSIS — Z7982 Long term (current) use of aspirin: Secondary | ICD-10-CM | POA: Diagnosis not present

## 2019-05-18 DIAGNOSIS — M5416 Radiculopathy, lumbar region: Secondary | ICD-10-CM | POA: Diagnosis not present

## 2019-05-18 DIAGNOSIS — I69398 Other sequelae of cerebral infarction: Secondary | ICD-10-CM | POA: Diagnosis not present

## 2019-05-18 DIAGNOSIS — G20 Parkinson's disease: Secondary | ICD-10-CM | POA: Diagnosis not present

## 2019-05-18 DIAGNOSIS — Z7902 Long term (current) use of antithrombotics/antiplatelets: Secondary | ICD-10-CM | POA: Diagnosis not present

## 2019-05-18 DIAGNOSIS — R29898 Other symptoms and signs involving the musculoskeletal system: Secondary | ICD-10-CM | POA: Diagnosis not present

## 2019-05-21 DIAGNOSIS — R29898 Other symptoms and signs involving the musculoskeletal system: Secondary | ICD-10-CM | POA: Diagnosis not present

## 2019-05-21 DIAGNOSIS — Z7982 Long term (current) use of aspirin: Secondary | ICD-10-CM | POA: Diagnosis not present

## 2019-05-21 DIAGNOSIS — M5416 Radiculopathy, lumbar region: Secondary | ICD-10-CM | POA: Diagnosis not present

## 2019-05-21 DIAGNOSIS — I69398 Other sequelae of cerebral infarction: Secondary | ICD-10-CM | POA: Diagnosis not present

## 2019-05-21 DIAGNOSIS — Z7902 Long term (current) use of antithrombotics/antiplatelets: Secondary | ICD-10-CM | POA: Diagnosis not present

## 2019-05-21 DIAGNOSIS — G20 Parkinson's disease: Secondary | ICD-10-CM | POA: Diagnosis not present

## 2019-05-21 DIAGNOSIS — Z9181 History of falling: Secondary | ICD-10-CM | POA: Diagnosis not present

## 2019-05-26 DIAGNOSIS — I69398 Other sequelae of cerebral infarction: Secondary | ICD-10-CM | POA: Diagnosis not present

## 2019-05-26 DIAGNOSIS — Z7902 Long term (current) use of antithrombotics/antiplatelets: Secondary | ICD-10-CM | POA: Diagnosis not present

## 2019-05-26 DIAGNOSIS — R29898 Other symptoms and signs involving the musculoskeletal system: Secondary | ICD-10-CM | POA: Diagnosis not present

## 2019-05-26 DIAGNOSIS — Z7982 Long term (current) use of aspirin: Secondary | ICD-10-CM | POA: Diagnosis not present

## 2019-05-26 DIAGNOSIS — M5416 Radiculopathy, lumbar region: Secondary | ICD-10-CM | POA: Diagnosis not present

## 2019-05-26 DIAGNOSIS — G20 Parkinson's disease: Secondary | ICD-10-CM | POA: Diagnosis not present

## 2019-05-26 DIAGNOSIS — Z9181 History of falling: Secondary | ICD-10-CM | POA: Diagnosis not present

## 2019-05-28 ENCOUNTER — Ambulatory Visit: Payer: Medicare HMO | Admitting: Neurology

## 2019-05-28 ENCOUNTER — Other Ambulatory Visit: Payer: Self-pay

## 2019-05-28 ENCOUNTER — Encounter: Payer: Self-pay | Admitting: Neurology

## 2019-05-28 VITALS — BP 163/76 | HR 60 | Ht 62.0 in | Wt 145.2 lb

## 2019-05-28 DIAGNOSIS — I6381 Other cerebral infarction due to occlusion or stenosis of small artery: Secondary | ICD-10-CM

## 2019-05-28 DIAGNOSIS — I2510 Atherosclerotic heart disease of native coronary artery without angina pectoris: Secondary | ICD-10-CM | POA: Diagnosis not present

## 2019-05-28 DIAGNOSIS — I6502 Occlusion and stenosis of left vertebral artery: Secondary | ICD-10-CM | POA: Diagnosis not present

## 2019-05-28 NOTE — Progress Notes (Signed)
[facility] [address] [place] [address] [phone]       OFFICE CONSULT NOTE  Brandon Rojas Date of Birth:  08-13-1945 Medical Record Number:  [id number]   Referring MD: Wells Guiles Tat  Reason for Referral: Stroke second opinion HPI: Mr. Halpin is a pleasant 74 year old Caucasian male seen today for follow-up second opinion consult for stroke upon request from Dr. Carles Collet.  He is accompanied by his wife.  History is obtained from them, review of electronic medical records and I personally reviewed imaging films in PACS.  He has past medical history of Parkinson's disease, coronary artery disease, osteoarthritis.  He states that on 02/26/2019 he woke up from sleep and noticed his left leg was weak.  He has more trouble walking beyond his baseline and needed help using a walker as well as gait belt is felt unsteady.  His symptoms gradually improve over the next few days and he has been getting some home physical therapy which seems to have helped.  He feels now is walking and leg strength have returned back to baseline.  He had an MRI scan of the brain done on 04/09/2019 at [facility] which showed a small right frontal lacunar infarct in the subcortical coronary radiator.  Remote age bilateral thalamic lacunar infarcts are also noted.  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  Lab work on 03/09/2019 showed LDL cholesterol to be optimal at 56 mg percent.  Transthoracic echo on 08/27/2018 showed normal ejection fraction.  Patient has not had any recent evaluation of his extracranial vessels.  He has been on aspirin and Plavix long-term due to having coronary artery disease and cardiac stent.  Is tolerating  these medications well without bruising or bleeding.  He has had no recent falls.  Is tolerating Mevacor well without muscle aches and pains.  His Parkinson symptoms seem quite well controlled on the current medication regimen of Sinemet 25/100 mg  tablet 4 times a day.  He has no new complaints today.  He has a pending cataract surgery for later this month and is asking for neurological clearance.  He and his wife assure me that he does not have to stop aspirin or Plavix for the surgery.  He denies any other prior history of strokes ,TIAs or seizures. ROS:   14 system review of systems is positive for leg weakness, walking difficulty, imbalance and unsteady gait all other systems negative  PMH:  Past Medical History:  Diagnosis Date  . Anxiety state, unspecified   . Arthritis   . Benign neoplasm of colon   . CAD (coronary artery disease) of artery bypass graft 05/20/2018  . Coronary atherosclerosis of unspecified type of vessel, native or graft   . Depressive disorder, not elsewhere classified   . Diaphragmatic hernia without mention of obstruction or gangrene   . Diverticulosis of colon (without mention of hemorrhage)   . Elevated troponin 05/20/2018  . Epigastric pain 05/20/2018  . Esophageal reflux   . Esophageal stricture   . Hiatal hernia   . Mitral regurgitation   . Osteoarthrosis, unspecified whether generalized or localized, unspecified site   . Other and unspecified hyperlipidemia   . Parkinson's disease ([place])   . Personal  history of colonic polyps   . Personal history of unspecified circulatory disease   . Stroke ([place])   . Unspecified essential hypertension     Social History:  Social History   Socioeconomic History  . Marital status: Married    Spouse name: Not on file  . Number of children: 3  . Years of education: Not on file  . Highest education level: Doctorate  Occupational History  . 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  Smoking status: Former Smoker    Quit date: 02/19/1972    Years since quitting: 47.3  . Smokeless tobacco: Never Used  Substance and Sexual Activity  . Alcohol use: No    Alcohol/week: 0.0 standard drinks  . Drug use: No  . Sexual activity: Not on file  Other Topics Concern  . Not on file  Social History Narrative  . Not on file   Social Determinants of Health   Financial Resource Strain:   . Difficulty of Paying Living Expenses:   Food Insecurity:   . Worried About Charity fundraiser in the Last Year:   . Arboriculturist in the Last Year:   Transportation Needs:   . Film/video editor (Medical):   [name] Lack of Transportation (Non-Medical):   Physical Activity:   . Days of Exercise per Week:   . Minutes of Exercise per Session:   Stress:   . Feeling of Stress :   Social Connections:   . Frequency of Communication with Friends and Family:   . Frequency of Social Gatherings with Friends and Family:   . Attends Religious Services:   . Active Member of Clubs or Organizations:   . Attends Archivist Meetings:   [name] Marital Status:   Intimate Partner Violence:   . Fear of Current or Ex-Partner:   . Emotionally Abused:   [name] Physically Abused:   . Sexually Abused:     Medications:   Current Outpatient Medications on File Prior to Visit  Medication Sig Dispense Refill  . aspirin 81 MG tablet Take 81 mg by mouth daily.      . B Complex Vitamins (VITAMIN B COMPLEX PO) Take 1 tablet by mouth as needed.     . carbidopa-levodopa (SINEMET IR) 25-100 MG tablet 1 tablet  7am/11am/3pm/7pm 360 tablet 1  . clopidogrel (PLAVIX) 75 MG tablet TAKE 1 TABLET EVERY DAY WITH BREAKFAST 90 tablet 1  . diazepam (VALIUM) 5 MG tablet 1 tab 45 min prior to procedure; may repeat 15 min prior 3 tablet 0  . esomeprazole (NEXIUM) 40 MG capsule TAKE 1 CAPSULE BY MOUTH ONCE DAILY 90 capsule 3  . lovastatin (MEVACOR) 20 MG tablet TAKE 1 TABLET AT BEDTIME 90 tablet 1  . Multiple Vitamin  (MULTIVITAMIN WITH MINERALS) TABS tablet Take 1 tablet by mouth daily.    [name] NITROSTAT 0.4 MG SL tablet PLACE 1 TABLET (0.4 MG TOTAL) UNDER THE TONGUE EVERY 5 (FIVE) MINUTES AS NEEDED. 25 tablet 5  . Polyethyl Glycol-Propyl Glycol (SYSTANE ULTRA) 0.4-0.3 % SOLN Apply to eye as directed.    . solifenacin (VESICARE) 5 MG tablet Take 1 tablet (5 mg total) by mouth daily. 90 tablet 3  . tetrahydrozoline-zinc (VISINE-AC) 0.05-0.25 % ophthalmic solution Place 2 drops into both eyes 3 (three) times daily as needed (for burning or eye redness).    [name] tiZANidine (ZANAFLEX) 2 MG tablet TAKE 1 TABLET BY MOUTH EVERY 6 HOURS AS  NEEDED FOR MUSCLE SPASMS 40 tablet 1  . traMADol (ULTRAM) 50 MG tablet TAKE 1 TABLET BY MOUTH EVERY 6 HOURS AS NEEDED FOR PAIN 120 tablet 5   No current facility-administered medications on file prior to visit.    Allergies:   Allergies  Allergen Reactions  . Atorvastatin Other (See Comments)    Muscle cramps  . Simvastatin Other (See Comments)    Muscle cramps    Physical Exam General: Frail elderly Caucasian male, seated, in no evident distress Head: head normocephalic and atraumatic.   Neck: supple with no carotid or supraclavicular bruits Cardiovascular: regular rate and rhythm, no murmurs Musculoskeletal: Kyphoscoliosis moderate. Skin:  no rash/petichiae Vascular:  Normal pulses all extremities  Neurologic Exam Mental Status: Awake and fully alert. Oriented to place and time. Recent and remote memory intact. Attention span, concentration and fund of knowledge appropriate. Mood and affect appropriate.  Positive glabellar tap.  Diminished facial expression. Cranial Nerves: Fundoscopic exam reveals sharp disc margins. Pupils equal, briskly reactive to light. Extraocular movements full without nystagmus. Visual fields full to confrontation. Hearing intact. Facial sensation intact.  Voice is hypophonic.  Mild left lower facial asymmetry when he smiles., tongue, palate moves  normally and symmetrically.  Motor: Normal bulk and tone. Normal strength in all tested extremity muscles.  Mild diminished fine finger movements on the left.  Orbits right over left upper extremity.  Finger tapping is slow bilaterally but no discrimination no cogwheel rigidity even with activation Sensory.: intact to touch , pinprick , position and vibratory sensation.  Coordination: Rapid alternating movements normal in all extremities. Finger-to-nose and heel-to-shin performed accurately bilaterally. Gait and Station: Arises from chair with slight difficulty. Stance is stooped. gait demonstrates short steps but no festination or retropulsion..    Reflexes: 1+ and symmetric. Toes downgoing.   NIHSS 1 Modified Rankin  2   ASSESSMENT: 74 year old Caucasian male with left leg weakness in February 2021 secondary to right frontal subcortical lacunar infarct from small vessel disease.  MRI also shows previous silent bilateral thalamic lacunar infarcts from small vessel disease.  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  Also history of Parkinson's disease well-controlled on current medication regimen of Sinemet    PLAN:  I had a long d/w patient and his wife about his recent lacunar stroke, risk for recurrent stroke/TIAs, personally independently reviewed imaging studies and stroke evaluation results and answered questions.Continue aspirin 81 mg daily as well as Plavix 75 mg daily for secondary stroke prevention given history of cardiac stents and maintain strict control of hypertension with blood pressure goal below 130/90, diabetes with hemoglobin A1c goal below 6.5% and lipids with LDL cholesterol goal below 70 mg/dL. I also advised the patient to eat a healthy diet with plenty of whole grains, cereals, fruits  and vegetables, exercise regularly and maintain ideal body weight .check hemoglobin A1c and carotid ultrasound.  I do not believe prolonged cardiac monitoring is necessary as patient's recent as well as previous silent strokes were all lacunar in nature.  Continue Sinemet for his Parkinson's and follow-up with Dr. Carles Collet for the same.  Patient is neurologically cleared for upcoming cataract surgery since his stroke was 3 months ago and also   I have been told that cataract surgery can be performed while continuing aspirin and Plavix.  Greater than 50% time during this 50-minute consultation visit  was spent on counseling and coordination of care about his lacunar strokes and discussion about stroke prevention and treatment and answering questions followup in the future with me only if necessary and no schedule appointment was made. Antony Contras, MD  [facility] [address] [place], [place] [address]  Phone [phone] Fax [phone] Note: This document was prepared with digital dictation and possible smart phrase technology. Any transcriptional errors that result from this process are unintentional.

## 2019-05-28 NOTE — Patient Instructions (Signed)
I had a long d/w patient and his wife about his recent lacunar stroke, risk for recurrent stroke/TIAs, personally independently reviewed imaging studies and stroke evaluation results and answered questions.Continue aspirin 81 mg daily as well as Plavix 75 mg daily for secondary stroke prevention given history of cardiac stents and maintain strict control of hypertension with blood pressure goal below 130/90, diabetes with hemoglobin A1c goal below 6.5% and lipids with LDL cholesterol goal below 70 mg/dL. I also advised the patient to eat a healthy diet with plenty of whole grains, cereals, fruits and vegetables, exercise regularly and maintain ideal body weight .check hemoglobin A1c and carotid ultrasound.  I do not believe prolonged cardiac monitoring is necessary as patient's recent as well as previous silent strokes were all lacunar in nature.  Continue Sinemet for his Parkinson's and follow-up with Dr. Carles Collet for the same.  Patient is neurologically cleared for upcoming cataract surgery which I have been told can be performed while continuing aspirin and Plavix.  Followup in the future with me only if necessary and no schedule appointment was made.  Stroke Prevention Some medical conditions and behaviors are associated with a higher chance of having a stroke. You can help prevent a stroke by making nutrition, lifestyle, and other changes, including managing any medical conditions you may have. What nutrition changes can be made?   Eat healthy foods. You can do this by: ? Choosing foods high in fiber, such as fresh fruits and vegetables and whole grains. ? Eating at least 5 or more servings of fruits and vegetables a day. Try to fill half of your plate at each meal with fruits and vegetables. ? Choosing lean protein foods, such as lean cuts of meat, poultry without skin, fish, tofu, beans, and nuts. ? Eating low-fat dairy products. ? Avoiding foods that are high in salt (sodium). This can help lower  blood pressure. ? Avoiding foods that have saturated fat, trans fat, and cholesterol. This can help prevent high cholesterol. ? Avoiding processed and premade foods.  Follow your health care provider's specific guidelines for losing weight, controlling high blood pressure (hypertension), lowering high cholesterol, and managing diabetes. These may include: ? Reducing your daily calorie intake. ? Limiting your daily sodium intake to 1,500 milligrams (mg). ? Using only healthy fats for cooking, such as olive oil, canola oil, or sunflower oil. ? Counting your daily carbohydrate intake. What lifestyle changes can be made?  Maintain a healthy weight. Talk to your health care provider about your ideal weight.  Get at least 30 minutes of moderate physical activity at least 5 days a week. Moderate activity includes brisk walking, biking, and swimming.  Do not use any products that contain nicotine or tobacco, such as cigarettes and e-cigarettes. If you need help quitting, ask your health care provider. It may also be helpful to avoid exposure to secondhand smoke.  Limit alcohol intake to no more than 1 drink a day for nonpregnant women and 2 drinks a day for men. One drink equals 12 oz of beer, 5 oz of wine, or 1 oz of hard liquor.  Stop any illegal drug use.  Avoid taking birth control pills. Talk to your health care provider about the risks of taking birth control pills if: ? You are over 50 years old. ? You smoke. ? You get migraines. ? You have ever had a blood clot. What other changes can be made?  Manage your cholesterol levels. ? Eating a healthy diet is important for preventing  high cholesterol. If cholesterol cannot be managed through diet alone, you may also need to take medicines. ? Take any prescribed medicines to control your cholesterol as told by your health care provider.  Manage your diabetes. ? Eating a healthy diet and exercising regularly are important parts of managing  your blood sugar. If your blood sugar cannot be managed through diet and exercise, you may need to take medicines. ? Take any prescribed medicines to control your diabetes as told by your health care provider.  Control your hypertension. ? To reduce your risk of stroke, try to keep your blood pressure below 130/80. ? Eating a healthy diet and exercising regularly are an important part of controlling your blood pressure. If your blood pressure cannot be managed through diet and exercise, you may need to take medicines. ? Take any prescribed medicines to control hypertension as told by your health care provider. ? Ask your health care provider if you should monitor your blood pressure at home. ? Have your blood pressure checked every year, even if your blood pressure is normal. Blood pressure increases with age and some medical conditions.  Get evaluated for sleep disorders (sleep apnea). Talk to your health care provider about getting a sleep evaluation if you snore a lot or have excessive sleepiness.  Take over-the-counter and prescription medicines only as told by your health care provider. Aspirin or blood thinners (antiplatelets or anticoagulants) may be recommended to reduce your risk of forming blood clots that can lead to stroke.  Make sure that any other medical conditions you have, such as atrial fibrillation or atherosclerosis, are managed. What are the warning signs of a stroke? The warning signs of a stroke can be easily remembered as BEFAST.  B is for balance. Signs include: ? Dizziness. ? Loss of balance or coordination. ? Sudden trouble walking.  E is for eyes. Signs include: ? A sudden change in vision. ? Trouble seeing.  F is for face. Signs include: ? Sudden weakness or numbness of the face. ? The face or eyelid drooping to one side.  A is for arms. Signs include: ? Sudden weakness or numbness of the arm, usually on one side of the body.  S is for speech. Signs  include: ? Trouble speaking (aphasia). ? Trouble understanding.  T is for time. ? These symptoms may represent a serious problem that is an emergency. Do not wait to see if the symptoms will go away. Get medical help right away. Call your local emergency services (911 in the [place] Do not drive yourself to the hospital.  Other signs of stroke may include: ? A sudden, severe headache with no known cause. ? Nausea or vomiting. ? Seizure. Where to find more information For more information, visit:  American Stroke Association: www.strokeassociation.org  National Stroke Association: www.stroke.org Summary  You can prevent a stroke by eating healthy, exercising, not smoking, limiting alcohol intake, and managing any medical conditions you may have.  Do not use any products that contain nicotine or tobacco, such as cigarettes and e-cigarettes. If you need help quitting, ask your health care provider. It may also be helpful to avoid exposure to secondhand smoke.  Remember BEFAST for warning signs of stroke. Get help right away if you or a loved one has any of these signs. This information is not intended to replace advice given to you by your health care provider. Make sure you discuss any questions you have with your health care provider. Document Revised: 12/07/2016 Document  Reviewed: 01/31/2016 Elsevier Patient Education  [employer].

## 2019-05-29 LAB — HEMOGLOBIN A1C
Est. average glucose Bld gHb Est-mCnc: 111 mg/dL
Hgb A1c MFr Bld: 5.5 % (ref 4.8–5.6)

## 2019-05-31 DIAGNOSIS — G20 Parkinson's disease: Secondary | ICD-10-CM | POA: Diagnosis not present

## 2019-05-31 DIAGNOSIS — Z7902 Long term (current) use of antithrombotics/antiplatelets: Secondary | ICD-10-CM | POA: Diagnosis not present

## 2019-05-31 DIAGNOSIS — Z7982 Long term (current) use of aspirin: Secondary | ICD-10-CM | POA: Diagnosis not present

## 2019-05-31 DIAGNOSIS — I69398 Other sequelae of cerebral infarction: Secondary | ICD-10-CM | POA: Diagnosis not present

## 2019-05-31 DIAGNOSIS — M5416 Radiculopathy, lumbar region: Secondary | ICD-10-CM | POA: Diagnosis not present

## 2019-05-31 DIAGNOSIS — R29898 Other symptoms and signs involving the musculoskeletal system: Secondary | ICD-10-CM | POA: Diagnosis not present

## 2019-05-31 DIAGNOSIS — Z9181 History of falling: Secondary | ICD-10-CM | POA: Diagnosis not present

## 2019-06-01 ENCOUNTER — Telehealth: Payer: Self-pay

## 2019-06-01 DIAGNOSIS — H2511 Age-related nuclear cataract, right eye: Secondary | ICD-10-CM | POA: Diagnosis not present

## 2019-06-01 NOTE — Telephone Encounter (Signed)
Clearance form fax to [facility] twice to [phone].PT on plavix and aspirin and per form he does not need to stop the medications for this procedure. Form fax twice and confirmed.

## 2019-06-02 ENCOUNTER — Encounter (HOSPITAL_COMMUNITY): Payer: Medicare HMO

## 2019-06-02 DIAGNOSIS — H25012 Cortical age-related cataract, left eye: Secondary | ICD-10-CM | POA: Diagnosis not present

## 2019-06-02 DIAGNOSIS — H25042 Posterior subcapsular polar age-related cataract, left eye: Secondary | ICD-10-CM | POA: Diagnosis not present

## 2019-06-02 DIAGNOSIS — H2512 Age-related nuclear cataract, left eye: Secondary | ICD-10-CM | POA: Diagnosis not present

## 2019-06-02 NOTE — Progress Notes (Signed)
Kindly let patient know that screening lab work for diabetes was satisfactory.  Nothing to worry about

## 2019-06-05 ENCOUNTER — Encounter (HOSPITAL_COMMUNITY): Payer: Medicare HMO

## 2019-06-05 ENCOUNTER — Telehealth: Payer: Self-pay

## 2019-06-05 NOTE — Telephone Encounter (Signed)
Received voicemail from [place], physical therapist at [facility]. He stated the patient recently had Cataract surgery and declined all his for this week due to him his not feeling well and having a lot of follow up appointments this week. Patient stated he would pick up services next week. He stated he was calling to make Dr Tat aware.

## 2019-06-10 DIAGNOSIS — Z7902 Long term (current) use of antithrombotics/antiplatelets: Secondary | ICD-10-CM | POA: Diagnosis not present

## 2019-06-10 DIAGNOSIS — M5416 Radiculopathy, lumbar region: Secondary | ICD-10-CM | POA: Diagnosis not present

## 2019-06-10 DIAGNOSIS — I69398 Other sequelae of cerebral infarction: Secondary | ICD-10-CM | POA: Diagnosis not present

## 2019-06-10 DIAGNOSIS — G20 Parkinson's disease: Secondary | ICD-10-CM | POA: Diagnosis not present

## 2019-06-10 DIAGNOSIS — Z7982 Long term (current) use of aspirin: Secondary | ICD-10-CM | POA: Diagnosis not present

## 2019-06-10 DIAGNOSIS — R29898 Other symptoms and signs involving the musculoskeletal system: Secondary | ICD-10-CM | POA: Diagnosis not present

## 2019-06-10 DIAGNOSIS — Z9181 History of falling: Secondary | ICD-10-CM | POA: Diagnosis not present

## 2019-06-12 DIAGNOSIS — M5416 Radiculopathy, lumbar region: Secondary | ICD-10-CM | POA: Diagnosis not present

## 2019-06-12 DIAGNOSIS — Z7902 Long term (current) use of antithrombotics/antiplatelets: Secondary | ICD-10-CM | POA: Diagnosis not present

## 2019-06-12 DIAGNOSIS — Z7982 Long term (current) use of aspirin: Secondary | ICD-10-CM | POA: Diagnosis not present

## 2019-06-12 DIAGNOSIS — G20 Parkinson's disease: Secondary | ICD-10-CM | POA: Diagnosis not present

## 2019-06-12 DIAGNOSIS — R29898 Other symptoms and signs involving the musculoskeletal system: Secondary | ICD-10-CM | POA: Diagnosis not present

## 2019-06-12 DIAGNOSIS — Z9181 History of falling: Secondary | ICD-10-CM | POA: Diagnosis not present

## 2019-06-12 DIAGNOSIS — I69398 Other sequelae of cerebral infarction: Secondary | ICD-10-CM | POA: Diagnosis not present

## 2019-06-16 ENCOUNTER — Ambulatory Visit (HOSPITAL_COMMUNITY)
Admission: RE | Admit: 2019-06-16 | Discharge: 2019-06-16 | Disposition: A | Discharge status: Home / Self Care | Payer: Medicare HMO | Source: Ambulatory Visit | Attending: Neurology | Admitting: Neurology

## 2019-06-16 ENCOUNTER — Other Ambulatory Visit: Payer: Self-pay

## 2019-06-16 DIAGNOSIS — G20 Parkinson's disease: Secondary | ICD-10-CM | POA: Diagnosis not present

## 2019-06-16 DIAGNOSIS — M5416 Radiculopathy, lumbar region: Secondary | ICD-10-CM | POA: Diagnosis not present

## 2019-06-16 DIAGNOSIS — I6381 Other cerebral infarction due to occlusion or stenosis of small artery: Secondary | ICD-10-CM | POA: Insufficient documentation

## 2019-06-16 DIAGNOSIS — Z9181 History of falling: Secondary | ICD-10-CM | POA: Diagnosis not present

## 2019-06-16 DIAGNOSIS — R29898 Other symptoms and signs involving the musculoskeletal system: Secondary | ICD-10-CM | POA: Diagnosis not present

## 2019-06-16 DIAGNOSIS — Z7902 Long term (current) use of antithrombotics/antiplatelets: Secondary | ICD-10-CM | POA: Diagnosis not present

## 2019-06-16 DIAGNOSIS — Z7982 Long term (current) use of aspirin: Secondary | ICD-10-CM | POA: Diagnosis not present

## 2019-06-16 DIAGNOSIS — I69398 Other sequelae of cerebral infarction: Secondary | ICD-10-CM | POA: Diagnosis not present

## 2019-06-16 NOTE — Progress Notes (Signed)
Carotid artery duplex completed. Refer to "CV Proc" under chart review to view preliminary results.  06/16/2019 10:45 AM Kelby Aline., MHA, RVT, RDCS, RDMS

## 2019-06-18 DIAGNOSIS — I69398 Other sequelae of cerebral infarction: Secondary | ICD-10-CM | POA: Diagnosis not present

## 2019-06-18 DIAGNOSIS — Z7902 Long term (current) use of antithrombotics/antiplatelets: Secondary | ICD-10-CM | POA: Diagnosis not present

## 2019-06-18 DIAGNOSIS — G20 Parkinson's disease: Secondary | ICD-10-CM | POA: Diagnosis not present

## 2019-06-18 DIAGNOSIS — Z9181 History of falling: Secondary | ICD-10-CM | POA: Diagnosis not present

## 2019-06-18 DIAGNOSIS — R29898 Other symptoms and signs involving the musculoskeletal system: Secondary | ICD-10-CM | POA: Diagnosis not present

## 2019-06-18 DIAGNOSIS — M5416 Radiculopathy, lumbar region: Secondary | ICD-10-CM | POA: Diagnosis not present

## 2019-06-18 DIAGNOSIS — Z7982 Long term (current) use of aspirin: Secondary | ICD-10-CM | POA: Diagnosis not present

## 2019-06-22 DIAGNOSIS — H2512 Age-related nuclear cataract, left eye: Secondary | ICD-10-CM | POA: Diagnosis not present

## 2019-06-23 DIAGNOSIS — H2512 Age-related nuclear cataract, left eye: Secondary | ICD-10-CM | POA: Diagnosis not present

## 2019-06-24 ENCOUNTER — Telehealth: Payer: Self-pay

## 2019-06-24 NOTE — Telephone Encounter (Signed)
Received voicemail from [place] at [facility]. She states is working with this patient and needs to report a missed visit.   Call back number [phone].

## 2019-06-24 NOTE — Telephone Encounter (Signed)
Left detailed message on Brandon Rojas's voicemail informing her that we did receive her message and I will make Dr Tat aware. Advised her to contact the office with any questions or concerns.

## 2019-06-26 NOTE — Progress Notes (Signed)
Kindly inform the patient that carotid ultrasound study showed no significant narrowing of either carotid arteries in the neck on both sides

## 2019-06-29 DIAGNOSIS — Z7982 Long term (current) use of aspirin: Secondary | ICD-10-CM | POA: Diagnosis not present

## 2019-06-29 DIAGNOSIS — Z9181 History of falling: Secondary | ICD-10-CM | POA: Diagnosis not present

## 2019-06-29 DIAGNOSIS — G20 Parkinson's disease: Secondary | ICD-10-CM | POA: Diagnosis not present

## 2019-06-29 DIAGNOSIS — I69398 Other sequelae of cerebral infarction: Secondary | ICD-10-CM | POA: Diagnosis not present

## 2019-06-29 DIAGNOSIS — Z7902 Long term (current) use of antithrombotics/antiplatelets: Secondary | ICD-10-CM | POA: Diagnosis not present

## 2019-06-29 DIAGNOSIS — R29898 Other symptoms and signs involving the musculoskeletal system: Secondary | ICD-10-CM | POA: Diagnosis not present

## 2019-06-29 DIAGNOSIS — M5416 Radiculopathy, lumbar region: Secondary | ICD-10-CM | POA: Diagnosis not present

## 2019-06-30 ENCOUNTER — Encounter: Payer: Self-pay | Admitting: Cardiology

## 2019-06-30 DIAGNOSIS — E118 Type 2 diabetes mellitus with unspecified complications: Secondary | ICD-10-CM | POA: Insufficient documentation

## 2019-06-30 DIAGNOSIS — Z7189 Other specified counseling: Secondary | ICD-10-CM | POA: Insufficient documentation

## 2019-06-30 NOTE — Progress Notes (Signed)
Cardiology Office Note   Date:  07/01/2019   ID:  Delane, Wessinger 75/23/1947, MRN [id number]  PCP:  Biagio Borg, MD  Cardiologist:   Minus Breeding, MD   Chief Complaint  Patient presents with  . Coronary Artery Disease      History of Present Illness: Brandon Rojas is a 74 y.o. male who presents for evaluation of CAD, s/p CABG in 2006.  He had problems with his gallbladder and was in the hospital last year.  He has had CVA with right lacunar infarct on MRI in August of last year.  He gets around with a cane.  He says he is feeling relatively well.  He denies any new cardiovascular symptoms.  He had no chest discomfort, neck or arm discomfort.  He has had no new shortness of breath, PND or orthopnea.  Said no palpitations, presyncope or syncope.    Past Medical History:  Diagnosis Date  . Anxiety state, unspecified   . Arthritis   . Benign neoplasm of colon   . CAD (coronary artery disease) of artery bypass graft 05/20/2018  . Depressive disorder, not elsewhere classified   . Diaphragmatic hernia without mention of obstruction or gangrene   . Diverticulosis of colon (without mention of hemorrhage)   . Esophageal reflux   . Esophageal stricture   . Hiatal hernia   . Mitral regurgitation   . Osteoarthrosis, unspecified whether generalized or localized, unspecified site   . Other and unspecified hyperlipidemia   . Parkinson's disease ([place])   . Personal history of colonic polyps   . Stroke ([place])   . Unspecified essential hypertension     Past Surgical History:  Procedure Laterality Date  . COLONOSCOPY    . CORONARY ARTERY BYPASS GRAFT     x 2  . INGUINAL HERNIA REPAIR     bilateral  . IR EXCHANGE BILIARY DRAIN  11/06/2018  . IR PERC CHOLECYSTOSTOMY  05/22/2018  . IR RADIOLOGIST EVAL & MGMT  06/24/2018  . IR RADIOLOGIST EVAL & MGMT  07/01/2018  . LUNG BIOPSY    . POLYPECTOMY    . PTCA       Current Outpatient Medications  Medication Sig Dispense Refill  .  aspirin 81 MG tablet Take 81 mg by mouth daily.      . B Complex Vitamins (VITAMIN B COMPLEX PO) Take 1 tablet by mouth as needed.     . carbidopa-levodopa (SINEMET IR) 25-100 MG tablet 1 tablet  7am/11am/3pm/7pm 360 tablet 1  . clopidogrel (PLAVIX) 75 MG tablet TAKE 1 TABLET EVERY DAY WITH BREAKFAST 90 tablet 1  . diazepam (VALIUM) 5 MG tablet 1 tab 45 min prior to procedure; may repeat 15 min prior 3 tablet 0  . esomeprazole (NEXIUM) 40 MG capsule TAKE 1 CAPSULE BY MOUTH ONCE DAILY 90 capsule 3  . lovastatin (MEVACOR) 20 MG tablet TAKE 1 TABLET AT BEDTIME 90 tablet 1  . Multiple Vitamin (MULTIVITAMIN WITH MINERALS) TABS tablet Take 1 tablet by mouth daily.    . nitroGLYCERIN (NITROSTAT) 0.4 MG SL tablet PLACE 1 TABLET (0.4 MG TOTAL) UNDER THE TONGUE EVERY 5 (FIVE) MINUTES AS NEEDED. 50 tablet 3  . Polyethyl Glycol-Propyl Glycol (SYSTANE ULTRA) 0.4-0.3 % SOLN Apply to eye as directed.    . solifenacin (VESICARE) 5 MG tablet Take 1 tablet (5 mg total) by mouth daily. 90 tablet 3  . tiZANidine (ZANAFLEX) 2 MG tablet TAKE 1 TABLET BY MOUTH EVERY 6 HOURS AS  NEEDED FOR MUSCLE SPASMS 40 tablet 1  . traMADol (ULTRAM) 50 MG tablet TAKE 1 TABLET BY MOUTH EVERY 6 HOURS AS NEEDED FOR PAIN 120 tablet 5   No current facility-administered medications for this visit.    Allergies:   Atorvastatin and Simvastatin    ROS:  Please see the history of present illness.   Otherwise, review of systems are positive for none.   All other systems are reviewed and negative.    PHYSICAL EXAM: VS:  BP (!) 172/87   Pulse (!) 56   Temp (!) 95 F (35 C)   Ht 5\' 4"  (1.626 m)   Wt 147 lb (66.7 kg)   SpO2 96%   BMI 25.23 kg/m  , BMI Body mass index is 25.23 kg/m. GEN:  No distress NECK:  No jugular venous distention at 90 degrees, waveform within normal limits, carotid upstroke brisk and symmetric, no bruits, no thyromegaly LUNGS:  Clear to auscultation bilaterally CHEST:  Well healed sternotomy scar. HEART:  S1  and S2 within normal limits, no S3, no S4, no clicks, no rubs, no murmurs ABD:  Positive bowel sounds normal in frequency in pitch, no bruits, no rebound, no guarding, unable to assess midline mass or bruit with the patient seated. EXT:  2 plus pulses throughout, moderate edema, no cyanosis no clubbing SKIN:  No rashes no nodules NEURO:  Cranial nerves II through XII grossly intact, motor grossly intact throughout PSYCH:  Cognitively intact, oriented to person place and time   EKG:  EKG is ordered today. The ekg ordered today demonstrates sinus rhythm, rate 56, right axis deviation, no acute ST-T wave changes.   Recent Labs: 03/09/2019: ALT 4; BUN 15; Creatinine, Ser 0.99; Hemoglobin 14.6; Platelets 235.0; Potassium 4.2; Sodium 140; TSH 2.17    Lipid Panel    Component Value Date/Time   CHOL 128 03/09/2019 1438   TRIG 101.0 03/09/2019 1438   HDL 52.50 03/09/2019 1438   CHOLHDL 2 03/09/2019 1438   VLDL 20.2 03/09/2019 1438   LDLCALC 56 03/09/2019 1438      Wt Readings from Last 3 Encounters:  07/01/19 147 lb (66.7 kg)  05/28/19 145 lb 3.2 oz (65.9 kg)  04/28/19 143 lb (64.9 kg)      Other studies Reviewed: Additional studies/ records that were reviewed today include: Labs. Review of the above records demonstrates:  Please see elsewhere in the note.     ASSESSMENT AND PLAN:   CAD: History of CABG in 2006, LIMA to LAD, SVG to PLA):    He has had no new symptoms related to this.  No change in therapy.  Chronic lower extremity edema: He has mild dependent edema.  No change in therapy.  Diabetes: A1c was 5.5.  No change in therapy.   Covid education: He has not had an interest in getting the vaccine.  We had a long discussion about this and education was provided.   Current medicines are reviewed at length with the patient today.  The patient does not have concerns regarding medicines.  The following changes have been made:  no change  Labs/ tests ordered today  include: None  Orders Placed This Encounter  Procedures  . EKG 12-Lead     Disposition:   FU with me in one year.     Signed, Minus Breeding, MD  07/01/2019 12:52 PM    [facility]

## 2019-07-01 ENCOUNTER — Other Ambulatory Visit: Payer: Self-pay

## 2019-07-01 ENCOUNTER — Ambulatory Visit: Payer: Medicare HMO | Admitting: Cardiology

## 2019-07-01 ENCOUNTER — Encounter: Payer: Self-pay | Admitting: Cardiology

## 2019-07-01 VITALS — BP 172/87 | HR 56 | Temp 95.0°F | Ht 64.0 in | Wt 147.0 lb

## 2019-07-01 DIAGNOSIS — E119 Type 2 diabetes mellitus without complications: Secondary | ICD-10-CM | POA: Diagnosis not present

## 2019-07-01 DIAGNOSIS — E118 Type 2 diabetes mellitus with unspecified complications: Secondary | ICD-10-CM

## 2019-07-01 DIAGNOSIS — I2510 Atherosclerotic heart disease of native coronary artery without angina pectoris: Secondary | ICD-10-CM | POA: Diagnosis not present

## 2019-07-01 DIAGNOSIS — Z7189 Other specified counseling: Secondary | ICD-10-CM | POA: Diagnosis not present

## 2019-07-01 MED ORDER — NITROGLYCERIN 0.4 MG SL SUBL: SUBLINGUAL_TABLET | SUBLINGUAL | 3 refills | Status: AC

## 2019-07-01 NOTE — Patient Instructions (Signed)
Medication Instructions:  Your physician recommends that you continue on your current medications as directed. Please refer to the Current Medication list given to you today.  *If you need a refill on your cardiac medications before your next appointment, please call your pharmacy*  Lab Work: NONE  Testing/Procedures: NONE  Follow-Up: At [facility], you and your health needs are our priority.  As part of our continuing mission to provide you with exceptional heart care, we have created designated Provider Care Teams.  These Care Teams include your primary Cardiologist (physician) and Advanced Practice Providers (APPs -  Physician Assistants and Nurse Practitioners) who all work together to provide you with the care you need, when you need it.  We recommend signing up for the patient portal called "MyChart".  Sign up information is provided on this After Visit Summary.  MyChart is used to connect with patients for Virtual Visits (Telemedicine).  Patients are able to view lab/test results, encounter notes, upcoming appointments, etc.  Non-urgent messages can be sent to your provider as well.   To learn more about what you can do with MyChart, go to [email or web address].    Your next appointment:   12 month(s)  You will receive a reminder letter in the mail two months in advance. If you don't receive a letter, please call our office to schedule the follow-up appointment.  The format for your next appointment:   In Person  Provider:   You may see Minus Breeding, MD or one of the following Advanced Practice Providers on your designated Care Team:    Rosaria Ferries, PA-C  Jory Sims, DNP, ANP  Cadence Kathlen Mody, NP

## 2019-08-25 NOTE — Progress Notes (Signed)
Assessment/Plan:   1.  [name]  -increase carbidopa/levodopa 25/100, 2 at 7am, 1 at 11am, 2 at 3pm, 1 at 7pm  2.  Low back pain with lumbar radiculopathy             -Has followed with orthopedics in the past.  Told did not need surgery.  3.  History of TIA in August, 2020 and April, 2021             -Patient was on aspirin and Plavix at the time of most recent infarct             -Patient has followed up with Dr. Leonie Man             -Continue lovastatin. Subjective:   Brandon Rojas was seen today in follow up for [name].  My previous records were reviewed prior to todays visit as well as outside records available to me. This patient is accompanied in the office by his spouse who supplements the history.Pt denies falls.  Pt denies lightheadedness, near syncope.  No hallucinations.  Mood has been good.  He has seen Dr. Leonie Man since last visit.  No changes were made in his aspirin or Plavix.  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  He saw Dr. Percival Spanish in June.  He had cataract surgery in June.  Has been done with PT for about 6-8 weeks  Current prescribed movement disorder medications: Carbidopa/levodopa 25/100, 1 tablet 4 times per day    ALLERGIES:   Allergies  Allergen Reactions  . Atorvastatin Other (See Comments)    Muscle cramps  . Simvastatin Other (See Comments)    Muscle cramps    CURRENT MEDICATIONS:  Outpatient Encounter Medications as of 08/28/2019  Medication Sig  . aspirin 81 MG tablet Take 81 mg by mouth daily.    . B Complex Vitamins (VITAMIN B COMPLEX PO) Take 1 tablet by mouth as needed.   . carbidopa-levodopa (SINEMET IR) 25-100 MG tablet 1 tablet  7am/11am/3pm/7pm  . clopidogrel (PLAVIX) 75 MG tablet TAKE 1 TABLET EVERY DAY WITH BREAKFAST  . esomeprazole (NEXIUM) 40 MG capsule TAKE 1 CAPSULE BY MOUTH ONCE DAILY  . lovastatin (MEVACOR) 20 MG tablet TAKE 1 TABLET AT BEDTIME  . Multiple Vitamin  (MULTIVITAMIN WITH MINERALS) TABS tablet Take 1 tablet by mouth daily.  . nitroGLYCERIN (NITROSTAT) 0.4 MG SL tablet PLACE 1 TABLET (0.4 MG TOTAL) UNDER THE TONGUE EVERY 5 (FIVE) MINUTES AS NEEDED.  [name] Polyethyl Glycol-Propyl Glycol (SYSTANE ULTRA) 0.4-0.3 % SOLN Apply to eye as directed.  . solifenacin (VESICARE) 5 MG tablet Take 1 tablet (5 mg total) by mouth daily.  [name] tiZANidine (ZANAFLEX) 2 MG tablet TAKE 1 TABLET BY MOUTH EVERY 6 HOURS AS NEEDED FOR MUSCLE SPASMS  . traMADol (ULTRAM) 50 MG tablet TAKE 1 TABLET BY MOUTH EVERY 6 HOURS AS NEEDED FOR PAIN  . [DISCONTINUED] diazepam (VALIUM) 5 MG tablet 1 tab 45 min prior to procedure; may repeat 15 min prior (Patient not taking: Reported on 08/28/2019)   No facility-administered encounter medications on file as of 08/28/2019.    Objective:   PHYSICAL EXAMINATION:    VITALS:   Vitals:   08/28/19 0923  BP: (!) 166/81  Pulse: 69  SpO2: 96%  Weight: 147 lb (66.7 kg)  Height: 5\' 4"  (1.626 m)    GEN:  The patient appears stated age and is in NAD. HEENT:  Normocephalic, atraumatic.  The mucous  membranes are moist. The superficial temporal arteries are without ropiness or tenderness. CV:  RRR Lungs:  CTAB Neck/HEME:  There are no carotid bruits bilaterally.  Neurological examination:  Orientation: The patient is alert and oriented x3. Cranial nerves: There is good facial symmetry with facial hypomimia. The speech is fluent and clear. Soft palate rises symmetrically and there is no tongue deviation. Hearing is intact to conversational tone. Sensation: Sensation is intact to light touch throughout Motor: Strength is at least antigravity x4.  Movement examination: Tone: There is mild to mod increased tone in the RUE and mild in the LUE Abnormal movements: none Coordination:  There is mod decremation with RAM's, with any form of RAMS, including alternating supination and pronation of the forearm, hand opening and closing, finger taps, heel  taps and toe taps, R>L Gait and Station: The patient has mild difficulty arising out of a deep-seated chair without the use of the hands. 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    I have reviewed and interpreted the following labs independently    Chemistry      Component Value Date/Time   NA 140 03/09/2019 1438   K 4.2 03/09/2019 1438   CL 105 03/09/2019 1438   CO2 28 03/09/2019 1438   BUN 15 03/09/2019 1438   CREATININE 0.99 03/09/2019 1438      Component Value Date/Time   CALCIUM 10.0 03/09/2019 1438   ALKPHOS 70 03/09/2019 1438   AST 16 03/09/2019 1438   ALT 4 03/09/2019 1438   BILITOT 0.5 03/09/2019 1438       Lab Results  Component Value Date   WBC 7.1 03/09/2019   HGB 14.6 03/09/2019   HCT 43.3 03/09/2019   MCV 90.4 03/09/2019   PLT 235.0 03/09/2019    Lab Results  Component Value Date   TSH 2.17 03/09/2019     Total time spent on today's visit was 40 minutes, including both face-to-face time and nonface-to-face time.  Time included that spent on review of records (prior notes available to me/labs/imaging if pertinent), discussing treatment and goals, answering patient's questions and coordinating care.  Cc:  Biagio Borg, MD

## 2019-08-27 ENCOUNTER — Other Ambulatory Visit: Payer: Self-pay

## 2019-08-28 ENCOUNTER — Encounter: Payer: Self-pay | Admitting: Neurology

## 2019-08-28 ENCOUNTER — Ambulatory Visit: Payer: Medicare HMO | Admitting: Neurology

## 2019-08-28 ENCOUNTER — Other Ambulatory Visit: Payer: Self-pay

## 2019-08-28 VITALS — BP 147/71 | HR 69 | Ht 64.0 in | Wt 147.0 lb

## 2019-08-28 DIAGNOSIS — G20 Parkinson's disease: Secondary | ICD-10-CM

## 2019-08-28 MED ORDER — CARBIDOPA-LEVODOPA 25-100 MG PO TABS
ORAL_TABLET | ORAL | 1 refills | Status: DC
Start: 2019-08-28 — End: 2020-04-01

## 2019-08-28 NOTE — Patient Instructions (Signed)
Take carbidopa/levodopa 25/100, 2 at 7am, 1 at 11am, 2 at 3pm, 1 at 7pm  The physicians and staff at [facility] are committed to providing excellent care. You may receive a survey requesting feedback about your experience at our office. We strive to receive "very good" responses to the survey questions. If you feel that your experience would prevent you from giving the office a "very good " response, please contact our office to try to remedy the situation. We may be reached at [phone]. Thank you for taking the time out of your busy day to complete the survey.

## 2019-09-10 ENCOUNTER — Ambulatory Visit (INDEPENDENT_AMBULATORY_CARE_PROVIDER_SITE_OTHER): Payer: Medicare HMO

## 2019-09-10 ENCOUNTER — Other Ambulatory Visit: Payer: Self-pay

## 2019-09-10 ENCOUNTER — Encounter: Payer: Self-pay | Admitting: Internal Medicine

## 2019-09-10 ENCOUNTER — Ambulatory Visit (INDEPENDENT_AMBULATORY_CARE_PROVIDER_SITE_OTHER): Payer: Medicare HMO | Admitting: Internal Medicine

## 2019-09-10 VITALS — BP 120/60 | HR 56 | Temp 98.4°F | Resp 16 | Ht 64.0 in | Wt 146.8 lb

## 2019-09-10 VITALS — BP 120/60 | HR 63 | Temp 98.4°F | Ht 64.0 in | Wt 147.0 lb

## 2019-09-10 DIAGNOSIS — E785 Hyperlipidemia, unspecified: Secondary | ICD-10-CM

## 2019-09-10 DIAGNOSIS — Z0000 Encounter for general adult medical examination without abnormal findings: Secondary | ICD-10-CM

## 2019-09-10 DIAGNOSIS — Z23 Encounter for immunization: Secondary | ICD-10-CM | POA: Diagnosis not present

## 2019-09-10 DIAGNOSIS — I10 Essential (primary) hypertension: Secondary | ICD-10-CM | POA: Diagnosis not present

## 2019-09-10 DIAGNOSIS — E118 Type 2 diabetes mellitus with unspecified complications: Secondary | ICD-10-CM | POA: Diagnosis not present

## 2019-09-10 MED ORDER — TIZANIDINE HCL 4 MG PO TABS: 4.0000 mg | ORAL_TABLET | Freq: Four times a day (QID) | ORAL | 1 refills | Status: DC | PRN

## 2019-09-10 NOTE — Progress Notes (Signed)
Subjective:   Brandon Rojas is a 74 y.o. male who presents for Medicare Annual/Subsequent preventive examination.  Review of Systems    No ROS.Medicare Wellness Visit. Cardiac Risk Factors include: advanced age (>57men, >12 women);diabetes mellitus;family history of premature cardiovascular disease;hypertension;male gender     Objective:    Today's Vitals   09/10/19 1317  BP: 120/60  Pulse: (!) 56  Resp: 16  Temp: 98.4 F (36.9 C)  Weight: 146 lb 12.8 oz (66.6 kg)  Height: 5\' 4"  (1.626 m)  PainSc: 6   PainLoc: Back   Body mass index is 25.2 kg/m.  Advanced Directives 09/10/2019 08/28/2019 04/28/2019 04/09/2019 10/15/2018 08/27/2018 08/26/2018  Does Patient Have a Medical Advance Directive? Yes Yes Yes Yes Yes - Yes  Type of Advance Directive Living will;Healthcare Power of [place];Living will [place];Living will [place];Living will Living will;Healthcare Power of [place]  Does patient want to make changes to medical advance directive? No - Patient declined - - - - No - Patient declined -  Copy of [place] in Chart? No - copy requested - - - - - Yes - validated most recent copy scanned in chart (See row information)  Would patient like information on creating a medical advance directive? - - - - - No - Patient declined -    Current Medications (verified) Outpatient Encounter Medications as of 09/10/2019  Medication Sig  . aspirin 81 MG tablet Take 81 mg by mouth daily.    . B Complex Vitamins (VITAMIN B COMPLEX PO) Take 1 tablet by mouth as needed.   . carbidopa-levodopa (SINEMET IR) 25-100 MG tablet 2 at 7am, 1 at 11am, 2 at 3pm, 1 at 7pm  . clopidogrel (PLAVIX) 75 MG tablet TAKE 1 TABLET EVERY DAY WITH BREAKFAST  . DUREZOL 0.05 % EMUL   . esomeprazole (NEXIUM) 40 MG capsule TAKE 1 CAPSULE BY MOUTH ONCE DAILY  . lovastatin (MEVACOR) 20 MG tablet TAKE 1  TABLET AT BEDTIME  . Multiple Vitamin (MULTIVITAMIN WITH MINERALS) TABS tablet Take 1 tablet by mouth daily.  . nitroGLYCERIN (NITROSTAT) 0.4 MG SL tablet PLACE 1 TABLET (0.4 MG TOTAL) UNDER THE TONGUE EVERY 5 (FIVE) MINUTES AS NEEDED.  [name] Polyethyl Glycol-Propyl Glycol (SYSTANE ULTRA) 0.4-0.3 % SOLN Apply to eye as directed.  [name] PROLENSA 0.07 % SOLN Place 1 drop into the left eye at bedtime.  . solifenacin (VESICARE) 5 MG tablet Take 1 tablet (5 mg total) by mouth daily.  [name] tiZANidine (ZANAFLEX) 2 MG tablet TAKE 1 TABLET BY MOUTH EVERY 6 HOURS AS NEEDED FOR MUSCLE SPASMS  . traMADol (ULTRAM) 50 MG tablet TAKE 1 TABLET BY MOUTH EVERY 6 HOURS AS NEEDED FOR PAIN   No facility-administered encounter medications on file as of 09/10/2019.    Allergies (verified) Atorvastatin and Simvastatin   History: Past Medical History:  Diagnosis Date  . Anxiety state, unspecified   . Arthritis   . Benign neoplasm of colon   . CAD (coronary artery disease) of artery bypass graft 05/20/2018  . Depressive disorder, not elsewhere classified   . Diaphragmatic hernia without mention of obstruction or gangrene   . Diverticulosis of colon (without mention of hemorrhage)   . Esophageal reflux   . Esophageal stricture   . Hiatal hernia   . Mitral regurgitation   . Osteoarthrosis, unspecified whether generalized or localized, unspecified site   . Other and unspecified hyperlipidemia   .  Parkinson's disease ([place])   . Personal history of colonic polyps   . Stroke ([place])   . Unspecified essential hypertension    Past Surgical History:  Procedure Laterality Date  . COLONOSCOPY    . CORONARY ARTERY BYPASS GRAFT     x 2  . INGUINAL HERNIA REPAIR     bilateral  . IR EXCHANGE BILIARY DRAIN  11/06/2018  . IR PERC CHOLECYSTOSTOMY  05/22/2018  . IR RADIOLOGIST EVAL & MGMT  06/24/2018  . IR RADIOLOGIST EVAL & MGMT  07/01/2018  . LUNG BIOPSY    . POLYPECTOMY    . PTCA     Family History  Problem Relation Age of  Onset  . Heart attack Mother   . Stroke Father   . Colon cancer Sister   . Healthy Sister   . Heart disease Brother   . Healthy Brother   . Healthy Brother   . Healthy Brother   . Healthy Daughter   . Fibromyalgia Daughter   . Multiple sclerosis Son    Social History   Socioeconomic History  . Marital status: Married    Spouse name: Not on file  . Number of children: 3  . Years of education: Not on file  . Highest education level: Doctorate  Occupational History  . Occupation: Mining engineer: [employer]  Tobacco Use  . Smoking status: Former Smoker    Quit date: 02/19/1972    Years since quitting: 47.5  . Smokeless tobacco: Never Used  Vaping Use  . Vaping Use: Never used  Substance and Sexual Activity  . Alcohol use: No    Alcohol/week: 0.0 standard drinks  . Drug use: No  . Sexual activity: Not on file  Other Topics Concern  . Not on file  Social History Narrative  . Not on file   Social Determinants of Health   Financial Resource Strain: Low Risk   . Difficulty of Paying Living Expenses: Not hard at all  Food Insecurity: No Food Insecurity  . Worried About Charity fundraiser in the Last Year: Never true  . Ran Out of Food in the Last Year: Never true  Transportation Needs: No Transportation Needs  . Lack of Transportation (Medical): No  . Lack of Transportation (Non-Medical): No  Physical Activity: Inactive  . Days of Exercise per Week: 0 days  . Minutes of Exercise per Session: 0 min  Stress: No Stress Concern Present  . Feeling of Stress : Not at all  Social Connections: Socially Integrated  . Frequency of Communication with Friends and Family: More than three times a week  . Frequency of Social Gatherings with Friends and Family: More than three times a week  . Attends Religious Services: More than 4 times per year  . Active Member of Clubs or Organizations: Yes  . Attends Archivist Meetings: More than 4 times per year    . Marital Status: Married    Tobacco Counseling Counseling given: Not Answered   Clinical Intake:  Pre-visit preparation completed: Yes  Pain : 0-10 Pain Score: 6  Pain Type: Chronic pain Pain Location: Back Pain Orientation: Lower Pain Descriptors / Indicators: Constant, Discomfort, Throbbing Pain Onset: More than a month ago Pain Frequency: Constant Pain Relieving Factors: muscle relaxers and Tramadol  Pain Relieving Factors: muscle relaxers and Tramadol  BMI - recorded: 25.2 Nutritional Status: BMI 25 -29 Overweight Nutritional Risks: None Diabetes: Yes CBG done?: No Did pt. bring in CBG  monitor from home?: No  How often do you need to have someone help you when you read instructions, pamphlets, or other written materials from your doctor or pharmacy?: 1 - Never What is the last grade level you completed in school?: Doctor of Theology  Diabetic? yes  Interpreter Needed?: No  Information entered by :: Nolen Lindamood N. Corda Shutt, LPN   Activities of Daily Living In your present state of health, do you have any difficulty performing the following activities: 09/10/2019  Hearing? N  Vision? N  Difficulty concentrating or making decisions? N  Walking or climbing stairs? Y  Dressing or bathing? N  Doing errands, shopping? Y  Preparing Food and eating ? N  Using the Toilet? N  In the past six months, have you accidently leaked urine? N  Do you have problems with loss of bowel control? N  Managing your Medications? N  Managing your Finances? N  Housekeeping or managing your Housekeeping? Y  Some recent data might be hidden    Patient Care Team: Biagio Borg, MD as PCP - Cheral Bay, MD as PCP - Cardiology (Cardiology)  Indicate any recent Medical Services you may have received from other than [facility] providers in the past year (date may be approximate).     Assessment:   This is a routine wellness examination for [place].  Hearing/Vision screen No exam  data present  Dietary issues and exercise activities discussed: Current Exercise Habits: Home exercise routine, Type of exercise: stretching (Does physical therapy exercises), Time (Minutes): 30, Frequency (Times/Week): 3, Weekly Exercise (Minutes/Week): 90, Intensity: Mild, Exercise limited by: neurologic condition(s);cardiac condition(s);orthopedic condition(s)  Goals   None    Depression Screen PHQ 2/9 Scores 09/10/2019 09/10/2019 03/09/2019 05/06/2017 01/19/2016 01/25/2015 01/15/2014  PHQ - 2 Score 1 1 1  0 0 0 0  PHQ- 9 Score - 1 - - - - -    Fall Risk Fall Risk  09/10/2019 09/10/2019 08/28/2019 04/28/2019 04/09/2019  Falls in the past year? 0 0 0 0 0  Number falls in past yr: - 0 0 0 0  Injury with Fall? - 0 0 0 0  Risk for fall due to : - Impaired balance/gait - - -  Follow up - Falls evaluation completed - - -    Any stairs in or around the home? Yes  If so, are there any without handrails? No  Home free of loose throw rugs in walkways, pet beds, electrical cords, etc? Yes  Adequate lighting in your home to reduce risk of falls? Yes   ASSISTIVE DEVICES UTILIZED TO PREVENT FALLS:  Life alert? No  Use of a cane, walker or w/c? Yes  Grab bars in the bathroom? Yes  Shower chair or bench in shower? Yes  Elevated toilet seat or a handicapped toilet? Yes   TIMED UP AND GO:  Was the test performed? No .  Length of time to ambulate 10 feet: 0 sec.   Gait slow and steady with assistive device  Cognitive Function: not indicated; patient is cogitatively intact.        Immunizations Immunization History  Administered Date(s) Administered  . Fluad Quad(high Dose 65+) 09/04/2018  . Influenza, High Dose Seasonal PF 12/09/2012, 01/19/2016, 11/12/2017  . Influenza,inj,Quad PF,6+ Mos 01/15/2014, 10/21/2014  . Pneumococcal Conjugate-13 12/23/2012  . Pneumococcal Polysaccharide-23 12/08/2009  . Td 01/04/2009    TDAP status: Due, Education has been provided regarding the importance of this  vaccine. Advised may receive this vaccine at local pharmacy or  Health Dept. Aware to provide a copy of the vaccination record if obtained from local pharmacy or Health Dept. Verbalized acceptance and understanding. Flu Vaccine status: Up to date Pneumococcal vaccine status: Up to date Covid-19 vaccine status: Declined, Education has been provided regarding the importance of this vaccine but patient still declined. Advised may receive this vaccine at local pharmacy or Health Dept.or vaccine clinic. Aware to provide a copy of the vaccination record if obtained from local pharmacy or Health Dept. Verbalized acceptance and understanding.  Qualifies for Shingles Vaccine? Yes   Zostavax completed No   Shingrix Completed?: No.    Education has been provided regarding the importance of this vaccine. Patient has been advised to call insurance company to determine out of pocket expense if they have not yet received this vaccine. Advised may also receive vaccine at local pharmacy or Health Dept. Verbalized acceptance and understanding.  Screening Tests Health Maintenance  Topic Date Due  . COVID-19 Vaccine (1) 09/26/2019 (Originally 05/15/1957)  . URINE MICROALBUMIN  10/10/2019 (Originally 05/16/1955)  . COLONOSCOPY  03/08/2020 (Originally 02/07/2017)  . TETANUS/TDAP  03/08/2020 (Originally 01/05/2019)  . INFLUENZA VACCINE  04/07/2020 (Originally 08/09/2019)  . HEMOGLOBIN A1C  11/28/2019  . OPHTHALMOLOGY EXAM  03/08/2020  . FOOT EXAM  09/09/2020  . Hepatitis C Screening  Completed  . PNA vac Low Risk Adult  Completed    Health Maintenance  There are no preventive care reminders to display for this patient.  Colorectal cancer screening: No longer required.   Lung Cancer Screening: (Low Dose CT Chest recommended if Age 40-80 years, 30 pack-year currently smoking OR have quit w/in 15years.) does not qualify.   Lung Cancer Screening Referral: no  Additional Screening:  Hepatitis C Screening: does  qualify; Completed yes  Vision Screening: Recommended annual ophthalmology exams for early detection of glaucoma and other disorders of the eye. Is the patient up to date with their annual eye exam?  Yes  Who is the provider or what is the name of the office in which the patient attends annual eye exams? Darleen Crocker, MD If pt is not established with a provider, would they like to be referred to a provider to establish care? No .   Dental Screening: Recommended annual dental exams for proper oral hygiene  Community Resource Referral / Chronic Care Management: CRR required this visit?  No   CCM required this visit?  No      Plan:     I have personally reviewed and noted the following in the patient's chart:   . Medical and social history . Use of alcohol, tobacco or illicit drugs  . Current medications and supplements . Functional ability and status . Nutritional status . Physical activity . Advanced directives . List of other physicians . Hospitalizations, surgeries, and ER visits in previous 12 months . Vitals . Screenings to include cognitive, depression, and falls . Referrals and appointments  In addition, I have reviewed and discussed with patient certain preventive protocols, quality metrics, and best practice recommendations. A written personalized care plan for preventive services as well as general preventive health recommendations were provided to patient.     Sheral Flow, LPN   02/12/971   Nurse Notes: n/a

## 2019-09-10 NOTE — Assessment & Plan Note (Signed)
stable overall by history and exam, recent data reviewed with pt, and pt to continue medical treatment as before,  to f/u any worsening symptoms or concerns  

## 2019-09-10 NOTE — Progress Notes (Addendum)
Subjective:    Patient ID: Brandon Rojas, male    DOB: Aug 24, 1945, 74 y.o.   MRN: [id number]  HPI  Here to f/u; overall doing ok,  Pt denies chest pain, increasing sob or doe, wheezing, orthopnea, PND, increased LE swelling, palpitations, dizziness or syncope.  Pt denies new neurological symptoms such as new headache, or facial or extremity weakness or numbness.  Pt denies polydipsia, polyuria, or low sugar episode.  Pt states overall good compliance with meds, mostly trying to follow appropriate diet, with wt overall stable,  but little exercise however. No new complaints  Pt continues to have recurring LBP without change in severity, bowel or bladder change, fever, wt loss,  worsening LE pain/numbness/weakness, gait change or falls, asks for increased tizanidine prn Past Medical History:  Diagnosis Date  . Anxiety state, unspecified   . Arthritis   . Benign neoplasm of colon   . CAD (coronary artery disease) of artery bypass graft 05/20/2018  . Depressive disorder, not elsewhere classified   . Diaphragmatic hernia without mention of obstruction or gangrene   . Diverticulosis of colon (without mention of hemorrhage)   . Esophageal reflux   . Esophageal stricture   . Hiatal hernia   . Mitral regurgitation   . Osteoarthrosis, unspecified whether generalized or localized, unspecified site   . Other and unspecified hyperlipidemia   . Parkinson's disease ([place])   . Personal history of colonic polyps   . Stroke ([place])   . Unspecified essential hypertension    Past Surgical History:  Procedure Laterality Date  . COLONOSCOPY    . CORONARY ARTERY BYPASS GRAFT     x 2  . INGUINAL HERNIA REPAIR     bilateral  . IR EXCHANGE BILIARY DRAIN  11/06/2018  . IR PERC CHOLECYSTOSTOMY  05/22/2018  . IR RADIOLOGIST EVAL & MGMT  06/24/2018  . IR RADIOLOGIST EVAL & MGMT  07/01/2018  . LUNG BIOPSY    . POLYPECTOMY    . PTCA      reports that he quit smoking about 47 years ago. He has never used smokeless  tobacco. He reports that he does not drink alcohol and does not use drugs. family history includes Colon cancer in his sister; Fibromyalgia in his daughter; Healthy in his brother, brother, brother, daughter, and sister; Heart attack in his mother; Heart disease in his brother; Multiple sclerosis in his son; Stroke in his father. Allergies  Allergen Reactions  . Atorvastatin Other (See Comments)    Muscle cramps  . Simvastatin Other (See Comments)    Muscle cramps   Current Outpatient Medications on File Prior to Visit  Medication Sig Dispense Refill  . aspirin 81 MG tablet Take 81 mg by mouth daily.      . B Complex Vitamins (VITAMIN B COMPLEX PO) Take 1 tablet by mouth as needed.     . carbidopa-levodopa (SINEMET IR) 25-100 MG tablet 2 at 7am, 1 at 11am, 2 at 3pm, 1 at 7pm 540 tablet 1  . clopidogrel (PLAVIX) 75 MG tablet TAKE 1 TABLET EVERY DAY WITH BREAKFAST 90 tablet 1  . DUREZOL 0.05 % EMUL     . esomeprazole (NEXIUM) 40 MG capsule TAKE 1 CAPSULE BY MOUTH ONCE DAILY 90 capsule 3  . lovastatin (MEVACOR) 20 MG tablet TAKE 1 TABLET AT BEDTIME 90 tablet 1  . Multiple Vitamin (MULTIVITAMIN WITH MINERALS) TABS tablet Take 1 tablet by mouth daily.    . nitroGLYCERIN (NITROSTAT) 0.4 MG SL tablet PLACE 1  TABLET (0.4 MG TOTAL) UNDER THE TONGUE EVERY 5 (FIVE) MINUTES AS NEEDED. 50 tablet 3  . Polyethyl Glycol-Propyl Glycol (SYSTANE ULTRA) 0.4-0.3 % SOLN Apply to eye as directed.    [name] PROLENSA 0.07 % SOLN Place 1 drop into the left eye at bedtime.    . solifenacin (VESICARE) 5 MG tablet Take 1 tablet (5 mg total) by mouth daily. 90 tablet 3  . traMADol (ULTRAM) 50 MG tablet TAKE 1 TABLET BY MOUTH EVERY 6 HOURS AS NEEDED FOR PAIN 120 tablet 5   No current facility-administered medications on file prior to visit.   Review of Systems All otherwise neg per pt    Objective:   Physical Exam BP 120/60 (BP Location: Left Arm, Patient Position: Sitting, Cuff Size: Large)   Pulse 63   Temp 98.4 F  (36.9 C) (Oral)   Ht 5\' 4"  (1.626 m)   Wt 147 lb (66.7 kg)   SpO2 97%   BMI 25.23 kg/m  VS noted,  Constitutional: Pt appears in NAD HENT: Head: NCAT.  Right Ear: External ear normal.  Left Ear: External ear normal.  Eyes: . Pupils are equal, round, and reactive to light. Conjunctivae and EOM are normal Nose: without d/c or deformity Neck: Neck supple. Gross normal ROM Cardiovascular: Normal rate and regular rhythm.   Pulmonary/Chest: Effort normal and breath sounds without rales or wheezing.  Abd:  Soft, NT, ND, + BS, no organomegaly Neurological: Pt is alert. At baseline orientation, motor grossly intact Skin: Skin is warm. No rashes, other new lesions, no LE edema Psychiatric: Pt behavior is normal without agitation  All otherwise neg per pt Lab Results  Component Value Date   WBC 7.1 03/09/2019   HGB 14.6 03/09/2019   HCT 43.3 03/09/2019   PLT 235.0 03/09/2019   GLUCOSE 102 (H) 03/09/2019   CHOL 128 03/09/2019   TRIG 101.0 03/09/2019   HDL 52.50 03/09/2019   LDLCALC 56 03/09/2019   ALT 4 03/09/2019   AST 16 03/09/2019   NA 140 03/09/2019   K 4.2 03/09/2019   CL 105 03/09/2019   CREATININE 0.99 03/09/2019   BUN 15 03/09/2019   CO2 28 03/09/2019   TSH 2.17 03/09/2019   PSA 1.23 09/04/2018   INR 0.9 08/26/2018   HGBA1C 5.5 05/28/2019      Assessment & Plan:

## 2019-09-10 NOTE — Patient Instructions (Signed)
You had the Tdap tetanus shot today  Ok to try the higher dose tizanidine but watch for dizziness or weakness  Please continue all other medications as before, and refills have been done if requested.  Please have the pharmacy call with any other refills you may need.  Please continue your efforts at being more active, low cholesterol diet, and weight control.  Please keep your appointments with your specialists as you may have planned  Please make an Appointment to return in 6 months, or sooner if needed

## 2019-09-10 NOTE — Patient Instructions (Addendum)
Mr. Kulpa , Thank you for taking time to come for your Medicare Wellness Visit. I appreciate your ongoing commitment to your health goals. Please review the following plan we discussed and let me know if I can assist you in the future.   Screening recommendations/referrals: Colonoscopy: no repeat due to age Recommended yearly ophthalmology/optometry visit for glaucoma screening and checkup Recommended yearly dental visit for hygiene and checkup  Vaccinations: Influenza vaccine: 09/04/2018 Pneumococcal vaccine: completed Tdap vaccine: overdue Shingles vaccine: never done   Covid-19: never done  Advanced directives: Please bring a copy of your health care power of attorney and living will to the office at your convenience.  Conditions/risks identified: Yes; Reviewed health maintenance screenings with patient today and relevant education, vaccines, and/or referrals were provided. Please continue to do your personal lifestyle choices by: daily care of teeth and gums, regular physical activity (goal should be 5 days a week for 30 minutes), eat a healthy diet, avoid tobacco and drug use, limiting any alcohol intake, taking a low-dose aspirin (if not allergic or have been advised by your provider otherwise) and taking vitamins and minerals as recommended by your provider. Continue doing brain stimulating activities (puzzles, reading, adult coloring books, staying active) to keep memory sharp. Continue to eat heart healthy diet (full of fruits, vegetables, whole grains, lean protein, water--limit salt, fat, and sugar intake) and increase physical activity as tolerated.  Next appointment: Please schedule your next Medicare Wellness Visit with your Nurse Health Advisor in 1 year.  Preventive Care 39 Years and Older, Male Preventive care refers to lifestyle choices and visits with your health care provider that can promote health and wellness. What does preventive care include?  A yearly physical exam.  This is also called an annual well check.  Dental exams once or twice a year.  Routine eye exams. Ask your health care provider how often you should have your eyes checked.  Personal lifestyle choices, including:  Daily care of your teeth and gums.  Regular physical activity.  Eating a healthy diet.  Avoiding tobacco and drug use.  Limiting alcohol use.  Practicing safe sex.  Taking low doses of aspirin every day.  Taking vitamin and mineral supplements as recommended by your health care provider. What happens during an annual well check? The services and screenings done by your health care provider during your annual well check will depend on your age, overall health, lifestyle risk factors, and family history of disease. Counseling  Your health care provider may ask you questions about your:  Alcohol use.  Tobacco use.  Drug use.  Emotional well-being.  Home and relationship well-being.  Sexual activity.  Eating habits.  History of falls.  Memory and ability to understand (cognition).  Work and work Statistician. Screening  You may have the following tests or measurements:  Height, weight, and BMI.  Blood pressure.  Lipid and cholesterol levels. These may be checked every 5 years, or more frequently if you are over 42 years old.  Skin check.  Lung cancer screening. You may have this screening every year starting at age 45 if you have a 30-pack-year history of smoking and currently smoke or have quit within the past 15 years.  Fecal occult blood test (FOBT) of the stool. You may have this test every year starting at age 75.  Flexible sigmoidoscopy or colonoscopy. You may have a sigmoidoscopy every 5 years or a colonoscopy every 10 years starting at age 54.  Prostate cancer screening. Recommendations will  vary depending on your family history and other risks.  Hepatitis C blood test.  Hepatitis B blood test.  Sexually transmitted disease (STD)  testing.  Diabetes screening. This is done by checking your blood sugar (glucose) after you have not eaten for a while (fasting). You may have this done every 1-3 years.  Abdominal aortic aneurysm (AAA) screening. You may need this if you are a current or former smoker.  Osteoporosis. You may be screened starting at age 31 if you are at high risk. Talk with your health care provider about your test results, treatment options, and if necessary, the need for more tests. Vaccines  Your health care provider may recommend certain vaccines, such as:  Influenza vaccine. This is recommended every year.  Tetanus, diphtheria, and acellular pertussis (Tdap, Td) vaccine. You may need a Td booster every 10 years.  Zoster vaccine. You may need this after age 14.  Pneumococcal 13-valent conjugate (PCV13) vaccine. One dose is recommended after age 59.  Pneumococcal polysaccharide (PPSV23) vaccine. One dose is recommended after age 26. Talk to your health care provider about which screenings and vaccines you need and how often you need them. This information is not intended to replace advice given to you by your health care provider. Make sure you discuss any questions you have with your health care provider. Document Released: 01/21/2015 Document Revised: 09/14/2015 Document Reviewed: 10/26/2014 Elsevier Interactive Patient Education  2017 [place] Prevention in the Home Falls can cause injuries. They can happen to people of all ages. There are many things you can do to make your home safe and to help prevent falls. What can I do on the outside of my home?  Regularly fix the edges of walkways and driveways and fix any cracks.  Remove anything that might make you trip as you walk through a door, such as a raised step or threshold.  Trim any bushes or trees on the path to your home.  Use bright outdoor lighting.  Clear any walking paths of anything that might make someone trip, such as  rocks or tools.  Regularly check to see if handrails are loose or broken. Make sure that both sides of any steps have handrails.  Any raised decks and porches should have guardrails on the edges.  Have any leaves, snow, or ice cleared regularly.  Use sand or salt on walking paths during winter.  Clean up any spills in your garage right away. This includes oil or grease spills. What can I do in the bathroom?  Use night lights.  Install grab bars by the toilet and in the tub and shower. Do not use towel bars as grab bars.  Use non-skid mats or decals in the tub or shower.  If you need to sit down in the shower, use a plastic, non-slip stool.  Keep the floor dry. Clean up any water that spills on the floor as soon as it happens.  Remove soap buildup in the tub or shower regularly.  Attach bath mats securely with double-sided non-slip rug tape.  Do not have throw rugs and other things on the floor that can make you trip. What can I do in the bedroom?  Use night lights.  Make sure that you have a light by your bed that is easy to reach.  Do not use any sheets or blankets that are too big for your bed. They should not hang down onto the floor.  Have a firm chair that has  side arms. You can use this for support while you get dressed.  Do not have throw rugs and other things on the floor that can make you trip. What can I do in the kitchen?  Clean up any spills right away.  Avoid walking on wet floors.  Keep items that you use a lot in easy-to-reach places.  If you need to reach something above you, use a strong step stool that has a grab bar.  Keep electrical cords out of the way.  Do not use floor polish or wax that makes floors slippery. If you must use wax, use non-skid floor wax.  Do not have throw rugs and other things on the floor that can make you trip. What can I do with my stairs?  Do not leave any items on the stairs.  Make sure that there are handrails on  both sides of the stairs and use them. Fix handrails that are broken or loose. Make sure that handrails are as long as the stairways.  Check any carpeting to make sure that it is firmly attached to the stairs. Fix any carpet that is loose or worn.  Avoid having throw rugs at the top or bottom of the stairs. If you do have throw rugs, attach them to the floor with carpet tape.  Make sure that you have a light switch at the top of the stairs and the bottom of the stairs. If you do not have them, ask someone to add them for you. What else can I do to help prevent falls?  Wear shoes that:  Do not have high heels.  Have rubber bottoms.  Are comfortable and fit you well.  Are closed at the toe. Do not wear sandals.  If you use a stepladder:  Make sure that it is fully opened. Do not climb a closed stepladder.  Make sure that both sides of the stepladder are locked into place.  Ask someone to hold it for you, if possible.  Clearly mark and make sure that you can see:  Any grab bars or handrails.  First and last steps.  Where the edge of each step is.  Use tools that help you move around (mobility aids) if they are needed. These include:  Canes.  Walkers.  Scooters.  Crutches.  Turn on the lights when you go into a dark area. Replace any light bulbs as soon as they burn out.  Set up your furniture so you have a clear path. Avoid moving your furniture around.  If any of your floors are uneven, fix them.  If there are any pets around you, be aware of where they are.  Review your medicines with your doctor. Some medicines can make you feel dizzy. This can increase your chance of falling. Ask your doctor what other things that you can do to help prevent falls. This information is not intended to replace advice given to you by your health care provider. Make sure you discuss any questions you have with your health care provider. Document Released: 10/21/2008 Document  Revised: 06/02/2015 Document Reviewed: 01/29/2014 Elsevier Interactive Patient Education  2017 [employer].

## 2019-09-10 NOTE — Assessment & Plan Note (Addendum)
stable overall by history and exam, recent data reviewed with pt, and pt to continue medical treatment as before,  to f/u any worsening symptoms or concerns  I spent 31 minutes in preparing to see the patient by review of recent labs, imaging and procedures, obtaining and reviewing separately obtained history, communicating with the patient and family or caregiver, ordering medications, tests or procedures, and documenting clinical information in the EHR including the differential Dx, treatment, and any further evaluation and other management of dm, htn, hld  

## 2019-09-11 ENCOUNTER — Other Ambulatory Visit: Payer: Self-pay | Admitting: Internal Medicine

## 2019-09-11 NOTE — Telephone Encounter (Signed)
Done erx 

## 2019-09-11 NOTE — Telephone Encounter (Signed)
Sent to Dr. John. 

## 2019-09-23 ENCOUNTER — Other Ambulatory Visit: Payer: Self-pay | Admitting: Internal Medicine

## 2019-09-23 NOTE — Telephone Encounter (Signed)
Please refill as per office routine med refill policy (all routine meds refilled for 3 mo or monthly per pt preference up to one year from last visit, then month to month grace period for 3 mo, then further med refills will have to be denied)  

## 2019-10-13 ENCOUNTER — Encounter: Payer: Self-pay | Admitting: Internal Medicine

## 2019-10-13 DIAGNOSIS — R0600 Dyspnea, unspecified: Secondary | ICD-10-CM

## 2019-10-15 NOTE — Telephone Encounter (Signed)
I did referral  O/w would need ROV or UC visit

## 2019-11-13 ENCOUNTER — Institutional Professional Consult (permissible substitution): Payer: Medicare HMO | Admitting: Pulmonary Disease

## 2019-11-23 DIAGNOSIS — H43392 Other vitreous opacities, left eye: Secondary | ICD-10-CM | POA: Diagnosis not present

## 2019-11-23 DIAGNOSIS — H26493 Other secondary cataract, bilateral: Secondary | ICD-10-CM | POA: Diagnosis not present

## 2019-11-23 DIAGNOSIS — H01009 Unspecified blepharitis unspecified eye, unspecified eyelid: Secondary | ICD-10-CM | POA: Diagnosis not present

## 2019-11-23 DIAGNOSIS — Z961 Presence of intraocular lens: Secondary | ICD-10-CM | POA: Diagnosis not present

## 2019-11-23 DIAGNOSIS — H59033 Cystoid macular edema following cataract surgery, bilateral: Secondary | ICD-10-CM | POA: Diagnosis not present

## 2019-12-07 DIAGNOSIS — H01009 Unspecified blepharitis unspecified eye, unspecified eyelid: Secondary | ICD-10-CM | POA: Diagnosis not present

## 2019-12-07 DIAGNOSIS — H43392 Other vitreous opacities, left eye: Secondary | ICD-10-CM | POA: Diagnosis not present

## 2019-12-07 DIAGNOSIS — H26493 Other secondary cataract, bilateral: Secondary | ICD-10-CM | POA: Diagnosis not present

## 2019-12-07 DIAGNOSIS — Z961 Presence of intraocular lens: Secondary | ICD-10-CM | POA: Diagnosis not present

## 2019-12-07 DIAGNOSIS — H59033 Cystoid macular edema following cataract surgery, bilateral: Secondary | ICD-10-CM | POA: Diagnosis not present

## 2020-01-06 DIAGNOSIS — Z20828 Contact with and (suspected) exposure to other viral communicable diseases: Secondary | ICD-10-CM | POA: Diagnosis not present

## 2020-01-06 DIAGNOSIS — R079 Chest pain, unspecified: Secondary | ICD-10-CM | POA: Diagnosis not present

## 2020-01-06 DIAGNOSIS — I429 Cardiomyopathy, unspecified: Secondary | ICD-10-CM | POA: Diagnosis not present

## 2020-01-06 DIAGNOSIS — K449 Diaphragmatic hernia without obstruction or gangrene: Secondary | ICD-10-CM | POA: Diagnosis not present

## 2020-01-15 ENCOUNTER — Ambulatory Visit (INDEPENDENT_AMBULATORY_CARE_PROVIDER_SITE_OTHER): Payer: Medicare HMO

## 2020-01-15 ENCOUNTER — Ambulatory Visit: Payer: Medicare HMO | Admitting: Emergency Medicine

## 2020-01-15 ENCOUNTER — Encounter: Payer: Self-pay | Admitting: Emergency Medicine

## 2020-01-15 ENCOUNTER — Other Ambulatory Visit: Payer: Self-pay

## 2020-01-15 VITALS — BP 122/70 | HR 61 | Temp 98.1°F | Ht 64.0 in | Wt 156.8 lb

## 2020-01-15 DIAGNOSIS — R0600 Dyspnea, unspecified: Secondary | ICD-10-CM

## 2020-01-15 DIAGNOSIS — K449 Diaphragmatic hernia without obstruction or gangrene: Secondary | ICD-10-CM | POA: Diagnosis not present

## 2020-01-15 NOTE — Assessment & Plan Note (Signed)
Interesting presentation as his episodes of tachypnea and shortness of breath are not always associated with exertion.  His most bothersome episode was on 12/29 when he was sitting still and became tachypneic.  Question a component of restrictive lung disease given his hiatal hernia, kyphosis.  Question some degree of anxiety.  He has also heard wheeze and has gotten some perceived clinical benefit from albuterol so there may be a component of obstructive lung disease here.  His tobacco history is minimal so possibly some fixed asthma.  I think would be reasonable to perform PFT and try to determine whether there is obstructive lung disease.  If so then he may benefit from longer acting bronchodilators.  For now we will continue albuterol as needed.  If his PFT are unrevealing then may want to review the case with Dr. Percival Spanish.  He had a good cardiology evaluation in June but he does also have bilateral lower extremity edema.  He denies any chest pain.  I will check a chest x-ray to look for any evidence of pulmonary edema.

## 2020-01-15 NOTE — Progress Notes (Signed)
Subjective:    Patient ID: Brandon Rojas, male    DOB: 02-13-1945, 75 y.o.   MRN: [id number]  HPI 75 year old gentleman, former smoker (5-8 pack years) with a history of CAD/CABG (2006), mitral regurgitation, GERD with hiatal hernia, CVA, Parkinson's disease.  He is referred today for evaluation of shortness of breath, tachypnea. No hx of asthma.   He has had some exertional SOB with fast walking, noticed about 1-2 months ago. He heard some wheeze associated with the dyspnea depending on level activity. His carbidopa was increased in August. He started Balance of Petra Kuba about 3 months ago. No other med changes. Reassuring cardiac eval by Dr Percival Spanish in June.  On 12/29 he had a more severe episode while sitting at rest - was tachypneic just at rest, no clear trigger, was associated with wheeze. He coughs occasionally but not really associated with these episodes of dyspnea. He was given albuterol, seems to get some benefit from it - less SOB, less wheeze. CXR was done at urgent care > they report was clear  Has occasional GERD sx.   Review of Systems As per HPI  Past Medical History:  Diagnosis Date  . Anxiety state, unspecified   . Arthritis   . Benign neoplasm of colon   . CAD (coronary artery disease) of artery bypass graft 05/20/2018  . Depressive disorder, not elsewhere classified   . Diaphragmatic hernia without mention of obstruction or gangrene   . Diverticulosis of colon (without mention of hemorrhage)   . Esophageal reflux   . Esophageal stricture   . Hiatal hernia   . Mitral regurgitation   . Osteoarthrosis, unspecified whether generalized or localized, unspecified site   . Other and unspecified hyperlipidemia   . Parkinson's disease ([place])   . Personal history of colonic polyps   . Stroke ([place])   . Unspecified essential hypertension      Family History  Problem Relation Age of Onset  . Heart attack Mother   . Stroke Father   . Colon cancer Sister   . Healthy Sister    . Heart disease Brother   . Healthy Brother   . Healthy Brother   . Healthy Brother   . Healthy Daughter   . Fibromyalgia Daughter   . Multiple sclerosis Son      Social History   Socioeconomic History  . Marital status: Married    Spouse name: Not on file  . Number of children: 3  . Years of education: Not on file  . Highest education level: Doctorate  Occupational History  . Occupation: Mining engineer: [employer]  Tobacco Use  . Smoking status: Former Smoker    Quit date: 02/19/1972    Years since quitting: 47.9  . Smokeless tobacco: Never Used  Vaping Use  . Vaping Use: Never used  Substance and Sexual Activity  . Alcohol use: No    Alcohol/week: 0.0 standard drinks  . Drug use: No  . Sexual activity: Not on file  Other Topics Concern  . Not on file  Social History Narrative  . Not on file   Social Determinants of Health   Financial Resource Strain: Low Risk   . Difficulty of Paying Living Expenses: Not hard at all  Food Insecurity: No Food Insecurity  . Worried About Charity fundraiser in the Last Year: Never true  . Ran Out of Food in the Last Year: Never true  Transportation Needs: No Transportation Needs  .  Lack of Transportation (Medical): No  . Lack of Transportation (Non-Medical): No  Physical Activity: Inactive  . Days of Exercise per Week: 0 days  . Minutes of Exercise per Session: 0 min  Stress: No Stress Concern Present  . Feeling of Stress : Not at all  Social Connections: Socially Integrated  . Frequency of Communication with Friends and Family: More than three times a week  . Frequency of Social Gatherings with Friends and Family: More than three times a week  . Attends Religious Services: More than 4 times per year  . Active Member of Clubs or Organizations: Yes  . Attends Archivist Meetings: More than 4 times per year  . Marital Status: Married  Human resources officer Violence: Not At Risk  . Fear of Current or  Ex-Partner: No  . Emotionally Abused: No  . Physically Abused: No  . Sexually Abused: No     Allergies  Allergen Reactions  . Atorvastatin Other (See Comments)    Muscle cramps  . Simvastatin Other (See Comments)    Muscle cramps     Outpatient Medications Prior to Visit  Medication Sig Dispense Refill  . albuterol (VENTOLIN HFA) 108 (90 Base) MCG/ACT inhaler Inhale 1-2 puffs into the lungs every 6 (six) hours as needed for wheezing or shortness of breath.    [name] aspirin 81 MG tablet Take 81 mg by mouth daily.    . B Complex Vitamins (VITAMIN B COMPLEX PO) Take 1 tablet by mouth as needed.    . carbidopa-levodopa (SINEMET IR) 25-100 MG tablet 2 at 7am, 1 at 11am, 2 at 3pm, 1 at 7pm 540 tablet 1  . clopidogrel (PLAVIX) 75 MG tablet TAKE 1 TABLET EVERY DAY WITH BREAKFAST 90 tablet 1  . DUREZOL 0.05 % EMUL     . esomeprazole (NEXIUM) 40 MG capsule TAKE 1 CAPSULE BY MOUTH ONCE DAILY 90 capsule 3  . lovastatin (MEVACOR) 20 MG tablet TAKE 1 TABLET AT BEDTIME 90 tablet 1  . Multiple Vitamin (MULTIVITAMIN WITH MINERALS) TABS tablet Take 1 tablet by mouth daily.    . nitroGLYCERIN (NITROSTAT) 0.4 MG SL tablet PLACE 1 TABLET (0.4 MG TOTAL) UNDER THE TONGUE EVERY 5 (FIVE) MINUTES AS NEEDED. 50 tablet 3  . Polyethyl Glycol-Propyl Glycol (SYSTANE ULTRA) 0.4-0.3 % SOLN Apply to eye as directed.    [name] PROLENSA 0.07 % SOLN Place 1 drop into the left eye at bedtime.    . solifenacin (VESICARE) 5 MG tablet Take 1 tablet (5 mg total) by mouth daily. 90 tablet 3  . tiZANidine (ZANAFLEX) 4 MG tablet Take 1 tablet (4 mg total) by mouth every 6 (six) hours as needed for muscle spasms. 120 tablet 1  . traMADol (ULTRAM) 50 MG tablet TAKE 1 TABLET BY MOUTH EVERY 6 HOURS AS NEEDED FOR PAIN 120 tablet 5   No facility-administered medications prior to visit.         Objective:   Physical Exam  Vitals:   01/15/20 1359  BP: 122/70  Pulse: 61  Temp: 98.1 F (36.7 C)  TempSrc: Temporal  SpO2: 99%   Weight: 156 lb 12.8 oz (71.1 kg)  Height: 5\' 4"  (1.626 m)   Gen: Pleasant, elderly man, in no distress, a bit slow to respond but appropriate.  Significant kyphosis  ENT: No lesions,  mouth clear,  oropharynx clear, no postnasal drip  Neck: No JVD, no stridor  Lungs: No use of accessory muscles, few bibasilar inspiratory crackles that clear with a deep  inspiration, no wheeze  Cardiovascular: Distant, regular, 1+ pitting pretibial edema  Musculoskeletal: No deformities, no cyanosis or clubbing  Neuro: alert, awake, no appreciable tremor.  He is slow to respond but responds appropriately.  Moves all extremities with good strength  Skin: Warm, no lesions or rash      Assessment & Plan:  Dyspnea Interesting presentation as his episodes of tachypnea and shortness of breath are not always associated with exertion.  His most bothersome episode was on 12/29 when he was sitting still and became tachypneic.  Question a component of restrictive lung disease given his hiatal hernia, kyphosis.  Question some degree of anxiety.  He has also heard wheeze and has gotten some perceived clinical benefit from albuterol so there may be a component of obstructive lung disease here.  His tobacco history is minimal so possibly some fixed asthma.  I think would be reasonable to perform PFT and try to determine whether there is obstructive lung disease.  If so then he may benefit from longer acting bronchodilators.  For now we will continue albuterol as needed.  If his PFT are unrevealing then may want to review the case with Dr. Percival Spanish.  He had a good cardiology evaluation in June but he does also have bilateral lower extremity edema.  He denies any chest pain.  I will check a chest x-ray to look for any evidence of pulmonary edema.  Baltazar Apo, MD, PhD 01/15/2020, 2:28 PM [facility] [phone] or if no answer [phone]

## 2020-01-15 NOTE — Patient Instructions (Signed)
We will perform a repeat chest x-ray today We will arrange for pulmonary function testing at your next office visit Please keep your albuterol available to use 2 puffs when needed for shortness of breath, chest tightness, wheezing. Depending on your pulmonary function testing results we will decide whether to start an additional inhaler medication. Depending on how your breathing progresses and your test results we will decide whether we need to review your status with Dr. Percival Spanish. Follow with Dr. Lamonte Sakai next available with full pulmonary function testing on the same day.

## 2020-01-25 ENCOUNTER — Institutional Professional Consult (permissible substitution): Payer: Medicare HMO | Admitting: Pulmonary Disease

## 2020-01-26 MED ORDER — DOXYCYCLINE HYCLATE 100 MG PO TABS: 100.0000 mg | ORAL_TABLET | Freq: Two times a day (BID) | ORAL | 0 refills | Status: DC

## 2020-01-26 NOTE — Telephone Encounter (Signed)
Dr. Lamonte Sakai, please see mychart messages sent by pt/family and advise.

## 2020-01-26 NOTE — Telephone Encounter (Signed)
Difficult to say whether this reflects a bronchitis or not - but I am ok with giving him doxycycline 100mg  bid x 7 days to see if he gets benefit.   Also, with regard to his breathing pattern at night, possible apneas, it sounds like he might benefit from a sleep study if he is willing to do this.

## 2020-01-28 NOTE — Telephone Encounter (Signed)
Thank you :)

## 2020-01-28 NOTE — Telephone Encounter (Signed)
Patient sent email regarding sleep study  Would not be able to do study of this sort, up and down at night using restroom, would not be a true reading.  Sending to [place] as FYI

## 2020-02-01 ENCOUNTER — Encounter: Payer: Self-pay | Admitting: Internal Medicine

## 2020-02-01 MED ORDER — KETOCONAZOLE 2 % EX CREA: 1.0000 "application " | TOPICAL_CREAM | Freq: Every day | CUTANEOUS | 1 refills | Status: DC

## 2020-02-08 DIAGNOSIS — J3489 Other specified disorders of nose and nasal sinuses: Secondary | ICD-10-CM | POA: Diagnosis not present

## 2020-02-08 DIAGNOSIS — Z20828 Contact with and (suspected) exposure to other viral communicable diseases: Secondary | ICD-10-CM | POA: Diagnosis not present

## 2020-02-08 DIAGNOSIS — Z1152 Encounter for screening for COVID-19: Secondary | ICD-10-CM | POA: Diagnosis not present

## 2020-02-11 ENCOUNTER — Ambulatory Visit: Payer: Medicare HMO | Admitting: Neurology

## 2020-02-11 ENCOUNTER — Ambulatory Visit (INDEPENDENT_AMBULATORY_CARE_PROVIDER_SITE_OTHER): Payer: Medicare HMO | Admitting: Emergency Medicine

## 2020-02-11 ENCOUNTER — Telehealth: Payer: Self-pay | Admitting: Emergency Medicine

## 2020-02-11 ENCOUNTER — Other Ambulatory Visit: Payer: Self-pay

## 2020-02-11 ENCOUNTER — Encounter: Payer: Self-pay | Admitting: Emergency Medicine

## 2020-02-11 ENCOUNTER — Ambulatory Visit: Payer: Medicare HMO | Admitting: Emergency Medicine

## 2020-02-11 VITALS — BP 114/74 | HR 56 | Temp 97.4°F | Ht <= 58 in | Wt 160.0 lb

## 2020-02-11 DIAGNOSIS — R0600 Dyspnea, unspecified: Secondary | ICD-10-CM

## 2020-02-11 DIAGNOSIS — J984 Other disorders of lung: Secondary | ICD-10-CM

## 2020-02-11 DIAGNOSIS — R0602 Shortness of breath: Secondary | ICD-10-CM

## 2020-02-11 LAB — PULMONARY FUNCTION TEST
DL/VA % pred: 125 %
DL/VA: 5.09 ml/min/mmHg/L
DLCO cor % pred: 81 %
DLCO cor: 16.97 ml/min/mmHg
DLCO unc % pred: 81 %
DLCO unc: 16.97 ml/min/mmHg
FEF 25-75 Post: 1.81 L/sec
FEF 25-75 Pre: 1.83 L/sec
FEF2575-%Change-Post: 0 %
FEF2575-%Pred-Post: 107 %
FEF2575-%Pred-Pre: 108 %
FEV1-%Change-Post: 0 %
FEV1-%Pred-Post: 65 %
FEV1-%Pred-Pre: 65 %
FEV1-Post: 1.54 L
FEV1-Pre: 1.53 L
FEV1FVC-%Change-Post: 5 %
FEV1FVC-%Pred-Pre: 113 %
FEV6-%Change-Post: -5 %
FEV6-%Pred-Post: 58 %
FEV6-%Pred-Pre: 61 %
FEV6-Post: 1.77 L
FEV6-Pre: 1.86 L
FEV6FVC-%Pred-Post: 107 %
FEV6FVC-%Pred-Pre: 107 %
FVC-%Change-Post: -5 %
FVC-%Pred-Post: 54 %
FVC-%Pred-Pre: 56 %
FVC-Post: 1.77 L
FVC-Pre: 1.86 L
Post FEV1/FVC ratio: 87 %
Post FEV6/FVC ratio: 100 %
Pre FEV1/FVC ratio: 82 %
Pre FEV6/FVC Ratio: 100 %
RV % pred: 69 %
RV: 1.54 L
TLC % pred: 59 %
TLC: 3.47 L

## 2020-02-11 MED ORDER — ALBUTEROL SULFATE HFA 108 (90 BASE) MCG/ACT IN AERS
1.0000 | INHALATION_SPRAY | Freq: Four times a day (QID) | RESPIRATORY_TRACT | 4 refills | Status: DC | PRN
Start: 2020-02-11 — End: 2020-02-11

## 2020-02-11 MED ORDER — ALBUTEROL SULFATE HFA 108 (90 BASE) MCG/ACT IN AERS: 1.0000 | INHALATION_SPRAY | Freq: Four times a day (QID) | RESPIRATORY_TRACT | 4 refills | Status: DC | PRN

## 2020-02-11 NOTE — Telephone Encounter (Signed)
Called and spoke with patient's wife Kermit Balo to confirm which inhaler she was referring to. She expressed it was his rescue inhaler which is the only one on his list. That has been sent into preferred pharmacy. Nothing further needed at this time.

## 2020-02-11 NOTE — Addendum Note (Signed)
Addended by: Dierdre Highman on: 02/11/2020 04:39 PM   Modules accepted: Orders

## 2020-02-11 NOTE — Assessment & Plan Note (Signed)
In absence of significant obstruction on his pulmonary function testing I suspect this is mostly restriction and deconditioning.  His previous cardiac evaluation last summer was reassuring.  I think he may benefit from pulmonary rehab and we will make a referral to [facility].  If he remains short of breath after that then we can revisit his status with Dr. Percival Spanish.  He may be benefiting some from albuterol even in absence of asthma on his PFT.  Okay for him to continue to have it available to use if needed.

## 2020-02-11 NOTE — Progress Notes (Signed)
Full PFT performed today. °

## 2020-02-11 NOTE — Addendum Note (Signed)
Addended by: Gavin Potters R on: 02/11/2020 02:25 PM   Modules accepted: Orders

## 2020-02-11 NOTE — Patient Instructions (Signed)
Your pulmonary function testing shows restriction on the size of each breath you can take which is increasing your work of breathing and causing shortness of breath. There is evidence to support COPD or asthma. Okay to keep your albuterol available to use 2 puffs if you need it for shortness of breath since it has benefited you in the past. We will refer you to pulmonary rehab to see if this helps your stamina and breathing. Follow with Dr Lamonte Sakai in 4 months or sooner if you have any problems.

## 2020-02-11 NOTE — Progress Notes (Signed)
° °  Subjective:    Patient ID: Brandon Rojas, male    DOB: September 12, 1945, 75 y.o.   MRN: [id number]  HPI 75 year old gentleman, former smoker (5-8 pack years) with a history of CAD/CABG (2006), mitral regurgitation, GERD with hiatal hernia, CVA, Parkinson's disease.  He is referred today for evaluation of shortness of breath, tachypnea. No hx of asthma.   He has had some exertional SOB with fast walking, noticed about 1-2 months ago. He heard some wheeze associated with the dyspnea depending on level activity. His carbidopa was increased in August. He started Balance of Petra Kuba about 3 months ago. No other med changes. Reassuring cardiac eval by Dr Percival Spanish in June.  On 12/29 he had a more severe episode while sitting at rest - was tachypneic just at rest, no clear trigger, was associated with wheeze. He coughs occasionally but not really associated with these episodes of dyspnea. He was given albuterol, seems to get some benefit from it - less SOB, less wheeze. CXR was done at urgent care > they report was clear  Has occasional GERD sx.  ROV 02/11/20 --follow-up visit 75 year old gentleman with a minimal tobacco history, CAD/CABG, MR, GERD, Parkinson's, CVA.  I saw him in January for dyspnea, tachypnea of unclear cause.  He had a reassuring cardiac evaluation.  He underwent pulmonary function testing today which I have reviewed, shows evidence for restriction based on lung volumes and spirometry, possible coexisting obstruction, normal diffusion capacity. He reports that he has continued to have some intermittent episodes of dyspnea at rest. Reliably gets SOB with exertion - has to stop to rest after about 50 ft. Has albuterol and does believe that it helps him sometimes.  He does have reflux and associated cough.    Review of Systems As per HPI      Objective:   Physical Exam  Vitals:   02/11/20 1350  BP: 114/74  Pulse: (!) 56  Temp: (!) 97.4 F (36.3 C)  SpO2: 96%  Weight: 160 lb (72.6 kg)   Height: 4\' 10"  (1.473 m)   Gen: Pleasant, elderly man, in no distress, a bit slow to respond but appropriate.  Significant kyphosis  ENT: No lesions,  mouth clear,  oropharynx clear, no postnasal drip  Neck: No JVD, no stridor  Lungs: No use of accessory muscles, few bibasilar inspiratory crackles that clear with a deep inspiration, no wheeze  Cardiovascular: Distant, regular, 1+ pitting pretibial edema  Musculoskeletal: No deformities, no cyanosis or clubbing  Neuro: alert, awake, no appreciable tremor.  He is slow to respond but responds appropriately.  Moves all extremities with good strength  Skin: Warm, no lesions or rash      Assessment & Plan:  Dyspnea In absence of significant obstruction on his pulmonary function testing I suspect this is mostly restriction and deconditioning.  His previous cardiac evaluation last summer was reassuring.  I think he may benefit from pulmonary rehab and we will make a referral to [facility].  If he remains short of breath after that then we can revisit his status with Dr. Percival Spanish.  He may be benefiting some from albuterol even in absence of asthma on his PFT.  Okay for him to continue to have it available to use if needed.  Baltazar Apo, MD, PhD 02/11/2020, 2:17 PM  Pulmonary and Critical Care [phone] or if no answer [phone]

## 2020-02-12 ENCOUNTER — Other Ambulatory Visit: Payer: Self-pay | Admitting: *Deleted

## 2020-02-12 ENCOUNTER — Ambulatory Visit: Payer: Medicare HMO | Admitting: Sports Medicine

## 2020-02-12 ENCOUNTER — Other Ambulatory Visit: Payer: Self-pay

## 2020-02-12 ENCOUNTER — Encounter: Payer: Self-pay | Admitting: Sports Medicine

## 2020-02-12 ENCOUNTER — Ambulatory Visit (INDEPENDENT_AMBULATORY_CARE_PROVIDER_SITE_OTHER): Payer: Medicare HMO | Admitting: Sports Medicine

## 2020-02-12 DIAGNOSIS — I739 Peripheral vascular disease, unspecified: Secondary | ICD-10-CM

## 2020-02-12 DIAGNOSIS — E118 Type 2 diabetes mellitus with unspecified complications: Secondary | ICD-10-CM | POA: Diagnosis not present

## 2020-02-12 DIAGNOSIS — R21 Rash and other nonspecific skin eruption: Secondary | ICD-10-CM

## 2020-02-12 MED ORDER — DIPHENHYDRAMINE HCL 50 MG PO TABS: 50.0000 mg | ORAL_TABLET | Freq: Every evening | ORAL | 0 refills | Status: DC | PRN

## 2020-02-12 MED ORDER — NYSTATIN-TRIAMCINOLONE 100000-0.1 UNIT/GM-% EX OINT: 1.0000 "application " | TOPICAL_OINTMENT | Freq: Two times a day (BID) | CUTANEOUS | 0 refills | Status: DC

## 2020-02-12 NOTE — Progress Notes (Signed)
Subjective: Brandon Rojas is a 75 y.o. male patient seen in office for evaluation of ulceration of the rash that started 2 weeks ago after wearing a pair shoes that he thought rubbed the top of his feet. States that his legs itch at night. Denies nausea/fever/vomiting/chills/night sweats/shortness of breath. Patient has a history of parkinson as reported by wife. Patient has no other pedal complaints at this time.  Review of Systems  All other systems reviewed and are negative.   Patient Active Problem List   Diagnosis Date Noted  . Dyspnea 01/15/2020  . Type 2 diabetes mellitus with complication, without long-term current use of insulin ([place]) 06/30/2019  . Educated about COVID-19 virus infection 06/30/2019  . TIA (transient ischemic attack) 08/26/2018  . Nocturia 05/30/2018  . Elevated troponin 05/20/2018  . Epigastric pain 05/20/2018  . CAD (coronary artery disease) of artery bypass graft 05/20/2018  . Acute cholecystitis 05/20/2018  . Subacute left lumbar radiculopathy 06/23/2015  . Mitral regurgitation 09/17/2014  . Parkinson's disease ([place]) 08/19/2014  . Pulmonary hypertension ([place]) 03/30/2014  . Piriformis syndrome of left side 03/02/2014  . Peripheral edema 01/15/2014  . Movement disorder 01/15/2014  . Hypercalcemia 01/15/2014  . Left hip pain 01/15/2014  . Hoarseness 01/15/2014  . Back pain 06/08/2010  . Encounter for long-term (current) use of high-risk medication 06/08/2010  . ED (erectile dysfunction) 06/08/2010  . Preventative health care 06/07/2010  . SHOULDER PAIN, LEFT 08/30/2009  . CAROTID ARTERY DISEASE 01/25/2009  . BRADYCARDIA 10/08/2007  . COLONIC POLYPS 03/20/2007  . HIATAL HERNIA 03/20/2007  . DIVERTICULOSIS, COLON 03/20/2007  . Hyperlipidemia 09/19/2006  . OBESITY 09/19/2006  . Anxiety state 09/19/2006  . ERECTILE DYSFUNCTION 09/19/2006  . Depression 09/19/2006  . Essential hypertension 09/19/2006  . Coronary atherosclerosis 09/19/2006  . ALLERGIC  RHINITIS 09/19/2006  . GERD 09/19/2006  . OSTEOARTHRITIS 09/19/2006  . TRANSIENT ISCHEMIC ATTACK, HX OF 09/19/2006  . PSORIASIS, HX OF 09/19/2006   Current Outpatient Medications on File Prior to Visit  Medication Sig Dispense Refill  . albuterol (VENTOLIN HFA) 108 (90 Base) MCG/ACT inhaler Inhale 1-2 puffs into the lungs every 6 (six) hours as needed for wheezing or shortness of breath. 18 g 4  . aspirin 81 MG tablet Take 81 mg by mouth daily.    . B Complex Vitamins (VITAMIN B COMPLEX PO) Take 1 tablet by mouth as needed.    . carbidopa-levodopa (SINEMET IR) 25-100 MG tablet 2 at 7am, 1 at 11am, 2 at 3pm, 1 at 7pm 540 tablet 1  . clopidogrel (PLAVIX) 75 MG tablet TAKE 1 TABLET EVERY DAY WITH BREAKFAST 90 tablet 1  . doxycycline (VIBRA-TABS) 100 MG tablet Take 1 tablet (100 mg total) by mouth 2 (two) times daily. 14 tablet 0  . DUREZOL 0.05 % EMUL     . esomeprazole (NEXIUM) 40 MG capsule TAKE 1 CAPSULE BY MOUTH ONCE DAILY 90 capsule 3  . ketoconazole (NIZORAL) 2 % cream Apply 1 application topically daily. 30 g 1  . lovastatin (MEVACOR) 20 MG tablet TAKE 1 TABLET AT BEDTIME 90 tablet 1  . Multiple Vitamin (MULTIVITAMIN WITH MINERALS) TABS tablet Take 1 tablet by mouth daily.    . nitroGLYCERIN (NITROSTAT) 0.4 MG SL tablet PLACE 1 TABLET (0.4 MG TOTAL) UNDER THE TONGUE EVERY 5 (FIVE) MINUTES AS NEEDED. 50 tablet 3  . Polyethyl Glycol-Propyl Glycol (SYSTANE ULTRA) 0.4-0.3 % SOLN Apply to eye as directed.    [name] PROLENSA 0.07 % SOLN Place 1 drop into the  left eye at bedtime.    . solifenacin (VESICARE) 5 MG tablet Take 1 tablet (5 mg total) by mouth daily. 90 tablet 3  . tiZANidine (ZANAFLEX) 4 MG tablet Take 1 tablet (4 mg total) by mouth every 6 (six) hours as needed for muscle spasms. 120 tablet 1  . traMADol (ULTRAM) 50 MG tablet TAKE 1 TABLET BY MOUTH EVERY 6 HOURS AS NEEDED FOR PAIN 120 tablet 5   No current facility-administered medications on file prior to visit.   Allergies   Allergen Reactions  . Atorvastatin Other (See Comments)    Muscle cramps  . Simvastatin Other (See Comments)    Muscle cramps    Recent Results (from the past 2160 hour(s))  Pulmonary function test     Status: None (Preliminary result)   Collection Time: 02/11/20 11:56 AM  Result Value Ref Range   FVC-Pre 1.86 L   FVC-%Pred-Pre 56 %   FVC-Post 1.77 L   FVC-%Pred-Post 54 %   FVC-%Change-Post -5 %   FEV1-Pre 1.53 L   FEV1-%Pred-Pre 65 %   FEV1-Post 1.54 L   FEV1-%Pred-Post 65 %   FEV1-%Change-Post 0 %   FEV6-Pre 1.86 L   FEV6-%Pred-Pre 61 %   FEV6-Post 1.77 L   FEV6-%Pred-Post 58 %   FEV6-%Change-Post -5 %   Pre FEV1/FVC ratio 82 %   FEV1FVC-%Pred-Pre 113 %   Post FEV1/FVC ratio 87 %   FEV1FVC-%Change-Post 5 %   Pre FEV6/FVC Ratio 100 %   FEV6FVC-%Pred-Pre 107 %   Post FEV6/FVC ratio 100 %   FEV6FVC-%Pred-Post 107 %   FEF 25-75 Pre 1.83 L/sec   FEF2575-%Pred-Pre 108 %   FEF 25-75 Post 1.81 L/sec   FEF2575-%Pred-Post 107 %   FEF2575-%Change-Post 0 %   RV 1.54 L   RV % pred 69 %   TLC 3.47 L   TLC % pred 59 %   DLCO unc 16.97 ml/min/mmHg   DLCO unc % pred 81 %   DLCO cor 16.97 ml/min/mmHg   DLCO cor % pred 81 %   DL/VA 5.09 ml/min/mmHg/L   DL/VA % pred 125 %    Objective: There were no vitals filed for this visit.  General: Patient is awake, alert, oriented x 3 and in no acute distress. Wife helps to report history.  Dermatology: Skin is warm and dry bilateral with multiple red spots to both feet and legs, Subjective itchiness, There is no malodor, no active drainage, no erythema. No acute signs of infection.   Vascular: Dorsalis Pedis pulse = 0/4 Bilateral,  Posterior Tibial pulse = 0/4 Bilateral,  Capillary Fill Time < 5 seconds, +2 pitting edema bilateral.   Neurologic: Gross sensation present via light touch.   Musculosketal: There is no pain to palpation bilateral. + Pes planus.   No results for input(s): GRAMSTAIN, LABORGA in the last 8760  hours.  Assessment and Plan:  Problem List Items Addressed This Visit      Endocrine   Type 2 diabetes mellitus with complication, without long-term current use of insulin ([place])    Other Visit Diagnoses    Rash    -  Primary   PVD (peripheral vascular disease) ([place])           -Examined patient  -Discussed with patient treatment options for rash versus dermatitis secondary to PVD -Rx mycolog cream for possible rash -Rx Benadryl for itching to take at bedtime -Applied Surgigrip compression sleeves bilateral to assist with edema control -Encouraged elevation and to watch  diet to make sure that the swelling is not coming from consuming too much salt and sitting with legs down too long in dependency -Continue with good supportive shoes that do not rub for foot type -Patient to return to office in 2 to 3 weeks for follow up care and evaluation or sooner if problems arise.  Landis Martins, DPM

## 2020-02-15 ENCOUNTER — Encounter: Payer: Self-pay | Admitting: Internal Medicine

## 2020-02-25 NOTE — Progress Notes (Deleted)
Assessment/Plan:   1.  [name]  -***Continue carbidopa/levodopa 25/100, 2 tablets at 7 AM/1 tablet at 11 AM/2 tablets at 3 PM/1 tablet at 7 PM  2.  History of TIA in August, 2028 April, 2020  -Patient has followed up with Dr. Leonie Man  -Continue lovastatin  -On aspirin   Subjective:   Brandon Rojas was seen today in follow up for [name].  My previous records were reviewed prior to todays visit as well as outside records available to me. Pt denies falls.  Pt denies lightheadedness, near syncope.  No hallucinations.  Mood has been good.  He is following with pulmonology for restrictive lung disease and intermittent dyspnea and tachypnea, even at rest.  When he saw pulmonary in February, it was suggested that pulmonary rehab may be of value, but it was suspected that deconditioning was an issue.  Current prescribed movement disorder medications: ***carbidopa/levodopa 25/100, 2 tablets at 7 AM/1 tablet at 11 AM/2 tablets at 3 PM/1 tablet at 7 PM (started last visit)   PREVIOUS MEDICATIONS: {Parkinson's RX:18200}  ALLERGIES:   Allergies  Allergen Reactions  . Atorvastatin Other (See Comments)    Muscle cramps  . Simvastatin Other (See Comments)    Muscle cramps    CURRENT MEDICATIONS:  Outpatient Encounter Medications as of 03/01/2020  Medication Sig  . albuterol (VENTOLIN HFA) 108 (90 Base) MCG/ACT inhaler Inhale 1-2 puffs into the lungs every 6 (six) hours as needed for wheezing or shortness of breath.  [name] aspirin 81 MG tablet Take 81 mg by mouth daily.  . B Complex Vitamins (VITAMIN B COMPLEX PO) Take 1 tablet by mouth as needed.  . carbidopa-levodopa (SINEMET IR) 25-100 MG tablet 2 at 7am, 1 at 11am, 2 at 3pm, 1 at 7pm  . clopidogrel (PLAVIX) 75 MG tablet TAKE 1 TABLET EVERY DAY WITH BREAKFAST  . diclofenac (VOLTAREN) 0.1 % ophthalmic solution   . diphenhydrAMINE (BENADRYL) 50 MG tablet Take 1 tablet (50 mg total) by mouth at bedtime as needed for  itching.  [name] doxycycline (VIBRA-TABS) 100 MG tablet Take 1 tablet (100 mg total) by mouth 2 (two) times daily.  . DUREZOL 0.05 % EMUL   . esomeprazole (NEXIUM) 40 MG capsule TAKE 1 CAPSULE BY MOUTH ONCE DAILY  . ketoconazole (NIZORAL) 2 % cream Apply 1 application topically daily.  [name] lovastatin (MEVACOR) 20 MG tablet TAKE 1 TABLET AT BEDTIME  . Multiple Vitamin (MULTIVITAMIN WITH MINERALS) TABS tablet Take 1 tablet by mouth daily.  . nitroGLYCERIN (NITROSTAT) 0.4 MG SL tablet PLACE 1 TABLET (0.4 MG TOTAL) UNDER THE TONGUE EVERY 5 (FIVE) MINUTES AS NEEDED.  [name] nystatin-triamcinolone ointment (MYCOLOG) Apply 1 application topically 2 (two) times daily.  Vladimir Faster Glycol-Propyl Glycol (SYSTANE ULTRA) 0.4-0.3 % SOLN Apply to eye as directed.  [name] PROLENSA 0.07 % SOLN Place 1 drop into the left eye at bedtime.  . solifenacin (VESICARE) 5 MG tablet Take 1 tablet (5 mg total) by mouth daily.  [name] tiZANidine (ZANAFLEX) 4 MG tablet Take 1 tablet (4 mg total) by mouth every 6 (six) hours as needed for muscle spasms.  . traMADol (ULTRAM) 50 MG tablet TAKE 1 TABLET BY MOUTH EVERY 6 HOURS AS NEEDED FOR PAIN   No facility-administered encounter medications on file as of 03/01/2020.    Objective:   PHYSICAL EXAMINATION:    VITALS:  There were no vitals filed for this visit.  GEN:  The patient appears stated age and is in NAD. HEENT:  Normocephalic, atraumatic.  The mucous membranes are moist. The superficial temporal arteries are without ropiness or tenderness. CV:  RRR Lungs:  CTAB Neck/HEME:  There are no carotid bruits bilaterally.  Neurological examination:  Orientation: The patient is alert and oriented x3. Cranial nerves: There is good facial symmetry with*** facial hypomimia. The speech is fluent and clear. Soft palate rises symmetrically and there is no tongue deviation. Hearing is intact to conversational tone. Sensation: Sensation is intact to light touch throughout Motor: Strength is at  least antigravity x4.  Movement examination: Tone: There is ***tone in the *** Abnormal movements: *** Coordination:  There is *** decremation with RAM's, *** Gait and Station: The patient has *** difficulty arising out of a deep-seated chair without the use of the hands. The patient's stride length is ***.  The patient has a *** pull test.     I have reviewed and interpreted the following labs independently    Chemistry      Component Value Date/Time   NA 140 03/09/2019 1438   K 4.2 03/09/2019 1438   CL 105 03/09/2019 1438   CO2 28 03/09/2019 1438   BUN 15 03/09/2019 1438   CREATININE 0.99 03/09/2019 1438      Component Value Date/Time   CALCIUM 10.0 03/09/2019 1438   ALKPHOS 70 03/09/2019 1438   AST 16 03/09/2019 1438   ALT 4 03/09/2019 1438   BILITOT 0.5 03/09/2019 1438       Lab Results  Component Value Date   WBC 7.1 03/09/2019   HGB 14.6 03/09/2019   HCT 43.3 03/09/2019   MCV 90.4 03/09/2019   PLT 235.0 03/09/2019    Lab Results  Component Value Date   TSH 2.17 03/09/2019     Total time spent on today's visit was ***30 minutes, including both face-to-face time and nonface-to-face time.  Time included that spent on review of records (prior notes available to me/labs/imaging if pertinent), discussing treatment and goals, answering patient's questions and coordinating care.  Cc:  Biagio Borg, MD

## 2020-02-25 NOTE — Telephone Encounter (Signed)
Received the following message from patient's wife:   "The Ventolin HFA inhaler that you prescribed for my husband(Brandon Rojas - DOB 12/09/1945) is too strong, it makes him nervous after using this. I need for you to try another inhaler like Spireva Inhaler, maybe this will work better for him.  It will be sent to [facility] in [place], [place]."  Patient was last seen on 02/11/20 by Dr. Lamonte Sakai. He was advised at that visit to use the albuterol as needed for SOB.   Dr. Lamonte Sakai, can you please advise? Thanks!

## 2020-02-26 ENCOUNTER — Inpatient Hospital Stay (HOSPITAL_COMMUNITY): Payer: Medicare HMO

## 2020-02-26 ENCOUNTER — Inpatient Hospital Stay (HOSPITAL_COMMUNITY)
Admission: EM | Admit: 2020-02-26 | Discharge: 2020-03-03 | DRG: 291 | Disposition: A | Discharge status: Home Health | Payer: Medicare HMO | Attending: Internal Medicine | Admitting: Internal Medicine

## 2020-02-26 ENCOUNTER — Other Ambulatory Visit: Payer: Self-pay

## 2020-02-26 ENCOUNTER — Emergency Department (HOSPITAL_COMMUNITY): Payer: Medicare HMO

## 2020-02-26 ENCOUNTER — Encounter (HOSPITAL_COMMUNITY): Payer: Self-pay | Admitting: Emergency Medicine

## 2020-02-26 DIAGNOSIS — I4892 Unspecified atrial flutter: Secondary | ICD-10-CM | POA: Diagnosis present

## 2020-02-26 DIAGNOSIS — I10 Essential (primary) hypertension: Secondary | ICD-10-CM | POA: Diagnosis present

## 2020-02-26 DIAGNOSIS — R0602 Shortness of breath: Secondary | ICD-10-CM | POA: Diagnosis not present

## 2020-02-26 DIAGNOSIS — Z7982 Long term (current) use of aspirin: Secondary | ICD-10-CM | POA: Diagnosis not present

## 2020-02-26 DIAGNOSIS — K449 Diaphragmatic hernia without obstruction or gangrene: Secondary | ICD-10-CM | POA: Diagnosis not present

## 2020-02-26 DIAGNOSIS — M1990 Unspecified osteoarthritis, unspecified site: Secondary | ICD-10-CM | POA: Diagnosis present

## 2020-02-26 DIAGNOSIS — E785 Hyperlipidemia, unspecified: Secondary | ICD-10-CM | POA: Diagnosis present

## 2020-02-26 DIAGNOSIS — R443 Hallucinations, unspecified: Secondary | ICD-10-CM

## 2020-02-26 DIAGNOSIS — I6389 Other cerebral infarction: Secondary | ICD-10-CM | POA: Diagnosis not present

## 2020-02-26 DIAGNOSIS — I4891 Unspecified atrial fibrillation: Secondary | ICD-10-CM | POA: Diagnosis not present

## 2020-02-26 DIAGNOSIS — R600 Localized edema: Secondary | ICD-10-CM | POA: Diagnosis present

## 2020-02-26 DIAGNOSIS — F0280 Dementia in other diseases classified elsewhere without behavioral disturbance: Secondary | ICD-10-CM | POA: Diagnosis present

## 2020-02-26 DIAGNOSIS — G9341 Metabolic encephalopathy: Secondary | ICD-10-CM | POA: Diagnosis not present

## 2020-02-26 DIAGNOSIS — G20A1 Parkinson's disease without dyskinesia, without mention of fluctuations: Secondary | ICD-10-CM | POA: Diagnosis present

## 2020-02-26 DIAGNOSIS — Z8673 Personal history of transient ischemic attack (TIA), and cerebral infarction without residual deficits: Secondary | ICD-10-CM | POA: Diagnosis not present

## 2020-02-26 DIAGNOSIS — I083 Combined rheumatic disorders of mitral, aortic and tricuspid valves: Secondary | ICD-10-CM | POA: Diagnosis present

## 2020-02-26 DIAGNOSIS — I248 Other forms of acute ischemic heart disease: Secondary | ICD-10-CM | POA: Diagnosis present

## 2020-02-26 DIAGNOSIS — R0600 Dyspnea, unspecified: Secondary | ICD-10-CM | POA: Diagnosis present

## 2020-02-26 DIAGNOSIS — K5730 Diverticulosis of large intestine without perforation or abscess without bleeding: Secondary | ICD-10-CM | POA: Diagnosis present

## 2020-02-26 DIAGNOSIS — Z888 Allergy status to other drugs, medicaments and biological substances status: Secondary | ICD-10-CM

## 2020-02-26 DIAGNOSIS — I340 Nonrheumatic mitral (valve) insufficiency: Secondary | ICD-10-CM | POA: Diagnosis not present

## 2020-02-26 DIAGNOSIS — K08109 Complete loss of teeth, unspecified cause, unspecified class: Secondary | ICD-10-CM | POA: Diagnosis present

## 2020-02-26 DIAGNOSIS — I517 Cardiomegaly: Secondary | ICD-10-CM | POA: Diagnosis not present

## 2020-02-26 DIAGNOSIS — I5033 Acute on chronic diastolic (congestive) heart failure: Secondary | ICD-10-CM | POA: Diagnosis not present

## 2020-02-26 DIAGNOSIS — K219 Gastro-esophageal reflux disease without esophagitis: Secondary | ICD-10-CM | POA: Diagnosis present

## 2020-02-26 DIAGNOSIS — I110 Hypertensive heart disease with heart failure: Principal | ICD-10-CM | POA: Diagnosis present

## 2020-02-26 DIAGNOSIS — R7989 Other specified abnormal findings of blood chemistry: Secondary | ICD-10-CM | POA: Diagnosis present

## 2020-02-26 DIAGNOSIS — I25810 Atherosclerosis of coronary artery bypass graft(s) without angina pectoris: Secondary | ICD-10-CM | POA: Diagnosis not present

## 2020-02-26 DIAGNOSIS — Z7902 Long term (current) use of antithrombotics/antiplatelets: Secondary | ICD-10-CM

## 2020-02-26 DIAGNOSIS — I361 Nonrheumatic tricuspid (valve) insufficiency: Secondary | ICD-10-CM

## 2020-02-26 DIAGNOSIS — Z8249 Family history of ischemic heart disease and other diseases of the circulatory system: Secondary | ICD-10-CM

## 2020-02-26 DIAGNOSIS — I5031 Acute diastolic (congestive) heart failure: Secondary | ICD-10-CM

## 2020-02-26 DIAGNOSIS — E118 Type 2 diabetes mellitus with unspecified complications: Secondary | ICD-10-CM | POA: Diagnosis present

## 2020-02-26 DIAGNOSIS — Z79899 Other long term (current) drug therapy: Secondary | ICD-10-CM | POA: Diagnosis not present

## 2020-02-26 DIAGNOSIS — R778 Other specified abnormalities of plasma proteins: Secondary | ICD-10-CM | POA: Diagnosis not present

## 2020-02-26 DIAGNOSIS — R059 Cough, unspecified: Secondary | ICD-10-CM | POA: Diagnosis not present

## 2020-02-26 DIAGNOSIS — G20 Parkinson's disease: Secondary | ICD-10-CM | POA: Diagnosis present

## 2020-02-26 DIAGNOSIS — I6782 Cerebral ischemia: Secondary | ICD-10-CM | POA: Diagnosis not present

## 2020-02-26 DIAGNOSIS — J9811 Atelectasis: Secondary | ICD-10-CM | POA: Diagnosis not present

## 2020-02-26 DIAGNOSIS — Z87891 Personal history of nicotine dependence: Secondary | ICD-10-CM

## 2020-02-26 DIAGNOSIS — I2510 Atherosclerotic heart disease of native coronary artery without angina pectoris: Secondary | ICD-10-CM | POA: Diagnosis not present

## 2020-02-26 DIAGNOSIS — I5021 Acute systolic (congestive) heart failure: Secondary | ICD-10-CM | POA: Diagnosis not present

## 2020-02-26 DIAGNOSIS — B370 Candidal stomatitis: Secondary | ICD-10-CM | POA: Diagnosis present

## 2020-02-26 DIAGNOSIS — R609 Edema, unspecified: Secondary | ICD-10-CM | POA: Diagnosis not present

## 2020-02-26 DIAGNOSIS — I272 Pulmonary hypertension, unspecified: Secondary | ICD-10-CM | POA: Diagnosis present

## 2020-02-26 DIAGNOSIS — Z823 Family history of stroke: Secondary | ICD-10-CM

## 2020-02-26 DIAGNOSIS — Z20822 Contact with and (suspected) exposure to covid-19: Secondary | ICD-10-CM | POA: Diagnosis not present

## 2020-02-26 DIAGNOSIS — I480 Paroxysmal atrial fibrillation: Secondary | ICD-10-CM | POA: Diagnosis not present

## 2020-02-26 LAB — HEPATIC FUNCTION PANEL
ALT: 5 U/L (ref 0–44)
AST: 25 U/L (ref 15–41)
Albumin: 3.1 g/dL — ABNORMAL LOW (ref 3.5–5.0)
Alkaline Phosphatase: 106 U/L (ref 38–126)
Bilirubin, Direct: 0.3 mg/dL — ABNORMAL HIGH (ref 0.0–0.2)
Indirect Bilirubin: 0.6 mg/dL (ref 0.3–0.9)
Total Bilirubin: 0.9 mg/dL (ref 0.3–1.2)
Total Protein: 6.4 g/dL — ABNORMAL LOW (ref 6.5–8.1)

## 2020-02-26 LAB — TROPONIN I (HIGH SENSITIVITY)
Troponin I (High Sensitivity): 44 ng/L — ABNORMAL HIGH (ref <18)
Troponin I (High Sensitivity): 44 ng/L — ABNORMAL HIGH (ref <18)

## 2020-02-26 LAB — CBC
HCT: 37.4 % — ABNORMAL LOW (ref 39.0–52.0)
Hemoglobin: 12.3 g/dL — ABNORMAL LOW (ref 13.0–17.0)
MCH: 28.9 pg (ref 26.0–34.0)
MCHC: 32.9 g/dL (ref 30.0–36.0)
MCV: 87.8 fL (ref 80.0–100.0)
Platelets: 118 10*3/uL — ABNORMAL LOW (ref 150–400)
RBC: 4.26 MIL/uL (ref 4.22–5.81)
RDW: 14.3 % (ref 11.5–15.5)
WBC: 8.1 10*3/uL (ref 4.0–10.5)
nRBC: 0 % (ref 0.0–0.2)

## 2020-02-26 LAB — ECHOCARDIOGRAM COMPLETE
Area-P 1/2: 3.45 cm2
Height: 58 in
S' Lateral: 3.6 cm
Weight: 2560 oz

## 2020-02-26 LAB — HEMOGLOBIN A1C
Hgb A1c MFr Bld: 6.2 % — ABNORMAL HIGH (ref 4.8–5.6)
Mean Plasma Glucose: 131.24 mg/dL

## 2020-02-26 LAB — VITAMIN B12: Vitamin B-12: 1992 pg/mL — ABNORMAL HIGH (ref 180–914)

## 2020-02-26 LAB — BRAIN NATRIURETIC PEPTIDE: B Natriuretic Peptide: 598.7 pg/mL — ABNORMAL HIGH (ref 0.0–100.0)

## 2020-02-26 LAB — BASIC METABOLIC PANEL
Anion gap: 12 (ref 5–15)
BUN: 17 mg/dL (ref 8–23)
CO2: 20 mmol/L — ABNORMAL LOW (ref 22–32)
Calcium: 9.2 mg/dL (ref 8.9–10.3)
Chloride: 106 mmol/L (ref 98–111)
Creatinine, Ser: 1.05 mg/dL (ref 0.61–1.24)
GFR, Estimated: >60 mL/min (ref >60)
Glucose, Bld: 121 mg/dL — ABNORMAL HIGH (ref 70–99)
Potassium: 4 mmol/L (ref 3.5–5.1)
Sodium: 138 mmol/L (ref 135–145)

## 2020-02-26 LAB — RESP PANEL BY RT-PCR (FLU A&B, COVID) ARPGX2
Influenza A by PCR: NEGATIVE
Influenza B by PCR: NEGATIVE
SARS Coronavirus 2 by RT PCR: NEGATIVE

## 2020-02-26 LAB — TSH: TSH: 4.47 u[IU]/mL (ref 0.350–4.500)

## 2020-02-26 MED ORDER — HYDRALAZINE HCL 25 MG PO TABS
25.0000 mg | ORAL_TABLET | Freq: Three times a day (TID) | ORAL | Status: DC
  Administered 2020-02-27 – 2020-03-03 (×16): 25 mg via ORAL
  Filled 2020-02-26 (×16): qty 1

## 2020-02-26 MED ORDER — CARBIDOPA-LEVODOPA 25-100 MG PO TABS
2.0000 | ORAL_TABLET | Freq: Three times a day (TID) | ORAL | Status: DC
  Administered 2020-02-26 – 2020-03-03 (×17): 2 via ORAL
  Filled 2020-02-26 (×18): qty 2

## 2020-02-26 MED ORDER — POLYETHYLENE GLYCOL 3350 17 G PO PACK: 17.0000 g | PACK | Freq: Every day | ORAL | Status: DC | PRN

## 2020-02-26 MED ORDER — NYSTATIN-TRIAMCINOLONE 100000-0.1 UNIT/GM-% EX OINT
1.0000 "application " | TOPICAL_OINTMENT | Freq: Two times a day (BID) | CUTANEOUS | Status: DC
  Filled 2020-02-26: qty 15

## 2020-02-26 MED ORDER — LORAZEPAM 1 MG PO TABS
1.0000 mg | ORAL_TABLET | Freq: Once | ORAL | Status: AC
  Administered 2020-02-26: 1 mg via ORAL
  Filled 2020-02-26: qty 1

## 2020-02-26 MED ORDER — ALBUTEROL SULFATE HFA 108 (90 BASE) MCG/ACT IN AERS
1.0000 | INHALATION_SPRAY | Freq: Four times a day (QID) | RESPIRATORY_TRACT | Status: DC | PRN
  Filled 2020-02-26 (×2): qty 6.7

## 2020-02-26 MED ORDER — HEPARIN BOLUS VIA INFUSION
3100.0000 [IU] | Freq: Once | INTRAVENOUS | Status: AC
  Administered 2020-02-26: 3100 [IU] via INTRAVENOUS
  Filled 2020-02-26: qty 3100

## 2020-02-26 MED ORDER — FUROSEMIDE 10 MG/ML IJ SOLN
20.0000 mg | Freq: Two times a day (BID) | INTRAMUSCULAR | Status: DC
  Administered 2020-02-26: 20 mg via INTRAVENOUS
  Filled 2020-02-26: qty 2

## 2020-02-26 MED ORDER — CLOPIDOGREL BISULFATE 75 MG PO TABS: 75.0000 mg | ORAL_TABLET | Freq: Every day | ORAL | Status: DC

## 2020-02-26 MED ORDER — ASPIRIN EC 81 MG PO TBEC
81.0000 mg | DELAYED_RELEASE_TABLET | Freq: Every day | ORAL | Status: DC
  Administered 2020-02-27 – 2020-03-03 (×6): 81 mg via ORAL
  Filled 2020-02-26 (×6): qty 1

## 2020-02-26 MED ORDER — KETOROLAC TROMETHAMINE 0.5 % OP SOLN: 1.0000 [drp] | Freq: Every day | OPHTHALMIC | Status: DC

## 2020-02-26 MED ORDER — FUROSEMIDE 10 MG/ML IJ SOLN
40.0000 mg | Freq: Two times a day (BID) | INTRAMUSCULAR | Status: DC
  Administered 2020-02-27 – 2020-03-02 (×8): 40 mg via INTRAVENOUS
  Filled 2020-02-26 (×8): qty 4

## 2020-02-26 MED ORDER — ACETAMINOPHEN 325 MG PO TABS
650.0000 mg | ORAL_TABLET | Freq: Four times a day (QID) | ORAL | Status: DC | PRN
  Administered 2020-02-27 – 2020-02-28 (×2): 650 mg via ORAL
  Filled 2020-02-26 (×2): qty 2

## 2020-02-26 MED ORDER — FUROSEMIDE 10 MG/ML IJ SOLN
20.0000 mg | Freq: Once | INTRAMUSCULAR | Status: AC
  Administered 2020-02-26: 20 mg via INTRAVENOUS
  Filled 2020-02-26: qty 2

## 2020-02-26 MED ORDER — SODIUM CHLORIDE 0.9 % IV SOLN: 250.0000 mL | INTRAVENOUS | Status: DC | PRN

## 2020-02-26 MED ORDER — PANTOPRAZOLE SODIUM 40 MG PO TBEC
80.0000 mg | DELAYED_RELEASE_TABLET | Freq: Every day | ORAL | Status: DC
  Administered 2020-02-27 – 2020-03-02 (×5): 80 mg via ORAL
  Filled 2020-02-26 (×4): qty 2

## 2020-02-26 MED ORDER — SODIUM CHLORIDE 0.9% FLUSH
3.0000 mL | Freq: Two times a day (BID) | INTRAVENOUS | Status: DC
  Administered 2020-02-26 – 2020-03-03 (×9): 3 mL via INTRAVENOUS

## 2020-02-26 MED ORDER — ACETAMINOPHEN 650 MG RE SUPP: 650.0000 mg | Freq: Four times a day (QID) | RECTAL | Status: DC | PRN

## 2020-02-26 MED ORDER — TRIAMCINOLONE ACETONIDE 0.1 % EX CREA
TOPICAL_CREAM | Freq: Two times a day (BID) | CUTANEOUS | Status: DC
  Administered 2020-02-28 – 2020-03-02 (×2): 1 via TOPICAL
  Filled 2020-02-26: qty 15

## 2020-02-26 MED ORDER — NITROGLYCERIN 0.4 MG SL SUBL: 0.4000 mg | SUBLINGUAL_TABLET | SUBLINGUAL | Status: DC | PRN

## 2020-02-26 MED ORDER — POLYETHYL GLYCOL-PROPYL GLYCOL 0.4-0.3 % OP SOLN: OPHTHALMIC | Status: DC

## 2020-02-26 MED ORDER — SODIUM CHLORIDE 0.9% FLUSH: 3.0000 mL | INTRAVENOUS | Status: DC | PRN

## 2020-02-26 MED ORDER — HEPARIN (PORCINE) 25000 UT/250ML-% IV SOLN
900.0000 [IU]/h | INTRAVENOUS | Status: AC
  Administered 2020-02-26: 900 [IU]/h via INTRAVENOUS
  Filled 2020-02-26: qty 250

## 2020-02-26 MED ORDER — PRAVASTATIN SODIUM 10 MG PO TABS
20.0000 mg | ORAL_TABLET | Freq: Every day | ORAL | Status: DC
  Administered 2020-02-26 – 2020-03-02 (×5): 20 mg via ORAL
  Filled 2020-02-26 (×7): qty 2

## 2020-02-26 MED ORDER — HEPARIN (PORCINE) 25000 UT/250ML-% IV SOLN: 900.0000 [IU]/h | INTRAVENOUS | Status: AC

## 2020-02-26 MED ORDER — APIXABAN 5 MG PO TABS
5.0000 mg | ORAL_TABLET | Freq: Two times a day (BID) | ORAL | Status: DC
  Administered 2020-02-27 – 2020-03-03 (×12): 5 mg via ORAL
  Filled 2020-02-26 (×13): qty 1

## 2020-02-26 NOTE — Consult Note (Signed)
Neurology Consultation  Reason for Consult: confusion over baseline, agitation Referring Physician: Hollice Espy, MD  CC: confusion, agitation  History is obtained from: wife, chart  HPI: Brandon Rojas is a 75 y.o. male with a PMHx of TIA 08/2018, stroke in 2000 per wife with sequelae of left sided weakness, CAD s/p CABG, Parkinson's disease diagnosed in 2016 (followed by Dr. Carles Collet), anxiety, and depression. Brandon Rojas presented to the ED today for as 2 day progression of DOE, increased over the past week. He was also noted to have worsening LE edema. He has had functional decline over past several months. At [facility] pulmonology visit last week, they felt his DOE was most likely secondary to deconditioning and restrictive lung disease and Albuterol was prescribed.   In ED, labs with elevated BNP with CXR showing cardiomegaly. EKG with AF, which was either recently diagnosed or brand new today. Rate is controlled. Patient was placed on Heparin and Lasix.  Wife is concerned for worsening cognition over past two days, i.e., forgetting his wife's name and forgetting words in the middle of a statement. Per wife, he has been "picking at things in the air" and has never done this before. He has not been talking about people or things that are not present in room. He has not spoken of auditory hallucinations. In review of some concerning medications that patient may be taking at home, wife states he has taken Benadryl over past 1.5 weeks, but stopped 2 days ago. He rarely takes Zanaflex and has not taken Vesicare in months. She does state that his Dr. increased his Sinemet IR, as follows: 25/100 to ii at 7am, 1 at 11am, 2 at 3pm, and 1 at 7pm. He was taking 4 pills a day, now 6. This change occurred 6 mos ago.   In review of chart, he was diagnosed with PD in 2016, had a thalamic infarct at some time (every MRI NP reviewed on chart back 10 years, stated old infarct). In past neurology notes on chart, patient had  a TIA in 2020 after being off Plavix and ASA for a few days in anticipation of gallbladder surgery. He has been on Plavix and ASA since 2012 for cardiology reasons.   Neurology was asked to consult. Today on exam, he is smiling and laughing. He is oriented except to day and date. No signs of hallucinations and he is calm and cooperative.   ROS: A 14 point ROS was performed and is negative except as noted in HPI.   Past Medical History:  Diagnosis Date  . Anxiety state, unspecified   . Arthritis   . Benign neoplasm of colon   . CAD (coronary artery disease) of artery bypass graft 05/20/2018  . Depressive disorder, not elsewhere classified   . Diaphragmatic hernia without mention of obstruction or gangrene   . Diverticulosis of colon (without mention of hemorrhage)   . Esophageal reflux   . Esophageal stricture   . Hiatal hernia   . Mitral regurgitation   . Osteoarthrosis, unspecified whether generalized or localized, unspecified site   . Other and unspecified hyperlipidemia   . Parkinson's disease ([place])   . Personal history of colonic polyps   . Stroke ([place])   . Unspecified essential hypertension     Family History  Problem Relation Age of Onset  . Heart attack Mother   . Stroke Father   . Colon cancer Sister   . Healthy Sister   . Heart disease Brother   . Healthy  Brother   . Healthy Brother   . Healthy Brother   . Healthy Daughter   . Fibromyalgia Daughter   . Multiple sclerosis Son    Social History:   reports that he quit smoking about 48 years ago. He has never used smokeless tobacco. He reports that he does not drink alcohol and does not use drugs.  Medications  Current Facility-Administered Medications:  .  0.9 %  sodium chloride infusion, 250 mL, Intravenous, PRN, Bonnell Public Tublu, MD .  acetaminophen (TYLENOL) tablet 650 mg, 650 mg, Oral, Q6H PRN **OR** acetaminophen (TYLENOL) suppository 650 mg, 650 mg, Rectal, Q6H PRN, Jamse Arn, Dewaine Oats Tublu, MD .   albuterol (VENTOLIN HFA) 108 (90 Base) MCG/ACT inhaler 1-2 puff, 1-2 puff, Inhalation, Q6H PRN, Vashti Hey, MD .  Derrill Memo ON 02/27/2020] aspirin EC tablet 81 mg, 81 mg, Oral, Daily, Jamse Arn, Dewaine Oats Tublu, MD .  carbidopa-levodopa (SINEMET IR) 25-100 MG per tablet immediate release 2 tablet, 2 tablet, Oral, TID, Vashti Hey, MD .  Derrill Memo ON 02/27/2020] clopidogrel (PLAVIX) tablet 75 mg, 75 mg, Oral, Daily, Jamse Arn, Dewaine Oats Tublu, MD .  furosemide (LASIX) injection 20 mg, 20 mg, Intravenous, BID, Jamse Arn, Dewaine Oats Tublu, MD .  heparin ADULT infusion 100 units/mL (25000 units/269mL), 900 Units/hr, Intravenous, Continuous, Jamse Arn, Kyra Searles, MD, Last Rate: 9 mL/hr at 02/26/20 1346, 900 Units/hr at 02/26/20 1346 .  nitroGLYCERIN (NITROSTAT) SL tablet 0.4 mg, 0.4 mg, Sublingual, Q5 min PRN, Vashti Hey, MD .  Derrill Memo ON 02/27/2020] pantoprazole (PROTONIX) EC tablet 80 mg, 80 mg, Oral, Q1200, Jamse Arn, Dewaine Oats Tublu, MD .  polyethylene glycol (MIRALAX / GLYCOLAX) packet 17 g, 17 g, Oral, Daily PRN, Bonnell Public Tublu, MD .  pravastatin (PRAVACHOL) tablet 20 mg, 20 mg, Oral, q1800, Bonnell Public Tublu, MD .  sodium chloride flush (NS) 0.9 % injection 3 mL, 3 mL, Intravenous, Q12H, Jamse Arn, Dewaine Oats Tublu, MD .  sodium chloride flush (NS) 0.9 % injection 3 mL, 3 mL, Intravenous, PRN, Jamse Arn, Dewaine Oats Tublu, MD .  triamcinolone (KENALOG) 0.1 % cream, , Topical, BID, Jamse Arn, Kyra Searles, MD  Exam: Current vital signs: BP (!) 163/82   Pulse 65   Temp 97.8 F (36.6 C) (Oral)   Resp 18   Ht 4\' 10"  (1.473 m)   Wt 72.6 kg   SpO2 97%   BMI 33.44 kg/m  Vital signs in last 24 hours: Temp:  [97.8 F (36.6 C)-98.3 F (36.8 C)] 97.8 F (36.6 C) (02/18 1520) Pulse Rate:  [57-73] 65 (02/18 1520) Resp:  [15-24] 18 (02/18 1330) BP: (143-193)/(82-119) 163/82 (02/18 1520) SpO2:  [93 %-98 %] 97 % (02/18 1520) Weight:  [72.6 kg]  72.6 kg (02/18 1200)  GENERAL: Awake, alert in NAD HEENT: - Normocephalic and atraumatic, masked facies. Dry mm, no LN++, no thyromegaly. Bilateral injection of conjunctiva, right worse than left. No periorbital swelling or redness.  LUNGS - Normal respiratory effort. SaO2 97% on RA.  CV - AF with rate controlled on tele. Bilateral LE edema.  ABDOMEN - Soft, nontender Ext: warm, well perfused Psych: calm, cooperative, laughing appropriately  NEURO:  Mental Status: AA&O to self, age, birthdate, hospital, city, and state. Not oriented to month, day, or date.  Speech/Language: speech without dysarthria. Naming, repetition, fluency, and comprehension intact. Slightly bradyphrenic.  Cranial Nerves:  II: PERRL 49mm/brisk. visual fields full.  III, IV, VI: EOMI. Lid elevation symmetric and full.  V: sensation is intact and symmetrical to face.  VII: Smile is symmetrical. Able to puff  cheeks and raise eyebrows.  VIII:hearing intact to voice IX, X: palate elevation is symmetric. Phonation slightly softened.  XI: normal sternocleidomastoid and trapezius muscle strength XII: tongue is symmetrical without fasciculations.   Motor: 5/5 strength to all muscle groups except left triceps 4+/5 and plantar and dorsiflexion LLE 4/5. Tone is normal. Bulk is normal.  Sensation- Intact to light touch bilaterally in all four extremities. Extinguishing absent to DSS.   Coordination: FTN intact bilaterally. No drift UE/LE.   DTRs: 2+ throughout UE, 2+ right patella. 1+ left patella.  Gait- deferred  Labs I have reviewed labs in epic and the results pertinent to this consultation are: K 4.0   Na 138   WBCC 8.1   BNP 598   Trop 44   Glucose 121 CBC    Component Value Date/Time   WBC 8.1 02/26/2020 1003   RBC 4.26 02/26/2020 1003   HGB 12.3 (L) 02/26/2020 1003   HCT 37.4 (L) 02/26/2020 1003   PLT 118 (L) 02/26/2020 1003   MCV 87.8 02/26/2020 1003   MCH 28.9 02/26/2020 1003   MCHC 32.9 02/26/2020 1003    RDW 14.3 02/26/2020 1003   LYMPHSABS 1.4 03/09/2019 1438   MONOABS 0.6 03/09/2019 1438   EOSABS 0.4 03/09/2019 1438   BASOSABS 0.1 03/09/2019 1438    CMP     Component Value Date/Time   NA 138 02/26/2020 1003   K 4.0 02/26/2020 1003   CL 106 02/26/2020 1003   CO2 20 (L) 02/26/2020 1003   GLUCOSE 121 (H) 02/26/2020 1003   BUN 17 02/26/2020 1003   CREATININE 1.05 02/26/2020 1003   CALCIUM 9.2 02/26/2020 1003   PROT 6.4 (L) 02/26/2020 1003   ALBUMIN 3.1 (L) 02/26/2020 1003   AST 25 02/26/2020 1003   ALT 5 02/26/2020 1003   ALKPHOS 106 02/26/2020 1003   BILITOT 0.9 02/26/2020 1003   GFRNONAA >60 02/26/2020 1003   GFRAA >60 08/26/2018 2103    Lipid Panel     Component Value Date/Time   CHOL 128 03/09/2019 1438   TRIG 101.0 03/09/2019 1438   HDL 52.50 03/09/2019 1438   CHOLHDL 2 03/09/2019 1438   VLDL 20.2 03/09/2019 1438   LDLCALC 56 03/09/2019 1438     Imaging  MRI brain from 2020 1. No acute intracranial infarct or other abnormality identified. 2. Age-appropriate cerebral atrophy with moderate chronic microvascular ischemic disease, with superimposed chronic right thalamic lacunar infarct. CTA head in 2004 1.No acute intracranial abnormality.  Chronic findings detailed above. 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 Right PCA is supplied by a small posterior communicating artery with superimposed suspected high-grade stenosis of the proximal P2 segment. This likely accounts for relatively diminished opacification of distal right PCA branches.  02/26/20 MRI brain is pending.   Assessment: 75 yo male with a 2 day history of confusion and possibly visual hallucinations. Admitted for CHF decompensation and AF. Neurology was asked to consult for confusion. Given his history of stroke and this confusion with new AF we requested a MRI brain which is pending. He has had an echo which showed an EF of 60% and mild LVH. No embolus.   The Benadryl is concerning  for his confusion as anticholinergics can cause this in the elderly. 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   Recommendations: -We will follow MRI brain to r/o stroke.  -If MRI positive for stroke, consider TEE with new AF and full stroke workup.  -Continue home  Plavix and ASA.  -Check TSH, Vitamin B12, HbA1c. -ordered.  -Delirium precautions -Avoid medications that can cause confusion in the elderly, like Anticholinergics and Benzos.   Pt seen by Clance Boll, NP/Neuro and later by MD. Note/plan to be edited by MD as needed.  Pager: [phone]  Attestation:  I saw this patient with the APP on 02/26/20, obtained pertinent aspects of the history, and performed relevant physical and neurological examination as documented. Also, I reviewed the available laboratory data and neuroimages, and other relevant tests/notes/procedures.  Brandon Rojas is pleasant and cooperative. He notes some confusion, and admits to visual hallucination x 1. He says that he saw his granddaugther. Looking back through the chart, he's had progressive SOB for a few weeks. Today he was noted to be in AFIB, and to have diastolic CHF. Severe pedal edema. Several medications on board that can impact cognitive functioning. We are asked to consult for new onset hallucinations/confusion.   My examination findings include bradyphrenia, bradykinesia, tremor at rest right > left. Increased tone with mild cogwheeling. Strength excellent. Normal coordination on FO task. Sensation OK in UEs. Reduced reflexes throughout. Knows where he is, but disoriented to time.   Impression: Parkinson's disease on relatively low dose of sinemet as his only medication. New onset hallucination in the context of diastolic CHF and new onset AFIB/flutter.  History of stroke in the past, for which he's on DAPT.  Recommendations: MRI brain to rule out embolic strokes. Correct heart failure to  extent possible. Continue DAPT. Additional labs as outlined. Avoid or reduce medications that can contribute to confusion/hallucinations as outlined by Ms. Baltazar Najjar.  Will continue to follow along with you.   Thank you.  Perfecto Kingdom, MD

## 2020-02-26 NOTE — ED Notes (Signed)
Admitting MD at bedside.

## 2020-02-26 NOTE — Progress Notes (Signed)
  Echocardiogram 2D Echocardiogram has been performed.  Randa Lynn Md Sm[place] 02/26/2020, 4:14 PM

## 2020-02-26 NOTE — ED Provider Notes (Signed)
Emergency Department Provider Note   I have reviewed the triage vital signs and the nursing notes.   HISTORY  Chief Complaint Shortness of Breath   HPI Brandon Rojas is a 75 y.o. male with past medical history reviewed below presents to the emergency department for evaluation of continued shortness of breath, generalized weakness, cough.  The patient's wife is at bedside and provides most of the history.  She states that symptoms began in late December and have progressively worsened.  They have been to see Dr. Lamonte Sakai with Pulmonology along with her primary care doctor.  In chart review the patient had PFT which did not show obstructive pathology to correlate with the patient's wheezing.  He was referred to pulmonary rehab but the patient's wife states they have not been called regarding an appointment as of yet.  He has not seen his cardiologist since symptoms began.  His wife has noticed significant swelling in his legs since the onset of symptoms.  He does not take Lasix or other diuretic.  Patient describes intermittent chest pain but wife states it mainly seems to be when he is short of breath he will grab his side.  No active pain at this time.  No abdominal pain or vomiting. Symptoms are with exertion but occasionally come when the patient is at rest on the couch.    Past Medical History:  Diagnosis Date  . Anxiety state, unspecified   . Arthritis   . Benign neoplasm of colon   . CAD (coronary artery disease) of artery bypass graft 05/20/2018  . Depressive disorder, not elsewhere classified   . Diaphragmatic hernia without mention of obstruction or gangrene   . Diverticulosis of colon (without mention of hemorrhage)   . Esophageal reflux   . Esophageal stricture   . Hiatal hernia   . Mitral regurgitation   . Osteoarthrosis, unspecified whether generalized or localized, unspecified site   . Other and unspecified hyperlipidemia   . Parkinson's disease ([place])   . Personal history  of colonic polyps   . Stroke ([place])   . Unspecified essential hypertension     Patient Active Problem List   Diagnosis Date Noted  . Dyspnea 01/15/2020  . Type 2 diabetes mellitus with complication, without [name]-term current use of insulin ([place]) 06/30/2019  . Educated about COVID-19 virus infection 06/30/2019  . TIA (transient ischemic attack) 08/26/2018  . Nocturia 05/30/2018  . Elevated troponin 05/20/2018  . Epigastric pain 05/20/2018  . CAD (coronary artery disease) of artery bypass graft 05/20/2018  . Acute cholecystitis 05/20/2018  . Subacute left lumbar radiculopathy 06/23/2015  . Mitral regurgitation 09/17/2014  . Parkinson's disease ([place]) 08/19/2014  . Pulmonary hypertension ([place]) 03/30/2014  . Piriformis syndrome of left side 03/02/2014  . Peripheral edema 01/15/2014  . Movement disorder 01/15/2014  . Hypercalcemia 01/15/2014  . Left hip pain 01/15/2014  . Hoarseness 01/15/2014  . Back pain 06/08/2010  . Encounter for [name]-term (current) use of high-risk medication 06/08/2010  . ED (erectile dysfunction) 06/08/2010  . Preventative health care 06/07/2010  . SHOULDER PAIN, LEFT 08/30/2009  . CAROTID ARTERY DISEASE 01/25/2009  . BRADYCARDIA 10/08/2007  . COLONIC POLYPS 03/20/2007  . HIATAL HERNIA 03/20/2007  . DIVERTICULOSIS, COLON 03/20/2007  . Hyperlipidemia 09/19/2006  . OBESITY 09/19/2006  . Anxiety state 09/19/2006  . ERECTILE DYSFUNCTION 09/19/2006  . Depression 09/19/2006  . Essential hypertension 09/19/2006  . Coronary atherosclerosis 09/19/2006  . ALLERGIC RHINITIS 09/19/2006  . GERD 09/19/2006  . OSTEOARTHRITIS 09/19/2006  .  TRANSIENT ISCHEMIC ATTACK, HX OF 09/19/2006  . PSORIASIS, HX OF 09/19/2006    Past Surgical History:  Procedure Laterality Date  . COLONOSCOPY    . CORONARY ARTERY BYPASS GRAFT     x 2  . INGUINAL HERNIA REPAIR     bilateral  . IR EXCHANGE BILIARY DRAIN  11/06/2018  . IR PERC CHOLECYSTOSTOMY  05/22/2018  . IR RADIOLOGIST  EVAL & MGMT  06/24/2018  . IR RADIOLOGIST EVAL & MGMT  07/01/2018  . LUNG BIOPSY    . POLYPECTOMY    . PTCA      Allergies Atorvastatin and Simvastatin  Family History  Problem Relation Age of Onset  . Heart attack Mother   . Stroke Father   . Colon cancer Sister   . Healthy Sister   . Heart disease Brother   . Healthy Brother   . Healthy Brother   . Healthy Brother   . Healthy Daughter   . Fibromyalgia Daughter   . Multiple sclerosis Son     Social History Social History   Tobacco Use  . Smoking status: Former Smoker    Quit date: 02/19/1972    Years since quitting: 48.0  . Smokeless tobacco: Never Used  Vaping Use  . Vaping Use: Never used  Substance Use Topics  . Alcohol use: No    Alcohol/week: 0.0 standard drinks  . Drug use: No    Review of Systems  Constitutional: No fever/chills. Positive generalized weakness.  Eyes: No visual changes. ENT: No sore throat. Cardiovascular: Positive chest pain. Respiratory: Positive shortness of breath. Gastrointestinal: No abdominal pain.  No nausea, no vomiting.  No diarrhea.  No constipation. Genitourinary: Negative for dysuria. Musculoskeletal: Negative for back pain. Skin: Negative for rash. Neurological: Negative for headaches, focal weakness or numbness.  10-point ROS otherwise negative.  ____________________________________________   PHYSICAL EXAM:  VITAL SIGNS: ED Triage Vitals [02/26/20 0941]  Enc Vitals Group     BP (!) 178/107     Pulse Rate 72     Resp (!) 24     Temp 98.3 F (36.8 C)     Temp Source Oral     SpO2 93 %   Constitutional: Alert and oriented. Well appearing and in no acute distress but becomes very SOB when standing to pivot from the wheelchair to the bed.  Eyes: Conjunctivae are normal. Head: Atraumatic. Nose: No congestion/rhinnorhea. Mouth/Throat: Mucous membranes are moist.   Neck: No stridor.   Cardiovascular: Normal rate but irregular. Good peripheral circulation.  Grossly normal heart sounds. JVP to the angle of the mandible on the left.  Respiratory: Increased respiratory effort.  No retractions. Lungs with faint wheezing, end-expiratory. No rales or rhonchi.  Gastrointestinal: Soft and nontender. No distention.  Musculoskeletal: No lower extremity tenderness with 3+ pitting edema bilaterally. No gross deformities of extremities. Neurologic:  Normal speech and language. Skin:  Skin is warm, dry and intact. No rash noted.   ____________________________________________   LABS (all labs ordered are listed, but only abnormal results are displayed)  Labs Reviewed  BASIC METABOLIC PANEL - Abnormal; Notable for the following components:      Result Value   CO2 20 (*)    Glucose, Bld 121 (*)    All other components within normal limits  CBC - Abnormal; Notable for the following components:   Hemoglobin 12.3 (*)    HCT 37.4 (*)    Platelets 118 (*)    All other components within normal limits  BRAIN NATRIURETIC  PEPTIDE - Abnormal; Notable for the following components:   B Natriuretic Peptide 598.7 (*)    All other components within normal limits  HEPATIC FUNCTION PANEL - Abnormal; Notable for the following components:   Total Protein 6.4 (*)    Albumin 3.1 (*)    Bilirubin, Direct 0.3 (*)    All other components within normal limits  TROPONIN I (HIGH SENSITIVITY) - Abnormal; Notable for the following components:   Troponin I (High Sensitivity) 44 (*)    All other components within normal limits  RESP PANEL BY RT-PCR (FLU A&B, COVID) ARPGX2  HEPARIN LEVEL (UNFRACTIONATED)  TROPONIN I (HIGH SENSITIVITY)   ____________________________________________  EKG   EKG Interpretation  Date/Time:  Friday February 26 2020 09:42:08 EST Ventricular Rate:  69 PR Interval:    QRS Duration: 126 QT Interval:  458 QTC Calculation: 490 R Axis:   111 Text Interpretation: Atrial fibrillation with premature ventricular or aberrantly conducted complexes  Right bundle branch block T wave abnormality, consider inferolateral ischemia Abnormal ECG Confirmed by Pattricia Boss [phone]) on 02/26/2020 9:48:17 AM       ____________________________________________  RADIOLOGY  DG Chest 2 View  Result Date: 02/26/2020 CLINICAL DATA:  Dyspnea and cough for 3 weeks EXAM: CHEST - 2 VIEW COMPARISON:  01/15/2020 chest radiograph. FINDINGS: Intact sternotomy wires. Stable cardiomediastinal silhouette with moderate cardiomegaly and moderate to large hiatal hernia. No pneumothorax. No pleural effusion. Cephalization of the pulmonary vasculature without overt pulmonary edema. Low lung volumes with medial left lung base atelectasis. IMPRESSION: 1. Low lung volumes with medial left lung base atelectasis. 2. Moderate cardiomegaly without overt pulmonary edema. 3. Moderate to large hiatal hernia. Electronically Signed   By: Ilona Sorrel M.D.   On: 02/26/2020 10:13    ____________________________________________   PROCEDURES  Procedure(s) performed:   .Critical Care Performed by: Margette Fast, MD Authorized by: Margette Fast, MD   Critical care provider statement:    Critical care time (minutes):  35   Critical care time was exclusive of:  Separately billable procedures and treating other patients and teaching time   Critical care was necessary to treat or prevent imminent or life-threatening deterioration of the following conditions:  Respiratory failure and circulatory failure   Critical care was time spent personally by me on the following activities:  Discussions with consultants, evaluation of patient's response to treatment, examination of patient, ordering and performing treatments and interventions, ordering and review of laboratory studies, ordering and review of radiographic studies, pulse oximetry, re-evaluation of patient's condition, obtaining history from patient or surrogate, review of old charts, blood draw for specimens and development of  treatment plan with patient or surrogate   I assumed direction of critical care for this patient from another provider in my specialty: no     Care discussed with: admitting provider       ____________________________________________   INITIAL IMPRESSION / [place] / ED COURSE  Pertinent labs & imaging results that were available during my care of the patient were reviewed by me and considered in my medical decision making (see chart for details).   Patient presents to the emergency department valuation of intermittent shortness of breath.  There is some wheezing.  I am noting some significant lower extremity edema in both legs.  My suspicion for PE is lower.  Patient had pulmonology work-up and January/February with no obstructive pathology on his pulmonary function test.  Question of some restrictive contributing factors per pulmonology note.  Recommended outpatient  pulmonary rehab and return to cardiology given the leg swelling but patient has not been able to make these appointments as of yet.  He is not hypoxemic but gets very red in the face with bulging veins with even minor exertion.  Once at rest in the bed seems to feel much better.  Unsure if this is new CHF.  Patient's EKG here is different from priors with some A. Fib appearance.  No RVR.   11:50 AM  Patient with elevated BNP as well as mild elevation in troponin.  Doubt CAD primarily.  Low suspicion overall for PE.  In this clinical setting, suspect new CHF with new atrial fibrillation causing many of the patient's symptoms.  He has significant JVP on exam.  We will give 20 mg Lasix IV to start diuresis and titrate from there.  Given the patient's significant increased work of breathing with even minimal movement will opt for diuresis and work-up as an inpatient.   Discussed patient's case with [facility], Dr. Jamse Arn to request admission. Patient and family (if present) updated with plan. Care transferred to [facility] service.  I  reviewed all nursing notes, vitals, pertinent old records, EKGs, labs, imaging (as available).  ____________________________________________  FINAL CLINICAL IMPRESSION(S) / ED DIAGNOSES  Final diagnoses:  SOB (shortness of breath)  Atrial fibrillation, unspecified type (HCC)  Lower extremity edema    MEDICATIONS GIVEN DURING THIS VISIT:  Medications  furosemide (LASIX) injection 20 mg (has no administration in time range)  heparin bolus via infusion 3,100 Units (has no administration in time range)  heparin ADULT infusion 100 units/mL (25000 units/239mL) (has no administration in time range)    Note:  This document was prepared using Dragon voice recognition software and may include unintentional dictation errors.  Nanda Quinton, MD, [facility] Emergency [facility]    Iann Rodier, [facility], MD 02/26/20 [phone]

## 2020-02-26 NOTE — ED Notes (Signed)
PT's wife gave his daily Carbidopa Levodopa 25/100 tablet.  Dr Laverta Baltimore is aware.

## 2020-02-26 NOTE — Progress Notes (Signed)
ANTICOAGULATION CONSULT NOTE - Initial Consult  Pharmacy Consult for Heparin Indication: atrial fibrillation  Allergies  Allergen Reactions  . Atorvastatin Other (See Comments)    Muscle cramps  . Simvastatin Other (See Comments)    Muscle cramps    Patient Measurements:   Heparin Dosing Weight: 61.5 kg   Vital Signs: Temp: 98.3 F (36.8 C) (02/18 0941) Temp Source: Oral (02/18 0941) BP: 143/104 (02/18 1115) Pulse Rate: 66 (02/18 1115)  Labs: Recent Labs    02/26/20 1003  HGB 12.3*  HCT 37.4*  PLT 118*  CREATININE 1.05  TROPONINIHS 44*    CrCl cannot be calculated (Unknown ideal weight.).   Medical History: Past Medical History:  Diagnosis Date  . Anxiety state, unspecified   . Arthritis   . Benign neoplasm of colon   . CAD (coronary artery disease) of artery bypass graft 05/20/2018  . Depressive disorder, not elsewhere classified   . Diaphragmatic hernia without mention of obstruction or gangrene   . Diverticulosis of colon (without mention of hemorrhage)   . Esophageal reflux   . Esophageal stricture   . Hiatal hernia   . Mitral regurgitation   . Osteoarthrosis, unspecified whether generalized or localized, unspecified site   . Other and unspecified hyperlipidemia   . Parkinson's disease ([place])   . Personal history of colonic polyps   . Stroke ([place])   . Unspecified essential hypertension     Assessment: 75 yo male presents on 02/26/2020 with SOB and cough x 2-3 weeks and weakness. Pharmacy consulted to dose heparin for Afib. Patient is not on anticoagulation prior to admission. Hgb 12.3. Plt 118 - last 235 in March 2021. No reported bleeding   Goal of Therapy:  Heparin level 0.3-0.7 units/ml Monitor platelets by anticoagulation protocol: Yes   Plan:  Heparin 3100 units bolus  Start heparin 900 units/hr  Obtain heparin level at 8 hrs  Monitor heparin level, CBC and S/s of bleeding daily  Follow up plans to transition to oral anticoagulant  Cristela Felt, PharmD Clinical Pharmacist  02/26/2020,11:59 AM

## 2020-02-26 NOTE — Progress Notes (Signed)
ANTICOAGULATION CONSULT NOTE - Follow Up Consult  Pharmacy Consult for Heparin > Apixaban Indication: atrial fibrillation  Allergies  Allergen Reactions  . Atorvastatin Other (See Comments)    Muscle cramps  . Simvastatin Other (See Comments)    Muscle cramps    Patient Measurements: Height: 5\' 4"  (162.6 cm) (pt stated he is 5'4) Weight: 74.8 kg (165 lb) IBW/kg (Calculated) : 59.2  Vital Signs: Temp: 97.8 F (36.6 C) (02/18 1520) Temp Source: Oral (02/18 1520) BP: 163/82 (02/18 1520) Pulse Rate: 65 (02/18 1520)  Labs: Recent Labs    02/26/20 1003 02/26/20 1249  HGB 12.3*  --   HCT 37.4*  --   PLT 118*  --   CREATININE 1.05  --   TROPONINIHS 44* 44*    Estimated Creatinine Clearance: 56.2 mL/min (by C-G formula based on SCr of 1.05 mg/dL).  Assessment:  75 yo male presents on 02/26/2020 with SOB and cough x 2-3 weeks and weakness. Pharmacy consulted to dose heparin for Afib. Patient is not on anticoagulation prior to admission. Hgb 12.3. Plt 118 - last 235 in March 2021. No reported bleeding     IV heparin begun this afternoon > now to change to Apixaban.   PTA Plavix discontinued; also on PTA ASA 81 mg daily.  Goal of Therapy:  Heparin level 0.3-0.7 units/ml appropriate Apixaban regimen for indication Monitor platelets by anticoagulation protocol: Yes   Plan:   Apixaban 5 mg BID to begin tonight.  Heparin drip to stop when giving first Apixaban dose.  Arty Baumgartner, [place] 02/26/2020,7:01 PM

## 2020-02-26 NOTE — ED Notes (Signed)
Report given.  Was notified by floor that room is still dirty.

## 2020-02-26 NOTE — H&P (Signed)
History and Physical:    [name]   FGH:[id number] DOB: 1945-04-16 DOA: 02/26/2020  Referring MD/provider: Dr. Laverta Baltimore PCP: Biagio Borg, MD   Patient coming from: Home  Chief Complaint: Progressive dyspnea on exertion, significantly increased over the past week  History of Present Illness:   NICOLO Rojas is an 75 y.o. male with Parkinson's disease, CAD status post CABG, history of TIA who has had progressive dyspnea and functional decline over several months.  He was seen by [facility] 2 weeks ago who noted progressive DOE was most likely secondary to deconditioning and restrictive lung disease given low lung volumes.  Patient and wife note that over the past week patient has gotten significantly more short of breath with minimal exertion.  Apparently he used to be short of breath walking out to his porch but over the past week he is now short of breath just walking across the room or even just moving from his bedside chair to his bed.  Patient denies any fevers or chills or cough.  However they have noted that he has had marked worsening of his lower extremity edema.  Notes that he is edema would usually resolve overnight but now has extended up to his thighs.  He apparently weighed 148 pounds a couple of months ago and now weighs 160 pounds.  Review of systems is notable for patient's concern that his cognition is worse.  Notes that he forgets what he is saying in the middle of a statement.  Notes that he cannot remember words.  His wife states that he has been confused over the past 2 to 3 days, called her by her granddaughter's name which is unusual for him.  She also notes that he has been "picking at things in the air as if some is there and there is really nothing there".  Patient denies chest pain, palpitations, dizziness, syncope or presyncope.  Other than albuterol inhaler, there has been no new medication changes.  No nausea vomiting or diarrhea.  ED Course:  The  patient was noted to be afebrile and hypertensive.  Lungs were noted to be clear.  Laboratory data were notable for elevated BNP and mild elevation in troponin.  EKG was notable for atrial fibrillation which was apparently newly diagnosed and/or new onset.  JVP on exam was noted.  Chest x-ray showed cardiomegaly with some increased vascularization, no overt edema.  Patient was treated with Lasix 20 mg IV and started on heparin.  Patient was already rate controlled on admission.  ROS:   ROS   Review of Systems: General: Denies fever, chills, malaise,  Respiratory: Denies hemoptysis Cardiovascular: Denies chest pain or palpitations GI: Denies nausea, vomiting, diarrhea or constipation GU: Denies dysuria, frequency or hematuria   Past Medical History:   Past Medical History:  Diagnosis Date  . Anxiety state, unspecified   . Arthritis   . Benign neoplasm of colon   . CAD (coronary artery disease) of artery bypass graft 05/20/2018  . Depressive disorder, not elsewhere classified   . Diaphragmatic hernia without mention of obstruction or gangrene   . Diverticulosis of colon (without mention of hemorrhage)   . Esophageal reflux   . Esophageal stricture   . Hiatal hernia   . Mitral regurgitation   . Osteoarthrosis, unspecified whether generalized or localized, unspecified site   . Other and unspecified hyperlipidemia   . Parkinson's disease ([place])   . Personal history of colonic polyps   . Stroke (  [place])   . Unspecified essential hypertension     Past Surgical History:   Past Surgical History:  Procedure Laterality Date  . COLONOSCOPY    . CORONARY ARTERY BYPASS GRAFT     x 2  . INGUINAL HERNIA REPAIR     bilateral  . IR EXCHANGE BILIARY DRAIN  11/06/2018  . IR PERC CHOLECYSTOSTOMY  05/22/2018  . IR RADIOLOGIST EVAL & MGMT  06/24/2018  . IR RADIOLOGIST EVAL & MGMT  07/01/2018  . LUNG BIOPSY    . POLYPECTOMY    . PTCA      Social History:   Social History   Socioeconomic  History  . Marital status: Married    Spouse name: Not on file  . Number of children: 3  . Years of education: Not on file  . Highest education level: Doctorate  Occupational History  . Occupation: Mining engineer: [employer]  Tobacco Use  . Smoking status: Former Smoker    Quit date: 02/19/1972    Years since quitting: 48.0  . Smokeless tobacco: Never Used  Vaping Use  . Vaping Use: Never used  Substance and Sexual Activity  . Alcohol use: No    Alcohol/week: 0.0 standard drinks  . Drug use: No  . Sexual activity: Not on file  Other Topics Concern  . Not on file  Social History Narrative  . Not on file   Social Determinants of Health   Financial Resource Strain: Low Risk   . Difficulty of Paying Living Expenses: Not hard at all  Food Insecurity: No Food Insecurity  . Worried About Charity fundraiser in the Last Year: Never true  . Ran Out of Food in the Last Year: Never true  Transportation Needs: No Transportation Needs  . Lack of Transportation (Medical): No  . Lack of Transportation (Non-Medical): No  Physical Activity: Inactive  . Days of Exercise per Week: 0 days  . Minutes of Exercise per Session: 0 min  Stress: No Stress Concern Present  . Feeling of Stress : Not at all  Social Connections: Socially Integrated  . Frequency of Communication with Friends and Family: More than three times a week  . Frequency of Social Gatherings with Friends and Family: More than three times a week  . Attends Religious Services: More than 4 times per year  . Active Member of Clubs or Organizations: Yes  . Attends Archivist Meetings: More than 4 times per year  . Marital Status: Married  Human resources officer Violence: Not At Risk  . Fear of Current or Ex-Partner: No  . Emotionally Abused: No  . Physically Abused: No  . Sexually Abused: No    Allergies   Atorvastatin and Simvastatin  Family history:   Family History  Problem Relation Age of  Onset  . Heart attack Mother   . Stroke Father   . Colon cancer Sister   . Healthy Sister   . Heart disease Brother   . Healthy Brother   . Healthy Brother   . Healthy Brother   . Healthy Daughter   . Fibromyalgia Daughter   . Multiple sclerosis Son     Current Medications:   Prior to Admission medications   Medication Sig Start Date End Date Taking? Authorizing Provider  albuterol (VENTOLIN HFA) 108 (90 Base) MCG/ACT inhaler Inhale 1-2 puffs into the lungs every 6 (six) hours as needed for wheezing or shortness of breath. 02/11/20  Yes Collene Gobble,  MD  aspirin 81 MG tablet Take 81 mg by mouth daily.   Yes [provider]  carbidopa-levodopa (SINEMET IR) 25-100 MG tablet 2 at 7am, 1 at 11am, 2 at 3pm, 1 at 7pm 08/28/19  Yes Tat, Wells Guiles S, DO  clopidogrel (PLAVIX) 75 MG tablet TAKE 1 TABLET EVERY DAY WITH BREAKFAST 09/24/19  Yes Biagio Borg, MD  diphenhydrAMINE (BENADRYL) 50 MG tablet Take 1 tablet (50 mg total) by mouth at bedtime as needed for itching. 02/12/20  Yes Stover, Lady Saucier, DPM  esomeprazole (NEXIUM) 40 MG capsule TAKE 1 CAPSULE BY MOUTH ONCE DAILY 03/13/19  Yes Biagio Borg, MD  lovastatin (MEVACOR) 20 MG tablet TAKE 1 TABLET AT BEDTIME 09/24/19  Yes Biagio Borg, MD  Multiple Vitamin (MULTIVITAMIN WITH MINERALS) TABS tablet Take 1 tablet by mouth daily.   Yes [provider]  nystatin-triamcinolone ointment (MYCOLOG) Apply 1 application topically 2 (two) times daily. 02/12/20  Yes Stover, Titorya, DPM  Polyethyl Glycol-Propyl Glycol (SYSTANE ULTRA) 0.4-0.3 % SOLN Apply to eye as directed.   Yes [provider]  tiZANidine (ZANAFLEX) 4 MG tablet Take 1 tablet (4 mg total) by mouth every 6 (six) hours as needed for muscle spasms. 09/10/19  Yes Biagio Borg, MD  traMADol (ULTRAM) 50 MG tablet TAKE 1 TABLET BY MOUTH EVERY 6 HOURS AS NEEDED FOR PAIN 09/11/19  Yes Biagio Borg, MD  B Complex Vitamins (VITAMIN B COMPLEX PO) Take 1 tablet by mouth as  needed. Patient not taking: Reported on 02/26/2020    [provider]  diclofenac (VOLTAREN) 0.1 % ophthalmic solution  11/23/19   [provider]  doxycycline (VIBRA-TABS) 100 MG tablet Take 1 tablet (100 mg total) by mouth 2 (two) times daily. 01/26/20   Collene Gobble, MD  DUREZOL 0.05 % EMUL  06/02/19   [provider]  ketoconazole (NIZORAL) 2 % cream Apply 1 application topically daily. 02/01/20   Biagio Borg, MD  nitroGLYCERIN (NITROSTAT) 0.4 MG SL tablet PLACE 1 TABLET (0.4 MG TOTAL) UNDER THE TONGUE EVERY 5 (FIVE) MINUTES AS NEEDED. 07/01/19   Minus Breeding, MD  PROLENSA 0.07 % SOLN Place 1 drop into the left eye at bedtime. Patient not taking: Reported on 02/26/2020 06/02/19   [provider]  solifenacin (VESICARE) 5 MG tablet Take 1 tablet (5 mg total) by mouth daily. Patient not taking: Reported on 02/26/2020 03/11/19   Biagio Borg, MD    Physical Exam:   Vitals:   02/26/20 1045 02/26/20 1100 02/26/20 1115 02/26/20 1200  BP: (!) 163/82 (!) 193/88 (!) 143/104   Pulse: 66 69 66   Resp: 16 15 20    Temp:      TempSrc:      SpO2: 95% 95% 98%   Weight:    72.6 kg  Height:    4\' 10"  (1.473 m)     Physical Exam: Blood pressure (!) 143/104, pulse 66, temperature 98.3 F (36.8 C), temperature source Oral, resp. rate 20, height 4\' 10"  (1.473 m), weight 72.6 kg, SpO2 98 %. Gen: Chronically ill-appearing man who is moving somewhat slowly and speaking slowly and softly with attentive wife at bedside Eyes: sclera anicteric, conjuctiva mildly injected bilaterally CVS: S1-S2, regulary, no gallops Respiratory:  decreased air entry likely secondary to decreased inspiratory effort, rales at bases GI: NABS, soft, NT  LE: 2-3+ edema extending all the way up to his buttocks and dependent areas. Neuro: Masked facies, bradykinesia, hypophonia. Psych:  mood and  affect appropriate to situation.   Data Review:    Labs: Basic Metabolic Panel: Recent Labs   Lab 02/26/20 1003  NA 138  K 4.0  CL 106  CO2 20*  GLUCOSE 121*  BUN 17  CREATININE 1.05  CALCIUM 9.2   Liver Function Tests: Recent Labs  Lab 02/26/20 1003  AST 25  ALT 5  ALKPHOS 106  BILITOT 0.9  PROT 6.4*  ALBUMIN 3.1*   No results for input(s): LIPASE, AMYLASE in the last 168 hours. No results for input(s): AMMONIA in the last 168 hours. CBC: Recent Labs  Lab 02/26/20 1003  WBC 8.1  HGB 12.3*  HCT 37.4*  MCV 87.8  PLT 118*   Cardiac Enzymes: No results for input(s): CKTOTAL, CKMB, CKMBINDEX, TROPONINI in the last 168 hours.  BNP (last 3 results) No results for input(s): PROBNP in the last 8760 hours. CBG: No results for input(s): GLUCAP in the last 168 hours.  Urinalysis    Component Value Date/Time   COLORURINE YELLOW 03/09/2019 1438   APPEARANCEUR CLEAR 03/09/2019 1438   LABSPEC 1.015 03/09/2019 1438   PHURINE 6.0 03/09/2019 1438   GLUCOSEU NEGATIVE 03/09/2019 1438   HGBUR NEGATIVE 03/09/2019 1438   BILIRUBINUR NEGATIVE 03/09/2019 1438   [place] 03/09/2019 [place] 08/26/2018 2103   UROBILINOGEN 1.0 03/09/2019 1438   NITRITE NEGATIVE 03/09/2019 1438   LEUKOCYTESUR NEGATIVE 03/09/2019 1438      Radiographic Studies: DG Chest 2 View  Result Date: 02/26/2020 CLINICAL DATA:  Dyspnea and cough for 3 weeks EXAM: CHEST - 2 VIEW COMPARISON:  01/15/2020 chest radiograph. FINDINGS: Intact sternotomy wires. Stable cardiomediastinal silhouette with moderate cardiomegaly and moderate to large hiatal hernia. No pneumothorax. No pleural effusion. Cephalization of the pulmonary vasculature without overt pulmonary edema. Low lung volumes with medial left lung base atelectasis. IMPRESSION: 1. Low lung volumes with medial left lung base atelectasis. 2. Moderate cardiomegaly without overt pulmonary edema. 3. Moderate to large hiatal hernia. Electronically Signed   By: Ilona Sorrel M.D.   On: 02/26/2020 10:13    EKG: Independently  reviewed.  Poor baseline.  Atrial fibrillation at 70.  RBBB.  Occasional PVCs.  Diffusely flattened T waves.  Some T wave inversions V2, V3, V5 and V6.   Assessment/Plan:   Principal Problem:   Atrial fibrillation, new onset ([place]) Active Problems:   Essential hypertension   History of TIA (transient ischemic attack)   Peripheral edema   Parkinson's disease ([facility])   Elevated troponin   CAD (coronary artery disease) of artery bypass graft   Type 2 diabetes mellitus with complication, without long-term current use of insulin ([facility])   Dyspnea  Dyspnea Seen by Pulmonary who note patient's poor exercise tolerance and functional capacity is likely likely combination of restrictive lung disease/small lung volume and deconditioning However patient has had acute worsening over the past week, this may be directly attributable to new onset atrial fibrillation and or possibly have a component of decompensated heart failure associated with it. Echocardiogram ordered Cardiology consult placed for likely decompensated heart failure.  New diagnosis of Atrial fbirillation  Patient is already rate controlled, known history of bradycardia, likely has some conduction system abnormality. Heparin started. Cardiology consult placed.  New cognitive difficulties, speech problems, possible visual hallucinations Patient with new confusion and some visual hallucinations may be progression of Parkinson's disease however given presumed new onset of atrial fibrillation, he may also have had a stroke. Discussed with neurology, will order brain MRI Neurology  consult to follow  Elevated troponin Likely demand ischemia Continue aspirin, as needed nitroglycerin and lovastatin  Peripheral edema Patient with significant lower extremity edema extending all the way up to his buttocks If this is decompensated heart failure, its right-sided failure, echocardiogram pending. I have continue Lasix 20 mg twice daily however  this may need to be increased depending upon what his urine output is to the 20 mg.  I believe he is Lasix nave. CAD s/p CABG  H/o TIA Continue aspirin, Plavix and statin Lovastatin was changed to pravastatin however note that patient has been allergic to atorvastatin and  GERD Continue PPI    Other information:   DVT prophylaxis: IV heparin ordered. Code Status: Full Family Communication: Patient's wife is at bedside throughout Disposition Plan: Home Consults called: Cardiology, neurology Admission status: Inpatient  [facility]  If 7PM-7AM, please contact night-coverage [email or web address] Password [facility] 02/26/2020, 12:47 PM

## 2020-02-26 NOTE — Consult Note (Signed)
Cardiology Consultation:   Patient ID: Brandon Rojas MRN: [id number]; DOB: [birth year]  Admit date: 02/26/2020 Date of Consult: 02/26/2020  PCP:  Brandon Borg, MD   [place]  Cardiologist:  Brandon Breeding, MD  Advanced Practice Provider:  No care team member to display Electrophysiologist:  None    Patient Profile:   Brandon Rojas is a 75 y.o. male with a PMH of CAD Rojas/p CABG in 2006, mitral regurgitation, HTN, HLD, PD, CVA/TIA, depression, who is being seen today for the evaluation of CHF and atrial fibrillation at the request of Dr. Jamse Rojas.  History of Present Illness:   Brandon Rojas presented to the ED with complaints of progressive SOB and functional decline over the past several months. He was seen by pulmonology 2 weeks ago for progressive DOE which was felt to be 2/2 deconditioning and restrictive lung disease. Unfortunately his SOB has progressed further over the past week to the point that he gets winded transferring from his bedside chair to bed. He has also had worsening LE edema which would typically resolve overnight but now extends up to his thighs. He has associated weight gain of ~15 lbs over the past couple months. His wife reports increased confusion recently as well - trouble remembering names and occasionally picking at things in the air as if something is there, despite nothing being there. Given progressive decline, patient was brought to the ED for further evaluation.  He was last evaluated by cardiology at an outpatient visit with Dr. Percival Rojas 06/2019, at which time he was without cardiac complaints. No medication changes occurred and he was recommended to follow-up in 1 year. His last echocardiogram 08/2018 showed EF 60-65%, normal LV diastolic function, moderate biatrial enlargement, degenerative mitral valve with mild prolapse of anterior leaflet, mild MR, and moderate TR.   At the time of this evaluation patient reports breathing is stable  but no significant improvement from admission. He is currently A&O x2 (self and place) and seems a bit confused. He is unaware of his atrial fibrillation as he has not appreciated any palpitations. He denies chest pain.   Hospital course: hypertensive, initially mildly tachypneic, otherwise VSS. Labs notable for electrolytes wnl, Cr 1.05, Hgb 12.3, PLT 118, albumin 3.1, HsTrop 44>44, BNP 598. COVID-19/influenza negative. CXR with low lung volumes, moderate cardiomegaly without overt pulmonary edema, and moderate-large hiatal hernia. EKG showed atrial fibrillation with rate 68 bpm, RBBB, PVC, non-specific ST-T wave abnormalities (afib and RBBB new compared to previous). He was admitted to medicine and started on a heparin gtt for stroke ppx and given IV lasix 20mg  for suspected CHF. Echo has been completed - read pending. Cardiology asked to evaluate.   Past Medical History:  Diagnosis Date  . Anxiety state, unspecified   . Arthritis   . Benign neoplasm of colon   . CAD (coronary artery disease) of artery bypass graft 05/20/2018  . Depressive disorder, not elsewhere classified   . Diaphragmatic hernia without mention of obstruction or gangrene   . Diverticulosis of colon (without mention of hemorrhage)   . Esophageal reflux   . Esophageal stricture   . Hiatal hernia   . Mitral regurgitation   . Osteoarthrosis, unspecified whether generalized or localized, unspecified site   . Other and unspecified hyperlipidemia   . 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 Personal history of colonic polyps   . Stroke ([place])   . Unspecified essential hypertension     Past Surgical History:  Procedure Laterality  Date  . COLONOSCOPY    . CORONARY ARTERY BYPASS GRAFT     x 2  . INGUINAL HERNIA REPAIR     bilateral  . IR EXCHANGE BILIARY DRAIN  11/06/2018  . IR PERC CHOLECYSTOSTOMY  05/22/2018  . IR RADIOLOGIST EVAL & MGMT  06/24/2018  . IR RADIOLOGIST EVAL & MGMT  07/01/2018  . LUNG BIOPSY    . POLYPECTOMY    .  PTCA       Home Medications:  Prior to Admission medications   Medication Sig Start Date End Date Taking? Authorizing Provider  albuterol (VENTOLIN HFA) 108 (90 Base) MCG/ACT inhaler Inhale 1-2 puffs into the lungs every 6 (six) hours as needed for wheezing or shortness of breath. 02/11/20  Yes Brandon Gobble, MD  aspirin 81 MG tablet Take 81 mg by mouth daily.   Yes [provider]  carbidopa-levodopa (SINEMET IR) 25-100 MG tablet 2 at 7am, 1 at 11am, 2 at 3pm, 1 at 7pm 08/28/19  Yes Tat, Wells Guiles S, DO  clopidogrel (PLAVIX) 75 MG tablet TAKE 1 TABLET EVERY DAY WITH BREAKFAST 09/24/19  Yes Brandon Borg, MD  diphenhydrAMINE (BENADRYL) 50 MG tablet Take 1 tablet (50 mg total) by mouth at bedtime as needed for itching. 02/12/20  Yes Brandon Rojas, DPM  esomeprazole (NEXIUM) 40 MG capsule TAKE 1 CAPSULE BY MOUTH ONCE DAILY 03/13/19  Yes Brandon Borg, MD  lovastatin (MEVACOR) 20 MG tablet TAKE 1 TABLET AT BEDTIME 09/24/19  Yes Brandon Borg, MD  Multiple Vitamin (MULTIVITAMIN WITH MINERALS) TABS tablet Take 1 tablet by mouth daily.   Yes [provider]  nystatin-triamcinolone ointment (MYCOLOG) Apply 1 application topically 2 (two) times daily. 02/12/20  Yes Brandon Rojas, DPM  Polyethyl Glycol-Propyl Glycol (SYSTANE ULTRA) 0.4-0.3 % SOLN Apply to eye as directed.   Yes [provider]  tiZANidine (ZANAFLEX) 4 MG tablet Take 1 tablet (4 mg total) by mouth every 6 (six) hours as needed for muscle spasms. 09/10/19  Yes Brandon Borg, MD  traMADol (ULTRAM) 50 MG tablet TAKE 1 TABLET BY MOUTH EVERY 6 HOURS AS NEEDED FOR PAIN 09/11/19  Yes Brandon Borg, MD  B Complex Vitamins (VITAMIN B COMPLEX PO) Take 1 tablet by mouth as needed. Patient not taking: Reported on 02/26/2020    [provider]  diclofenac (VOLTAREN) 0.1 % ophthalmic solution  11/23/19   [provider]  doxycycline (VIBRA-TABS) 100 MG tablet Take 1 tablet (100 mg total) by mouth 2 (two) times daily.  01/26/20   Brandon Gobble, MD  DUREZOL 0.05 % EMUL  06/02/19   [provider]  ketoconazole (NIZORAL) 2 % cream Apply 1 application topically daily. 02/01/20   Brandon Borg, MD  nitroGLYCERIN (NITROSTAT) 0.4 MG SL tablet PLACE 1 TABLET (0.4 MG TOTAL) UNDER THE TONGUE EVERY 5 (FIVE) MINUTES AS NEEDED. 07/01/19   Brandon Breeding, MD  PROLENSA 0.07 % SOLN Place 1 drop into the left eye at bedtime. Patient not taking: Reported on 02/26/2020 06/02/19   [provider]  solifenacin (VESICARE) 5 MG tablet Take 1 tablet (5 mg total) by mouth daily. Patient not taking: Reported on 02/26/2020 03/11/19   Brandon Borg, MD    Inpatient Medications: Scheduled Meds: . [START ON 02/27/2020] aspirin EC  81 mg Oral Daily  . carbidopa-levodopa  2 tablet Oral TID  . [START ON 02/27/2020] clopidogrel  75 mg Oral Daily  . furosemide  20 mg Intravenous BID  .  LORazepam  1 mg Oral Once  . [START ON 02/27/2020] pantoprazole  80 mg Oral Q1200  . pravastatin  20 mg Oral q1800  . sodium chloride flush  3 mL Intravenous Q12H  . triamcinolone   Topical BID   Continuous Infusions: . sodium chloride    . heparin 900 Units/hr (02/26/20 1346)   PRN Meds: sodium chloride, acetaminophen **OR** acetaminophen, albuterol, nitroGLYCERIN, polyethylene glycol, sodium chloride flush  Allergies:    Allergies  Allergen Reactions  . Atorvastatin Other (See Comments)    Muscle cramps  . Simvastatin Other (See Comments)    Muscle cramps    Social History:   Social History   Socioeconomic History  . Marital status: Married    Spouse name: Not on file  . Number of children: 3  . Years of education: Not on file  . Highest education level: Doctorate  Occupational History  . 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 Smoking status: Former Smoker    Quit date: 02/19/1972    Years since quitting: 48.0  . Smokeless tobacco: Never Used  Vaping Use  . Vaping Use: Never used   Substance and Sexual Activity  . Alcohol use: No    Alcohol/week: 0.0 standard drinks  . Drug use: No  . Sexual activity: Not on file  Other Topics Concern  . Not on file  Social History Narrative  . Not on file   Social Determinants of Health   Financial Resource Strain: Low Risk   . Difficulty of Paying Living Expenses: Not hard at all  Food Insecurity: No Food Insecurity  . Worried About Charity fundraiser in the Last Year: Never true  . Ran Out of Food in the Last Year: Never true  Transportation Needs: No Transportation Needs  . Lack of Transportation (Medical): No  . Lack of Transportation (Non-Medical): No  Physical Activity: Inactive  . Days of Exercise per Week: 0 days  . Minutes of Exercise per Session: 0 min  Stress: No Stress Concern Present  . Feeling of Stress : Not at all  Social Connections: Socially Integrated  . Frequency of Communication with Friends and Family: More than three times a week  . Frequency of Social Gatherings with Friends and Family: More than three times a week  . Attends Religious Services: More than 4 times per year  . Active Member of Clubs or Organizations: Yes  . Attends Archivist Meetings: More than 4 times per year  . Marital Status: Married  Human resources officer Violence: Not At Risk  . Fear of Current or Ex-Partner: No  . Emotionally Abused: No  . Physically Abused: No  . Sexually Abused: No    Family History:    Family History  Problem Relation Age of Onset  . Heart attack Mother   . Stroke Father   . Colon cancer Sister   . Healthy Sister   . Heart disease Brother   . Healthy Brother   . Healthy Brother   . Healthy Brother   . Healthy Daughter   . Fibromyalgia Daughter   . Multiple sclerosis Son      ROS:  Please see the history of present illness.   All other ROS reviewed and negative.     Physical Exam/Data:   Vitals:   02/26/20 1245 02/26/20 1300 02/26/20 1330 02/26/20 1520  BP: (!) 154/119 (!)  159/103 (!) 166/98 (!) 163/82  Pulse: 62 (!) 57  66 65  Resp: 16 18 18    Temp:    97.8 F (36.6 C)  TempSrc:    Oral  SpO2: 93% 97% 94% 97%  Weight:      Height:       No intake or output data in the 24 hours ending 02/26/20 1710 Last 3 Weights 02/26/2020 02/11/2020 01/15/2020  Weight (lbs) 160 lb 160 lb 156 lb 12.8 oz  Weight (kg) 72.576 kg 72.576 kg 71.124 kg     Body mass index is 33.44 kg/m.  General:  Well nourished, well developed, appearing mildly SOB HEENT: sclera anicteric Neck: +JVD Vascular: No carotid bruits; distal pulses 2+ bilaterally  Cardiac:  normal S1, S2; IRIR; +murmur, no rubs, or gallops Lungs: increased work of breathing, crackles at lung bases Abd: soft, nontender, no hepatomegaly  Ext: 2-3+ LE edema Musculoskeletal:  No deformities, BUE and BLE strength normal and equal Skin: warm and dry  Neuro:  CNs 2-12 intact, no focal abnormalities noted Psych:  Normal affect   EKG:  The EKG was personally reviewed and demonstrates:  atrial fibrillation with rate 68 bpm, RBBB, PVC, non-specific ST-T wave abnormalities (afib and RBBB new compared to previous). Telemetry:  Telemetry was personally reviewed and demonstrates:  Atrial fibrillation with CVR/SVR (lows in the 40s), with occasional PVCs  Relevant CV Studies: Echocardiogram 08/2018: 1. The left ventricle has normal systolic function with an ejection  fraction of 60-65%. The cavity size was normal. Left ventricular diastolic  parameters were normal.  2. The right ventricle has normal systolic function. The cavity was  normal. There is no increase in right ventricular wall thickness.  3. Left atrial size was moderately dilated.  4. Right atrial size was moderately dilated.  5. The mitral valve is degenerative. Mild thickening of the mitral valve  leaflet. Mild calcification of the mitral valve leaflet.  6. Mild prolapse of anterior leaflet.  7. Tricuspid valve regurgitation is moderate.  8. The aortic  valve is tricuspid. Moderate thickening of the aortic  valve. Sclerosis without any evidence of stenosis of the aortic valve.  9. The aorta is normal unless otherwise noted.   Echocardiogram 02/26/20: 1. Left ventricular ejection fraction, by estimation, is 60 to 65%. The  left ventricle has normal function. The left ventricle has no regional  wall motion abnormalities. There is mild left ventricular hypertrophy.  Left ventricular diastolic parameters  are indeterminate. There is the interventricular septum is flattened in  systole and diastole, consistent with right ventricular pressure and  volume overload.  2. Right ventricular systolic function is moderately reduced. The right  ventricular size is moderately enlarged. There is normal pulmonary artery  systolic pressure. The estimated right ventricular systolic pressure is  65.6 mmHg.  3. Left atrial size was moderately dilated.  4. Right atrial size was severely dilated.  5. The mitral valve is normal in structure. Mild mitral valve  regurgitation. No evidence of mitral stenosis.  6. Tricuspid valve regurgitation is severe.  7. The aortic valve is grossly normal. Aortic valve regurgitation is not  visualized. Mild aortic valve sclerosis is present, with no evidence of  aortic valve stenosis.  8. Pulmonic valve regurgitation is moderate.  9. The inferior vena cava is dilated in size with <50% respiratory  variability, suggesting right atrial pressure of 15 mmHg.   Laboratory Data:  High Sensitivity Troponin:   Recent Labs  Lab 02/26/20 1003 02/26/20 1249  TROPONINIHS 44* 44*     Chemistry Recent Labs  Lab  02/26/20 1003  NA 138  K 4.0  CL 106  CO2 20*  GLUCOSE 121*  BUN 17  CREATININE 1.05  CALCIUM 9.2  GFRNONAA >60  ANIONGAP 12    Recent Labs  Lab 02/26/20 1003  PROT 6.4*  ALBUMIN 3.1*  AST 25  ALT 5  ALKPHOS 106  BILITOT 0.9   Hematology Recent Labs  Lab 02/26/20 1003  WBC 8.1  RBC 4.26   HGB 12.3*  HCT 37.4*  MCV 87.8  MCH 28.9  MCHC 32.9  RDW 14.3  PLT 118*   BNP Recent Labs  Lab 02/26/20 1014  BNP 598.7*    DDimer No results for input(Rojas): DDIMER in the last 168 hours.   Radiology/Studies:  DG Chest 2 View  Result Date: 02/26/2020 CLINICAL DATA:  Dyspnea and cough for 3 weeks EXAM: CHEST - 2 VIEW COMPARISON:  01/15/2020 chest radiograph. FINDINGS: Intact sternotomy wires. Stable cardiomediastinal silhouette with moderate cardiomegaly and moderate to large hiatal hernia. No pneumothorax. No pleural effusion. Cephalization of the pulmonary vasculature without overt pulmonary edema. Low lung volumes with medial left lung base atelectasis. IMPRESSION: 1. Low lung volumes with medial left lung base atelectasis. 2. Moderate cardiomegaly without overt pulmonary edema. 3. Moderate to large hiatal hernia. Electronically Signed   By: Ilona Sorrel M.D.   On: 02/26/2020 10:13   ECHOCARDIOGRAM COMPLETE  Result Date: 02/26/2020    ECHOCARDIOGRAM REPORT   Patient Name:   KEIONDRE COLEE Date of Exam: 02/26/2020 Medical Rec #:  [id number]     Height:       58.0 in Accession #:    [id number]    Weight:       160.0 lb Date of Birth:  01-19-1945      BSA:          1.656 m Patient Age:    58 years      BP:           163/82 mmHg Patient Gender: M             HR:           74 bpm. Exam Location:  Inpatient Procedure: 2D Echo, Cardiac Doppler and Color Doppler Indications:    Dyspnea R06.00  History:        Patient has prior history of Echocardiogram examinations, most                 recent 08/27/2018. CAD, Stroke; Risk Factors:Hypertension and                 Dyslipidemia. Parkinson'Rojas Disease. GERD.  Sonographer:    Tiffany Dance Referring Phys: [id number] [place]  1. Left ventricular ejection fraction, by estimation, is 60 to 65%. The left ventricle has normal function. The left ventricle has no regional wall motion abnormalities. There is mild left ventricular  hypertrophy. Left ventricular diastolic parameters are indeterminate. There is the interventricular septum is flattened in systole and diastole, consistent with right ventricular pressure and volume overload.  2. Right ventricular systolic function is moderately reduced. The right ventricular size is moderately enlarged. There is normal pulmonary artery systolic pressure. The estimated right ventricular systolic pressure is 93.7 mmHg.  3. Left atrial size was moderately dilated.  4. Right atrial size was severely dilated.  5. The mitral valve is normal in structure. Mild mitral valve regurgitation. No evidence of mitral stenosis.  6. Tricuspid valve regurgitation is severe.  7. The aortic valve is grossly normal. Aortic  valve regurgitation is not visualized. Mild aortic valve sclerosis is present, with no evidence of aortic valve stenosis.  8. Pulmonic valve regurgitation is moderate.  9. The inferior vena cava is dilated in size with <50% respiratory variability, suggesting right atrial pressure of 15 mmHg. FINDINGS  Left Ventricle: Left ventricular ejection fraction, by estimation, is 60 to 65%. The left ventricle has normal function. The left ventricle has no regional wall motion abnormalities. The left ventricular internal cavity size was normal in size. There is  mild left ventricular hypertrophy. The interventricular septum is flattened in systole and diastole, consistent with right ventricular pressure and volume overload. Left ventricular diastolic parameters are indeterminate. Right Ventricle: The right ventricular size is moderately enlarged. No increase in right ventricular wall thickness. Right ventricular systolic function is moderately reduced. There is normal pulmonary artery systolic pressure. The tricuspid regurgitant velocity is 2.08 m/Rojas, and with an assumed right atrial pressure of 15 mmHg, the estimated right ventricular systolic pressure is 27.2 mmHg. Left Atrium: Left atrial size was moderately  dilated. Right Atrium: Right atrial size was severely dilated. Pericardium: There is no evidence of pericardial effusion. Mitral Valve: The mitral valve is normal in structure. Mild mitral valve regurgitation. No evidence of mitral valve stenosis. Tricuspid Valve: The tricuspid valve is normal in structure. Tricuspid valve regurgitation is severe. No evidence of tricuspid stenosis. Aortic Valve: The aortic valve is grossly normal. Aortic valve regurgitation is not visualized. Mild aortic valve sclerosis is present, with no evidence of aortic valve stenosis. Pulmonic Valve: The pulmonic valve was normal in structure. Pulmonic valve regurgitation is moderate. No evidence of pulmonic stenosis. Aorta: The aortic root is normal in size and structure. Venous: The inferior vena cava is dilated in size with less than 50% respiratory variability, suggesting right atrial pressure of 15 mmHg. IAS/Shunts: No atrial level shunt detected by color flow Doppler.  LEFT VENTRICLE PLAX 2D LVIDd:         4.70 cm LVIDs:         3.60 cm LV PW:         1.40 cm LV IVS:        1.20 cm LVOT diam:     1.90 cm LV SV:         36 LV SV Index:   22 LVOT Area:     2.84 cm  RIGHT VENTRICLE          IVC RV Basal diam:  4.60 cm  IVC diam: 3.20 cm RV Mid diam:    4.10 cm TAPSE (M-mode): 1.8 cm LEFT ATRIUM             Index       RIGHT ATRIUM           Index LA diam:        5.40 cm 3.26 cm/m  RA Area:     44.70 cm LA Vol (A2C):   83.1 ml 50.17 ml/m RA Volume:   192.00 ml 115.92 ml/m LA Vol (A4C):   68.4 ml 41.30 ml/m LA Biplane Vol: 80.0 ml 48.30 ml/m  AORTIC VALVE LVOT Vmax:   70.30 cm/Rojas LVOT Vmean:  45.600 cm/Rojas LVOT VTI:    0.126 m  AORTA Ao Root diam: 2.90 cm Ao Asc diam:  3.50 cm MITRAL VALVE                TRICUSPID VALVE MV Area (PHT): 3.45 cm     TR Peak grad:   17.3 mmHg MV  Decel Time: 220 msec     TR Vmax:        208.00 cm/Rojas MV E velocity: 104.05 cm/Rojas                             SHUNTS                             Systemic VTI:  0.13 m                              Systemic Diam: 1.90 cm Candee Furbish MD Electronically signed by Candee Furbish MD Signature Date/Time: 02/26/2020/4:53:00 PM    Final      Assessment and Plan:   1. Acute right-sided CHF: patient presented with progressive DOE, LE edema, and weight gain. BNP elevated to 598. CXR with cardiomegaly but no overt edema. Echo this admission with EF 60-65%, no RWMA, mild LVH, indeterminate LV diastolic CHF, moderately reduced RV systolic function with  evidence of RV pressure and volume overload, mild MR, and severe TR. He is markedly volume overloaded on exam with increased WOB, +JVD, crackles at lung bases, and 2-3+ LE edema.  - Will increase lasix to 40mg  BID - Continue to monitor strict I&Os and daily weights - Continue to monitor electrolytes and replete as needed to maintain K>4, Mg >2  2. New onset atrial fibrillation: EKG on admission showed atrial fibrillation with CVR. He was started on heparin gtt for stroke ppx. He has CVR/SVR on telemetry so will avoid AV nodal blocking agents. Echo with EF 60-65%, indeterminate LV diastolic function, with moderate LAE and severe RAE, mild MR and severe TR. Suspect this could be driving #1. - [id number] Score = at least 6 [CHF History: No, HTN History: Yes, Diabetes History: No, Stroke History: Yes, Vascular Disease History: Yes, Age Score: 2, Gender Score: 0].  Therefore, the patient'Rojas annual risk of stroke is 9.7 %.    - Will stop heparin and transition to apixaban 5mg  BID for stroke ppx - Will stop plavix to minimize bleeding risk - May benefit from TEE/DCCV - will tentatively plan for Monday if he remains in atrial fibrillation.   3. CAD Rojas/p CABG in 2006: no complaints of chest pain. EKG non-ischemic. HsTrop with low flat trend not c/w ACS. Echo this admission with EF 60-65% with indeterminate LV diastolic function and no RWMA.  - Continue aspirin and statin  4. HTN: BP elevated this admission. Does not appear to be on  antihypertensives at home.  - Will start hydralazine 25mg  TID and monitor for response - hold parameters placed.   5. AMS: family notes increased confusion recently and possible visual hallucinations. Neurology consulted. Planning for MRI to r/o CVA.  - Continue management per primary team and neurology   Risk Assessment/Risk Scores:        New York Heart Association (NYHA) Functional Class NYHA Class III  CHA2DS2-VASc Score = 6  This indicates a 9.7% annual risk of stroke. The patient'Rojas score is based upon: CHF History: No HTN History: Yes Diabetes History: No Stroke History: Yes Vascular Disease History: Yes Age Score: 2 Gender Score: 0         For questions or updates, please contact [place] Please consult [email or web address] for contact info under    Signed, Abigail Butts, PA-C  02/26/2020 5:10 PM

## 2020-02-26 NOTE — ED Triage Notes (Signed)
Pt arrives to triage with wife who reports over the last 2-3 weeks he has had shortness of breath with cough he did follow up with pulmonary who gave him an inhaler. Per wife he has been wheezing more with cough and has been experiencing weakness.

## 2020-02-27 DIAGNOSIS — I5031 Acute diastolic (congestive) heart failure: Secondary | ICD-10-CM

## 2020-02-27 DIAGNOSIS — G20 Parkinson's disease: Secondary | ICD-10-CM | POA: Diagnosis not present

## 2020-02-27 DIAGNOSIS — R443 Hallucinations, unspecified: Secondary | ICD-10-CM | POA: Diagnosis not present

## 2020-02-27 DIAGNOSIS — I4891 Unspecified atrial fibrillation: Secondary | ICD-10-CM | POA: Diagnosis not present

## 2020-02-27 LAB — CBC
HCT: 36.4 % — ABNORMAL LOW (ref 39.0–52.0)
Hemoglobin: 12.3 g/dL — ABNORMAL LOW (ref 13.0–17.0)
MCH: 29.1 pg (ref 26.0–34.0)
MCHC: 33.8 g/dL (ref 30.0–36.0)
MCV: 86.1 fL (ref 80.0–100.0)
Platelets: 134 10*3/uL — ABNORMAL LOW (ref 150–400)
RBC: 4.23 MIL/uL (ref 4.22–5.81)
RDW: 14.3 % (ref 11.5–15.5)
WBC: 6.6 10*3/uL (ref 4.0–10.5)
nRBC: 0 % (ref 0.0–0.2)

## 2020-02-27 LAB — BASIC METABOLIC PANEL
Anion gap: 9 (ref 5–15)
BUN: 15 mg/dL (ref 8–23)
CO2: 23 mmol/L (ref 22–32)
Calcium: 9.2 mg/dL (ref 8.9–10.3)
Chloride: 107 mmol/L (ref 98–111)
Creatinine, Ser: 1.08 mg/dL (ref 0.61–1.24)
GFR, Estimated: >60 mL/min (ref >60)
Glucose, Bld: 97 mg/dL (ref 70–99)
Potassium: 3.8 mmol/L (ref 3.5–5.1)
Sodium: 139 mmol/L (ref 135–145)

## 2020-02-27 LAB — PROTIME-INR
INR: 1.1 (ref 0.8–1.2)
Prothrombin Time: 14.2 seconds (ref 11.4–15.2)

## 2020-02-27 MED ORDER — NYSTATIN 100000 UNIT/ML MT SUSP
5.0000 mL | Freq: Four times a day (QID) | OROMUCOSAL | Status: DC
  Administered 2020-02-27 – 2020-03-03 (×18): 500000 [IU] via ORAL
  Filled 2020-02-27 (×18): qty 5

## 2020-02-27 MED ORDER — SODIUM CHLORIDE 0.9 % IV SOLN: INTRAVENOUS | Status: DC

## 2020-02-27 NOTE — Discharge Instructions (Signed)

## 2020-02-27 NOTE — Progress Notes (Signed)
DAILY PROGRESS NOTE   Patient Name: Brandon Rojas Date of Encounter: 02/27/2020 Cardiologist: Minus Breeding, MD  Chief Complaint   No complaints  Patient Profile   Brandon Rojas is a 75 y.o. male with a PMH of CAD s/p CABG in 2006, mitral regurgitation, HTN, HLD, PD, CVA/TIA, depression, who is being seen today for the evaluation of CHF and atrial fibrillation at the request of Dr. Jamse Arn.  Subjective   Net negative 1.2L - creatinine stable. BNP was 598. Appears to be in rate-controlled afib. Feels better today. Updated family at bedside.  Objective   Vitals:   02/26/20 2152 02/26/20 2328 02/26/20 2345 02/27/20 0331  BP: 131/89 129/84  (!) 168/83  Pulse: 61 (!) 55  60  Resp: 14 18  17   Temp: 97.8 F (36.6 C)  (!) 97.4 F (36.3 C) (!) 97.5 F (36.4 C)  TempSrc: Oral  Axillary Axillary  SpO2: 95% 94%  96%  Weight:    73.2 kg  Height:        Intake/Output Summary (Last 24 hours) at 02/27/2020 1221 Last data filed at 02/26/2020 2157 Gross per 24 hour  Intake --  Output 1200 ml  Net -1200 ml   Filed Weights   02/26/20 1200 02/26/20 1520 02/27/20 0331  Weight: 72.6 kg 74.8 kg 73.2 kg    Physical Exam   General appearance: alert and no distress Neck: JVD - several cm above sternal notch, supple, symmetrical, trachea midline and thyroid not enlarged, symmetric, no tenderness/mass/nodules Lungs: diminished breath sounds bibasilar Heart: irregularly irregular rhythm Abdomen: soft, non-tender; bowel sounds normal; no masses,  no organomegaly Extremities: edema 2+ ede,a Pulses: 2+ and symmetric Skin: pale, warm, dry Neurologic: Mental status: Less confused, PD features PSych: Pleasant  Inpatient Medications    Scheduled Meds: . apixaban  5 mg Oral BID  . aspirin EC  81 mg Oral Daily  . carbidopa-levodopa  2 tablet Oral TID  . furosemide  40 mg Intravenous BID  . hydrALAZINE  25 mg Oral Q8H  . nystatin  5 mL Oral QID  . pantoprazole  80 mg Oral Q1200  .  pravastatin  20 mg Oral q1800  . sodium chloride flush  3 mL Intravenous Q12H  . triamcinolone   Topical BID    Continuous Infusions: . sodium chloride    . sodium chloride      PRN Meds: sodium chloride, acetaminophen **OR** acetaminophen, albuterol, nitroGLYCERIN, polyethylene glycol, sodium chloride flush   Labs   Results for orders placed or performed during the hospital encounter of 02/26/20 (from the past 48 hour(s))  Basic metabolic panel     Status: Abnormal   Collection Time: 02/26/20 10:03 AM  Result Value Ref Range   Sodium 138 135 - 145 mmol/L   Potassium 4.0 3.5 - 5.1 mmol/L   Chloride 106 98 - 111 mmol/L   CO2 20 (L) 22 - 32 mmol/L   Glucose, Bld 121 (H) 70 - 99 mg/dL    Comment: Glucose reference range applies only to samples taken after fasting for at least 8 hours.   BUN 17 8 - 23 mg/dL   Creatinine, Ser 1.05 0.61 - 1.24 mg/dL   Calcium 9.2 8.9 - 10.3 mg/dL   GFR, Estimated >60 >60 mL/min    Comment: (NOTE) Calculated using the CKD-EPI Creatinine Equation (2021)    Anion gap 12 5 - 15    Comment: Performed at [facility] [address] [place], [place]  [address]  CBC     Status: Abnormal   Collection Time: 02/26/20 10:03 AM  Result Value Ref Range   WBC 8.1 4.0 - 10.5 K/uL   RBC 4.26 4.22 - 5.81 MIL/uL   Hemoglobin 12.3 (L) 13.0 - 17.0 g/dL   HCT 37.4 (L) 39.0 - 52.0 %   MCV 87.8 80.0 - 100.0 fL   MCH 28.9 26.0 - 34.0 pg   MCHC 32.9 30.0 - 36.0 g/dL   RDW 14.3 11.5 - 15.5 %   Platelets 118 (L) 150 - 400 K/uL    Comment: REPEATED TO VERIFY PLATELET COUNT CONFIRMED BY SMEAR    nRBC 0.0 0.0 - 0.2 %    Comment: Performed at [facility], [place] [address], [place], [place] [address]  Troponin I (High Sensitivity)     Status: Abnormal   Collection Time: 02/26/20 10:03 AM  Result Value Ref Range   Troponin I (High Sensitivity) 44 (H) <18 ng/L    Comment: (NOTE) Elevated high sensitivity troponin I (hsTnI) values and significant  changes  across serial measurements may suggest ACS but many other  chronic and acute conditions are known to elevate hsTnI results.  Refer to the "Links" section for chest pain algorithms and additional  guidance. Performed at [facility], [place] [address], [place], [place] [address]   Hepatic function panel     Status: Abnormal   Collection Time: 02/26/20 10:03 AM  Result Value Ref Range   Total Protein 6.4 (L) 6.5 - 8.1 g/dL   Albumin 3.1 (L) 3.5 - 5.0 g/dL   AST 25 15 - 41 U/L   ALT 5 0 - 44 U/L   Alkaline Phosphatase 106 38 - 126 U/L   Total Bilirubin 0.9 0.3 - 1.2 mg/dL   Bilirubin, Direct 0.3 (H) 0.0 - 0.2 mg/dL   Indirect Bilirubin 0.6 0.3 - 0.9 mg/dL    Comment: Performed at [facility] [address] [place], [place] [address]  Brain natriuretic peptide     Status: Abnormal   Collection Time: 02/26/20 10:14 AM  Result Value Ref Range   B Natriuretic Peptide 598.7 (H) 0.0 - 100.0 pg/mL    Comment: Performed at [facility] [address] [place], [place] [address]  Resp Panel by RT-PCR (Flu A&B, Covid) Nasopharyngeal Swab     Status: None   Collection Time: 02/26/20 10:55 AM   Specimen: Nasopharyngeal Swab; Nasopharyngeal(NP) swabs in vial transport medium  Result Value Ref Range   SARS Coronavirus 2 by RT PCR NEGATIVE NEGATIVE    Comment: (NOTE) SARS-CoV-2 target nucleic acids are NOT DETECTED.  The SARS-CoV-2 RNA is generally detectable in upper respiratory specimens during the acute phase of infection. The lowest concentration of SARS-CoV-2 viral copies this assay can detect is 138 copies/mL. A negative result does not preclude SARS-Cov-2 infection and should not be used as the sole basis for treatment or other patient management decisions. A negative result may occur with  improper specimen collection/handling, submission of specimen other than nasopharyngeal swab, presence of viral mutation(s) within the areas targeted by this assay, and inadequate  number of viral copies(<138 copies/mL). A negative result must be combined with clinical observations, patient history, and epidemiological information. The expected result is Negative.  Fact Sheet for Patients:  EntrepreneurPulse.com.au  Fact Sheet for Healthcare Providers:  IncredibleEmployment.be  This test is no t yet approved or cleared by the [place] and  has been authorized for  detection and/or diagnosis of SARS-CoV-2 by FDA under an Emergency Use Authorization (EUA). This EUA will remain  in effect (meaning this test can be used) for the duration of the COVID-19 declaration under Section 564(b)(1) of the Act, 21 U.S.C.section 360bbb-3(b)(1), unless the authorization is terminated  or revoked sooner.       Influenza A by PCR NEGATIVE NEGATIVE   Influenza B by PCR NEGATIVE NEGATIVE    Comment: (NOTE) The Xpert Xpress SARS-CoV-2/FLU/RSV plus assay is intended as an aid in the diagnosis of influenza from Nasopharyngeal swab specimens and should not be used as a sole basis for treatment. Nasal washings and aspirates are unacceptable for Xpert Xpress SARS-CoV-2/FLU/RSV testing.  Fact Sheet for Patients: EntrepreneurPulse.com.au  Fact Sheet for Healthcare Providers: IncredibleEmployment.be  This test is not yet approved or cleared by the [place] FDA and has been authorized for detection and/or diagnosis of SARS-CoV-2 by FDA under an Emergency Use Authorization (EUA). This EUA will remain in effect (meaning this test can be used) for the duration of the COVID-19 declaration under Section 564(b)(1) of the Act, 21 U.S.C. section 360bbb-3(b)(1), unless the authorization is terminated or revoked.  Performed at [facility], [place] [address], [place], [place] [address]   Troponin I (High Sensitivity)     Status: Abnormal   Collection Time: 02/26/20 12:49 PM  Result Value Ref Range    Troponin I (High Sensitivity) 44 (H) <18 ng/L    Comment: (NOTE) Elevated high sensitivity troponin I (hsTnI) values and significant  changes across serial measurements may suggest ACS but many other  chronic and acute conditions are known to elevate hsTnI results.  Refer to the "Links" section for chest pain algorithms and additional  guidance. Performed at [facility], [place] [address], [place], [place] [address]   TSH     Status: None   Collection Time: 02/26/20  4:09 PM  Result Value Ref Range   TSH 4.470 0.350 - 4.500 uIU/mL    Comment: Performed by a 3rd Generation assay with a functional sensitivity of <=0.01 uIU/mL. Performed at [facility], [place] [address], [place], [place] [address]   Vitamin B12     Status: Abnormal   Collection Time: 02/26/20  6:21 PM  Result Value Ref Range   Vitamin B-12 1,992 (H) 180 - 914 pg/mL    Comment: (NOTE) This assay is not validated for testing neonatal or myeloproliferative syndrome specimens for Vitamin B12 levels. Performed at [facility], [place] [address] [place], [place] [address]   Hemoglobin A1c     Status: Abnormal   Collection Time: 02/26/20  6:21 PM  Result Value Ref Range   Hgb A1c MFr Bld 6.2 (H) 4.8 - 5.6 %    Comment: (NOTE) Pre diabetes:          5.7%-6.4%  Diabetes:              >6.4%  Glycemic control for   <7.0% adults with diabetes    Mean Plasma Glucose 131.24 mg/dL    Comment: Performed at [facility] [address], [place], [place] [address]  Basic metabolic panel     Status: None   Collection Time: 02/27/20  3:56 AM  Result Value Ref Range   Sodium 139 135 - 145 mmol/L   Potassium 3.8 3.5 - 5.1 mmol/L   Chloride 107 98 - 111 mmol/L   CO2 23 22 - 32 mmol/L   Glucose, Bld  97 70 - 99 mg/dL    Comment: Glucose reference range applies only to samples taken after fasting for at least 8 hours.   BUN 15 8 - 23 mg/dL   Creatinine, Ser 1.08 0.61 - 1.24 mg/dL   Calcium 9.2 8.9 - 10.3  mg/dL   GFR, Estimated >60 >60 mL/min    Comment: (NOTE) Calculated using the CKD-EPI Creatinine Equation (2021)    Anion gap 9 5 - 15    Comment: Performed at [facility] [address] [place], [place] [address]  CBC     Status: Abnormal   Collection Time: 02/27/20  3:56 AM  Result Value Ref Range   WBC 6.6 4.0 - 10.5 K/uL   RBC 4.23 4.22 - 5.81 MIL/uL   Hemoglobin 12.3 (L) 13.0 - 17.0 g/dL   HCT 36.4 (L) 39.0 - 52.0 %   MCV 86.1 80.0 - 100.0 fL   MCH 29.1 26.0 - 34.0 pg   MCHC 33.8 30.0 - 36.0 g/dL   RDW 14.3 11.5 - 15.5 %   Platelets 134 (L) 150 - 400 K/uL    Comment: REPEATED TO VERIFY   nRBC 0.0 0.0 - 0.2 %    Comment: Performed at [facility], [place] [address] [place], [place] [address]  Protime-INR     Status: None   Collection Time: 02/27/20  4:04 AM  Result Value Ref Range   Prothrombin Time 14.2 11.4 - 15.2 seconds   INR 1.1 0.8 - 1.2    Comment: (NOTE) INR goal varies based on device and disease states. Performed at [facility], [place] [address], [place], [place] [address]     ECG   N /A  Telemetry   afib with CVR - Personally Reviewed  Radiology    DG Chest 2 View  Result Date: 02/26/2020 CLINICAL DATA:  Dyspnea and cough for 3 weeks EXAM: CHEST - 2 VIEW COMPARISON:  01/15/2020 chest radiograph. FINDINGS: Intact sternotomy wires. Stable cardiomediastinal silhouette with moderate cardiomegaly and moderate to large hiatal hernia. No pneumothorax. No pleural effusion. Cephalization of the pulmonary vasculature without overt pulmonary edema. Low lung volumes with medial left lung base atelectasis. IMPRESSION: 1. Low lung volumes with medial left lung base atelectasis. 2. Moderate cardiomegaly without overt pulmonary edema. 3. Moderate to large hiatal hernia. Electronically Signed   By: Ilona Sorrel M.D.   On: 02/26/2020 10:13   MR BRAIN WO CONTRAST  Result Date: 02/26/2020 CLINICAL DATA:  Transient ischemic attack. Shortness of breath  cough. EXAM: MRI HEAD WITHOUT CONTRAST TECHNIQUE: Multiplanar, multiecho pulse sequences of the brain and surrounding structures were obtained without intravenous contrast. COMPARISON:  CT exam 05/14/2019. FINDINGS: Brain: The study suffers from motion degradation and is abbreviated due to lack of patient tolerance. Diffusion imaging is of good quality and does not show any acute or subacute infarction. No focal abnormality affects the brainstem or cerebellum. Old small vessel infarctions in thalami. Moderate chronic small-vessel ischemic changes of the cerebral hemispheric white matter. No mass lesion, hemorrhage, hydrocephalus or extra-axial collection. Vascular: Major vessels at the base of the brain show flow. Skull and upper cervical spine: Negative Sinuses/Orbits: Clear/normal Other: None IMPRESSION: Abbreviated exam because of motion degradation and lack of patient tolerance. No acute or reversible finding. Moderate chronic small-vessel ischemic changes affecting the brain as outlined above. Electronically Signed   By: Nelson Chimes M.D.   On: 02/26/2020 21:26   ECHOCARDIOGRAM COMPLETE  Result Date: 02/26/2020  ECHOCARDIOGRAM REPORT   Patient Name:   Brandon Rojas Date of Exam: 02/26/2020 Medical Rec #:  [id number]     Height:       58.0 in Accession #:    [id number]    Weight:       160.0 lb Date of Birth:  [birth year]      BSA:          1.656 m Patient Age:    37 years      BP:           163/82 mmHg Patient Gender: M             HR:           74 bpm. Exam Location:  Inpatient Procedure: 2D Echo, Cardiac Doppler and Color Doppler Indications:    Dyspnea R06.00  History:        Patient has prior history of Echocardiogram examinations, most                 recent 08/27/2018. CAD, Stroke; Risk Factors:Hypertension and                 Dyslipidemia. Parkinson's Disease. GERD.  Sonographer:    Tiffany Dance Referring Phys: [id number] [place]  1. Left ventricular ejection fraction, by  estimation, is 60 to 65%. The left ventricle has normal function. The left ventricle has no regional wall motion abnormalities. There is mild left ventricular hypertrophy. Left ventricular diastolic parameters are indeterminate. There is the interventricular septum is flattened in systole and diastole, consistent with right ventricular pressure and volume overload.  2. Right ventricular systolic function is moderately reduced. The right ventricular size is moderately enlarged. There is normal pulmonary artery systolic pressure. The estimated right ventricular systolic pressure is 03.5 mmHg.  3. Left atrial size was moderately dilated.  4. Right atrial size was severely dilated.  5. The mitral valve is normal in structure. Mild mitral valve regurgitation. No evidence of mitral stenosis.  6. Tricuspid valve regurgitation is severe.  7. The aortic valve is grossly normal. Aortic valve regurgitation is not visualized. Mild aortic valve sclerosis is present, with no evidence of aortic valve stenosis.  8. Pulmonic valve regurgitation is moderate.  9. The inferior vena cava is dilated in size with <50% respiratory variability, suggesting right atrial pressure of 15 mmHg. FINDINGS  Left Ventricle: Left ventricular ejection fraction, by estimation, is 60 to 65%. The left ventricle has normal function. The left ventricle has no regional wall motion abnormalities. The left ventricular internal cavity size was normal in size. There is  mild left ventricular hypertrophy. The interventricular septum is flattened in systole and diastole, consistent with right ventricular pressure and volume overload. Left ventricular diastolic parameters are indeterminate. Right Ventricle: The right ventricular size is moderately enlarged. No increase in right ventricular wall thickness. Right ventricular systolic function is moderately reduced. There is normal pulmonary artery systolic pressure. The tricuspid regurgitant velocity is 2.08 m/s, and  with an assumed right atrial pressure of 15 mmHg, the estimated right ventricular systolic pressure is 00.9 mmHg. Left Atrium: Left atrial size was moderately dilated. Right Atrium: Right atrial size was severely dilated. Pericardium: There is no evidence of pericardial effusion. Mitral Valve: The mitral valve is normal in structure. Mild mitral valve regurgitation. No evidence of mitral valve stenosis. Tricuspid Valve: The tricuspid valve is normal in structure. Tricuspid valve regurgitation is severe. No evidence of tricuspid stenosis. Aortic Valve: The aortic valve is grossly  normal. Aortic valve regurgitation is not visualized. Mild aortic valve sclerosis is present, with no evidence of aortic valve stenosis. Pulmonic Valve: The pulmonic valve was normal in structure. Pulmonic valve regurgitation is moderate. No evidence of pulmonic stenosis. Aorta: The aortic root is normal in size and structure. Venous: The inferior vena cava is dilated in size with less than 50% respiratory variability, suggesting right atrial pressure of 15 mmHg. IAS/Shunts: No atrial level shunt detected by color flow Doppler.  LEFT VENTRICLE PLAX 2D LVIDd:         4.70 cm LVIDs:         3.60 cm LV PW:         1.40 cm LV IVS:        1.20 cm LVOT diam:     1.90 cm LV SV:         36 LV SV Index:   22 LVOT Area:     2.84 cm  RIGHT VENTRICLE          IVC RV Basal diam:  4.60 cm  IVC diam: 3.20 cm RV Mid diam:    4.10 cm TAPSE (M-mode): 1.8 cm LEFT ATRIUM             Index       RIGHT ATRIUM           Index LA diam:        5.40 cm 3.26 cm/m  RA Area:     44.70 cm LA Vol (A2C):   83.1 ml 50.17 ml/m RA Volume:   192.00 ml 115.92 ml/m LA Vol (A4C):   68.4 ml 41.30 ml/m LA Biplane Vol: 80.0 ml 48.30 ml/m  AORTIC VALVE LVOT Vmax:   70.30 cm/s LVOT Vmean:  45.600 cm/s LVOT VTI:    0.126 m  AORTA Ao Root diam: 2.90 cm Ao Asc diam:  3.50 cm MITRAL VALVE                TRICUSPID VALVE MV Area (PHT): 3.45 cm     TR Peak grad:   17.3 mmHg MV Decel  Time: 220 msec     TR Vmax:        208.00 cm/s MV E velocity: 104.05 cm/s                             SHUNTS                             Systemic VTI:  0.13 m                             Systemic Diam: 1.90 cm Candee Furbish MD Electronically signed by Candee Furbish MD Signature Date/Time: 02/26/2020/4:53:00 PM    Final     Cardiac Studies   See above  Assessment   1. Principal Problem: 2.   Atrial fibrillation, new onset ([place]) 3. Active Problems: 4.   Essential hypertension 5.   History of TIA (transient ischemic attack) 6.   Peripheral edema 7.   Parkinson's disease ([place]) 8.   Elevated troponin 9.   CAD (coronary artery disease) of artery bypass graft 10.   Type 2 diabetes mellitus with complication, without long-term current use of insulin ([place]) 11.   Dyspnea 12.   Plan   1. Continue lasix - good output. Possible TEE/DCCV next week.  On Eliquis.  Time Spent Directly with Patient:  I have spent a total of 25 minutes with the patient reviewing hospital notes, telemetry, EKGs, labs and examining the patient as well as establishing an assessment and plan that was discussed personally with the patient.  > 50% of time was spent in direct patient care.  Length of Stay:  LOS: 1 day   Pixie Casino, MD, [facility], [place] Director of the Advanced Lipid Disorders &  Cardiovascular Risk Reduction Clinic Diplomate of the American Board of Clinical Lipidology Attending Cardiologist  Direct Dial: [phone]  Fax: [phone]  Website:  [email or web address] 02/27/2020, 12:21 PM

## 2020-02-27 NOTE — Progress Notes (Signed)
Neurology Progress Note  CC: Parkinson's with visual hallucination.   Interval History: Brandon Rojas is a 75 y.o. male who is admitted with a variety of issues including new onset AFIB and HFrEF, visual hallucinations and Parkinson's disease. We were consulted primarily for the VH/encephalopathy, and recommended avoidance of confounding drugs, and treatment of CHF. Overnight he appears to have improved. No complaints this AM other than back pain and sleepiness (I woke him for the exam).  ROS: A 14 point ROS was performed and is negative except as noted in the HPI.   Past Medical History:  Diagnosis Date  . Anxiety state, unspecified   . Arthritis   . Benign neoplasm of colon   . CAD (coronary artery disease) of artery bypass graft 05/20/2018  . Depressive disorder, not elsewhere classified   . Diaphragmatic hernia without mention of obstruction or gangrene   . Diverticulosis of colon (without mention of hemorrhage)   . Esophageal reflux   . Esophageal stricture   . Hiatal hernia   . Mitral regurgitation   . Osteoarthrosis, unspecified whether generalized or localized, unspecified site   . Other and unspecified hyperlipidemia   . Parkinson's disease ([place])   . Personal history of colonic polyps   . Stroke ([place])   . Unspecified essential hypertension     Family History  Problem Relation Age of Onset  . Heart attack Mother   . Stroke Father   . Colon cancer Sister   . Healthy Sister   . Heart disease Brother   . Healthy Brother   . Healthy Brother   . Healthy Brother   . Healthy Daughter   . Fibromyalgia Daughter   . Multiple sclerosis Son     Social History:  reports that he quit smoking about 48 years ago. He has never used smokeless tobacco. He reports that he does not drink alcohol and does not use drugs.  Exam: Current vital signs: BP (!) 168/83 (BP Location: Right Arm)   Pulse 60   Temp (!) 97.5 F (36.4 C) (Axillary)   Resp 17   Ht 5\' 4"  (1.626 m) Comment: pt  stated he is 5'4  Wt 73.2 kg   SpO2 96%   BMI 27.70 kg/m  Vital signs in last 24 hours: Temp:  [97.4 F (36.3 C)-98.3 F (36.8 C)] 97.5 F (36.4 C) (02/19 0331) Pulse Rate:  [55-73] 60 (02/19 0331) Resp:  [14-24] 17 (02/19 0331) BP: (129-193)/(82-119) 168/83 (02/19 0331) SpO2:  [93 %-98 %] 96 % (02/19 0331) Weight:  [72.6 kg-74.8 kg] 73.2 kg (02/19 0331)   Physical Exam  Constitutional: Appears well-developed and well-nourished.  Psych: Affect appropriate to situation Eyes: No scleral injection HENT: No OP obstrucion MSK: no joint deformities.  Cardiovascular: Normal rate and regular rhythm.  Respiratory: Effort normal, non-labored breathing GI: Soft.  No distension. There is no tenderness.  Skin: WDI  Neuro: Mental Status: Patient is awake, alert, oriented to person, place, month, year, and situation. He was able to give month, year, location, today (unlike yesterday). Cranial Nerves: II: Pupils are equal, round, and reactive to light.   III,IV, VI: EOMI without ptosis or diplopia.  V: Facial sensation is symmetric to temperature VII: Facial movement is symmetric.  VIII: hearing is intact to voice X: deferred XI: deferred XII: deferred  Motor: Tone is increased mildly with subtle bilateral cogwheeling. No rest tremor noted during sleep or immediately upon awakening. Bulk is normal. 5/5 strength was present in all four extremities. Sensory:  Sensation is symmetric to light touch and temperature in the arms. Deep Tendon Reflexes: deferred Gait: deferred  Labs: I have reviewed labs in epic and the results pertinent to this consultation are: BMP OK with creatinine 1.08  Imaging: I have reviewed the images obtained: brain MRI suboptimal, but there is no indication of acute stroke. Chronic, small, bilateral thalamic infarcts noted on T2, primarily.  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  Recommendations: - Continue carbidopa/levodopa 25/100 2 tabs TID (same dose that he gets at home, just TID instead of QID) - He denies taking benadryl at home to help him sleep, so perhaps this was not a provoking factor; if insomnia returns while off benadryl, I'd suggest melatonin 5 mg at 9pm each night -  Could consider adding low-dose exelon (rivastigmine) patch 4.6 mg/d, as sometimes this will lessen the tendency for visual hallucinations or psychosis in PDD. This could be delayed and considered by his primary neurologist as an outpatient. - I agree with the decision to fully anticoagulate Brandon Rojas for the AFIB/flutter (apixaban). He has not had any falls, and there are no areas of acute infarction apparent on brain Mri to delay initiation of anticoagulation. Further, his old strokes provide additional support for the decision to proceed with full anticoagulation.  Neurology team is available should further questions arise during his stay.   [name] R. Charlsie Merles, MD

## 2020-02-27 NOTE — Progress Notes (Addendum)
PROGRESS NOTE    Brandon Rojas  DXI:[id number] DOB: [birth year] DOA: 02/26/2020 PCP: Biagio Borg, MD   Brief Narrative: 75 year old with past medical history significant for Parkinson disease, CAD status post CABG, history of TIA who presents with progressive dyspnea and functional decline over several months.  Over the last 2 weeks patient has been having worsening shortness of breath.  He report weight gain from 148 pounds a couple of months ago now weight 160 pounds.  Patient also has been noted confused, cognition has declined, reported some patient hallucination.  Patient was admitted with new onset A. fib, acute diastolic heart failure exacerbation, severe tricuspid valve rehabilitation.    Assessment & Plan:   Principal Problem:   Atrial fibrillation, new onset ([place]) Active Problems:   Essential hypertension   History of TIA (transient ischemic attack)   Peripheral edema   Parkinson's disease (HCC)   Elevated troponin   CAD (coronary artery disease) of artery bypass graft   Type 2 diabetes mellitus with complication, without long-term current use of insulin (HCC)   Dyspnea   1-new onset A. Fib: -Patient started on Eliquis.  -Rate controlled.  -Plan for cardioversion on Monday  -Cardiology following.   2-Acute diastolic Heart failure, Severe tricuspid Valve regurgitation;  Continue with IV laix 40 mg IV BID>  Weight 165---160.  Strict I and O.   3-acute metabolic encephalopathy, cognitive difficulties, visual hallucination: -Appreciate  neurology evaluation. -Likely related to acute illness, heart failure. -MRI negative for stroke  4-elevation of troponin: Likely demand ischemia  5-Parkinson disease, associated with dementia.  continue with sinemet.   6-history of TIA: Continue with aspirin Plavix and statins. 7-Oral thrush; started nystatin.  Estimated body mass index is 27.7 kg/m as calculated from the following:   Height as of this encounter: 5\' 4"  (1.626  m).   Weight as of this encounter: 73.2 kg.   DVT prophylaxis:  eliquis Code Status: Full code Family Communication: care discussed with wife who was at bedside.  Disposition Plan:  Status is: Inpatient  Remains inpatient appropriate because:IV treatments appropriate due to intensity of illness or inability to take PO   Dispo: The patient is from: Home              Anticipated d/c is to: Home              Anticipated d/c date is: 3 days              Patient currently is not medically stable to d/c.   Difficult to place patient No        Consultants:   Cardiology  Neurology   Procedures:     Antimicrobials:    Subjective: He is alert conversant. Per wife his MS has improved.  He denies worsening dyspnea.    Objective: Vitals:   02/26/20 2152 02/26/20 2328 02/26/20 2345 02/27/20 0331  BP: 131/89 129/84  (!) 168/83  Pulse: 61 (!) 55  60  Resp: 14 18  17   Temp: 97.8 F (36.6 C)  (!) 97.4 F (36.3 C) (!) 97.5 F (36.4 C)  TempSrc: Oral  Axillary Axillary  SpO2: 95% 94%  96%  Weight:    73.2 kg  Height:        Intake/Output Summary (Last 24 hours) at 02/27/2020 0929 Last data filed at 02/26/2020 2157 Gross per 24 hour  Intake --  Output 1200 ml  Net -1200 ml   Filed Weights   02/26/20 1200 02/26/20 1520  02/27/20 0331  Weight: 72.6 kg 74.8 kg 73.2 kg    Examination:  General exam: Appears calm and comfortable  Respiratory system: BL crackles.  Cardiovascular system: S1 & S2 heard, IRR Gastrointestinal system: Abdomen is nondistended, soft and nontender. No organomegaly or masses felt. Normal bowel sounds heard. Central nervous system: Alert  Extremities: Symmetric 5 x 5 power. Plus 2 edema   Data Reviewed: I have personally reviewed following labs and imaging studies  CBC: Recent Labs  Lab 02/26/20 1003 02/27/20 0356  WBC 8.1 6.6  HGB 12.3* 12.3*  HCT 37.4* 36.4*  MCV 87.8 86.1  PLT 118* 762*   Basic Metabolic Panel: Recent Labs   Lab 02/26/20 1003 02/27/20 0356  NA 138 139  K 4.0 3.8  CL 106 107  CO2 20* 23  GLUCOSE 121* 97  BUN 17 15  CREATININE 1.05 1.08  CALCIUM 9.2 9.2   GFR: Estimated Creatinine Clearance: 54.2 mL/min (by C-G formula based on SCr of 1.08 mg/dL). Liver Function Tests: Recent Labs  Lab 02/26/20 1003  AST 25  ALT 5  ALKPHOS 106  BILITOT 0.9  PROT 6.4*  ALBUMIN 3.1*   No results for input(s): LIPASE, AMYLASE in the last 168 hours. No results for input(s): AMMONIA in the last 168 hours. Coagulation Profile: Recent Labs  Lab 02/27/20 0404  INR 1.1   Cardiac Enzymes: No results for input(s): CKTOTAL, CKMB, CKMBINDEX, TROPONINI in the last 168 hours. BNP (last 3 results) No results for input(s): PROBNP in the last 8760 hours. HbA1C: Recent Labs    02/26/20 1821  HGBA1C 6.2*   CBG: No results for input(s): GLUCAP in the last 168 hours. Lipid Profile: No results for input(s): CHOL, HDL, LDLCALC, TRIG, CHOLHDL, LDLDIRECT in the last 72 hours. Thyroid Function Tests: Recent Labs    02/26/20 1609  TSH 4.470   Anemia Panel: Recent Labs    02/26/20 1821  VITAMINB12 1,992*   Sepsis Labs: No results for input(s): PROCALCITON, LATICACIDVEN in the last 168 hours.  Recent Results (from the past 240 hour(s))  Resp Panel by RT-PCR (Flu A&B, Covid) Nasopharyngeal Swab     Status: None   Collection Time: 02/26/20 10:55 AM   Specimen: Nasopharyngeal Swab; Nasopharyngeal(NP) swabs in vial transport medium  Result Value Ref Range Status   SARS Coronavirus 2 by RT PCR NEGATIVE NEGATIVE Final    Comment: (NOTE) SARS-CoV-2 target nucleic acids are NOT DETECTED.  The SARS-CoV-2 RNA is generally detectable in upper respiratory specimens during the acute phase of infection. The lowest concentration of SARS-CoV-2 viral copies this assay can detect is 138 copies/mL. A negative result does not preclude SARS-Cov-2 infection and should not be used as the sole basis for treatment  or other patient management decisions. A negative result may occur with  improper specimen collection/handling, submission of specimen other than nasopharyngeal swab, presence of viral mutation(s) within the areas targeted by this assay, and inadequate number of viral copies(<138 copies/mL). A negative result must be combined with clinical observations, patient history, and epidemiological information. The expected result is Negative.  Fact Sheet for Patients:  EntrepreneurPulse.com.au  Fact Sheet for Healthcare Providers:  IncredibleEmployment.be  This test is no t yet approved or cleared by the [place] FDA and  has been authorized for detection and/or diagnosis of SARS-CoV-2 by FDA under an Emergency Use Authorization (EUA). This EUA will remain  in effect (meaning this test can be used) for the duration of the COVID-19 declaration under Section 564(b)(1) of  the Act, 21 U.S.C.section 360bbb-3(b)(1), unless the authorization is terminated  or revoked sooner.       Influenza A by PCR NEGATIVE NEGATIVE Final   Influenza B by PCR NEGATIVE NEGATIVE Final    Comment: (NOTE) The Xpert Xpress SARS-CoV-2/FLU/RSV plus assay is intended as an aid in the diagnosis of influenza from Nasopharyngeal swab specimens and should not be used as a sole basis for treatment. Nasal washings and aspirates are unacceptable for Xpert Xpress SARS-CoV-2/FLU/RSV testing.  Fact Sheet for Patients: EntrepreneurPulse.com.au  Fact Sheet for Healthcare Providers: IncredibleEmployment.be  This test is not yet approved or cleared by the [place] FDA and has been authorized for detection and/or diagnosis of SARS-CoV-2 by FDA under an Emergency Use Authorization (EUA). This EUA will remain in effect (meaning this test can be used) for the duration of the COVID-19 declaration under Section 564(b)(1) of the Act, 21 U.S.C. section  360bbb-3(b)(1), unless the authorization is terminated or revoked.  Performed at [facility], [place] [address] [place], [place] [address]          Radiology Studies: DG Chest 2 View  Result Date: 02/26/2020 CLINICAL DATA:  Dyspnea and cough for 3 weeks EXAM: CHEST - 2 VIEW COMPARISON:  01/15/2020 chest radiograph. FINDINGS: Intact sternotomy wires. Stable cardiomediastinal silhouette with moderate cardiomegaly and moderate to large hiatal hernia. No pneumothorax. No pleural effusion. Cephalization of the pulmonary vasculature without overt pulmonary edema. Low lung volumes with medial left lung base atelectasis. IMPRESSION: 1. Low lung volumes with medial left lung base atelectasis. 2. Moderate cardiomegaly without overt pulmonary edema. 3. Moderate to large hiatal hernia. Electronically Signed   By: Ilona Sorrel M.D.   On: 02/26/2020 10:13   MR BRAIN WO CONTRAST  Result Date: 02/26/2020 CLINICAL DATA:  Transient ischemic attack. Shortness of breath cough. EXAM: MRI HEAD WITHOUT CONTRAST TECHNIQUE: Multiplanar, multiecho pulse sequences of the brain and surrounding structures were obtained without intravenous contrast. COMPARISON:  CT exam 05/14/2019. FINDINGS: Brain: The study suffers from motion degradation and is abbreviated due to lack of patient tolerance. Diffusion imaging is of good quality and does not show any acute or subacute infarction. No focal abnormality affects the brainstem or cerebellum. Old small vessel infarctions in thalami. Moderate chronic small-vessel ischemic changes of the cerebral hemispheric white matter. No mass lesion, hemorrhage, hydrocephalus or extra-axial collection. Vascular: Major vessels at the base of the brain show flow. Skull and upper cervical spine: Negative Sinuses/Orbits: Clear/normal Other: None IMPRESSION: Abbreviated exam because of motion degradation and lack of patient tolerance. No acute or reversible finding. Moderate chronic small-vessel  ischemic changes affecting the brain as outlined above. Electronically Signed   By: Nelson Chimes M.D.   On: 02/26/2020 21:26   ECHOCARDIOGRAM COMPLETE  Result Date: 02/26/2020    ECHOCARDIOGRAM REPORT   Patient Name:   RONRICO DUPIN Date of Exam: 02/26/2020 Medical Rec #:  [id number]     Height:       58.0 in Accession #:    [id number]    Weight:       160.0 lb Date of Birth:  05/22/1945      BSA:          1.656 m Patient Age:    63 years      BP:           163/82 mmHg Patient Gender: M             HR:  74 bpm. Exam Location:  Inpatient Procedure: 2D Echo, Cardiac Doppler and Color Doppler Indications:    Dyspnea R06.00  History:        Patient has prior history of Echocardiogram examinations, most                 recent 08/27/2018. CAD, Stroke; Risk Factors:Hypertension and                 Dyslipidemia. Parkinson's Disease. GERD.  Sonographer:    Tiffany Dance Referring Phys: [id number] [place]  1. Left ventricular ejection fraction, by estimation, is 60 to 65%. The left ventricle has normal function. The left ventricle has no regional wall motion abnormalities. There is mild left ventricular hypertrophy. Left ventricular diastolic parameters are indeterminate. There is the interventricular septum is flattened in systole and diastole, consistent with right ventricular pressure and volume overload.  2. Right ventricular systolic function is moderately reduced. The right ventricular size is moderately enlarged. There is normal pulmonary artery systolic pressure. The estimated right ventricular systolic pressure is 33.3 mmHg.  3. Left atrial size was moderately dilated.  4. Right atrial size was severely dilated.  5. The mitral valve is normal in structure. Mild mitral valve regurgitation. No evidence of mitral stenosis.  6. Tricuspid valve regurgitation is severe.  7. The aortic valve is grossly normal. Aortic valve regurgitation is not visualized. Mild aortic valve sclerosis is  present, with no evidence of aortic valve stenosis.  8. Pulmonic valve regurgitation is moderate.  9. The inferior vena cava is dilated in size with <50% respiratory variability, suggesting right atrial pressure of 15 mmHg. FINDINGS  Left Ventricle: Left ventricular ejection fraction, by estimation, is 60 to 65%. The left ventricle has normal function. The left ventricle has no regional wall motion abnormalities. The left ventricular internal cavity size was normal in size. There is  mild left ventricular hypertrophy. The interventricular septum is flattened in systole and diastole, consistent with right ventricular pressure and volume overload. Left ventricular diastolic parameters are indeterminate. Right Ventricle: The right ventricular size is moderately enlarged. No increase in right ventricular wall thickness. Right ventricular systolic function is moderately reduced. There is normal pulmonary artery systolic pressure. The tricuspid regurgitant velocity is 2.08 m/s, and with an assumed right atrial pressure of 15 mmHg, the estimated right ventricular systolic pressure is 54.5 mmHg. Left Atrium: Left atrial size was moderately dilated. Right Atrium: Right atrial size was severely dilated. Pericardium: There is no evidence of pericardial effusion. Mitral Valve: The mitral valve is normal in structure. Mild mitral valve regurgitation. No evidence of mitral valve stenosis. Tricuspid Valve: The tricuspid valve is normal in structure. Tricuspid valve regurgitation is severe. No evidence of tricuspid stenosis. Aortic Valve: The aortic valve is grossly normal. Aortic valve regurgitation is not visualized. Mild aortic valve sclerosis is present, with no evidence of aortic valve stenosis. Pulmonic Valve: The pulmonic valve was normal in structure. Pulmonic valve regurgitation is moderate. No evidence of pulmonic stenosis. Aorta: The aortic root is normal in size and structure. Venous: The inferior vena cava is dilated  in size with less than 50% respiratory variability, suggesting right atrial pressure of 15 mmHg. IAS/Shunts: No atrial level shunt detected by color flow Doppler.  LEFT VENTRICLE PLAX 2D LVIDd:         4.70 cm LVIDs:         3.60 cm LV PW:         1.40 cm LV IVS:  1.20 cm LVOT diam:     1.90 cm LV SV:         36 LV SV Index:   22 LVOT Area:     2.84 cm  RIGHT VENTRICLE          IVC RV Basal diam:  4.60 cm  IVC diam: 3.20 cm RV Mid diam:    4.10 cm TAPSE (M-mode): 1.8 cm LEFT ATRIUM             Index       RIGHT ATRIUM           Index LA diam:        5.40 cm 3.26 cm/m  RA Area:     44.70 cm LA Vol (A2C):   83.1 ml 50.17 ml/m RA Volume:   192.00 ml 115.92 ml/m LA Vol (A4C):   68.4 ml 41.30 ml/m LA Biplane Vol: 80.0 ml 48.30 ml/m  AORTIC VALVE LVOT Vmax:   70.30 cm/s LVOT Vmean:  45.600 cm/s LVOT VTI:    0.126 m  AORTA Ao Root diam: 2.90 cm Ao Asc diam:  3.50 cm MITRAL VALVE                TRICUSPID VALVE MV Area (PHT): 3.45 cm     TR Peak grad:   17.3 mmHg MV Decel Time: 220 msec     TR Vmax:        208.00 cm/s MV E velocity: 104.05 cm/s                             SHUNTS                             Systemic VTI:  0.13 m                             Systemic Diam: 1.90 cm Candee Furbish MD Electronically signed by Candee Furbish MD Signature Date/Time: 02/26/2020/4:53:00 PM    Final         Scheduled Meds: . apixaban  5 mg Oral BID  . aspirin EC  81 mg Oral Daily  . carbidopa-levodopa  2 tablet Oral TID  . furosemide  40 mg Intravenous BID  . hydrALAZINE  25 mg Oral Q8H  . pantoprazole  80 mg Oral Q1200  . pravastatin  20 mg Oral q1800  . sodium chloride flush  3 mL Intravenous Q12H  . triamcinolone   Topical BID   Continuous Infusions: . sodium chloride    . sodium chloride       LOS: 1 day    Time spent: 35 minutes    Triniti Gruetzmacher A Daoud Lobue, MD [facility]   If 7PM-7AM, please contact night-coverage [email or web address]  02/27/2020, 9:29 AM

## 2020-02-28 DIAGNOSIS — I4891 Unspecified atrial fibrillation: Secondary | ICD-10-CM | POA: Diagnosis not present

## 2020-02-28 DIAGNOSIS — G20 Parkinson's disease: Secondary | ICD-10-CM | POA: Diagnosis not present

## 2020-02-28 DIAGNOSIS — R778 Other specified abnormalities of plasma proteins: Secondary | ICD-10-CM | POA: Diagnosis not present

## 2020-02-28 DIAGNOSIS — I5031 Acute diastolic (congestive) heart failure: Secondary | ICD-10-CM | POA: Diagnosis not present

## 2020-02-28 LAB — GLUCOSE, CAPILLARY
Glucose-Capillary: 90 mg/dL (ref 70–99)
Glucose-Capillary: 145 mg/dL — ABNORMAL HIGH (ref 70–99)
Glucose-Capillary: 108 mg/dL — ABNORMAL HIGH (ref 70–99)
Glucose-Capillary: 138 mg/dL — ABNORMAL HIGH (ref 70–99)

## 2020-02-28 LAB — BASIC METABOLIC PANEL
Anion gap: 8 (ref 5–15)
BUN: 18 mg/dL (ref 8–23)
CO2: 24 mmol/L (ref 22–32)
Calcium: 9 mg/dL (ref 8.9–10.3)
Chloride: 106 mmol/L (ref 98–111)
Creatinine, Ser: 1.13 mg/dL (ref 0.61–1.24)
GFR, Estimated: >60 mL/min (ref >60)
Glucose, Bld: 163 mg/dL — ABNORMAL HIGH (ref 70–99)
Potassium: 3.6 mmol/L (ref 3.5–5.1)
Sodium: 138 mmol/L (ref 135–145)

## 2020-02-28 MED ORDER — POTASSIUM CHLORIDE 20 MEQ PO PACK
40.0000 meq | PACK | Freq: Once | ORAL | Status: AC
  Administered 2020-02-28: 40 meq via ORAL
  Filled 2020-02-28: qty 2

## 2020-02-28 NOTE — Progress Notes (Signed)
DAILY PROGRESS NOTE   Patient Name: Brandon Rojas Date of Encounter: 02/28/2020 Cardiologist: Minus Breeding, MD  Chief Complaint   No complaints  Patient Profile   Brandon Rojas is a 75 y.o. male with a PMH of CAD s/p CABG in 2006, mitral regurgitation, HTN, HLD, PD, CVA/TIA, depression, who is being seen today for the evaluation of CHF and atrial fibrillation at the request of Dr. Jamse Arn.  Subjective   Negative another 850 cc overnight.  Labs not available today. Remains in rate-controlled afib if not bradycardic.  Objective   Vitals:   02/27/20 0859 02/27/20 1234 02/27/20 2129 02/28/20 0425  BP:  139/89 (!) 159/74 140/87  Pulse: 73 70 (!) 56 62  Resp: 11 19 19 18   Temp:  98.3 F (36.8 C) 97.6 F (36.4 C) 98.1 F (36.7 C)  TempSrc:  Oral Oral Oral  SpO2: 93% 93% 93% 95%  Weight:    72.7 kg  Height:        Intake/Output Summary (Last 24 hours) at 02/28/2020 1154 Last data filed at 02/27/2020 1418 Gross per 24 hour  Intake --  Output 850 ml  Net -850 ml   Filed Weights   02/26/20 1520 02/27/20 0331 02/28/20 0425  Weight: 74.8 kg 73.2 kg 72.7 kg    Physical Exam   General appearance: alert and no distress Neck: JVD - 3 cm above sternal notch, supple, symmetrical, trachea midline and thyroid not enlarged, symmetric, no tenderness/mass/nodules Lungs: diminished breath sounds bibasilar Heart: irregularly irregular rhythm Abdomen: soft, non-tender; bowel sounds normal; no masses,  no organomegaly Extremities: edema trace edema Pulses: 2+ and symmetric Skin: pale, warm, dry Neurologic: Mental status: Less confused, PD features PSych: Pleasant  Inpatient Medications    Scheduled Meds: . apixaban  5 mg Oral BID  . aspirin EC  81 mg Oral Daily  . carbidopa-levodopa  2 tablet Oral TID  . furosemide  40 mg Intravenous BID  . hydrALAZINE  25 mg Oral Q8H  . nystatin  5 mL Oral QID  . pantoprazole  80 mg Oral Q1200  . pravastatin  20 mg Oral q1800  .  sodium chloride flush  3 mL Intravenous Q12H  . triamcinolone   Topical BID    Continuous Infusions: . sodium chloride    . sodium chloride 20 mL/hr at 02/27/20 2153    PRN Meds: sodium chloride, acetaminophen **OR** acetaminophen, albuterol, nitroGLYCERIN, polyethylene glycol, sodium chloride flush   Labs   Results for orders placed or performed during the hospital encounter of 02/26/20 (from the past 48 hour(s))  Troponin I (High Sensitivity)     Status: Abnormal   Collection Time: 02/26/20 12:49 PM  Result Value Ref Range   Troponin I (High Sensitivity) 44 (H) <18 ng/L    Comment: (NOTE) Elevated high sensitivity troponin I (hsTnI) values and significant  changes across serial measurements may suggest ACS but many other  chronic and acute conditions are known to elevate hsTnI results.  Refer to the "Links" section for chest pain algorithms and additional  guidance. Performed at [facility], [place] [address] [place], [place] [address]   TSH     Status: None   Collection Time: 02/26/20  4:09 PM  Result Value Ref Range   TSH 4.470 0.350 - 4.500 uIU/mL    Comment: Performed by a 3rd Generation assay with a functional sensitivity of <=0.01 uIU/mL. Performed at [facility], [place] [address] [place], [place] [address]  Vitamin B12     Status: Abnormal   Collection Time: 02/26/20  6:21 PM  Result Value Ref Range   Vitamin B-12 1,992 (H) 180 - 914 pg/mL    Comment: (NOTE) This assay is not validated for testing neonatal or myeloproliferative syndrome specimens for Vitamin B12 levels. Performed at [facility], [place] [address], [place], [place] [address]   Hemoglobin A1c     Status: Abnormal   Collection Time: 02/26/20  6:21 PM  Result Value Ref Range   Hgb A1c MFr Bld 6.2 (H) 4.8 - 5.6 %    Comment: (NOTE) Pre diabetes:          5.7%-6.4%  Diabetes:              >6.4%  Glycemic control for   <7.0% adults with diabetes    Mean Plasma Glucose 131.24  mg/dL    Comment: Performed at [facility] [address] [place], [place] [address]  Basic metabolic panel     Status: None   Collection Time: 02/27/20  3:56 AM  Result Value Ref Range   Sodium 139 135 - 145 mmol/L   Potassium 3.8 3.5 - 5.1 mmol/L   Chloride 107 98 - 111 mmol/L   CO2 23 22 - 32 mmol/L   Glucose, Bld 97 70 - 99 mg/dL    Comment: Glucose reference range applies only to samples taken after fasting for at least 8 hours.   BUN 15 8 - 23 mg/dL   Creatinine, Ser 1.08 0.61 - 1.24 mg/dL   Calcium 9.2 8.9 - 10.3 mg/dL   GFR, Estimated >60 >60 mL/min    Comment: (NOTE) Calculated using the CKD-EPI Creatinine Equation (2021)    Anion gap 9 5 - 15    Comment: Performed at [facility] [address], [place], [place] [address]  CBC     Status: Abnormal   Collection Time: 02/27/20  3:56 AM  Result Value Ref Range   WBC 6.6 4.0 - 10.5 K/uL   RBC 4.23 4.22 - 5.81 MIL/uL   Hemoglobin 12.3 (L) 13.0 - 17.0 g/dL   HCT 36.4 (L) 39.0 - 52.0 %   MCV 86.1 80.0 - 100.0 fL   MCH 29.1 26.0 - 34.0 pg   MCHC 33.8 30.0 - 36.0 g/dL   RDW 14.3 11.5 - 15.5 %   Platelets 134 (L) 150 - 400 K/uL    Comment: REPEATED TO VERIFY   nRBC 0.0 0.0 - 0.2 %    Comment: Performed at [facility], [place] [address], [place], [place] [address]  Protime-INR     Status: None   Collection Time: 02/27/20  4:04 AM  Result Value Ref Range   Prothrombin Time 14.2 11.4 - 15.2 seconds   INR 1.1 0.8 - 1.2    Comment: (NOTE) INR goal varies based on device and disease states. Performed at [facility], [place] [address], [place],  [address]   Glucose, capillary     Status: None   Collection Time: 02/28/20  8:05 AM  Result Value Ref Range   Glucose-Capillary 90 70 - 99 mg/dL    Comment: Glucose reference range applies only to samples taken after fasting for at least 8 hours.    ECG   N /A  Telemetry   afib with CVR - Personally Reviewed  Radiology    MR BRAIN WO  CONTRAST  Result Date: 02/26/2020 CLINICAL DATA:  Transient  ischemic attack. Shortness of breath cough. EXAM: MRI HEAD WITHOUT CONTRAST TECHNIQUE: Multiplanar, multiecho pulse sequences of the brain and surrounding structures were obtained without intravenous contrast. COMPARISON:  CT exam 05/14/2019. FINDINGS: Brain: The study suffers from motion degradation and is abbreviated due to lack of patient tolerance. Diffusion imaging is of good quality and does not show any acute or subacute infarction. No focal abnormality affects the brainstem or cerebellum. Old small vessel infarctions in thalami. Moderate chronic small-vessel ischemic changes of the cerebral hemispheric white matter. No mass lesion, hemorrhage, hydrocephalus or extra-axial collection. Vascular: Major vessels at the base of the brain show flow. Skull and upper cervical spine: Negative Sinuses/Orbits: Clear/normal Other: None IMPRESSION: Abbreviated exam because of motion degradation and lack of patient tolerance. No acute or reversible finding. Moderate chronic small-vessel ischemic changes affecting the brain as outlined above. Electronically Signed   By: Nelson Chimes M.D.   On: 02/26/2020 21:26   ECHOCARDIOGRAM COMPLETE  Result Date: 02/26/2020    ECHOCARDIOGRAM REPORT   Patient Name:   Brandon Rojas Date of Exam: 02/26/2020 Medical Rec #:  [id number]     Height:       58.0 in Accession #:    [id number]    Weight:       160.0 lb Date of Birth:  [birth year]      BSA:          1.656 m Patient Age:    64 years      BP:           163/82 mmHg Patient Gender: M             HR:           74 bpm. Exam Location:  Inpatient Procedure: 2D Echo, Cardiac Doppler and Color Doppler Indications:    Dyspnea R06.00  History:        Patient has prior history of Echocardiogram examinations, most                 recent 08/27/2018. CAD, Stroke; Risk Factors:Hypertension and                 Dyslipidemia. Parkinson's Disease. GERD.  Sonographer:    Tiffany Dance Referring  Phys: [id number] [place]  1. Left ventricular ejection fraction, by estimation, is 60 to 65%. The left ventricle has normal function. The left ventricle has no regional wall motion abnormalities. There is mild left ventricular hypertrophy. Left ventricular diastolic parameters are indeterminate. There is the interventricular septum is flattened in systole and diastole, consistent with right ventricular pressure and volume overload.  2. Right ventricular systolic function is moderately reduced. The right ventricular size is moderately enlarged. There is normal pulmonary artery systolic pressure. The estimated right ventricular systolic pressure is 37.6 mmHg.  3. Left atrial size was moderately dilated.  4. Right atrial size was severely dilated.  5. The mitral valve is normal in structure. Mild mitral valve regurgitation. No evidence of mitral stenosis.  6. Tricuspid valve regurgitation is severe.  7. The aortic valve is grossly normal. Aortic valve regurgitation is not visualized. Mild aortic valve sclerosis is present, with no evidence of aortic valve stenosis.  8. Pulmonic valve regurgitation is moderate.  9. The inferior vena cava is dilated in size with <50% respiratory variability, suggesting right atrial pressure of 15 mmHg. FINDINGS  Left Ventricle: Left ventricular ejection fraction, by estimation, is 60 to 65%. The left ventricle has normal function. The left ventricle has  no regional wall motion abnormalities. The left ventricular internal cavity size was normal in size. There is  mild left ventricular hypertrophy. The interventricular septum is flattened in systole and diastole, consistent with right ventricular pressure and volume overload. Left ventricular diastolic parameters are indeterminate. Right Ventricle: The right ventricular size is moderately enlarged. No increase in right ventricular wall thickness. Right ventricular systolic function is moderately reduced. There is  normal pulmonary artery systolic pressure. The tricuspid regurgitant velocity is 2.08 m/s, and with an assumed right atrial pressure of 15 mmHg, the estimated right ventricular systolic pressure is 67.2 mmHg. Left Atrium: Left atrial size was moderately dilated. Right Atrium: Right atrial size was severely dilated. Pericardium: There is no evidence of pericardial effusion. Mitral Valve: The mitral valve is normal in structure. Mild mitral valve regurgitation. No evidence of mitral valve stenosis. Tricuspid Valve: The tricuspid valve is normal in structure. Tricuspid valve regurgitation is severe. No evidence of tricuspid stenosis. Aortic Valve: The aortic valve is grossly normal. Aortic valve regurgitation is not visualized. Mild aortic valve sclerosis is present, with no evidence of aortic valve stenosis. Pulmonic Valve: The pulmonic valve was normal in structure. Pulmonic valve regurgitation is moderate. No evidence of pulmonic stenosis. Aorta: The aortic root is normal in size and structure. Venous: The inferior vena cava is dilated in size with less than 50% respiratory variability, suggesting right atrial pressure of 15 mmHg. IAS/Shunts: No atrial level shunt detected by color flow Doppler.  LEFT VENTRICLE PLAX 2D LVIDd:         4.70 cm LVIDs:         3.60 cm LV PW:         1.40 cm LV IVS:        1.20 cm LVOT diam:     1.90 cm LV SV:         36 LV SV Index:   22 LVOT Area:     2.84 cm  RIGHT VENTRICLE          IVC RV Basal diam:  4.60 cm  IVC diam: 3.20 cm RV Mid diam:    4.10 cm TAPSE (M-mode): 1.8 cm LEFT ATRIUM             Index       RIGHT ATRIUM           Index LA diam:        5.40 cm 3.26 cm/m  RA Area:     44.70 cm LA Vol (A2C):   83.1 ml 50.17 ml/m RA Volume:   192.00 ml 115.92 ml/m LA Vol (A4C):   68.4 ml 41.30 ml/m LA Biplane Vol: 80.0 ml 48.30 ml/m  AORTIC VALVE LVOT Vmax:   70.30 cm/s LVOT Vmean:  45.600 cm/s LVOT VTI:    0.126 m  AORTA Ao Root diam: 2.90 cm Ao Asc diam:  3.50 cm MITRAL VALVE                 TRICUSPID VALVE MV Area (PHT): 3.45 cm     TR Peak grad:   17.3 mmHg MV Decel Time: 220 msec     TR Vmax:        208.00 cm/s MV E velocity: 104.05 cm/s                             SHUNTS  Systemic VTI:  0.13 m                             Systemic Diam: 1.90 cm Candee Furbish MD Electronically signed by Candee Furbish MD Signature Date/Time: 02/26/2020/4:53:00 PM    Final     Cardiac Studies   See above  Assessment   Principal Problem:   Atrial fibrillation, new onset ([place]) Active Problems:   Essential hypertension   History of TIA (transient ischemic attack)   Peripheral edema   Parkinson's disease (HCC)   Elevated troponin   CAD (coronary artery disease) of artery bypass graft   Type 2 diabetes mellitus with complication, without long-term current use of insulin (HCC)   Dyspnea   Acute diastolic CHF (congestive heart failure) ([place])   Plan   1. Continue lasix. Check BMET - no lab orders pending. Possible TEE/DCCV next week - orders placed with NPO after Monday night, but does not appear to be on the schedule for Tuesday. On Eliquis.  Time Spent Directly with Patient:  I have spent a total of 25 minutes with the patient reviewing hospital notes, telemetry, EKGs, labs and examining the patient as well as establishing an assessment and plan that was discussed personally with the patient.  > 50% of time was spent in direct patient care.  Length of Stay:  LOS: 2 days   Pixie Casino, MD, [facility], [place] Director of the [facility] Lip[facility]  [facility] Diplomate of the American Board of Clinical Lipidology Attending Cardiologist  Direct Dial: [phone]  Fax: [phone]  Website:  [email or web address] 02/28/2020, 11:54 AM

## 2020-02-28 NOTE — Progress Notes (Signed)
PROGRESS NOTE    Brandon Rojas  FIE:[id number] DOB: 1945/10/11 DOA: 02/26/2020 PCP: Biagio Borg, MD   Brief Narrative: 75 year old with past medical history significant for Parkinson disease, CAD status post CABG, history of TIA who presents with progressive dyspnea and functional decline over several months.  Over the last 2 weeks patient has been having worsening shortness of breath.  He report weight gain from 148 pounds a couple of months ago now weight 160 pounds.  Patient also has been noted confused, cognition has declined, reported some patient hallucination.  Patient was admitted with new onset A. fib, acute diastolic heart failure exacerbation, severe tricuspid valve regurgitation.     Assessment & Plan:   Principal Problem:   Atrial fibrillation, new onset ([place]) Active Problems:   Essential hypertension   History of TIA (transient ischemic attack)   Peripheral edema   Parkinson's disease (HCC)   Elevated troponin   CAD (coronary artery disease) of artery bypass graft   Type 2 diabetes mellitus with complication, without long-term current use of insulin (HCC)   Dyspnea   Acute diastolic CHF (congestive heart failure) ([place])   1-new onset A. Fib: -Patient started on Eliquis.  -Rate controlled.  -Plan for cardioversion this admission. Awaiting schedule.  -Cardiology following.   2-Acute diastolic Heart failure, Severe tricuspid Valve regurgitation;  Continue with IV laix 40 mg IV BID>  Weight 165---160. --160 Strict I and O.  Negative 2 L. Urine out put: 166  3-Acute metabolic encephalopathy, cognitive difficulties, visual hallucination: -Appreciate  neurology evaluation. -Likely related to acute illness, heart failure. -MRI negative for stroke  4-elevation of troponin: Likely demand ischemia  5-Parkinson disease, associated with dementia.  continue with sinemet.   6-history of TIA: Continue with aspirin Plavix and statins. 7-Oral thrush; started nystatin.   Estimated body mass index is 27.5 kg/m as calculated from the following:   Height as of this encounter: 5\' 4"  (1.626 m).   Weight as of this encounter: 72.7 kg.   DVT prophylaxis:  eliquis Code Status: Full code Family Communication: care discussed with wife who was at bedside 2/19 Disposition Plan:  Status is: Inpatient  Remains inpatient appropriate because:IV treatments appropriate due to intensity of illness or inability to take PO   Dispo: The patient is from: Home              Anticipated d/c is to: Home              Anticipated d/c date is: 3 days              Patient currently is not medically stable to d/c.   Difficult to place patient No        Consultants:   Cardiology  Neurology   Procedures:     Antimicrobials:    Subjective: He denies chest pain or dyspnea.  He is eating breakfast with assistance of his nurse.     Objective: Vitals:   02/27/20 1234 02/27/20 2129 02/28/20 0425 02/28/20 1603  BP: 139/89 (!) 159/74 140/87 (!) 153/88  Pulse: 70 (!) 56 62 (!) 56  Resp: 19 19 18 17   Temp: 98.3 F (36.8 C) 97.6 F (36.4 C) 98.1 F (36.7 C) 97.8 F (36.6 C)  TempSrc: Oral Oral Oral Oral  SpO2: 93% 93% 95%   Weight:   72.7 kg   Height:       No intake or output data in the 24 hours ending 02/28/20 [place]  02/26/20 1520 02/27/20 0331 02/28/20 0425  Weight: 74.8 kg 73.2 kg 72.7 kg    Examination:  General exam: NAD Respiratory system: B/L crackles.  Cardiovascular system: S 1, S 2 IRR Gastrointestinal system: BS present, soft, nt Central nervous system: Alert  Extremities: plus 2 edema  Data Reviewed: I have personally reviewed following labs and imaging studies  CBC: Recent Labs  Lab 02/26/20 1003 02/27/20 0356  WBC 8.1 6.6  HGB 12.3* 12.3*  HCT 37.4* 36.4*  MCV 87.8 86.1  PLT 118* 102*   Basic Metabolic Panel: Recent Labs  Lab 02/26/20 1003 02/27/20 0356 02/28/20 1324  NA 138 139 138  K 4.0 3.8 3.6   CL 106 107 106  CO2 20* 23 24  GLUCOSE 121* 97 163*  BUN 17 15 18   CREATININE 1.05 1.08 1.13  CALCIUM 9.2 9.2 9.0   GFR: Estimated Creatinine Clearance: 51.6 mL/min (by C-G formula based on SCr of 1.13 mg/dL). Liver Function Tests: Recent Labs  Lab 02/26/20 1003  AST 25  ALT 5  ALKPHOS 106  BILITOT 0.9  PROT 6.4*  ALBUMIN 3.1*   No results for input(s): LIPASE, AMYLASE in the last 168 hours. No results for input(s): AMMONIA in the last 168 hours. Coagulation Profile: Recent Labs  Lab 02/27/20 0404  INR 1.1   Cardiac Enzymes: No results for input(s): CKTOTAL, CKMB, CKMBINDEX, TROPONINI in the last 168 hours. BNP (last 3 results) No results for input(s): PROBNP in the last 8760 hours. HbA1C: Recent Labs    02/26/20 1821  HGBA1C 6.2*   CBG: Recent Labs  Lab 02/28/20 0805 02/28/20 1203  GLUCAP 90 145*   Lipid Profile: No results for input(s): CHOL, HDL, LDLCALC, TRIG, CHOLHDL, LDLDIRECT in the last 72 hours. Thyroid Function Tests: Recent Labs    02/26/20 1609  TSH 4.470   Anemia Panel: Recent Labs    02/26/20 1821  VITAMINB12 1,992*   Sepsis Labs: No results for input(s): PROCALCITON, LATICACIDVEN in the last 168 hours.  Recent Results (from the past 240 hour(s))  Resp Panel by RT-PCR (Flu A&B, Covid) Nasopharyngeal Swab     Status: None   Collection Time: 02/26/20 10:55 AM   Specimen: Nasopharyngeal Swab; Nasopharyngeal(NP) swabs in vial transport medium  Result Value Ref Range Status   SARS Coronavirus 2 by RT PCR NEGATIVE NEGATIVE Final    Comment: (NOTE) SARS-CoV-2 target nucleic acids are NOT DETECTED.  The SARS-CoV-2 RNA is generally detectable in upper respiratory specimens during the acute phase of infection. The lowest concentration of SARS-CoV-2 viral copies this assay can detect is 138 copies/mL. A negative result does not preclude SARS-Cov-2 infection and should not be used as the sole basis for treatment or other patient  management decisions. A negative result may occur with  improper specimen collection/handling, submission of specimen other than nasopharyngeal swab, presence of viral mutation(s) within the areas targeted by this assay, and inadequate number of viral copies(<138 copies/mL). A negative result must be combined with clinical observations, patient history, and epidemiological information. The expected result is Negative.  Fact Sheet for Patients:  EntrepreneurPulse.com.au  Fact Sheet for Healthcare Providers:  IncredibleEmployment.be  This test is no t yet approved or cleared by the [place] FDA and  has been authorized for detection and/or diagnosis of SARS-CoV-2 by FDA under an Emergency Use Authorization (EUA). This EUA will remain  in effect (meaning this test can be used) for the duration of the COVID-19 declaration under Section 564(b)(1) of the Act, 21  U.S.C.section 360bbb-3(b)(1), unless the authorization is terminated  or revoked sooner.       Influenza A by PCR NEGATIVE NEGATIVE Final   Influenza B by PCR NEGATIVE NEGATIVE Final    Comment: (NOTE) The Xpert Xpress SARS-CoV-2/FLU/RSV plus assay is intended as an aid in the diagnosis of influenza from Nasopharyngeal swab specimens and should not be used as a sole basis for treatment. Nasal washings and aspirates are unacceptable for Xpert Xpress SARS-CoV-2/FLU/RSV testing.  Fact Sheet for Patients: EntrepreneurPulse.com.au  Fact Sheet for Healthcare Providers: IncredibleEmployment.be  This test is not yet approved or cleared by the [place] FDA and has been authorized for detection and/or diagnosis of SARS-CoV-2 by FDA under an Emergency Use Authorization (EUA). This EUA will remain in effect (meaning this test can be used) for the duration of the COVID-19 declaration under Section 564(b)(1) of the Act, 21 U.S.C. section 360bbb-3(b)(1),  unless the authorization is terminated or revoked.  Performed at [facility], [place] [address] [place], [place] [address]          Radiology Studies: MR BRAIN WO CONTRAST  Result Date: 02/26/2020 CLINICAL DATA:  Transient ischemic attack. Shortness of breath cough. EXAM: MRI HEAD WITHOUT CONTRAST TECHNIQUE: Multiplanar, multiecho pulse sequences of the brain and surrounding structures were obtained without intravenous contrast. COMPARISON:  CT exam 05/14/2019. FINDINGS: Brain: The study suffers from motion degradation and is abbreviated due to lack of patient tolerance. Diffusion imaging is of good quality and does not show any acute or subacute infarction. No focal abnormality affects the brainstem or cerebellum. Old small vessel infarctions in thalami. Moderate chronic small-vessel ischemic changes of the cerebral hemispheric white matter. No mass lesion, hemorrhage, hydrocephalus or extra-axial collection. Vascular: Major vessels at the base of the brain show flow. Skull and upper cervical spine: Negative Sinuses/Orbits: Clear/normal Other: None IMPRESSION: Abbreviated exam because of motion degradation and lack of patient tolerance. No acute or reversible finding. Moderate chronic small-vessel ischemic changes affecting the brain as outlined above. Electronically Signed   By: Nelson Chimes M.D.   On: 02/26/2020 21:26   ECHOCARDIOGRAM COMPLETE  Result Date: 02/26/2020    ECHOCARDIOGRAM REPORT   Patient Name:   SHAWNTEZ DICKISON Date of Exam: 02/26/2020 Medical Rec #:  [id number]     Height:       58.0 in Accession #:    [id number]    Weight:       160.0 lb Date of Birth:  08/19/1945      BSA:          1.656 m Patient Age:    49 years      BP:           163/82 mmHg Patient Gender: M             HR:           74 bpm. Exam Location:  Inpatient Procedure: 2D Echo, Cardiac Doppler and Color Doppler Indications:    Dyspnea R06.00  History:        Patient has prior history of Echocardiogram  examinations, most                 recent 08/27/2018. CAD, Stroke; Risk Factors:Hypertension and                 Dyslipidemia. Parkinson's Disease. GERD.  Sonographer:    Tiffany Dance Referring Phys: [id number] [place]  1. Left ventricular ejection fraction, by estimation, is 60 to  65%. The left ventricle has normal function. The left ventricle has no regional wall motion abnormalities. There is mild left ventricular hypertrophy. Left ventricular diastolic parameters are indeterminate. There is the interventricular septum is flattened in systole and diastole, consistent with right ventricular pressure and volume overload.  2. Right ventricular systolic function is moderately reduced. The right ventricular size is moderately enlarged. There is normal pulmonary artery systolic pressure. The estimated right ventricular systolic pressure is 06.2 mmHg.  3. Left atrial size was moderately dilated.  4. Right atrial size was severely dilated.  5. The mitral valve is normal in structure. Mild mitral valve regurgitation. No evidence of mitral stenosis.  6. Tricuspid valve regurgitation is severe.  7. The aortic valve is grossly normal. Aortic valve regurgitation is not visualized. Mild aortic valve sclerosis is present, with no evidence of aortic valve stenosis.  8. Pulmonic valve regurgitation is moderate.  9. The inferior vena cava is dilated in size with <50% respiratory variability, suggesting right atrial pressure of 15 mmHg. FINDINGS  Left Ventricle: Left ventricular ejection fraction, by estimation, is 60 to 65%. The left ventricle has normal function. The left ventricle has no regional wall motion abnormalities. The left ventricular internal cavity size was normal in size. There is  mild left ventricular hypertrophy. The interventricular septum is flattened in systole and diastole, consistent with right ventricular pressure and volume overload. Left ventricular diastolic parameters are  indeterminate. Right Ventricle: The right ventricular size is moderately enlarged. No increase in right ventricular wall thickness. Right ventricular systolic function is moderately reduced. There is normal pulmonary artery systolic pressure. The tricuspid regurgitant velocity is 2.08 m/s, and with an assumed right atrial pressure of 15 mmHg, the estimated right ventricular systolic pressure is 69.4 mmHg. Left Atrium: Left atrial size was moderately dilated. Right Atrium: Right atrial size was severely dilated. Pericardium: There is no evidence of pericardial effusion. Mitral Valve: The mitral valve is normal in structure. Mild mitral valve regurgitation. No evidence of mitral valve stenosis. Tricuspid Valve: The tricuspid valve is normal in structure. Tricuspid valve regurgitation is severe. No evidence of tricuspid stenosis. Aortic Valve: The aortic valve is grossly normal. Aortic valve regurgitation is not visualized. Mild aortic valve sclerosis is present, with no evidence of aortic valve stenosis. Pulmonic Valve: The pulmonic valve was normal in structure. Pulmonic valve regurgitation is moderate. No evidence of pulmonic stenosis. Aorta: The aortic root is normal in size and structure. Venous: The inferior vena cava is dilated in size with less than 50% respiratory variability, suggesting right atrial pressure of 15 mmHg. IAS/Shunts: No atrial level shunt detected by color flow Doppler.  LEFT VENTRICLE PLAX 2D LVIDd:         4.70 cm LVIDs:         3.60 cm LV PW:         1.40 cm LV IVS:        1.20 cm LVOT diam:     1.90 cm LV SV:         36 LV SV Index:   22 LVOT Area:     2.84 cm  RIGHT VENTRICLE          IVC RV Basal diam:  4.60 cm  IVC diam: 3.20 cm RV Mid diam:    4.10 cm TAPSE (M-mode): 1.8 cm LEFT ATRIUM             Index       RIGHT ATRIUM  Index LA diam:        5.40 cm 3.26 cm/m  RA Area:     44.70 cm LA Vol (A2C):   83.1 ml 50.17 ml/m RA Volume:   192.00 ml 115.92 ml/m LA Vol (A4C):    68.4 ml 41.30 ml/m LA Biplane Vol: 80.0 ml 48.30 ml/m  AORTIC VALVE LVOT Vmax:   70.30 cm/s LVOT Vmean:  45.600 cm/s LVOT VTI:    0.126 m  AORTA Ao Root diam: 2.90 cm Ao Asc diam:  3.50 cm MITRAL VALVE                TRICUSPID VALVE MV Area (PHT): 3.45 cm     TR Peak grad:   17.3 mmHg MV Decel Time: 220 msec     TR Vmax:        208.00 cm/s MV E velocity: 104.05 cm/s                             SHUNTS                             Systemic VTI:  0.13 m                             Systemic Diam: 1.90 cm Candee Furbish MD Electronically signed by Candee Furbish MD Signature Date/Time: 02/26/2020/4:53:00 PM    Final         Scheduled Meds: . apixaban  5 mg Oral BID  . aspirin EC  81 mg Oral Daily  . carbidopa-levodopa  2 tablet Oral TID  . furosemide  40 mg Intravenous BID  . hydrALAZINE  25 mg Oral Q8H  . nystatin  5 mL Oral QID  . pantoprazole  80 mg Oral Q1200  . pravastatin  20 mg Oral q1800  . sodium chloride flush  3 mL Intravenous Q12H  . triamcinolone   Topical BID   Continuous Infusions: . sodium chloride    . sodium chloride 20 mL/hr at 02/27/20 2153     LOS: 2 days    Time spent: 35 minutes    Yarisa Lynam A Remington Skalsky, MD [facility]   If 7PM-7AM, please contact night-coverage [email or web address]  02/28/2020, 4:06 PM

## 2020-02-29 ENCOUNTER — Inpatient Hospital Stay (HOSPITAL_COMMUNITY): Payer: Medicare HMO

## 2020-02-29 DIAGNOSIS — R0602 Shortness of breath: Secondary | ICD-10-CM | POA: Diagnosis not present

## 2020-02-29 DIAGNOSIS — I5031 Acute diastolic (congestive) heart failure: Secondary | ICD-10-CM | POA: Diagnosis not present

## 2020-02-29 DIAGNOSIS — I4891 Unspecified atrial fibrillation: Secondary | ICD-10-CM | POA: Diagnosis not present

## 2020-02-29 DIAGNOSIS — I10 Essential (primary) hypertension: Secondary | ICD-10-CM

## 2020-02-29 DIAGNOSIS — R609 Edema, unspecified: Secondary | ICD-10-CM

## 2020-02-29 DIAGNOSIS — I25810 Atherosclerosis of coronary artery bypass graft(s) without angina pectoris: Secondary | ICD-10-CM | POA: Diagnosis not present

## 2020-02-29 DIAGNOSIS — Z8673 Personal history of transient ischemic attack (TIA), and cerebral infarction without residual deficits: Secondary | ICD-10-CM

## 2020-02-29 DIAGNOSIS — E118 Type 2 diabetes mellitus with unspecified complications: Secondary | ICD-10-CM

## 2020-02-29 LAB — BASIC METABOLIC PANEL
Anion gap: 15 (ref 5–15)
BUN: 22 mg/dL (ref 8–23)
CO2: 22 mmol/L (ref 22–32)
Calcium: 9.3 mg/dL (ref 8.9–10.3)
Chloride: 103 mmol/L (ref 98–111)
Creatinine, Ser: 1.23 mg/dL (ref 0.61–1.24)
GFR, Estimated: >60 mL/min (ref >60)
Glucose, Bld: 108 mg/dL — ABNORMAL HIGH (ref 70–99)
Potassium: 4 mmol/L (ref 3.5–5.1)
Sodium: 140 mmol/L (ref 135–145)

## 2020-02-29 LAB — GLUCOSE, CAPILLARY
Glucose-Capillary: 100 mg/dL — ABNORMAL HIGH (ref 70–99)
Glucose-Capillary: 100 mg/dL — ABNORMAL HIGH (ref 70–99)
Glucose-Capillary: 106 mg/dL — ABNORMAL HIGH (ref 70–99)
Glucose-Capillary: 109 mg/dL — ABNORMAL HIGH (ref 70–99)
Glucose-Capillary: 112 mg/dL — ABNORMAL HIGH (ref 70–99)

## 2020-02-29 LAB — CBC
HCT: 36.4 % — ABNORMAL LOW (ref 39.0–52.0)
Hemoglobin: 12.2 g/dL — ABNORMAL LOW (ref 13.0–17.0)
MCH: 28.6 pg (ref 26.0–34.0)
MCHC: 33.5 g/dL (ref 30.0–36.0)
MCV: 85.4 fL (ref 80.0–100.0)
Platelets: 165 10*3/uL (ref 150–400)
RBC: 4.26 MIL/uL (ref 4.22–5.81)
RDW: 14.2 % (ref 11.5–15.5)
WBC: 10 10*3/uL (ref 4.0–10.5)
nRBC: 0 % (ref 0.0–0.2)

## 2020-02-29 NOTE — Plan of Care (Signed)

## 2020-02-29 NOTE — Progress Notes (Signed)
PROGRESS NOTE    Brandon Rojas  VOZ:[id number] DOB: [birth year] DOA: 02/26/2020 PCP: Biagio Borg, MD   Brief Narrative: Patient is a 75 years old male with past medical history of Parkinson's disease, coronary artery disease status post CABG, history of TIA presented to hospital with dyspnea and shortness of breath with generalized debility and functional decline for several months with 2 weeks history of worsening shortness of breath and weight gain.  Patient was also confused on presentation with some hallucinations.  Patient was then admitted to hospital for new onset atrial fibrillation, acute diastolic heart failure exacerbation with severe tricuspid valve regurgitation.  Cardiology was consulted.  Assessment & Plan:   Principal Problem:   Atrial fibrillation, new onset ([place]) Active Problems:   Essential hypertension   History of TIA (transient ischemic attack)   Peripheral edema   Parkinson's disease (HCC)   Elevated troponin   CAD (coronary artery disease) of artery bypass graft   Type 2 diabetes mellitus with complication, without long-term current use of insulin (HCC)   Dyspnea   Acute diastolic CHF (congestive heart failure) ([place])   New onset paroxysmal atrial fibrillation. Cardiology was consulted.  Patient is asymptomatic at this time.  Rate controlled.  On Eliquis.  Cardiology planning for TEE with DCCV.    Acute diastolic Heart failure, Severe tricuspid Valve regurgitation;  Currently on IV Lasix 40 twice daily.  Cardiology on board.  Continue strict intake and output charting.  Daily weights.  Weight is trending down.    Acute metabolic encephalopathy, cognitive difficulties, visual hallucination: Likely secondary to metabolic issues including heart failure.  MRI was negative for acute stroke.  Seen by neurology.  Continue to monitor closely.  Elevation of troponin: Likely demand ischemia.  No chest pain or acute ischemic changes in the EKG.  Parkinson disease  associated with dementia.  continue with sinemet.  Continue supportive care.  history of TIA: Continue aspirin, Plavix and statins for  Oral thrush; continue nystatin.  DVT prophylaxis: Eliquis  Code Status:  Full code  Family Communication:  None today.  Disposition Plan:  Status is: Inpatient  Remains inpatient appropriate because:IV treatments appropriate due to intensity of illness or inability to take PO, possible cardiac intervention.   Dispo: The patient is from: Home              Anticipated d/c is to: Home              Anticipated d/c date is: 2 to 3 days              Patient currently is not medically stable to d/c.   Difficult to place patient No   Consultants:   Cardiology  Neurology   Procedures:   None yet  Antimicrobials:  None  Subjective: Today, patient was seen and examined at bedside.  Patient denies any chest pain, shortness of breath, nausea, vomiting but has mild cough.   Objective: Vitals:   02/28/20 1603 02/28/20 2001 02/29/20 0011 02/29/20 0507  BP: (!) 153/88 (!) 145/93 (!) 153/76 (!) 152/87  Pulse: (!) 56 (!) 52 62 74  Resp: 17 (!) 22 (!) 22 19  Temp: 97.8 F (36.6 C) 98.4 F (36.9 C) 97.8 F (36.6 C) 98.2 F (36.8 C)  TempSrc: Oral  Oral Oral  SpO2:  96% 96% 93%  Weight:    72 kg  Height:        Intake/Output Summary (Last 24 hours) at 02/29/2020 4259 Last data filed  at 02/28/2020 2215 Gross per 24 hour  Intake 120 ml  Output --  Net 120 ml   Filed Weights   02/27/20 0331 02/28/20 0425 02/29/20 0507  Weight: 73.2 kg 72.7 kg 72 kg   Body mass index is 27.25 kg/m.   Physical examination: General:  Average built, not in obvious distress, alert awake and communicative, HENT:   No scleral pallor or icterus noted. Oral mucosa is moist.  Chest:   Diminished breath sounds bilaterally.  CVS: S1 &S2 heard. No murmur.  Regular rate and rhythm.  CABG scar noted. Abdomen: Soft, nontender, nondistended.  Bowel sounds are  heard.   Extremities: No cyanosis, clubbing but bilateral lower extremity edema noted.  Peripheral pulses are palpable. Psych: Alert, awake and communicative,, normal mood CNS:  No cranial nerve deficits.  Power equal in all extremities.   Skin: Warm and dry.  No rashes noted.   Data Reviewed: I have personally reviewed following labs and imaging studies  CBC: Recent Labs  Lab 02/26/20 1003 02/27/20 0356 02/29/20 0207  WBC 8.1 6.6 10.0  HGB 12.3* 12.3* 12.2*  HCT 37.4* 36.4* 36.4*  MCV 87.8 86.1 85.4  PLT 118* 134* 179   Basic Metabolic Panel: Recent Labs  Lab 02/26/20 1003 02/27/20 0356 02/28/20 1324 02/29/20 0207  NA 138 139 138 140  K 4.0 3.8 3.6 4.0  CL 106 107 106 103  CO2 20* 23 24 22   GLUCOSE 121* 97 163* 108*  BUN 17 15 18 22   CREATININE 1.05 1.08 1.13 1.23  CALCIUM 9.2 9.2 9.0 9.3   GFR: Estimated Creatinine Clearance: 47.2 mL/min (by C-G formula based on SCr of 1.23 mg/dL). Liver Function Tests: Recent Labs  Lab 02/26/20 1003  AST 25  ALT 5  ALKPHOS 106  BILITOT 0.9  PROT 6.4*  ALBUMIN 3.1*   No results for input(s): LIPASE, AMYLASE in the last 168 hours. No results for input(s): AMMONIA in the last 168 hours. Coagulation Profile: Recent Labs  Lab 02/27/20 0404  INR 1.1   Cardiac Enzymes: No results for input(s): CKTOTAL, CKMB, CKMBINDEX, TROPONINI in the last 168 hours. BNP (last 3 results) No results for input(s): PROBNP in the last 8760 hours. HbA1C: Recent Labs    02/26/20 1821  HGBA1C 6.2*   CBG: Recent Labs  Lab 02/28/20 1203 02/28/20 1642 02/28/20 1956 02/28/20 2354 02/29/20 0802  GLUCAP 145* 108* 138* 106* 100*   Lipid Profile: No results for input(s): CHOL, HDL, LDLCALC, TRIG, CHOLHDL, LDLDIRECT in the last 72 hours. Thyroid Function Tests: Recent Labs    02/26/20 1609  TSH 4.470   Anemia Panel: Recent Labs    02/26/20 1821  VITAMINB12 1,992*   Sepsis Labs: No results for input(s): PROCALCITON,  LATICACIDVEN in the last 168 hours.  Recent Results (from the past 240 hour(s))  Resp Panel by RT-PCR (Flu A&B, Covid) Nasopharyngeal Swab     Status: None   Collection Time: 02/26/20 10:55 AM   Specimen: Nasopharyngeal Swab; Nasopharyngeal(NP) swabs in vial transport medium  Result Value Ref Range Status   SARS Coronavirus 2 by RT PCR NEGATIVE NEGATIVE Final    Comment: (NOTE) SARS-CoV-2 target nucleic acids are NOT DETECTED.  The SARS-CoV-2 RNA is generally detectable in upper respiratory specimens during the acute phase of infection. The lowest concentration of SARS-CoV-2 viral copies this assay can detect is 138 copies/mL. A negative result does not preclude SARS-Cov-2 infection and should not be used as the sole basis for treatment or other patient  management decisions. A negative result may occur with  improper specimen collection/handling, submission of specimen other than nasopharyngeal swab, presence of viral mutation(s) within the areas targeted by this assay, and inadequate number of viral copies(<138 copies/mL). A negative result must be combined with clinical observations, patient history, and epidemiological information. The expected result is Negative.  Fact Sheet for Patients:  EntrepreneurPulse.com.au  Fact Sheet for Healthcare Providers:  IncredibleEmployment.be  This test is no t yet approved or cleared by the [place] FDA and  has been authorized for detection and/or diagnosis of SARS-CoV-2 by FDA under an Emergency Use Authorization (EUA). This EUA will remain  in effect (meaning this test can be used) for the duration of the COVID-19 declaration under Section 564(b)(1) of the Act, 21 U.S.C.section 360bbb-3(b)(1), unless the authorization is terminated  or revoked sooner.       Influenza A by PCR NEGATIVE NEGATIVE Final   Influenza B by PCR NEGATIVE NEGATIVE Final    Comment: (NOTE) The Xpert Xpress  SARS-CoV-2/FLU/RSV plus assay is intended as an aid in the diagnosis of influenza from Nasopharyngeal swab specimens and should not be used as a sole basis for treatment. Nasal washings and aspirates are unacceptable for Xpert Xpress SARS-CoV-2/FLU/RSV testing.  Fact Sheet for Patients: EntrepreneurPulse.com.au  Fact Sheet for Healthcare Providers: IncredibleEmployment.be  This test is not yet approved or cleared by the [place] FDA and has been authorized for detection and/or diagnosis of SARS-CoV-2 by FDA under an Emergency Use Authorization (EUA). This EUA will remain in effect (meaning this test can be used) for the duration of the COVID-19 declaration under Section 564(b)(1) of the Act, 21 U.S.C. section 360bbb-3(b)(1), unless the authorization is terminated or revoked.  Performed at [facility], [place] [address], [place], [place] [address]          Radiology Studies: No results found.     Scheduled Meds: . apixaban  5 mg Oral BID  . aspirin EC  81 mg Oral Daily  . carbidopa-levodopa  2 tablet Oral TID  . furosemide  40 mg Intravenous BID  . hydrALAZINE  25 mg Oral Q8H  . nystatin  5 mL Oral QID  . pantoprazole  80 mg Oral Q1200  . pravastatin  20 mg Oral q1800  . sodium chloride flush  3 mL Intravenous Q12H  . triamcinolone   Topical BID   Continuous Infusions: . sodium chloride    . sodium chloride 20 mL/hr at 02/27/20 2153     LOS: 3 days   Flora Lipps, MD [facility]   If 7PM-7AM, please contact night-coverage [email or web address]  02/29/2020, 9:28 AM

## 2020-02-29 NOTE — Progress Notes (Signed)
Progress Note  Patient Name: Brandon Rojas Date of Encounter: 02/29/2020  [facility] Cardiologist: Minus Breeding, MD   Subjective   75 year old gentleman with a history of coronary artery disease, CABG, mitral regurgitation, tricuspid regurgitation, hypertension, hyperlipidemia.  He also has Parkinson's disease.  He was seen for further evaluation of congestive heart failure and atrial fibrillation.  He is diuresed a total of 1.9 L so far during this hospitalization.  His HR is normal .    Inpatient Medications    Scheduled Meds: . apixaban  5 mg Oral BID  . aspirin EC  81 mg Oral Daily  . carbidopa-levodopa  2 tablet Oral TID  . furosemide  40 mg Intravenous BID  . hydrALAZINE  25 mg Oral Q8H  . nystatin  5 mL Oral QID  . pantoprazole  80 mg Oral Q1200  . pravastatin  20 mg Oral q1800  . sodium chloride flush  3 mL Intravenous Q12H  . triamcinolone   Topical BID   Continuous Infusions: . sodium chloride    . sodium chloride 20 mL/hr at 02/27/20 2153   PRN Meds: sodium chloride, acetaminophen **OR** acetaminophen, albuterol, nitroGLYCERIN, polyethylene glycol, sodium chloride flush   Vital Signs    Vitals:   02/28/20 1603 02/28/20 2001 02/29/20 0011 02/29/20 0507  BP: (!) 153/88 (!) 145/93 (!) 153/76 (!) 152/87  Pulse: (!) 56 (!) 52 62 74  Resp: 17 (!) 22 (!) 22 19  Temp: 97.8 F (36.6 C) 98.4 F (36.9 C) 97.8 F (36.6 C) 98.2 F (36.8 C)  TempSrc: Oral  Oral Oral  SpO2:  96% 96% 93%  Weight:    72 kg  Height:        Intake/Output Summary (Last 24 hours) at 02/29/2020 1000 Last data filed at 02/28/2020 2215 Gross per 24 hour  Intake 120 ml  Output --  Net 120 ml   Last 3 Weights 02/29/2020 02/28/2020 02/27/2020  Weight (lbs) 158 lb 11.7 oz 160 lb 3.2 oz 161 lb 6 oz  Weight (kg) 72 kg 72.666 kg 73.2 kg      Telemetry    Atrial fib with controlled v response  - Personally Reviewed  ECG     - Personally Reviewed  Physical Exam    GEN: No  acute distress.   Neck: No JVD Cardiac: irreg. Irreg.   Soft systolic murmur  Respiratory: clear breath sounds anteriorly  GI: Soft, nontender, non-distended  MS: No edema; No deformity. Neuro:  Nonfocal  Psych: parkinsons   Labs    High Sensitivity Troponin:   Recent Labs  Lab 02/26/20 1003 02/26/20 1249  TROPONINIHS 44* 44*      Chemistry Recent Labs  Lab 02/26/20 1003 02/27/20 0356 02/28/20 1324 02/29/20 0207  NA 138 139 138 140  K 4.0 3.8 3.6 4.0  CL 106 107 106 103  CO2 20* 23 24 22   GLUCOSE 121* 97 163* 108*  BUN 17 15 18 22   CREATININE 1.05 1.08 1.13 1.23  CALCIUM 9.2 9.2 9.0 9.3  PROT 6.4*  --   --   --   ALBUMIN 3.1*  --   --   --   AST 25  --   --   --   ALT 5  --   --   --   ALKPHOS 106  --   --   --   BILITOT 0.9  --   --   --   GFRNONAA >60 >60 >60 >60  ANIONGAP 12 9 8 15      Hematology Recent Labs  Lab 02/26/20 1003 02/27/20 0356 02/29/20 0207  WBC 8.1 6.6 10.0  RBC 4.26 4.23 4.26  HGB 12.3* 12.3* 12.2*  HCT 37.4* 36.4* 36.4*  MCV 87.8 86.1 85.4  MCH 28.9 29.1 28.6  MCHC 32.9 33.8 33.5  RDW 14.3 14.3 14.2  PLT 118* 134* 165    BNP Recent Labs  Lab 02/26/20 1014  BNP 598.7*     DDimer No results for input(s): DDIMER in the last 168 hours.   Radiology    No results found.  Cardiac Studies     Patient Profile     75 y.o. male with parkinsons Atrial fib with controlled V response   Assessment & Plan    1. Atrial fib with controlled. V response:  [name] presents since with symptoms of congestive heart failure.  His ejection fraction is 60 to 65%.  We were unable to evaluate his diastolic parameters. He was found to have RV pressure and volume overload. His rate is extremely well controlled and I do not think that his deterioration is actually due to the presence of atrial fibrillation. He is scheduled for TEE / CV tomorrow  I've discussed risks, benefits, options. He has marked bi-atrial enlargement - I think the  changes of him maintaining SR are low   2.  Acute congestive heart failure: The patient is found severe tricuspid regurgitation with evidence of RV pressure and volume overload.           For questions or updates, please contact [place] Please consult [email or web address] for contact info under        Signed, Brandon Moores, MD  02/29/2020, 10:00 AM

## 2020-02-29 NOTE — Care Management Important Message (Signed)
Important Message  Patient Details  Name: Brandon Rojas MRN: [id number] Date of Birth: February 25, 1945   Medicare Important Message Given:  Yes     Orbie Pyo 02/29/2020, 4:45 PM

## 2020-03-01 ENCOUNTER — Ambulatory Visit: Payer: Medicare HMO | Admitting: Neurology

## 2020-03-01 ENCOUNTER — Inpatient Hospital Stay (HOSPITAL_COMMUNITY): Payer: Medicare HMO | Admitting: Certified Registered Nurse Anesthetist

## 2020-03-01 ENCOUNTER — Other Ambulatory Visit: Payer: Self-pay

## 2020-03-01 ENCOUNTER — Encounter (HOSPITAL_COMMUNITY): Payer: Self-pay | Admitting: Internal Medicine

## 2020-03-01 ENCOUNTER — Encounter (HOSPITAL_COMMUNITY)
Admission: EM | Disposition: A | Discharge status: Home Health | Payer: Self-pay | Source: Home / Self Care | Attending: Internal Medicine

## 2020-03-01 ENCOUNTER — Inpatient Hospital Stay (HOSPITAL_COMMUNITY): Payer: Medicare HMO

## 2020-03-01 DIAGNOSIS — I361 Nonrheumatic tricuspid (valve) insufficiency: Secondary | ICD-10-CM

## 2020-03-01 DIAGNOSIS — I340 Nonrheumatic mitral (valve) insufficiency: Secondary | ICD-10-CM | POA: Diagnosis not present

## 2020-03-01 DIAGNOSIS — I4891 Unspecified atrial fibrillation: Secondary | ICD-10-CM

## 2020-03-01 DIAGNOSIS — I25810 Atherosclerosis of coronary artery bypass graft(s) without angina pectoris: Secondary | ICD-10-CM | POA: Diagnosis not present

## 2020-03-01 DIAGNOSIS — R0602 Shortness of breath: Secondary | ICD-10-CM | POA: Diagnosis not present

## 2020-03-01 DIAGNOSIS — I5031 Acute diastolic (congestive) heart failure: Secondary | ICD-10-CM | POA: Diagnosis not present

## 2020-03-01 HISTORY — PX: TEE WITHOUT CARDIOVERSION: SHX5443

## 2020-03-01 HISTORY — PX: CARDIOVERSION: SHX1299

## 2020-03-01 LAB — PROTIME-INR
INR: 2.2 — ABNORMAL HIGH (ref 0.8–1.2)
Prothrombin Time: 23.4 seconds — ABNORMAL HIGH (ref 11.4–15.2)

## 2020-03-01 LAB — COMPREHENSIVE METABOLIC PANEL
ALT: <5 U/L (ref 0–44)
AST: 20 U/L (ref 15–41)
Albumin: 2.5 g/dL — ABNORMAL LOW (ref 3.5–5.0)
Alkaline Phosphatase: 100 U/L (ref 38–126)
Anion gap: 12 (ref 5–15)
BUN: 20 mg/dL (ref 8–23)
CO2: 27 mmol/L (ref 22–32)
Calcium: 9.2 mg/dL (ref 8.9–10.3)
Chloride: 101 mmol/L (ref 98–111)
Creatinine, Ser: 1.28 mg/dL — ABNORMAL HIGH (ref 0.61–1.24)
GFR, Estimated: 58 mL/min — ABNORMAL LOW (ref >60)
Glucose, Bld: 109 mg/dL — ABNORMAL HIGH (ref 70–99)
Potassium: 3.4 mmol/L — ABNORMAL LOW (ref 3.5–5.1)
Sodium: 140 mmol/L (ref 135–145)
Total Bilirubin: 1.1 mg/dL (ref 0.3–1.2)
Total Protein: 5.8 g/dL — ABNORMAL LOW (ref 6.5–8.1)

## 2020-03-01 LAB — CBC
HCT: 35.3 % — ABNORMAL LOW (ref 39.0–52.0)
Hemoglobin: 12.1 g/dL — ABNORMAL LOW (ref 13.0–17.0)
MCH: 28.9 pg (ref 26.0–34.0)
MCHC: 34.3 g/dL (ref 30.0–36.0)
MCV: 84.4 fL (ref 80.0–100.0)
Platelets: 160 10*3/uL (ref 150–400)
RBC: 4.18 MIL/uL — ABNORMAL LOW (ref 4.22–5.81)
RDW: 14.4 % (ref 11.5–15.5)
WBC: 16 10*3/uL — ABNORMAL HIGH (ref 4.0–10.5)
nRBC: 0 % (ref 0.0–0.2)

## 2020-03-01 LAB — URINALYSIS, ROUTINE W REFLEX MICROSCOPIC
Bilirubin Urine: NEGATIVE
Glucose, UA: NEGATIVE mg/dL
Ketones, ur: 5 mg/dL — AB
Nitrite: NEGATIVE
Protein, ur: 30 mg/dL — AB
RBC / HPF: >50 RBC/hpf — ABNORMAL HIGH (ref 0–5)
Specific Gravity, Urine: 1.02 (ref 1.005–1.030)
WBC, UA: >50 WBC/hpf — ABNORMAL HIGH (ref 0–5)
pH: 5 (ref 5.0–8.0)

## 2020-03-01 LAB — MAGNESIUM: Magnesium: 1.4 mg/dL — ABNORMAL LOW (ref 1.7–2.4)

## 2020-03-01 LAB — ECHO TEE
MV M vel: 5.89 m/s
MV Peak grad: 138.8 mmHg
Radius: 0.6 cm

## 2020-03-01 LAB — GLUCOSE, CAPILLARY: Glucose-Capillary: 101 mg/dL — ABNORMAL HIGH (ref 70–99)

## 2020-03-01 LAB — PHOSPHORUS: Phosphorus: 3.8 mg/dL (ref 2.5–4.6)

## 2020-03-01 SURGERY — ECHOCARDIOGRAM, TRANSESOPHAGEAL
Anesthesia: General

## 2020-03-01 MED ORDER — POTASSIUM CHLORIDE 10 MEQ/100ML IV SOLN
10.0000 meq | INTRAVENOUS | Status: AC
  Administered 2020-03-01 (×2): 10 meq via INTRAVENOUS
  Filled 2020-03-01: qty 100

## 2020-03-01 MED ORDER — MAGNESIUM SULFATE 2 GM/50ML IV SOLN
2.0000 g | Freq: Once | INTRAVENOUS | Status: AC
  Administered 2020-03-01: 2 g via INTRAVENOUS
  Filled 2020-03-01: qty 50

## 2020-03-01 MED ORDER — SODIUM CHLORIDE 0.9 % IV SOLN: INTRAVENOUS | Status: DC

## 2020-03-01 MED ORDER — BUTAMBEN-TETRACAINE-BENZOCAINE 2-2-14 % EX AERO
INHALATION_SPRAY | CUTANEOUS | Status: DC | PRN
  Administered 2020-03-01: 2 via TOPICAL

## 2020-03-01 MED ORDER — PROPOFOL 10 MG/ML IV BOLUS
INTRAVENOUS | Status: DC | PRN
  Administered 2020-03-01 (×2): 10 mg via INTRAVENOUS

## 2020-03-01 MED ORDER — SODIUM CHLORIDE 0.9 % IV SOLN: INTRAVENOUS | Status: DC | PRN

## 2020-03-01 MED ORDER — PROPOFOL 500 MG/50ML IV EMUL
INTRAVENOUS | Status: DC | PRN
  Administered 2020-03-01: 100 ug/kg/min via INTRAVENOUS

## 2020-03-01 NOTE — Progress Notes (Signed)
  Echocardiogram Echocardiogram Transesophageal has been performed.  Brandon Rojas 03/01/2020, 12:05 PM

## 2020-03-01 NOTE — Interval H&P Note (Signed)
History and Physical Interval Note:  03/01/2020 10:36 AM  Brandon Rojas  has presented today for surgery, with the diagnosis of AFIB.  The various methods of treatment have been discussed with the patient and family. After consideration of risks, benefits and other options for treatment, the patient has consented to  Procedure(s): TRANSESOPHAGEAL ECHOCARDIOGRAM (TEE) (N/A) CARDIOVERSION (N/A) as a surgical intervention.  The patient's history has been reviewed, patient examined, no change in status, stable for surgery.  I have reviewed the patient's chart and labs.  Questions were answered to the patient's satisfaction.     Pixie Casino

## 2020-03-01 NOTE — Telephone Encounter (Signed)
Patient's wife sent email regarding diagnosis  My husband is now at [facility] and today I was talking to a doctor about his procedure that will be done tomorrow and he said that according to your test files that my husband has a lung disease that has made him weaker. I never knew about a lung disease that you had found. Please let me know what kind of disease that you have found and not told me about. The breathing issue that he had was Cardiac Edema and now he is in the hospital  with AFIB and they are doing a procedure tomorrow. Just thought you'd want to know. His legs and feet, and ankles were full of fluid, that's why he could not breathe good and get around good. He will not need the Physical Therapy that you said he would need right now.  Maybe in time but not now.   Sending to Dr. Lamonte Sakai for further recommendations

## 2020-03-01 NOTE — CV Procedure (Signed)
TEE/CARDIOVERSION NOTE  TRANSESOPHAGEAL ECHOCARDIOGRAM (TEE):  Indictation: Atrial Fibrillation  Consent:   Informed consent was obtained prior to the procedure. The risks, benefits and alternatives for the procedure were discussed and the patient comprehended these risks.  Risks include, but are not limited to, cough, sore throat, vomiting, nausea, somnolence, esophageal and stomach trauma or perforation, bleeding, low blood pressure, aspiration, pneumonia, infection, trauma to the teeth and death.    Time Out: Verified patient identification, verified procedure, site/side was marked, verified correct patient position, special equipment/implants available, medications/allergies/relevent history reviewed, required imaging and test results available. Performed  Procedure:  After a procedural time-out, the patient was given propofol per anesthesia for sedation. The patient's heart rate, blood pressure, and oxygen saturation are monitored continuously during the procedure. The oropharynx was anesthetized with topica cetacaine.  The transesophageal probe was inserted in the esophagus and stomach without difficulty and multiple views were obtained. Agitated microbubble saline contrast was not administered.  Complications:    Complications: None Patient did tolerate procedure well.  Findings:  1. LEFT VENTRICLE: The left ventricular wall thickness is mildly increased.  The left ventricular cavity is normal in size. Wall motion is normal.  LVEF is 60-65%.  2. RIGHT VENTRICLE:  The right ventricle is normal in structure and function without any thrombus or masses.    3. LEFT ATRIUM:  The left atrium is moderately dilated in size without any thrombus or masses.  There is not spontaneous echo contrast ("smoke") in the left atrium consistent with a low flow state.  4. LEFT ATRIAL APPENDAGE:  The left atrial appendage is free of any thrombus or masses. The appendage has single lobes. Pulse  doppler indicates low flow in the appendage.  5. ATRIAL SEPTUM:  The atrial septum appears intact and is free of thrombus and/or masses.  There is no evidence for interatrial shunting by color doppler and saline microbubble.  6. RIGHT ATRIUM:  The right atrium is severely dilated in size and function without any thrombus or masses.  7. MITRAL VALVE:  The mitral valve is normal in structure and function with moderate regurgitation.  There were no vegetations or stenosis.  8. AORTIC VALVE:  The aortic valve is trileaflet, normal in structure and function with no regurgitation.  There were no vegetations or stenosis  9. TRICUSPID VALVE:  The tricuspid valve is normal in structure and function with moderate to severe regurgitation.  There were no vegetations or stenosis  10.  PULMONIC VALVE:  The pulmonic valve is normal in structure and function with Moderate regurgitation.  There were no vegetations or stenosis.   11. AORTIC ARCH, ASCENDING AND DESCENDING AORTA:  There was grade 1 Ron Parker et. Al, 1992) atherosclerosis of the ascending aorta, aortic arch, or proximal descending aorta.  12. PULMONARY VEINS: Anomalous pulmonary venous return was not noted.  13. PERICARDIUM: The pericardium appeared normal and non-thickened.  There is no pericardial effusion.  CARDIOVERSION:     Second Time Out: Verified patient identification, verified procedure, site/side was marked, verified correct patient position, special equipment/implants available, medications/allergies/relevent history reviewed, required imaging and test results available.  Performed  Procedure:  1. Patient placed on cardiac monitor, pulse oximetry, supplemental oxygen as necessary.  2. Sedation administered per anesthesia 3. Pacer pads placed anterior and posterior chest. 4. Cardioverted 1 time(s).  5. Cardioverted at 150J biphasic.  Complications:  Complications: None Patient did tolerate procedure well.  Impression:  1. No  LAA thrombus 2. Negative for PFO by color  doppler 3. Moderate MR 4. Moderate to severe TR 5. Moderate left and severe right atrial enlaregment 6. Successful DCCV with a single 150J biphasic shock  Recommendations:  1.  Further management per inpatient cardiology service.  Time Spent Directly with the Patient:  60 minutes   Pixie Casino, MD, [facility], [place] Director of the Advanced Lipid Disorders &  Cardiovascular Risk Reduction Clinic Diplomate of the American Board of Clinical Lipidology Attending Cardiologist  Direct Dial: [phone]  Fax: [phone]  Website:  [email or web address] 03/01/2020, 11:39 AM

## 2020-03-01 NOTE — Progress Notes (Signed)
PROGRESS NOTE    Brandon Rojas  ASN:[id number] DOB: [birth year] DOA: 02/26/2020 PCP: Biagio Borg, MD   Brief Narrative:  Patient is a 75 years old male with past medical history of Parkinson's disease, coronary artery disease status post CABG, history of TIA presented to hospital with dyspnea and shortness of breath with generalized debility and functional decline for several months with 2 weeks history of worsening shortness of breath and weight gain.  Patient was also confused on presentation with some hallucinations.  Patient was then admitted to hospital for new onset atrial fibrillation, acute diastolic heart failure exacerbation with severe tricuspid valve regurgitation.  Cardiology was consulted and plan was DC cardioversion.  Assessment & Plan:   Principal Problem:   Atrial fibrillation, new onset ([place]) Active Problems:   Essential hypertension   History of TIA (transient ischemic attack)   Peripheral edema   Parkinson's disease (HCC)   Elevated troponin   CAD (coronary artery disease) of artery bypass graft   Type 2 diabetes mellitus with complication, without long-term current use of insulin (HCC)   Dyspnea   Acute diastolic CHF (congestive heart failure) ([place])  New onset paroxysmal atrial fibrillation. Cardiology on board. Cardiology planning for TEE with DCCV today.  Acute diastolic Heart failure, Severe tricuspid Valve regurgitation;  Continue IV Lasix twice a day.  Cardiology on board.  Continue strict intake and output charting.  Daily weights.    Acute metabolic encephalopathy, cognitive difficulties, visual hallucination: Likely secondary to metabolic issues including heart failure.  MRI was negative for acute stroke.  Seen by neurology.  Continue to monitor closely.  Currently likely at baseline  Elevation of troponin: Likely demand ischemia.  No chest pain or acute ischemic changes in the EKG.  Parkinson disease associated with dementia.  continue with sinemet.   Continue supportive care.  history of TIA: Continue aspirin, Plavix and statins   Oral thrush; continue nystatin.  DVT prophylaxis: Eliquis  Code Status:  Full code  Family Communication:  Spoke with the patient's wife at bedside.  Disposition Plan:  Status is: Inpatient  Remains inpatient appropriate because:IV treatments appropriate due to intensity of illness or inability to take PO, plan for cardioversion today.  Dispo: The patient is from: Home              Anticipated d/c is to: Home PT evaluation after cardioversion.              Anticipated d/c date is: 1 to 2 days              Patient currently is not medically stable to d/c.   Difficult to place patient No   Consultants:   Cardiology  Neurology   Procedures:   None yet  Antimicrobials:  None  Subjective: Today, patient was seen and examined at bedside.  Patient denies any chest pain, dizziness, lightheadedness, fever or chills  Objective: Vitals:   03/01/20 0336 03/01/20 0948 03/01/20 1015 03/01/20 1030  BP: 136/79 (!) 136/92 127/80 (!) 153/79  Pulse: 69  63   Resp: 17  17 (!) 23  Temp: 99.8 F (37.7 C)  99 F (37.2 C) 99.7 F (37.6 C)  TempSrc: Oral  Oral Oral  SpO2: 100%  96% 98%  Weight: 68.2 kg     Height:        Intake/Output Summary (Last 24 hours) at 03/01/2020 1131 Last data filed at 03/01/2020 0358 Gross per 24 hour  Intake 240 ml  Output 1400 ml  Net -1160 ml   Filed Weights   02/28/20 0425 02/29/20 0507 03/01/20 0336  Weight: 72.7 kg 72 kg 68.2 kg   Body mass index is 25.81 kg/m.   Physical examination: General:  Average built, not in obvious distress, alert awake communicative HENT:   No scleral pallor or icterus noted. Oral mucosa is moist.  Chest:   Diminished breath sounds bilaterally.  CVS: S1 &S2 heard. No murmur.   CABG scar noted. Abdomen: Soft, nontender, nondistended.  Bowel sounds are heard.   Extremities: No cyanosis, clubbing with lower extremity edema,  peripheral pulses are palpable. Psych: Alert, awake and oriented, normal mood CNS:  No cranial nerve deficits.  Moves all extremities. Skin: Warm and dry.  No rashes noted.   Data Reviewed: I have personally reviewed following labs and imaging studies  CBC: Recent Labs  Lab 02/26/20 1003 02/27/20 0356 02/29/20 0207 03/01/20 0015  WBC 8.1 6.6 10.0 16.0*  HGB 12.3* 12.3* 12.2* 12.1*  HCT 37.4* 36.4* 36.4* 35.3*  MCV 87.8 86.1 85.4 84.4  PLT 118* 134* 165 268   Basic Metabolic Panel: Recent Labs  Lab 02/26/20 1003 02/27/20 0356 02/28/20 1324 02/29/20 0207 03/01/20 0015  NA 138 139 138 140 140  K 4.0 3.8 3.6 4.0 3.4*  CL 106 107 106 103 101  CO2 20* 23 24 22 27   GLUCOSE 121* 97 163* 108* 109*  BUN 17 15 18 22 20   CREATININE 1.05 1.08 1.13 1.23 1.28*  CALCIUM 9.2 9.2 9.0 9.3 9.2  MG  --   --   --   --  1.4*  PHOS  --   --   --   --  3.8   GFR: Estimated Creatinine Clearance: 41.8 mL/min (A) (by C-G formula based on SCr of 1.28 mg/dL (H)). Liver Function Tests: Recent Labs  Lab 02/26/20 1003 03/01/20 0015  AST 25 20  ALT 5 <5  ALKPHOS 106 100  BILITOT 0.9 1.1  PROT 6.4* 5.8*  ALBUMIN 3.1* 2.5*   No results for input(s): LIPASE, AMYLASE in the last 168 hours. No results for input(s): AMMONIA in the last 168 hours. Coagulation Profile: Recent Labs  Lab 02/27/20 0404 03/01/20 0015  INR 1.1 2.2*   Cardiac Enzymes: No results for input(s): CKTOTAL, CKMB, CKMBINDEX, TROPONINI in the last 168 hours. BNP (last 3 results) No results for input(s): PROBNP in the last 8760 hours. HbA1C: No results for input(s): HGBA1C in the last 72 hours. CBG: Recent Labs  Lab 02/29/20 0802 02/29/20 1126 02/29/20 1557 02/29/20 2102 03/01/20 0734  GLUCAP 100* 112* 109* 100* 101*   Lipid Profile: No results for input(s): CHOL, HDL, LDLCALC, TRIG, CHOLHDL, LDLDIRECT in the last 72 hours. Thyroid Function Tests: No results for input(s): TSH, T4TOTAL, FREET4, T3FREE,  THYROIDAB in the last 72 hours. Anemia Panel: No results for input(s): VITAMINB12, FOLATE, FERRITIN, TIBC, IRON, RETICCTPCT in the last 72 hours. Sepsis Labs: No results for input(s): PROCALCITON, LATICACIDVEN in the last 168 hours.  Recent Results (from the past 240 hour(s))  Resp Panel by RT-PCR (Flu A&B, Covid) Nasopharyngeal Swab     Status: None   Collection Time: 02/26/20 10:55 AM   Specimen: Nasopharyngeal Swab; Nasopharyngeal(NP) swabs in vial transport medium  Result Value Ref Range Status   SARS Coronavirus 2 by RT PCR NEGATIVE NEGATIVE Final    Comment: (NOTE) SARS-CoV-2 target nucleic acids are NOT DETECTED.  The SARS-CoV-2 RNA is generally detectable in upper respiratory specimens during the acute phase of infection.  The lowest concentration of SARS-CoV-2 viral copies this assay can detect is 138 copies/mL. A negative result does not preclude SARS-Cov-2 infection and should not be used as the sole basis for treatment or other patient management decisions. A negative result may occur with  improper specimen collection/handling, submission of specimen other than nasopharyngeal swab, presence of viral mutation(s) within the areas targeted by this assay, and inadequate number of viral copies(<138 copies/mL). A negative result must be combined with clinical observations, patient history, and epidemiological information. The expected result is Negative.  Fact Sheet for Patients:  EntrepreneurPulse.com.au  Fact Sheet for Healthcare Providers:  IncredibleEmployment.be  This test is no t yet approved or cleared by the [place] FDA and  has been authorized for detection and/or diagnosis of SARS-CoV-2 by FDA under an Emergency Use Authorization (EUA). This EUA will remain  in effect (meaning this test can be used) for the duration of the COVID-19 declaration under Section 564(b)(1) of the Act, 21 U.S.C.section 360bbb-3(b)(1), unless the  authorization is terminated  or revoked sooner.       Influenza A by PCR NEGATIVE NEGATIVE Final   Influenza B by PCR NEGATIVE NEGATIVE Final    Comment: (NOTE) The Xpert Xpress SARS-CoV-2/FLU/RSV plus assay is intended as an aid in the diagnosis of influenza from Nasopharyngeal swab specimens and should not be used as a sole basis for treatment. Nasal washings and aspirates are unacceptable for Xpert Xpress SARS-CoV-2/FLU/RSV testing.  Fact Sheet for Patients: EntrepreneurPulse.com.au  Fact Sheet for Healthcare Providers: IncredibleEmployment.be  This test is not yet approved or cleared by the [place] FDA and has been authorized for detection and/or diagnosis of SARS-CoV-2 by FDA under an Emergency Use Authorization (EUA). This EUA will remain in effect (meaning this test can be used) for the duration of the COVID-19 declaration under Section 564(b)(1) of the Act, 21 U.S.C. section 360bbb-3(b)(1), unless the authorization is terminated or revoked.  Performed at [facility], [place] [address] [place], [place] [address]       Radiology Studies: DG CHEST PORT 1 VIEW  Result Date: 03/01/2020 CLINICAL DATA:  Shortness of breath. EXAM: PORTABLE CHEST 1 VIEW COMPARISON:  February 26, 2020 FINDINGS: Multiple sternal wires and vascular clips are seen. Appearing increased lung markings are seen. Mild to moderate severity atelectasis is seen within the left lung base. The silhouette is markedly enlarged. There is a large hiatal hernia. The visualized skeletal structures are unremarkable. IMPRESSION: 1. Cardiomegaly with mild to moderate severity left basilar atelectasis. 2. Large hiatal hernia. Electronically Signed   By: Virgina Norfolk M.D.   On: 03/01/2020 00:18       Scheduled Meds: . [MAR Hold] apixaban  5 mg Oral BID  . [MAR Hold] aspirin EC  81 mg Oral Daily  . [MAR Hold] carbidopa-levodopa  2 tablet Oral TID  . [MAR Hold]  furosemide  40 mg Intravenous BID  . [MAR Hold] hydrALAZINE  25 mg Oral Q8H  . [MAR Hold] nystatin  5 mL Oral QID  . [MAR Hold] pantoprazole  80 mg Oral Q1200  . [MAR Hold] pravastatin  20 mg Oral q1800  . [MAR Hold] sodium chloride flush  3 mL Intravenous Q12H  . [MAR Hold] triamcinolone   Topical BID   Continuous Infusions: . [MAR Hold] sodium chloride    . sodium chloride 20 mL/hr at 02/27/20 2153  . sodium chloride 20 mL/hr at 03/01/20 0219     LOS: 4 days   [employer],  MD [facility]   If 7PM-7AM, please contact night-coverage [email or web address]  03/01/2020, 11:31 AM

## 2020-03-01 NOTE — Anesthesia Preprocedure Evaluation (Addendum)
Anesthesia Evaluation  Patient identified by MRN, date of birth, ID band  Reviewed: Allergy & Precautions, NPO status , Patient's Chart, lab work & pertinent test results  Airway Mallampati: II  TM Distance: >3 FB Neck ROM: Full    Dental  (+) Edentulous Upper, Edentulous Lower   Pulmonary former smoker,    Pulmonary exam normal        Cardiovascular hypertension, + CAD, + CABG and +CHF  + dysrhythmias Atrial Fibrillation + Valvular Problems/Murmurs MR  Rhythm:Irregular Rate:Abnormal     Neuro/Psych PSYCHIATRIC DISORDERS Anxiety Depression TIACVA    GI/Hepatic Neg liver ROS, hiatal hernia, GERD  ,  Endo/Other  diabetes  Renal/GU negative Renal ROS  negative genitourinary   Musculoskeletal  (+) Arthritis ,   Abdominal Normal abdominal exam  (+)   Peds  Hematology negative hematology ROS (+)   Anesthesia Other Findings   Reproductive/Obstetrics                            Anesthesia Physical Anesthesia Plan  ASA: III  Anesthesia Plan: General   Post-op Pain Management:    Induction: Intravenous  PONV Risk Score and Plan: 0 and Propofol infusion  Airway Management Planned: Simple Face Mask and Natural Airway  Additional Equipment: None  Intra-op Plan:   Post-operative Plan:   Informed Consent: I have reviewed the patients History and Physical, chart, labs and discussed the procedure including the risks, benefits and alternatives for the proposed anesthesia with the patient or authorized representative who has indicated his/her understanding and acceptance.       Plan Discussed with: CRNA  Anesthesia Plan Comments: (Attempted to call Wife x2  Echo:  1. Left ventricular ejection fraction, by estimation, is 60 to 65%. The  left ventricle has normal function. The left ventricle has no regional  wall motion abnormalities. There is mild left ventricular hypertrophy.  Left  ventricular diastolic parameters  are indeterminate. There is the interventricular septum is flattened in  systole and diastole, consistent with right ventricular pressure and  volume overload.  2. Right ventricular systolic function is moderately reduced. The right  ventricular size is moderately enlarged. There is normal pulmonary artery  systolic pressure. The estimated right ventricular systolic pressure is  63.8 mmHg.  3. Left atrial size was moderately dilated.  4. Right atrial size was severely dilated.  5. The mitral valve is normal in structure. Mild mitral valve  regurgitation. No evidence of mitral stenosis.  6. Tricuspid valve regurgitation is severe.  7. The aortic valve is grossly normal. Aortic valve regurgitation is not  visualized. Mild aortic valve sclerosis is present, with no evidence of  aortic valve stenosis.  8. Pulmonic valve regurgitation is moderate.  9. The inferior vena cava is dilated in size with <50% respiratory  variability, suggesting right atrial pressure of 15 mmHg. )       Anesthesia Quick Evaluation

## 2020-03-01 NOTE — Anesthesia Postprocedure Evaluation (Signed)
Anesthesia Post Note  Patient: Brandon Rojas  Procedure(s) Performed: TRANSESOPHAGEAL ECHOCARDIOGRAM (TEE) (N/A ) CARDIOVERSION (N/A )     Patient location during evaluation: PACU Anesthesia Type: General Level of consciousness: awake and alert Pain management: pain level controlled Vital Signs Assessment: post-procedure vital signs reviewed and stable Respiratory status: spontaneous breathing, nonlabored ventilation, respiratory function stable and patient connected to nasal cannula oxygen Cardiovascular status: blood pressure returned to baseline and stable Postop Assessment: no apparent nausea or vomiting Anesthetic complications: no   No complications documented.  Last Vitals:  Vitals:   03/01/20 1153 03/01/20 1159  BP: 133/72 137/70  Pulse: 66 66  Resp: 17 19  Temp:    SpO2: 90% 92%    Last Pain:  Vitals:   03/01/20 1159  TempSrc:   PainSc: 0-No pain                 Effie Berkshire

## 2020-03-01 NOTE — Significant Event (Signed)
Rapid Response Event Note   Reason for Call :  Frequent PVCs, NSVT, lethargy  Initial Focused Assessment:  Pt lying in bed with eyes closed, in no distress.  Pt will respond to repeated verbal stimulation, and is oriented to person. He will follow commands and move all extremities equally. He denies chest pain/SOB. Lungs are clear and diminished(L>R). Murmur present. +JVD. Pt with BLE pitting edema. Skin is warm and slightly diaphoretic.   T-99.2 , HR-65, BP-102/87, RR-20, SpO2-97% on 2l [place]   Interventions:  EKG-Atrial fibrillation, Right bundle branch block, Left posterior fascicular block, Bifascicular block,T wave abnormality BMP/MG PCXR-1. Cardiomegaly with mild to moderate severity left basilar atelectasis. 2. Large hiatal hernia. CBG-100  Plan of Care:  Await lab/PCR results and notify MD of abnormalities. Continue to monitor pt closely. Call RRT if further assistance needed.    Event Summary:   MD Notified: RN to notify Call Mifflin, Orlene Salmons Anderson, RN

## 2020-03-01 NOTE — Care Management (Signed)
1141 03-01-20 Benefits check submitted for Eliquis. Case Manager will follow for cost. Graves-Bigelow, Ocie Cornfield, RN,BSN Case Manager

## 2020-03-01 NOTE — Progress Notes (Signed)
Progress Note  Patient Name: Brandon Rojas Date of Encounter: 03/01/2020  [facility] Cardiologist: Minus Breeding, MD   Subjective   75 year old gentleman with a history of coronary artery disease, CABG, mitral regurgitation, tricuspid regurgitation, hypertension, hyperlipidemia.  He also has Parkinson's disease.  He was seen for further evaluation of congestive heart failure and atrial fibrillation.  He has diuresed 2.9 liters so far this admission   His ventricular rate is well controlled.  Scheduled for TEE / CV today     Inpatient Medications    Scheduled Meds: . apixaban  5 mg Oral BID  . aspirin EC  81 mg Oral Daily  . carbidopa-levodopa  2 tablet Oral TID  . furosemide  40 mg Intravenous BID  . hydrALAZINE  25 mg Oral Q8H  . nystatin  5 mL Oral QID  . pantoprazole  80 mg Oral Q1200  . pravastatin  20 mg Oral q1800  . sodium chloride flush  3 mL Intravenous Q12H  . triamcinolone   Topical BID   Continuous Infusions: . sodium chloride    . sodium chloride 20 mL/hr at 02/27/20 2153  . sodium chloride 20 mL/hr at 03/01/20 0219   PRN Meds: sodium chloride, acetaminophen **OR** acetaminophen, albuterol, nitroGLYCERIN, polyethylene glycol, sodium chloride flush   Vital Signs    Vitals:   03/01/20 0025 03/01/20 0028 03/01/20 0336 03/01/20 0948  BP:  116/82 136/79 (!) 136/92  Pulse:  62 69   Resp:  15 17   Temp: 99.2 F (37.3 C)  99.8 F (37.7 C)   TempSrc: Oral  Oral   SpO2:  99% 100%   Weight:   68.2 kg   Height:        Intake/Output Summary (Last 24 hours) at 03/01/2020 0956 Last data filed at 03/01/2020 0358 Gross per 24 hour  Intake 440.3 ml  Output 1400 ml  Net -959.7 ml   Last 3 Weights 03/01/2020 02/29/2020 02/28/2020  Weight (lbs) 150 lb 5.7 oz 158 lb 11.7 oz 160 lb 3.2 oz  Weight (kg) 68.2 kg 72 kg 72.666 kg      Telemetry    Afib with controlled V   - Personally Reviewed  ECG     - Personally Reviewed  Physical Exam    Physical  Exam: Blood pressure (!) 136/92, pulse 69, temperature 99.8 F (37.7 C), temperature source Oral, resp. rate 17, height 5\' 4"  (1.626 m), weight 68.2 kg, SpO2 100 %.  GEN:  Elderly male, flat affect  HEENT: Normal NECK: No JVD; No carotid bruits LYMPHATICS: No lymphadenopathy CARDIAC: irreg. Irreg. , soft systolic murmur  RESPIRATORY:  Clear to auscultation without rales, wheezing or rhonchi  ABDOMEN: Soft, non-tender, non-distended MUSCULOSKELETAL:  1+ edema  SKIN: Warm and dry NEUROLOGIC:  Alert and oriented x 3   Labs    High Sensitivity Troponin:   Recent Labs  Lab 02/26/20 1003 02/26/20 1249  TROPONINIHS 44* 44*      Chemistry Recent Labs  Lab 02/26/20 1003 02/27/20 0356 02/28/20 1324 02/29/20 0207 03/01/20 0015  NA 138   < > 138 140 140  K 4.0   < > 3.6 4.0 3.4*  CL 106   < > 106 103 101  CO2 20*   < > 24 22 27   GLUCOSE 121*   < > 163* 108* 109*  BUN 17   < > 18 22 20   CREATININE 1.05   < > 1.13 1.23 1.28*  CALCIUM 9.2   < >  9.0 9.3 9.2  PROT 6.4*  --   --   --  5.8*  ALBUMIN 3.1*  --   --   --  2.5*  AST 25  --   --   --  20  ALT 5  --   --   --  <5  ALKPHOS 106  --   --   --  100  BILITOT 0.9  --   --   --  1.1  GFRNONAA >60   < > >60 >60 58*  ANIONGAP 12   < > 8 15 12    < > = values in this interval not displayed.     Hematology Recent Labs  Lab 02/27/20 0356 02/29/20 0207 03/01/20 0015  WBC 6.6 10.0 16.0*  RBC 4.23 4.26 4.18*  HGB 12.3* 12.2* 12.1*  HCT 36.4* 36.4* 35.3*  MCV 86.1 85.4 84.4  MCH 29.1 28.6 28.9  MCHC 33.8 33.5 34.3  RDW 14.3 14.2 14.4  PLT 134* 165 160    BNP Recent Labs  Lab 02/26/20 1014  BNP 598.7*     DDimer No results for input(s): DDIMER in the last 168 hours.   Radiology    DG CHEST PORT 1 VIEW  Result Date: 03/01/2020 CLINICAL DATA:  Shortness of breath. EXAM: PORTABLE CHEST 1 VIEW COMPARISON:  February 26, 2020 FINDINGS: Multiple sternal wires and vascular clips are seen. Appearing increased lung  markings are seen. Mild to moderate severity atelectasis is seen within the left lung base. The silhouette is markedly enlarged. There is a large hiatal hernia. The visualized skeletal structures are unremarkable. IMPRESSION: 1. Cardiomegaly with mild to moderate severity left basilar atelectasis. 2. Large hiatal hernia. Electronically Signed   By: Virgina Norfolk M.D.   On: 03/01/2020 00:18    Cardiac Studies     Patient Profile     75 y.o. male with parkinsons Atrial fib with controlled V response   Assessment & Plan    1. Atrial fib with controlled. V response:  Mr. Wadhwa presents since with symptoms of congestive heart failure.  His ejection fraction is 60 to 65%. He is scheduled for TEE CV.   This may help with his CHF symptoms  Cont eliquis   2.  Acute congestive heart failure: The patient is found severe tricuspid regurgitation with evidence of RV pressure and volume overload. Cont diuretics   For questions or updates, please contact [place] Please consult [email or web address] for contact info under      Signed, Mertie Moores, MD  03/01/2020, 9:56 AM

## 2020-03-01 NOTE — Transfer of Care (Signed)
Immediate Anesthesia Transfer of Care Note  Patient: ABDUR Rojas  Procedure(s) Performed: TRANSESOPHAGEAL ECHOCARDIOGRAM (TEE) (N/A ) CARDIOVERSION (N/A )  Patient Location: Endoscopy Unit  Anesthesia Type:MAC  Level of Consciousness: drowsy  Airway & Oxygen Therapy: Patient Spontanous Breathing and Patient connected to nasal cannula oxygen  Post-op Assessment: Report given to RN and Post -op Vital signs reviewed and stable  Post vital signs: Reviewed  Last Vitals:  Vitals Value Taken Time  BP 120/59 03/01/20 1143  Temp    Pulse 66 03/01/20 1144  Resp 21 03/01/20 1144  SpO2 93 % 03/01/20 1144  Vitals shown include unvalidated device data.  Last Pain:  Vitals:   03/01/20 1143  TempSrc:   PainSc: 0-No pain      Patients Stated Pain Goal: 0 (25/18/98 4210)  Complications: No complications documented.

## 2020-03-01 NOTE — Progress Notes (Signed)
K+ 3.4, Mag 1.4, and WBC 16.  Dr. Hal Hope notified.  Orders received.  Will continue to monitor.  Jodell Cipro

## 2020-03-01 NOTE — Anesthesia Procedure Notes (Signed)
Procedure Name: MAC Date/Time: 03/01/2020 11:16 AM Performed by: Janene Harvey, CRNA Pre-anesthesia Checklist: Patient identified, Emergency Drugs available, Suction available and Patient being monitored Patient Re-evaluated:Patient Re-evaluated prior to induction Oxygen Delivery Method: Nasal cannula Placement Confirmation: positive ETCO2 Dental Injury: Teeth and Oropharynx as per pre-operative assessment

## 2020-03-01 NOTE — Progress Notes (Signed)
Patient lethargic and difficult to arouse.  8 beats of NSVT noted on telemetry while assessing patient.  Patient's skin is moist and warm to touch.  +JVD noted.  Murmur noted.  Left lung fields sound diminished, and patient exhibits increased WOB at times, but denies SOB or pain.  Unable to reach Dr. Paticia Stack through [facility] text page.  Rapid response nurse, Anselm Pancoast, notified of the above.  Labs and CXR ordered.  EKG obtained.  VSS, except low grade fever.  Will continue to monitor.  Brandon Rojas

## 2020-03-02 ENCOUNTER — Encounter (HOSPITAL_COMMUNITY): Payer: Self-pay | Admitting: Internal Medicine

## 2020-03-02 ENCOUNTER — Other Ambulatory Visit: Payer: Self-pay

## 2020-03-02 ENCOUNTER — Ambulatory Visit: Payer: Medicare HMO | Admitting: Sports Medicine

## 2020-03-02 DIAGNOSIS — I4891 Unspecified atrial fibrillation: Secondary | ICD-10-CM | POA: Diagnosis not present

## 2020-03-02 DIAGNOSIS — I5031 Acute diastolic (congestive) heart failure: Secondary | ICD-10-CM | POA: Diagnosis not present

## 2020-03-02 DIAGNOSIS — I25810 Atherosclerosis of coronary artery bypass graft(s) without angina pectoris: Secondary | ICD-10-CM | POA: Diagnosis not present

## 2020-03-02 DIAGNOSIS — R0602 Shortness of breath: Secondary | ICD-10-CM | POA: Diagnosis not present

## 2020-03-02 LAB — BASIC METABOLIC PANEL
Anion gap: 12 (ref 5–15)
BUN: 26 mg/dL — ABNORMAL HIGH (ref 8–23)
CO2: 24 mmol/L (ref 22–32)
Calcium: 8.8 mg/dL — ABNORMAL LOW (ref 8.9–10.3)
Chloride: 99 mmol/L (ref 98–111)
Creatinine, Ser: 1.42 mg/dL — ABNORMAL HIGH (ref 0.61–1.24)
GFR, Estimated: 52 mL/min — ABNORMAL LOW (ref >60)
Glucose, Bld: 98 mg/dL (ref 70–99)
Potassium: 4 mmol/L (ref 3.5–5.1)
Sodium: 135 mmol/L (ref 135–145)

## 2020-03-02 LAB — MAGNESIUM: Magnesium: 1.9 mg/dL (ref 1.7–2.4)

## 2020-03-02 MED ORDER — TORSEMIDE 20 MG PO TABS
20.0000 mg | ORAL_TABLET | Freq: Two times a day (BID) | ORAL | Status: DC
  Administered 2020-03-02 – 2020-03-03 (×2): 20 mg via ORAL
  Filled 2020-03-02 (×2): qty 1

## 2020-03-02 NOTE — Progress Notes (Signed)
Progress Note  Patient Name: ESTIVEN KOHAN Date of Encounter: 03/02/2020  [facility] Cardiologist: Minus Breeding, MD   Subjective   75 year old gentleman with a history of coronary artery disease, CABG, mitral regurgitation, tricuspid regurgitation, hypertension, hyperlipidemia.  He also has Parkinson's disease.  He was seen for further evaluation of congestive heart failure and atrial fibrillation.  Has diuresed 3.0 liters so far this admission Was cardioverted yesterday .   Is maintaining nsr     Inpatient Medications    Scheduled Meds: . apixaban  5 mg Oral BID  . aspirin EC  81 mg Oral Daily  . carbidopa-levodopa  2 tablet Oral TID  . furosemide  40 mg Intravenous BID  . hydrALAZINE  25 mg Oral Q8H  . nystatin  5 mL Oral QID  . pantoprazole  80 mg Oral Q1200  . pravastatin  20 mg Oral q1800  . sodium chloride flush  3 mL Intravenous Q12H  . triamcinolone   Topical BID   Continuous Infusions: . sodium chloride     PRN Meds: sodium chloride, acetaminophen **OR** acetaminophen, albuterol, nitroGLYCERIN, polyethylene glycol, sodium chloride flush   Vital Signs    Vitals:   03/01/20 2337 03/02/20 0430 03/02/20 0649 03/02/20 0800  BP: 107/70 125/71 132/86 137/80  Pulse: 71 61  71  Resp: 16 16  18   Temp: 98.5 F (36.9 C) 98.6 F (37 C)  98.6 F (37 C)  TempSrc: Oral Oral  Oral  SpO2: 96% 95%  91%  Weight:  69 kg    Height:        Intake/Output Summary (Last 24 hours) at 03/02/2020 0954 Last data filed at 03/02/2020 0432 Gross per 24 hour  Intake 150 ml  Output 300 ml  Net -150 ml   Last 3 Weights 03/02/2020 03/01/2020 02/29/2020  Weight (lbs) 152 lb 1.6 oz 150 lb 5.7 oz 158 lb 11.7 oz  Weight (kg) 68.992 kg 68.2 kg 72 kg      Telemetry    NSR - Personally Reviewed  ECG     - Personally Reviewed  Physical Exam    Physical Exam: Blood pressure 137/80, pulse 71, temperature 98.6 F (37 C), temperature source Oral, resp. rate 18, height 5\' 4"   (1.626 m), weight 69 kg, SpO2 91 %.  GEN:  Elderly frail man,   Examined in the chair .  Very weak  HEENT: Normal NECK: No JVD; No carotid bruits LYMPHATICS: No lymphadenopathy CARDIAC: RRR ,   RESPIRATORY:  Clear to auscultation without rales, wheezing or rhonchi  ABDOMEN: Soft, non-tender, non-distended MUSCULOSKELETAL:   1+ leg edema   SKIN: Warm and dry NEUROLOGIC:  Alert and oriented x 3   Labs    High Sensitivity Troponin:   Recent Labs  Lab 02/26/20 1003 02/26/20 1249  TROPONINIHS 44* 44*      Chemistry Recent Labs  Lab 02/26/20 1003 02/27/20 0356 02/29/20 0207 03/01/20 0015 03/02/20 0210  NA 138   < > 140 140 135  K 4.0   < > 4.0 3.4* 4.0  CL 106   < > 103 101 99  CO2 20*   < > 22 27 24   GLUCOSE 121*   < > 108* 109* 98  BUN 17   < > 22 20 26*  CREATININE 1.05   < > 1.23 1.28* 1.42*  CALCIUM 9.2   < > 9.3 9.2 8.8*  PROT 6.4*  --   --  5.8*  --   ALBUMIN  3.1*  --   --  2.5*  --   AST 25  --   --  20  --   ALT 5  --   --  <5  --   ALKPHOS 106  --   --  100  --   BILITOT 0.9  --   --  1.1  --   GFRNONAA >60   < > >60 58* 52*  ANIONGAP 12   < > 15 12 12    < > = values in this interval not displayed.     Hematology Recent Labs  Lab 02/27/20 0356 02/29/20 0207 03/01/20 0015  WBC 6.6 10.0 16.0*  RBC 4.23 4.26 4.18*  HGB 12.3* 12.2* 12.1*  HCT 36.4* 36.4* 35.3*  MCV 86.1 85.4 84.4  MCH 29.1 28.6 28.9  MCHC 33.8 33.5 34.3  RDW 14.3 14.2 14.4  PLT 134* 165 160    BNP Recent Labs  Lab 02/26/20 1014  BNP 598.7*     DDimer No results for input(s): DDIMER in the last 168 hours.   Radiology    DG CHEST PORT 1 VIEW  Result Date: 03/01/2020 CLINICAL DATA:  Shortness of breath. EXAM: PORTABLE CHEST 1 VIEW COMPARISON:  February 26, 2020 FINDINGS: Multiple sternal wires and vascular clips are seen. Appearing increased lung markings are seen. Mild to moderate severity atelectasis is seen within the left lung base. The silhouette is markedly enlarged.  There is a large hiatal hernia. The visualized skeletal structures are unremarkable. IMPRESSION: 1. Cardiomegaly with mild to moderate severity left basilar atelectasis. 2. Large hiatal hernia. Electronically Signed   By: Virgina Norfolk M.D.   On: 03/01/2020 00:18   ECHO TEE  Result Date: 03/01/2020    TRANSESOPHOGEAL ECHO REPORT   Patient Name:   JOSTIN RUE Date of Exam: 03/01/2020 Medical Rec #:  [id number]     Height:       64.0 in Accession #:    [id number]    Weight:       150.4 lb Date of Birth:  February 22, 1945      BSA:          1.733 m Patient Age:    31 years      BP:           153/79 mmHg Patient Gender: M             HR:           70 bpm. Exam Location:  Inpatient Procedure: Transesophageal Echo Indications:     I48.91* Unspeicified atrial fibrillation  History:         Patient has prior history of Echocardiogram examinations, most                  recent 02/26/2020. CAD, Stroke; Risk Factors:Hypertension and                  Dyslipidemia.  Sonographer:     Bernadene Person RDCS Referring Phys:  [id number] Shanea Karney J Cristino Degroff Diagnosing Phys: Lyman Bishop MD PROCEDURE: After discussion of the risks and benefits of a TEE, an informed consent was obtained from the patient. The transesophogeal probe was passed without difficulty through the esophogus of the patient. Local oropharyngeal anesthetic was provided with Cetacaine. Sedation performed by different physician. The patient was monitored while under deep sedation. Anesthestetic sedation was provided intravenously by Anesthesiology: 132.19mg  of Propofol. The patient's vital signs; including heart rate, blood pressure, and oxygen saturation; remained stable throughout the  procedure. The patient developed no complications during the procedure. A successful direct current cardioversion was performed at 150 joules with 1 attempt. IMPRESSIONS  1. Left ventricular ejection fraction, by estimation, is 60 to 65%. The left ventricle has normal function. There is mild left  ventricular hypertrophy.  2. Right ventricular systolic function is normal. The right ventricular size is normal.  3. Left atrial size was moderately dilated. No left atrial/left atrial appendage thrombus was detected.  4. Right atrial size was severely dilated.  5. The mitral valve is abnormal. Moderate mitral valve regurgitation.  6. The tricuspid valve is abnormal. Tricuspid valve regurgitation is moderate to severe.  7. The aortic valve is tricuspid. Aortic valve regurgitation is not visualized. Conclusion(s)/Recommendation(s): No LA/LAA thrombus identified. Successful cardioversion performed with restoration of normal sinus rhythm. FINDINGS  Left Ventricle: Left ventricular ejection fraction, by estimation, is 60 to 65%. The left ventricle has normal function. The left ventricular internal cavity size was normal in size. There is mild left ventricular hypertrophy. Right Ventricle: The right ventricular size is normal. No increase in right ventricular wall thickness. Right ventricular systolic function is normal. Left Atrium: Left atrial size was moderately dilated. No left atrial/left atrial appendage thrombus was detected. Right Atrium: Right atrial size was severely dilated. Pericardium: There is no evidence of pericardial effusion. Mitral Valve: The mitral valve is abnormal. There is mild thickening of the mitral valve leaflet(s). Moderate mitral valve regurgitation, with centrally-directed jet. Tricuspid Valve: The tricuspid valve is abnormal. Tricuspid valve regurgitation is moderate to severe. Aortic Valve: The aortic valve is tricuspid. Aortic valve regurgitation is not visualized. Pulmonic Valve: The pulmonic valve was not well visualized. Pulmonic valve regurgitation is mild. Aorta: The aortic root and ascending aorta are structurally normal, with no evidence of dilitation. IAS/Shunts: No atrial level shunt detected by color flow Doppler.  MR Peak grad:    138.8 mmHg MR Mean grad:    87.0 mmHg MR Vmax:          589.00 cm/s MR Vmean:        439.0 cm/s MR PISA:         2.26 cm MR PISA Eff ROA: 15 mm MR PISA Radius:  0.60 cm Lyman Bishop MD Electronically signed by Lyman Bishop MD Signature Date/Time: 03/01/2020/3:09:38 PM    Final     Cardiac Studies     Patient Profile     75 y.o. male with parkinsons Atrial fib with controlled V response   Assessment & Plan    1. Atrial fib with controlled. V response:  Mr. Nieblas presents since with symptoms of congestive heart failure.  His ejection fraction is 60 to 65%. Was cardioverted yesterday .  Is still in NSR   2.  Acute congestive heart failure: The patient is found severe tricuspid regurgitation with evidence of RV pressure and volume overload. Continue to diurese  I have changed his IV lasix to PO torsemide. Titrate as needed.    For questions or updates, please contact [place] Please consult [email or web address] for contact info under      Signed, Mertie Moores, MD  03/02/2020, 9:54 AM

## 2020-03-02 NOTE — Telephone Encounter (Signed)
I tried him on albuterol empirically even though he does not have obstructive lung disease on his pulmonary function testing just to see if you get any benefit.  If he is getting side effects including nervousness then I would stop it.  There is not another inhaler that we should turn to and try to use.

## 2020-03-02 NOTE — Progress Notes (Signed)
PROGRESS NOTE    Brandon Rojas  NAT:[id number] DOB: [birth year] DOA: 02/26/2020 PCP: Biagio Borg, MD   Brief Narrative:  Patient is a 75 years old male with past medical history of Parkinson's disease, coronary artery disease status post CABG, history of TIA presented to hospital with dyspnea and shortness of breath with generalized debility and functional decline for several months with 2 weeks history of worsening shortness of breath and weight gain.  Patient was also confused on presentation with some hallucinations.  Patient was then admitted to hospital for new onset atrial fibrillation, acute diastolic heart failure exacerbation with severe tricuspid valve regurgitation.  Cardiology was consulted.  Patient underwent DC cardioversion on 03/01/2020 with conversion to normal sinus rhythm.  He was treated with IV Lasix for diastolic heart failure and has been changed to torsemide.  Patient appears to be extremely weak and debilitated at this time.   Assessment & Plan:   Principal Problem:   Atrial fibrillation, new onset ([place]) Active Problems:   Essential hypertension   History of TIA (transient ischemic attack)   Peripheral edema   Parkinson's disease (HCC)   Elevated troponin   CAD (coronary artery disease) of artery bypass graft   Type 2 diabetes mellitus with complication, without long-term current use of insulin (HCC)   Dyspnea   Acute diastolic CHF (congestive heart failure) ([place])  New onset paroxysmal atrial fibrillation. Cardiology on board.  Status post TEE with DCCV on 2/22 with conversion to NSR.  Acute diastolic Heart failure, Severe tricuspid Valve regurgitation;  Initially received IV Lasix twice a day.  Has been changed to torsemide starting today.  Cardiology on board.  Continue strict intake and output charting.  Daily weights.  Currently on supplemental oxygen.  We will continue to wean as able.  Acute metabolic encephalopathy, cognitive difficulties, visual  hallucination: MRI was negative for acute stroke.  Seen by neurology.  Continue to monitor closely.  Currently likely at baseline.  Alert awake and communicative.  Elevation of troponin: Likely demand ischemia.  No chest pain or acute ischemic changes in the EKG. no further work-up planned.  Parkinson disease associated with dementia.  continue with sinemet.  Continue supportive care.  History of TIA: Continue aspirin, Plavix and statins   Oral thrush; continue nystatin.  Mild leukocytosis.  We will continue to monitor closely.  No signs of infection at this time. could be reactive.  Deconditioning, debility, ambulatory dysfunction.  PT on board.  Recommend continued physical therapy at home, home health PT.  He does not wish for skilled nursing facility.  DVT prophylaxis: Eliquis  Code Status:  Full code  Family Communication:  I spoke with the patient's wife Ms Vaughan Basta on the phone and updated her about the clinical condition of the patient and the plan for disposition.   Disposition Plan:  Status is: Inpatient  Remains inpatient appropriate because:IV treatments appropriate due to intensity of illness or inability to take PO, status post cardioversion, ambulatory dysfunction.  Dispo: The patient is from: Home              Anticipated d/c is to: Home PT               Anticipated d/c date is: likely home by tomorrow if the patient is able to ambulate little more, wean oxygen as able.              Patient currently is not medically stable to d/c.   Difficult to place patient  No   Consultants:   Cardiology  Neurology   Procedures:  TEE with DC cardioversion on 03/01/2020  Antimicrobials:  None  Subjective: Today, patient was seen and examined at bedside.  Patient denies any nausea, vomiting, fever but has mild cough.  Objective: Vitals:   03/01/20 2337 03/02/20 0430 03/02/20 0649 03/02/20 0800  BP: 107/70 125/71 132/86 137/80  Pulse: 71 61  71  Resp: 16 16  18    Temp: 98.5 F (36.9 C) 98.6 F (37 C)  98.6 F (37 C)  TempSrc: Oral Oral  Oral  SpO2: 96% 95%  91%  Weight:  69 kg    Height:        Intake/Output Summary (Last 24 hours) at 03/02/2020 1158 Last data filed at 03/02/2020 0432 Gross per 24 hour  Intake --  Output 300 ml  Net -300 ml   Filed Weights   02/29/20 0507 03/01/20 0336 03/02/20 0430  Weight: 72 kg 68.2 kg 69 kg   Body mass index is 26.11 kg/m.   Physical examination: General:  Average built, not in obvious distress, alert awake and communicative HENT:   No scleral pallor or icterus noted. Oral mucosa is moist.  Chest:  .  Diminished breath sounds bilaterally.  Previous CABG scar. CVS: S1 &S2 heard. No murmur.  Regular rate and rhythm. Abdomen: Soft, nontender, nondistended.  Bowel sounds are heard.   Extremities: No cyanosis, clubbing but with some lower extremity edema..  Peripheral pulses are palpable. Psych: Alert, awake and communicative, normal mood CNS:  No cranial nerve deficits.  Power equal in all extremities.   Skin: Warm and dry.  No rashes noted.  Data Reviewed: I have personally reviewed following labs and imaging studies  CBC: Recent Labs  Lab 02/26/20 1003 02/27/20 0356 02/29/20 0207 03/01/20 0015  WBC 8.1 6.6 10.0 16.0*  HGB 12.3* 12.3* 12.2* 12.1*  HCT 37.4* 36.4* 36.4* 35.3*  MCV 87.8 86.1 85.4 84.4  PLT 118* 134* 165 696   Basic Metabolic Panel: Recent Labs  Lab 02/27/20 0356 02/28/20 1324 02/29/20 0207 03/01/20 0015 03/02/20 0210  NA 139 138 140 140 135  K 3.8 3.6 4.0 3.4* 4.0  CL 107 106 103 101 99  CO2 23 24 22 27 24   GLUCOSE 97 163* 108* 109* 98  BUN 15 18 22 20  26*  CREATININE 1.08 1.13 1.23 1.28* 1.42*  CALCIUM 9.2 9.0 9.3 9.2 8.8*  MG  --   --   --  1.4* 1.9  PHOS  --   --   --  3.8  --    GFR: Estimated Creatinine Clearance: 37.6 mL/min (A) (by C-G formula based on SCr of 1.42 mg/dL (H)). Liver Function Tests: Recent Labs  Lab 02/26/20 1003 03/01/20 0015   AST 25 20  ALT 5 <5  ALKPHOS 106 100  BILITOT 0.9 1.1  PROT 6.4* 5.8*  ALBUMIN 3.1* 2.5*   No results for input(s): LIPASE, AMYLASE in the last 168 hours. No results for input(s): AMMONIA in the last 168 hours. Coagulation Profile: Recent Labs  Lab 02/27/20 0404 03/01/20 0015  INR 1.1 2.2*   Cardiac Enzymes: No results for input(s): CKTOTAL, CKMB, CKMBINDEX, TROPONINI in the last 168 hours. BNP (last 3 results) No results for input(s): PROBNP in the last 8760 hours. HbA1C: No results for input(s): HGBA1C in the last 72 hours. CBG: Recent Labs  Lab 02/29/20 0802 02/29/20 1126 02/29/20 1557 02/29/20 2102 03/01/20 0734  GLUCAP 100* 112* 109* 100* 101*  Lipid Profile: No results for input(s): CHOL, HDL, LDLCALC, TRIG, CHOLHDL, LDLDIRECT in the last 72 hours. Thyroid Function Tests: No results for input(s): TSH, T4TOTAL, FREET4, T3FREE, THYROIDAB in the last 72 hours. Anemia Panel: No results for input(s): VITAMINB12, FOLATE, FERRITIN, TIBC, IRON, RETICCTPCT in the last 72 hours. Sepsis Labs: No results for input(s): PROCALCITON, LATICACIDVEN in the last 168 hours.  Recent Results (from the past 240 hour(s))  Resp Panel by RT-PCR (Flu A&B, Covid) Nasopharyngeal Swab     Status: None   Collection Time: 02/26/20 10:55 AM   Specimen: Nasopharyngeal Swab; Nasopharyngeal(NP) swabs in vial transport medium  Result Value Ref Range Status   SARS Coronavirus 2 by RT PCR NEGATIVE NEGATIVE Final    Comment: (NOTE) SARS-CoV-2 target nucleic acids are NOT DETECTED.  The SARS-CoV-2 RNA is generally detectable in upper respiratory specimens during the acute phase of infection. The lowest concentration of SARS-CoV-2 viral copies this assay can detect is 138 copies/mL. A negative result does not preclude SARS-Cov-2 infection and should not be used as the sole basis for treatment or other patient management decisions. A negative result may occur with  improper specimen  collection/handling, submission of specimen other than nasopharyngeal swab, presence of viral mutation(s) within the areas targeted by this assay, and inadequate number of viral copies(<138 copies/mL). A negative result must be combined with clinical observations, patient history, and epidemiological information. The expected result is Negative.  Fact Sheet for Patients:  EntrepreneurPulse.com.au  Fact Sheet for Healthcare Providers:  IncredibleEmployment.be  This test is no t yet approved or cleared by the [place] FDA and  has been authorized for detection and/or diagnosis of SARS-CoV-2 by FDA under an Emergency Use Authorization (EUA). This EUA will remain  in effect (meaning this test can be used) for the duration of the COVID-19 declaration under Section 564(b)(1) of the Act, 21 U.S.C.section 360bbb-3(b)(1), unless the authorization is terminated  or revoked sooner.       Influenza A by PCR NEGATIVE NEGATIVE Final   Influenza B by PCR NEGATIVE NEGATIVE Final    Comment: (NOTE) The Xpert Xpress SARS-CoV-2/FLU/RSV plus assay is intended as an aid in the diagnosis of influenza from Nasopharyngeal swab specimens and should not be used as a sole basis for treatment. Nasal washings and aspirates are unacceptable for Xpert Xpress SARS-CoV-2/FLU/RSV testing.  Fact Sheet for Patients: EntrepreneurPulse.com.au  Fact Sheet for Healthcare Providers: IncredibleEmployment.be  This test is not yet approved or cleared by the [place] FDA and has been authorized for detection and/or diagnosis of SARS-CoV-2 by FDA under an Emergency Use Authorization (EUA). This EUA will remain in effect (meaning this test can be used) for the duration of the COVID-19 declaration under Section 564(b)(1) of the Act, 21 U.S.C. section 360bbb-3(b)(1), unless the authorization is terminated or revoked.  Performed at [facility], [place] [address] [place], [place] [address]   Culture, blood (routine x 2)     Status: None (Preliminary result)   Collection Time: 03/01/20  2:36 AM   Specimen: BLOOD  Result Value Ref Range Status   Specimen Description BLOOD LEFT ANTECUBITAL  Final   Special Requests   Final    BOTTLES DRAWN AEROBIC AND ANAEROBIC Blood Culture adequate volume   Culture   Final    NO GROWTH 1 DAY Performed at [facility], [place] [address], [place], [place] [address]    Report Status PENDING  Incomplete  Culture, blood (routine x 2)  Status: None (Preliminary result)   Collection Time: 03/01/20  2:44 AM   Specimen: BLOOD  Result Value Ref Range Status   Specimen Description BLOOD LEFT ARM  Final   Special Requests   Final    BOTTLES DRAWN AEROBIC ONLY Blood Culture adequate volume   Culture   Final    NO GROWTH 1 DAY Performed at [facility], [address] [place], [place] [address]    Report Status PENDING  Incomplete      Radiology Studies: DG CHEST PORT 1 VIEW  Result Date: 03/01/2020 CLINICAL DATA:  Shortness of breath. EXAM: PORTABLE CHEST 1 VIEW COMPARISON:  February 26, 2020 FINDINGS: Multiple sternal wires and vascular clips are seen. Appearing increased lung markings are seen. Mild to moderate severity atelectasis is seen within the left lung base. The silhouette is markedly enlarged. There is a large hiatal hernia. The visualized skeletal structures are unremarkable. IMPRESSION: 1. Cardiomegaly with mild to moderate severity left basilar atelectasis. 2. Large hiatal hernia. Electronically Signed   By: Virgina Norfolk M.D.   On: 03/01/2020 00:18   ECHO TEE  Result Date: 03/01/2020    TRANSESOPHOGEAL ECHO REPORT   Patient Name:   Brandon Rojas Date of Exam: 03/01/2020 Medical Rec #:  [id number]     Height:       64.0 in Accession #:    [id number]    Weight:       150.4 lb Date of Birth:  [birth year]      BSA:          1.733 m Patient Age:    1 years      BP:            153/79 mmHg Patient Gender: M             HR:           70 bpm. Exam Location:  Inpatient Procedure: Transesophageal Echo Indications:     I48.91* Unspeicified atrial fibrillation  History:         Patient has prior history of Echocardiogram examinations, most                  recent 02/26/2020. CAD, Stroke; Risk Factors:Hypertension and                  Dyslipidemia.  Sonographer:     Bernadene Person RDCS Referring Phys:  [id number] PHILIP J NAHSER Diagnosing Phys: Lyman Bishop MD PROCEDURE: After discussion of the risks and benefits of a TEE, an informed consent was obtained from the patient. The transesophogeal probe was passed without difficulty through the esophogus of the patient. Local oropharyngeal anesthetic was provided with Cetacaine. Sedation performed by different physician. The patient was monitored while under deep sedation. Anesthestetic sedation was provided intravenously by Anesthesiology: 132.19mg  of Propofol. The patient's vital signs; including heart rate, blood pressure, and oxygen saturation; remained stable throughout the procedure. The patient developed no complications during the procedure. A successful direct current cardioversion was performed at 150 joules with 1 attempt. IMPRESSIONS  1. Left ventricular ejection fraction, by estimation, is 60 to 65%. The left ventricle has normal function. There is mild left ventricular hypertrophy.  2. Right ventricular systolic function is normal. The right ventricular size is normal.  3. Left atrial size was moderately dilated. No left atrial/left atrial appendage thrombus was detected.  4. Right atrial size was severely dilated.  5. The mitral valve is abnormal. Moderate mitral valve regurgitation.  6.  The tricuspid valve is abnormal. Tricuspid valve regurgitation is moderate to severe.  7. The aortic valve is tricuspid. Aortic valve regurgitation is not visualized. Conclusion(s)/Recommendation(s): No LA/LAA thrombus identified. Successful  cardioversion performed with restoration of normal sinus rhythm. FINDINGS  Left Ventricle: Left ventricular ejection fraction, by estimation, is 60 to 65%. The left ventricle has normal function. The left ventricular internal cavity size was normal in size. There is mild left ventricular hypertrophy. Right Ventricle: The right ventricular size is normal. No increase in right ventricular wall thickness. Right ventricular systolic function is normal. Left Atrium: Left atrial size was moderately dilated. No left atrial/left atrial appendage thrombus was detected. Right Atrium: Right atrial size was severely dilated. Pericardium: There is no evidence of pericardial effusion. Mitral Valve: The mitral valve is abnormal. There is mild thickening of the mitral valve leaflet(s). Moderate mitral valve regurgitation, with centrally-directed jet. Tricuspid Valve: The tricuspid valve is abnormal. Tricuspid valve regurgitation is moderate to severe. Aortic Valve: The aortic valve is tricuspid. Aortic valve regurgitation is not visualized. Pulmonic Valve: The pulmonic valve was not well visualized. Pulmonic valve regurgitation is mild. Aorta: The aortic root and ascending aorta are structurally normal, with no evidence of dilitation. IAS/Shunts: No atrial level shunt detected by color flow Doppler.  MR Peak grad:    138.8 mmHg MR Mean grad:    87.0 mmHg MR Vmax:         589.00 cm/s MR Vmean:        439.0 cm/s MR PISA:         2.26 cm MR PISA Eff ROA: 15 mm MR PISA Radius:  0.60 cm Lyman Bishop MD Electronically signed by Lyman Bishop MD Signature Date/Time: 03/01/2020/3:09:38 PM    Final        Scheduled Meds: . apixaban  5 mg Oral BID  . aspirin EC  81 mg Oral Daily  . carbidopa-levodopa  2 tablet Oral TID  . hydrALAZINE  25 mg Oral Q8H  . nystatin  5 mL Oral QID  . pantoprazole  80 mg Oral Q1200  . pravastatin  20 mg Oral q1800  . sodium chloride flush  3 mL Intravenous Q12H  . torsemide  20 mg Oral BID  .  triamcinolone   Topical BID   Continuous Infusions: . sodium chloride       LOS: 5 days   Flora Lipps, MD [facility]   If 7PM-7AM, please contact night-coverage [email or web address]  03/02/2020, 11:58 AM

## 2020-03-02 NOTE — TOC Progression Note (Addendum)
Transition of Care [facility]) - Progression Note    Patient Details  Name: Brandon Rojas MRN: [id number] Date of Birth: 29-May-1945  Transition of Care [facility]) CM/SW Contact  Loletha Grayer Beverely Pace, RN Phone Number: 03/02/2020, 2:18 PM  Clinical Narrative:  Case Manager spoke with patient's wife Kermit Balo, concerning need for [place] at discharge. Choice for Home Health agencies was offered. Mrs. Aldridge requested that referral be called to [facility]. Ramond Marrow, Liaison for [facility] was contacted. Mrs. Kubisiak states her husband has RW and cane at home. TOC Team will continue to monitor.   2:51pm Ramond Marrow with [facility] called to say that they do not have staff to service patient. CM contacted Mrs. Aubuchon and she asked why, explained staffing issue. She was provided other [facility] agency options, asked that referral be called to [facility]. CM called Referral to Adela Lank, [facility] Liaison.    Expected Discharge Plan: [place] Barriers to Discharge: Continued Medical Work up  Expected Discharge Plan and Services Expected Discharge Plan: [place]   Discharge Planning Services: CM Consult Post Acute Care Choice: [place] arrangements for the past 2 months: Single Family Home                 DME Arranged: N/A         HH Arranged: PT   Date HH Agency Contacted: 03/02/20 Time HH Agency Contacted: [facility] Representative spoken with: [place]  Social Determinants of Health (SDOH) Interventions    Readmission Risk Interventions No flowsheet data found.

## 2020-03-02 NOTE — Telephone Encounter (Signed)
  I am not sure exactly what underlying lung disease was being referred to while he was hospitalized.  He has had extensive testing including imaging, pulmonary function testing.  He does not have obstructive lung disease.  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  This is almost certainly being impacted by his large hiatal hernia which prevents his lungs from feeling completely.  There is also component of deconditioning due to his decrease overall functional capacity.    He does not have obstructive lung disease.  He does not have an inhaler lung abnormality, scarring, injury etc.  He does have other issues including the hiatal hernia that are keeping his lungs from working to the best of their ability  Please make sure that he and his wife understand this summary.

## 2020-03-02 NOTE — Progress Notes (Signed)
Physical Therapy Treatment Patient Details Name: Brandon Rojas MRN: [id number] DOB: 02/10/1945 Today's Date: 03/02/2020    History of Present Illness Pt adm with new onset afib and acute diastolic heart failure. Pt also with acute metabolic encephalopathy. Pt with TEE with cardioversion on 2/22.  PMH - Parkinson's, dementia, CAD, CABG, TIA    PT Comments    Pt seen for second session to work on improving mobility in order to return home with wife. Pt with improving gait. Transition from sit to stand continues to be the most challenging.    Follow Up Recommendations  Home health PT;Supervision for mobility/OOB (if he is in hospital another day)     Equipment Recommendations  None recommended by PT    Recommendations for Other Services       Precautions / Restrictions Precautions Precautions: Fall    Mobility  Bed Mobility               General bed mobility comments: Pt up in chair    Transfers Overall transfer level: Needs assistance Equipment used: 4-wheeled walker Transfers: Sit to/from Stand Sit to Stand: Mod assist         General transfer comment: Assist to elevate hips and correct posterior lean. Verbal cues for hand placement  Ambulation/Gait Ambulation/Gait assistance: Min assist Gait Distance (Feet): 135 Feet Assistive device: 4-wheeled walker Gait Pattern/deviations: Step-through pattern;Decreased step length - right;Decreased step length - left;Shuffle;Trunk flexed Gait velocity: decr Gait velocity interpretation: <1.31 ft/sec, indicative of household ambulator General Gait Details: Assist for balance   Stairs             Wheelchair Mobility    Modified Rankin (Stroke Patients Only)       Balance Overall balance assessment: Needs assistance Sitting-balance support: Bilateral upper extremity supported;Feet supported Sitting balance-Leahy Scale: Poor Sitting balance - Comments: Intermittent min assist to correct posterior  lean Postural control: Posterior lean Standing balance support: Bilateral upper extremity supported Standing balance-Leahy Scale: Poor Standing balance comment: Rollator and min assist for static standing                            Cognition Arousal/Alertness: Awake/alert Behavior During Therapy: WFL for tasks assessed/performed Overall Cognitive Status: Within Functional Limits for tasks assessed                                        Exercises      General Comments General comments (skin integrity, edema, etc.): VSS on RA      Pertinent Vitals/Pain Pain Assessment: No/denies pain    Home Living                      Prior Function            PT Goals (current goals can now be found in the care plan section) Acute Rehab PT Goals Patient Stated Goal: get loosened up PT Goal Formulation: With patient/family Time For Goal Achievement: 03/10/20 Potential to Achieve Goals: Good Progress towards PT goals: Progressing toward goals    Frequency    Min 3X/week      PT Plan Current plan remains appropriate    Co-evaluation              AM-PAC PT "6 Clicks" Mobility   Outcome Measure  Help needed turning from your back  to your side while in a flat bed without using bedrails?: A Little Help needed moving from lying on your back to sitting on the side of a flat bed without using bedrails?: A Lot Help needed moving to and from a bed to a chair (including a wheelchair)?: A Little Help needed standing up from a chair using your arms (e.g., wheelchair or bedside chair)?: A Lot Help needed to walk in hospital room?: A Little Help needed climbing 3-5 steps with a railing? : A Lot 6 Click Score: 15    End of Session Equipment Utilized During Treatment: Gait belt Activity Tolerance: Patient tolerated treatment well Patient left: in chair;with call bell/phone within reach;with chair alarm set Nurse Communication: Mobility status PT  Visit Diagnosis: Other abnormalities of gait and mobility (R26.89);Unsteadiness on feet (R26.81);Muscle weakness (generalized) (M62.81)     Time: 2706-2376 PT Time Calculation (min) (ACUTE ONLY): 15 min  Charges:  $Gait Training: 8-22 mins                     Person Pager [phone] Office [place] 03/02/2020, 12:42 PM

## 2020-03-02 NOTE — Plan of Care (Signed)

## 2020-03-02 NOTE — Telephone Encounter (Signed)
Please advise on message from patients wife

## 2020-03-02 NOTE — Evaluation (Signed)
Physical Therapy Evaluation Patient Details Name: Brandon Rojas MRN: [id number] DOB: [birth year] Today's Date: 03/02/2020   History of Present Illness  Pt adm with new onset afib and acute diastolic heart failure. Pt also with acute metabolic encephalopathy. Pt with TEE with cardioversion on 2/22.  PMH - Parkinson's, dementia, CAD, CABG, TIA  Clinical Impression  Pt presents to PT with decr mobility after not being OOB x 5 days and with Parkinson's. Expect pt will progress rapidly with therapy and hopefully can reach a level where his wife is able to assist him in the next 24 hours.     Follow Up Recommendations Home health PT;Supervision for mobility/OOB (if he is in hospital another day)    Equipment Recommendations  None recommended by PT    Recommendations for Other Services       Precautions / Restrictions Precautions Precautions: Fall Restrictions Weight Bearing Restrictions: No      Mobility  Bed Mobility Overal bed mobility: Needs Assistance Bed Mobility: Sidelying to Sit   Sidelying to sit: Mod assist       General bed mobility comments: Assist to elevate trunk into sitting and to bring hips to EOB.    Transfers Overall transfer level: Needs assistance Equipment used: 4-wheeled walker Transfers: Sit to/from Stand Sit to Stand: Mod assist         General transfer comment: Assist to elevate hips and to correct posterior lean. Had pt place hands on rollator to encourage anterior weight shift.  Ambulation/Gait Ambulation/Gait assistance: Mod assist;Min assist Gait Distance (Feet): 125 Feet Assistive device: 4-wheeled walker Gait Pattern/deviations: Step-through pattern;Decreased step length - right;Decreased step length - left;Shuffle;Trunk flexed Gait velocity: decr Gait velocity interpretation: <1.31 ft/sec, indicative of household ambulator General Gait Details: Initially mod assist to correct posterior lean. Improved after pt got going and required only  min assist.  Stairs            Wheelchair Mobility    Modified Rankin (Stroke Patients Only)       Balance Overall balance assessment: Needs assistance Sitting-balance support: Bilateral upper extremity supported;Feet supported Sitting balance-Leahy Scale: Poor Sitting balance - Comments: Intermittent min assist to correct posterior lean Postural control: Posterior lean Standing balance support: Bilateral upper extremity supported Standing balance-Leahy Scale: Poor Standing balance comment: Rollator and min to mod assist for static standing                             Pertinent Vitals/Pain Pain Assessment: No/denies pain    Home Living Family/patient expects to be discharged to:: Private residence Living Arrangements: Spouse/significant other Available Help at Discharge: Family;Available 24 hours/day Type of Home: House Home Access: Stairs to enter Entrance Stairs-Rails: Right;Left;Can reach both Entrance Stairs-Number of Steps: 3 Home Layout: One level Home Equipment: Walker - 2 wheels;Cane - single point;Grab bars - tub/shower;Shower seat - built in;Walker - 4 wheels;Hospital bed      Prior Function Level of Independence: Needs assistance   Gait / Transfers Assistance Needed: Amb with cane or rolling walker. Supervision at times.           Hand Dominance   Dominant Hand: Right    Extremity/Trunk Assessment   Upper Extremity Assessment Upper Extremity Assessment: Defer to OT evaluation    Lower Extremity Assessment Lower Extremity Assessment: Generalized weakness (Lt > Rt)       Communication   Communication: No difficulties  Cognition Arousal/Alertness: Awake/alert Behavior During Therapy: WFL for  tasks assessed/performed Overall Cognitive Status: Within Functional Limits for tasks assessed                                        General Comments General comments (skin integrity, edema, etc.): VSS on RA     Exercises     Assessment/Plan    PT Assessment Patient needs continued PT services  PT Problem List Decreased strength;Decreased activity tolerance;Decreased balance;Decreased mobility       PT Treatment Interventions DME instruction;Gait training;Stair training;Functional mobility training;Therapeutic activities;Therapeutic exercise;Balance training;Patient/family education    PT Goals (Current goals can be found in the Care Plan section)  Acute Rehab PT Goals Patient Stated Goal: get loosened up PT Goal Formulation: With patient/family Time For Goal Achievement: 03/10/20 Potential to Achieve Goals: Good    Frequency Min 3X/week   Barriers to discharge Inaccessible home environment stairs to enter    Co-evaluation               AM-PAC PT "6 Clicks" Mobility  Outcome Measure Help needed turning from your back to your side while in a flat bed without using bedrails?: A Little Help needed moving from lying on your back to sitting on the side of a flat bed without using bedrails?: A Lot Help needed moving to and from a bed to a chair (including a wheelchair)?: A Lot Help needed standing up from a chair using your arms (e.g., wheelchair or bedside chair)?: A Lot Help needed to walk in hospital room?: A Lot Help needed climbing 3-5 steps with a railing? : A Lot 6 Click Score: 13    End of Session Equipment Utilized During Treatment: Gait belt Activity Tolerance: Patient limited by fatigue Patient left: in chair;with call bell/phone within reach;with chair alarm set Nurse Communication: Mobility status PT Visit Diagnosis: Other abnormalities of gait and mobility (R26.89);Unsteadiness on feet (R26.81);Muscle weakness (generalized) (M62.81)    Time: 5176-1607 PT Time Calculation (min) (ACUTE ONLY): 32 min   Charges:   PT Evaluation $PT Eval Moderate Complexity: 1 Mod PT Treatments $Gait Training: 8-22 mins        [place] Pager [phone] Office [place] 03/02/2020, 10:23 AM

## 2020-03-03 ENCOUNTER — Other Ambulatory Visit: Payer: Self-pay | Admitting: Internal Medicine

## 2020-03-03 DIAGNOSIS — I5031 Acute diastolic (congestive) heart failure: Secondary | ICD-10-CM | POA: Diagnosis not present

## 2020-03-03 DIAGNOSIS — I25810 Atherosclerosis of coronary artery bypass graft(s) without angina pectoris: Secondary | ICD-10-CM | POA: Diagnosis not present

## 2020-03-03 DIAGNOSIS — R0602 Shortness of breath: Secondary | ICD-10-CM | POA: Diagnosis not present

## 2020-03-03 DIAGNOSIS — I4891 Unspecified atrial fibrillation: Secondary | ICD-10-CM | POA: Diagnosis not present

## 2020-03-03 MED ORDER — TORSEMIDE 20 MG PO TABS: 20.0000 mg | ORAL_TABLET | Freq: Two times a day (BID) | ORAL | 2 refills | Status: DC

## 2020-03-03 MED ORDER — APIXABAN 5 MG PO TABS: 5.0000 mg | ORAL_TABLET | Freq: Two times a day (BID) | ORAL | 2 refills | Status: DC

## 2020-03-03 MED ORDER — POLYETHYLENE GLYCOL 3350 17 G PO PACK: 17.0000 g | PACK | Freq: Every day | ORAL | 0 refills | Status: DC | PRN

## 2020-03-03 MED ORDER — HYDRALAZINE HCL 25 MG PO TABS: 25.0000 mg | ORAL_TABLET | Freq: Three times a day (TID) | ORAL | 2 refills | Status: DC

## 2020-03-03 MED ORDER — NYSTATIN 100000 UNIT/ML MT SUSP: 5.0000 mL | Freq: Four times a day (QID) | OROMUCOSAL | 0 refills | Status: DC

## 2020-03-03 MED FILL — TORSEMIDE 20 MG TABLET: 20 | 30 days supply | Qty: 60 | Fill #0

## 2020-03-03 MED FILL — ELIQUIS 5 MG TABLET: 5 | 30 days supply | Qty: 60 | Fill #0

## 2020-03-03 MED FILL — NYSTATIN 100000 UNIT/ML SUS: 100000 | 6 days supply | Qty: 120 | Fill #0

## 2020-03-03 MED FILL — hydrALAZINE HCL 25 MG TABS: 25 | 30 days supply | Qty: 90 | Fill #0

## 2020-03-03 MED FILL — POLYETHYLENE GLYCOL 3350 PO: 17 | 14 days supply | Qty: 238 | Fill #0

## 2020-03-03 NOTE — Telephone Encounter (Signed)
Please see message from patients wife. Patient is currently admitted in [facility] hospital  My husband is now in [facility], the issue he was having was AFIB, this was with his heart so the breathing issue is not in question now. Do not set up future appointments for him. Please cancel the physical therapy that he talked about for my husband at [facility]. He will be doing it at home with in home therapy. He is not able to go to the hospital to do this!  Thanks!!

## 2020-03-03 NOTE — Evaluation (Signed)
Occupational Therapy Evaluation Patient Details Name: Brandon Rojas MRN: [id number] DOB: [birth year] Today's Date: 03/03/2020    History of Present Illness Pt adm with new onset afib and acute diastolic heart failure. Pt also with acute metabolic encephalopathy. Pt with TEE with cardioversion on 2/22.  PMH - Parkinson's, dementia, CAD, CABG, TIA   Clinical Impression   Pt was ambulatory with a RW and intermittent supervision. His wife assisted with showering, LB dressing and all IADL Pt reports she is in good health. Pt received Parkinson's medication during session. He demonstrated ability to stand  from elevated bed and ambulate with min guard assist and cues for hand placement with RW. Pt requires set up to max assist for ADL. MD visited pt during evaluation and plans to discharge pt today. Will defer OT to [facility].     Follow Up Recommendations  Home health OT    Equipment Recommendations  None recommended by OT    Recommendations for Other Services       Precautions / Restrictions Precautions Precautions: Fall      Mobility Bed Mobility Overal bed mobility: Needs Assistance Bed Mobility: Supine to Sit     Supine to sit: Min assist     General bed mobility comments: pulled up on therapist's hand to raise trunk    Transfers Overall transfer level: Needs assistance Equipment used: Rolling walker (2 wheeled) Transfers: Sit to/from Stand Sit to Stand: Min guard;From elevated surface         General transfer comment: cues for hand placement, bed elevated    Balance Overall balance assessment: Needs assistance Sitting-balance support: No upper extremity supported Sitting balance-Leahy Scale: Fair Sitting balance - Comments: took meds without LOB at EOB   Standing balance support: Bilateral upper extremity supported Standing balance-Leahy Scale: Poor Standing balance comment: reliant on RW and min guard assist                           ADL either performed  or assessed with clinical judgement   ADL Overall ADL's : Needs assistance/impaired Eating/Feeding: Set up;Sitting   Grooming: Wash/dry hands;Wash/dry face;Standing;Oral care Grooming Details (indicate cue type and reason): ambulated to sink to put teeth in and wash face and hands with min guard assist Upper Body Bathing: Minimal assistance;Sitting   Lower Body Bathing: Maximal assistance;Sit to/from stand   Upper Body Dressing : Set up;Sitting   Lower Body Dressing: Maximal assistance;Sit to/from stand   Toilet Transfer: Min guard;Ambulation;RW   Toileting- Clothing Manipulation and Hygiene: Minimal assistance;Sit to/from stand       Functional mobility during ADLs: Min guard;Rolling walker       Vision Patient Visual Report: No change from baseline       Perception     Praxis      Pertinent Vitals/Pain Pain Assessment: No/denies pain     Hand Dominance Right   Extremity/Trunk Assessment Upper Extremity Assessment Upper Extremity Assessment: Overall WFL for tasks assessed   Lower Extremity Assessment Lower Extremity Assessment: Defer to PT evaluation       Communication Communication Communication: No difficulties   Cognition Arousal/Alertness: Awake/alert Behavior During Therapy: WFL for tasks assessed/performed Overall Cognitive Status: Within Functional Limits for tasks assessed                                 General Comments: pt with baseline dementia per chart  General Comments       Exercises     Shoulder Instructions      Home Living Family/patient expects to be discharged to:: Private residence Living Arrangements: Spouse/significant other Available Help at Discharge: Family;Available 24 hours/day Type of Home: House Home Access: Stairs to enter [employer] of Steps: 3 Entrance Stairs-Rails: Right;Left;Can reach both Home Layout: One level     Bathroom Shower/Tub: Occupational psychologist:  Handicapped height     Home Equipment: Environmental consultant - 2 wheels;Cane - single point;Grab bars - tub/shower;Shower seat - built in;Walker - 4 wheels;Hospital bed          Prior Functioning/Environment Level of Independence: Needs assistance  Gait / Transfers Assistance Needed: Amb with cane or rolling walker. Supervision at times. ADL's / Homemaking Assistance Needed: assisted for showering, LB dressing and all IADL by wife            OT Problem List: Impaired balance (sitting and/or standing)      OT Treatment/Interventions:      OT Goals(Current goals can be found in the care plan section) Acute Rehab OT Goals Patient Stated Goal: go home today  OT Frequency:     Barriers to D/C:            Co-evaluation              AM-PAC OT "6 Clicks" Daily Activity     Outcome Measure Help from another person eating meals?: None Help from another person taking care of personal grooming?: A Little Help from another person toileting, which includes using toliet, bedpan, or urinal?: A Little Help from another person bathing (including washing, rinsing, drying)?: A Lot Help from another person to put on and taking off regular upper body clothing?: A Little Help from another person to put on and taking off regular lower body clothing?: A Lot 6 Click Score: 17   End of Session Equipment Utilized During Treatment: Rolling walker;Gait belt Nurse Communication: Mobility status  Activity Tolerance: Patient tolerated treatment well Patient left: in chair;with call bell/phone within reach;with chair alarm set  OT Visit Diagnosis: Other abnormalities of gait and mobility (R26.89);Unsteadiness on feet (R26.81)                Time: 8341-9622 OT Time Calculation (min): 29 min Charges:  OT General Charges $OT Visit: 1 Visit OT Evaluation $OT Eval Moderate Complexity: 1 Mod OT Treatments $Self Care/Home Management : 8-22 mins  Nestor Lewandowsky, OTR/L Acute Rehabilitation Services Pager:  [phone] Office: [phone]  [place] So 03/03/2020, 9:21 AM

## 2020-03-03 NOTE — Progress Notes (Signed)
Progress Note  Patient Name: Brandon Rojas Date of Encounter: 03/03/2020  [facility] Cardiologist: Minus Breeding, MD   Subjective   75 year old gentleman with a history of coronary artery disease, CABG, mitral regurgitation, tricuspid regurgitation, hypertension, hyperlipidemia.  He also has Parkinson's disease.  He was seen for further evaluation of congestive heart failure and atrial fibrillation.  Has diuresed 4 liters so far this admission  Is getting PT to help with mobility    Going home today      Inpatient Medications    Scheduled Meds: . apixaban  5 mg Oral BID  . aspirin EC  81 mg Oral Daily  . carbidopa-levodopa  2 tablet Oral TID  . hydrALAZINE  25 mg Oral Q8H  . nystatin  5 mL Oral QID  . pantoprazole  80 mg Oral Q1200  . pravastatin  20 mg Oral q1800  . sodium chloride flush  3 mL Intravenous Q12H  . torsemide  20 mg Oral BID  . triamcinolone   Topical BID   Continuous Infusions: . sodium chloride     PRN Meds: sodium chloride, acetaminophen **OR** acetaminophen, albuterol, nitroGLYCERIN, polyethylene glycol, sodium chloride flush   Vital Signs    Vitals:   03/03/20 0100 03/03/20 0425 03/03/20 0533 03/03/20 0848  BP: (!) 148/73 129/67 (!) 160/77 136/69  Pulse:  (!) 58  63  Resp:  12  18  Temp:  98.4 F (36.9 C)    TempSrc:  Oral    SpO2:  95%  96%  Weight:  69.1 kg    Height:        Intake/Output Summary (Last 24 hours) at 03/03/2020 1012 Last data filed at 03/03/2020 0534 Gross per 24 hour  Intake 3 ml  Output 900 ml  Net -897 ml   Last 3 Weights 03/03/2020 03/02/2020 03/01/2020  Weight (lbs) 152 lb 5.4 oz 152 lb 1.6 oz 150 lb 5.7 oz  Weight (kg) 69.1 kg 68.992 kg 68.2 kg      Telemetry    NSR - Personally Reviewed  ECG     - Personally Reviewed  Physical Exam    Physical Exam: Blood pressure 136/69, pulse 63, temperature 98.4 F (36.9 C), temperature source Oral, resp. rate 18, height 5\' 4"  (1.626 m), weight 69.1 kg,  SpO2 96 %.  GEN:   Chronically ill appearing male  HEENT: Normal NECK: No JVD; No carotid bruits LYMPHATICS: No lymphadenopathy CARDIAC: RRR  RESPIRATORY:  Clear to auscultation without rales, wheezing or rhonchi  ABDOMEN: Soft, non-tender, non-distended MUSCULOSKELETAL:  No edema; No deformity  SKIN: Warm and dry NEUROLOGIC:   Weak, has parkinsons    Labs    High Sensitivity Troponin:   Recent Labs  Lab 02/26/20 1003 02/26/20 1249  TROPONINIHS 44* 44*      Chemistry Recent Labs  Lab 02/26/20 1003 02/27/20 0356 02/29/20 0207 03/01/20 0015 03/02/20 0210  NA 138   < > 140 140 135  K 4.0   < > 4.0 3.4* 4.0  CL 106   < > 103 101 99  CO2 20*   < > 22 27 24   GLUCOSE 121*   < > 108* 109* 98  BUN 17   < > 22 20 26*  CREATININE 1.05   < > 1.23 1.28* 1.42*  CALCIUM 9.2   < > 9.3 9.2 8.8*  PROT 6.4*  --   --  5.8*  --   ALBUMIN 3.1*  --   --  2.5*  --  AST 25  --   --  20  --   ALT 5  --   --  <5  --   ALKPHOS 106  --   --  100  --   BILITOT 0.9  --   --  1.1  --   GFRNONAA >60   < > >60 58* 52*  ANIONGAP 12   < > 15 12 12    < > = values in this interval not displayed.     Hematology Recent Labs  Lab 02/27/20 0356 02/29/20 0207 03/01/20 0015  WBC 6.6 10.0 16.0*  RBC 4.23 4.26 4.18*  HGB 12.3* 12.2* 12.1*  HCT 36.4* 36.4* 35.3*  MCV 86.1 85.4 84.4  MCH 29.1 28.6 28.9  MCHC 33.8 33.5 34.3  RDW 14.3 14.2 14.4  PLT 134* 165 160    BNP Recent Labs  Lab 02/26/20 1014  BNP 598.7*     DDimer No results for input(s): DDIMER in the last 168 hours.   Radiology    ECHO TEE  Result Date: 03/01/2020    TRANSESOPHOGEAL ECHO REPORT   Patient Name:   ZAKKARY THIBAULT Date of Exam: 03/01/2020 Medical Rec #:  [id number]     Height:       64.0 in Accession #:    [id number]    Weight:       150.4 lb Date of Birth:  02/12/1945      BSA:          1.733 m Patient Age:    33 years      BP:           153/79 mmHg Patient Gender: M             HR:           70 bpm. Exam Location:   Inpatient Procedure: Transesophageal Echo Indications:     I48.91* Unspeicified atrial fibrillation  History:         Patient has prior history of Echocardiogram examinations, most                  recent 02/26/2020. CAD, Stroke; Risk Factors:Hypertension and                  Dyslipidemia.  Sonographer:     Bernadene Person RDCS Referring Phys:  [id number] PHILIP J NAHSER Diagnosing Phys: Lyman Bishop MD PROCEDURE: After discussion of the risks and benefits of a TEE, an informed consent was obtained from the patient. The transesophogeal probe was passed without difficulty through the esophogus of the patient. Local oropharyngeal anesthetic was provided with Cetacaine. Sedation performed by different physician. The patient was monitored while under deep sedation. Anesthestetic sedation was provided intravenously by Anesthesiology: 132.19mg  of Propofol. The patient's vital signs; including heart rate, blood pressure, and oxygen saturation; remained stable throughout the procedure. The patient developed no complications during the procedure. A successful direct current cardioversion was performed at 150 joules with 1 attempt. IMPRESSIONS  1. Left ventricular ejection fraction, by estimation, is 60 to 65%. The left ventricle has normal function. There is mild left ventricular hypertrophy.  2. Right ventricular systolic function is normal. The right ventricular size is normal.  3. Left atrial size was moderately dilated. No left atrial/left atrial appendage thrombus was detected.  4. Right atrial size was severely dilated.  5. The mitral valve is abnormal. Moderate mitral valve regurgitation.  6. The tricuspid valve is abnormal. Tricuspid valve regurgitation is moderate to severe.  7.  The aortic valve is tricuspid. Aortic valve regurgitation is not visualized. Conclusion(s)/Recommendation(s): No LA/LAA thrombus identified. Successful cardioversion performed with restoration of normal sinus rhythm. FINDINGS  Left Ventricle: Left  ventricular ejection fraction, by estimation, is 60 to 65%. The left ventricle has normal function. The left ventricular internal cavity size was normal in size. There is mild left ventricular hypertrophy. Right Ventricle: The right ventricular size is normal. No increase in right ventricular wall thickness. Right ventricular systolic function is normal. Left Atrium: Left atrial size was moderately dilated. No left atrial/left atrial appendage thrombus was detected. Right Atrium: Right atrial size was severely dilated. Pericardium: There is no evidence of pericardial effusion. Mitral Valve: The mitral valve is abnormal. There is mild thickening of the mitral valve leaflet(s). Moderate mitral valve regurgitation, with centrally-directed jet. Tricuspid Valve: The tricuspid valve is abnormal. Tricuspid valve regurgitation is moderate to severe. Aortic Valve: The aortic valve is tricuspid. Aortic valve regurgitation is not visualized. Pulmonic Valve: The pulmonic valve was not well visualized. Pulmonic valve regurgitation is mild. Aorta: The aortic root and ascending aorta are structurally normal, with no evidence of dilitation. IAS/Shunts: No atrial level shunt detected by color flow Doppler.  MR Peak grad:    138.8 mmHg MR Mean grad:    87.0 mmHg MR Vmax:         589.00 cm/s MR Vmean:        439.0 cm/s MR PISA:         2.26 cm MR PISA Eff ROA: 15 mm MR PISA Radius:  0.60 cm Lyman Bishop MD Electronically signed by Lyman Bishop MD Signature Date/Time: 03/01/2020/3:09:38 PM    Final     Cardiac Studies     Patient Profile     75 y.o. male with parkinsons Atrial fib with controlled V response   Assessment & Plan    1. Atrial fib with controlled. V response:  Mr. Crean presents since with symptoms of congestive heart failure.  His ejection fraction is 60 to 65%. Still in sinus rhythm Cont eliquis 5 mg bid   2.  Acute congestive heart failure: The patient is found severe tricuspid regurgitation with  evidence of RV pressure and volume overload. Continue to diurese   3.  Hx of TIA :   He was on plavix and ASA for a tia We stopped the plavix when we started the eliquis . He may not actually need to continue the ASA . Nile Riggs, PharmD is looking into neuro's recommendations   He can go home today     For questions or updates, please contact [place] Please consult [email or web address] for contact info under      Signed, Mertie Moores, MD  03/03/2020, 10:12 AM

## 2020-03-03 NOTE — Discharge Summary (Signed)
Physician Discharge Summary  Brandon Rojas YYT:[id number] DOB: 09-May-1945 DOA: 02/26/2020  PCP: Biagio Borg, MD  Admit date: 02/26/2020 Discharge date: 03/03/2020  Admitted From: Home  Discharge disposition: Home  Recommendations for Outpatient Follow-Up:   . Follow up with your primary care provider in one week.  . Check CBC, BMP, magnesium in the next visit . Follow-up with cardiology as scheduled by the clinic  Discharge Diagnosis:   Principal Problem:   Atrial fibrillation, new onset ([place]) Active Problems:   Essential hypertension   History of TIA (transient ischemic attack)   Peripheral edema   Parkinson's disease (HCC)   Elevated troponin   CAD (coronary artery disease) of artery bypass graft   Type 2 diabetes mellitus with complication, without long-term current use of insulin (HCC)   Dyspnea   Acute diastolic CHF (congestive heart failure) ([place])   Discharge Condition: Improved.  Diet recommendation: Low sodium, heart healthy.  Carbohydrate-modified.   Wound care: None.  Code status: Full.   History of Present Illness:   Patient is a 75 years old male with past medical history of Parkinson's disease, coronary artery disease status post CABG, history of TIA presented to hospital with dyspnea and shortness of breath with generalized debility and functional decline for several months with 2 weeks history of worsening shortness of breath and weight gain.  Patient was also confused on presentation with some hallucinations.  Patient was then admitted to hospital for new onset atrial fibrillation, acute diastolic heart failure exacerbation with severe tricuspid valve regurgitation.  Cardiology was consulted.   Hospital Course:   Following conditions were addressed during hospitalization as listed below,  New onset paroxysmal atrial fibrillation. Cardiology was consulted and patient underwent  TEE with DCCV on 2/22 with conversion to NSR.  Will be continued on  aspirin and apixaban on discharge.  Acute diastolic heart failure, severe tricuspid Valve regurgitation;  Initially, received IV Lasix twice a day.  Has been changed to torsemide.  Will be continued on discharge as per cardiology recommendation.  Patient was initially on oxygen which has been discontinued at this time.  Acute metabolic encephalopathy, cognitive difficulties, visual hallucination: MRI was negative for acute stroke.  Seen by neurology.  Currently likely at baseline.  Elevation of troponin: Likely demand ischemia.  No chest pain or acute ischemic changes in the EKG.  will discharge the patient.  Parkinson disease associated with dementia.  continue with sinemet.   History of TIA: Continue aspirin, Eliquis and statins.  Plavix will be discontinued.  Oral thrush; continue nystatin for next few days  Mild leukocytosis.    No signs of infection at this time. could be reactive.  Deconditioning, debility, ambulatory dysfunction.  PT recommend continued physical therapy at home, home health PT will be arranged on discharge.[name]  He does not wish for skilled nursing facility.  Disposition.  At this time, patient is stable for disposition to skilled nursing facility.  Spoke with the patient's spouse yesterday regarding disposition today.  Medical Consultants:    Cardiology  Neurology   Procedures:    TEE with DC cardioversion on 03/01/2020 Subjective:   Today, patient was seen and examined at bedside.  Patient denies any dizziness lightheadedness shortness of breath chest pain palpitation.  Discharge Exam:   Vitals:   03/03/20 0533 03/03/20 0848  BP: (!) 160/77 136/69  Pulse:  63  Resp:  18  Temp:    SpO2:  96%   Vitals:   03/03/20 0100 03/03/20 0425  03/03/20 0533 03/03/20 0848  BP: (!) 148/73 129/67 (!) 160/77 136/69  Pulse:  (!) 58  63  Resp:  12  18  Temp:  98.4 F (36.9 C)    TempSrc:  Oral    SpO2:  95%  96%  Weight:  69.1 kg    Height:         General: Alert awake, not in obvious distress HENT: pupils equally reacting to light,  No scleral pallor or icterus noted. Oral mucosa is moist.  Chest:    Diminished breath sounds bilaterally.  CVS: S1 &S2 heard. No murmur.  Regular rate and rhythm.  Status post CABG scar Abdomen: Soft, nontender, nondistended.  Bowel sounds are heard.   Extremities: No cyanosis, clubbing or edema.  Peripheral pulses are palpable. Psych: Alert, awake and oriented, normal mood CNS:  No cranial nerve deficits.  Power equal in all extremities.   Skin: Warm and dry.  No rashes noted.  The results of significant diagnostics from this hospitalization (including imaging, microbiology, ancillary and laboratory) are listed below for reference.     Diagnostic Studies:   DG Chest 2 View  Result Date: 02/26/2020 CLINICAL DATA:  Dyspnea and cough for 3 weeks EXAM: CHEST - 2 VIEW COMPARISON:  01/15/2020 chest radiograph. FINDINGS: Intact sternotomy wires. Stable cardiomediastinal silhouette with moderate cardiomegaly and moderate to large hiatal hernia. No pneumothorax. No pleural effusion. Cephalization of the pulmonary vasculature without overt pulmonary edema. Low lung volumes with medial left lung base atelectasis. IMPRESSION: 1. Low lung volumes with medial left lung base atelectasis. 2. Moderate cardiomegaly without overt pulmonary edema. 3. Moderate to large hiatal hernia. Electronically Signed   By: Ilona Sorrel M.D.   On: 02/26/2020 10:13   MR BRAIN WO CONTRAST  Result Date: 02/26/2020 CLINICAL DATA:  Transient ischemic attack. Shortness of breath cough. EXAM: MRI HEAD WITHOUT CONTRAST TECHNIQUE: Multiplanar, multiecho pulse sequences of the brain and surrounding structures were obtained without intravenous contrast. COMPARISON:  CT exam 05/14/2019. FINDINGS: Brain: The study suffers from motion degradation and is abbreviated due to lack of patient tolerance. Diffusion imaging is of good quality and does not  show any acute or subacute infarction. No focal abnormality affects the brainstem or cerebellum. Old small vessel infarctions in thalami. Moderate chronic small-vessel ischemic changes of the cerebral hemispheric white matter. No mass lesion, hemorrhage, hydrocephalus or extra-axial collection. Vascular: Major vessels at the base of the brain show flow. Skull and upper cervical spine: Negative Sinuses/Orbits: Clear/normal Other: None IMPRESSION: Abbreviated exam because of motion degradation and lack of patient tolerance. No acute or reversible finding. Moderate chronic small-vessel ischemic changes affecting the brain as outlined above. Electronically Signed   By: Nelson Chimes M.D.   On: 02/26/2020 21:26   ECHOCARDIOGRAM COMPLETE  Result Date: 02/26/2020    ECHOCARDIOGRAM REPORT   Patient Name:   SHAHMEER BUNN Date of Exam: 02/26/2020 Medical Rec #:  [id number]     Height:       58.0 in Accession #:    [id number]    Weight:       160.0 lb Date of Birth:  16-Jan-1945      BSA:          1.656 m Patient Age:    1 years      BP:           163/82 mmHg Patient Gender: M             HR:  74 bpm. Exam Location:  Inpatient Procedure: 2D Echo, Cardiac Doppler and Color Doppler Indications:    Dyspnea R06.00  History:        Patient has prior history of Echocardiogram examinations, most                 recent 08/27/2018. CAD, Stroke; Risk Factors:Hypertension and                 Dyslipidemia. Parkinson's Disease. GERD.  Sonographer:    Tiffany Dance Referring Phys: [id number] [place]  1. Left ventricular ejection fraction, by estimation, is 60 to 65%. The left ventricle has normal function. The left ventricle has no regional wall motion abnormalities. There is mild left ventricular hypertrophy. Left ventricular diastolic parameters are indeterminate. There is the interventricular septum is flattened in systole and diastole, consistent with right ventricular pressure and volume overload.  2.  Right ventricular systolic function is moderately reduced. The right ventricular size is moderately enlarged. There is normal pulmonary artery systolic pressure. The estimated right ventricular systolic pressure is 73.4 mmHg.  3. Left atrial size was moderately dilated.  4. Right atrial size was severely dilated.  5. The mitral valve is normal in structure. Mild mitral valve regurgitation. No evidence of mitral stenosis.  6. Tricuspid valve regurgitation is severe.  7. The aortic valve is grossly normal. Aortic valve regurgitation is not visualized. Mild aortic valve sclerosis is present, with no evidence of aortic valve stenosis.  8. Pulmonic valve regurgitation is moderate.  9. The inferior vena cava is dilated in size with <50% respiratory variability, suggesting right atrial pressure of 15 mmHg. FINDINGS  Left Ventricle: Left ventricular ejection fraction, by estimation, is 60 to 65%. The left ventricle has normal function. The left ventricle has no regional wall motion abnormalities. The left ventricular internal cavity size was normal in size. There is  mild left ventricular hypertrophy. The interventricular septum is flattened in systole and diastole, consistent with right ventricular pressure and volume overload. Left ventricular diastolic parameters are indeterminate. Right Ventricle: The right ventricular size is moderately enlarged. No increase in right ventricular wall thickness. Right ventricular systolic function is moderately reduced. There is normal pulmonary artery systolic pressure. The tricuspid regurgitant velocity is 2.08 m/s, and with an assumed right atrial pressure of 15 mmHg, the estimated right ventricular systolic pressure is 19.3 mmHg. Left Atrium: Left atrial size was moderately dilated. Right Atrium: Right atrial size was severely dilated. Pericardium: There is no evidence of pericardial effusion. Mitral Valve: The mitral valve is normal in structure. Mild mitral valve regurgitation. No  evidence of mitral valve stenosis. Tricuspid Valve: The tricuspid valve is normal in structure. Tricuspid valve regurgitation is severe. No evidence of tricuspid stenosis. Aortic Valve: The aortic valve is grossly normal. Aortic valve regurgitation is not visualized. Mild aortic valve sclerosis is present, with no evidence of aortic valve stenosis. Pulmonic Valve: The pulmonic valve was normal in structure. Pulmonic valve regurgitation is moderate. No evidence of pulmonic stenosis. Aorta: The aortic root is normal in size and structure. Venous: The inferior vena cava is dilated in size with less than 50% respiratory variability, suggesting right atrial pressure of 15 mmHg. IAS/Shunts: No atrial level shunt detected by color flow Doppler.  LEFT VENTRICLE PLAX 2D LVIDd:         4.70 cm LVIDs:         3.60 cm LV PW:         1.40 cm LV IVS:  1.20 cm LVOT diam:     1.90 cm LV SV:         36 LV SV Index:   22 LVOT Area:     2.84 cm  RIGHT VENTRICLE          IVC RV Basal diam:  4.60 cm  IVC diam: 3.20 cm RV Mid diam:    4.10 cm TAPSE (M-mode): 1.8 cm LEFT ATRIUM             Index       RIGHT ATRIUM           Index LA diam:        5.40 cm 3.26 cm/m  RA Area:     44.70 cm LA Vol (A2C):   83.1 ml 50.17 ml/m RA Volume:   192.00 ml 115.92 ml/m LA Vol (A4C):   68.4 ml 41.30 ml/m LA Biplane Vol: 80.0 ml 48.30 ml/m  AORTIC VALVE LVOT Vmax:   70.30 cm/s LVOT Vmean:  45.600 cm/s LVOT VTI:    0.126 m  AORTA Ao Root diam: 2.90 cm Ao Asc diam:  3.50 cm MITRAL VALVE                TRICUSPID VALVE MV Area (PHT): 3.45 cm     TR Peak grad:   17.3 mmHg MV Decel Time: 220 msec     TR Vmax:        208.00 cm/s MV E velocity: 104.05 cm/s                             SHUNTS                             Systemic VTI:  0.13 m                             Systemic Diam: 1.90 cm Candee Furbish MD Electronically signed by Candee Furbish MD Signature Date/Time: 02/26/2020/4:53:00 PM    Final      Labs:   Basic Metabolic Panel: Recent Labs   Lab 02/27/20 0356 02/28/20 1324 02/29/20 0207 03/01/20 0015 03/02/20 0210  NA 139 138 140 140 135  K 3.8 3.6 4.0 3.4* 4.0  CL 107 106 103 101 99  CO2 23 24 22 27 24   GLUCOSE 97 163* 108* 109* 98  BUN 15 18 22 20  26*  CREATININE 1.08 1.13 1.23 1.28* 1.42*  CALCIUM 9.2 9.0 9.3 9.2 8.8*  MG  --   --   --  1.4* 1.9  PHOS  --   --   --  3.8  --    GFR Estimated Creatinine Clearance: 37.6 mL/min (A) (by C-G formula based on SCr of 1.42 mg/dL (H)). Liver Function Tests: Recent Labs  Lab 02/26/20 1003 03/01/20 0015  AST 25 20  ALT 5 <5  ALKPHOS 106 100  BILITOT 0.9 1.1  PROT 6.4* 5.8*  ALBUMIN 3.1* 2.5*   No results for input(s): LIPASE, AMYLASE in the last 168 hours. No results for input(s): AMMONIA in the last 168 hours. Coagulation profile Recent Labs  Lab 02/27/20 0404 03/01/20 0015  INR 1.1 2.2*    CBC: Recent Labs  Lab 02/26/20 1003 02/27/20 0356 02/29/20 0207 03/01/20 0015  WBC 8.1 6.6 10.0 16.0*  HGB 12.3* 12.3* 12.2* 12.1*  HCT 37.4* 36.4* 36.4* 35.3*  MCV 87.8 86.1 85.4 84.4  PLT 118* 134* 165 160   Cardiac Enzymes: No results for input(s): CKTOTAL, CKMB, CKMBINDEX, TROPONINI in the last 168 hours. BNP: Invalid input(s): POCBNP CBG: Recent Labs  Lab 02/29/20 0802 02/29/20 1126 02/29/20 1557 02/29/20 2102 03/01/20 0734  GLUCAP 100* 112* 109* 100* 101*   D-Dimer No results for input(s): DDIMER in the last 72 hours. Hgb A1c No results for input(s): HGBA1C in the last 72 hours. Lipid Profile No results for input(s): CHOL, HDL, LDLCALC, TRIG, CHOLHDL, LDLDIRECT in the last 72 hours. Thyroid function studies No results for input(s): TSH, T4TOTAL, T3FREE, THYROIDAB in the last 72 hours.  Invalid input(s): FREET3 Anemia work up No results for input(s): VITAMINB12, FOLATE, FERRITIN, TIBC, IRON, RETICCTPCT in the last 72 hours. Microbiology Recent Results (from the past 240 hour(s))  Resp Panel by RT-PCR (Flu A&B, Covid) Nasopharyngeal Swab      Status: None   Collection Time: 02/26/20 10:55 AM   Specimen: Nasopharyngeal Swab; Nasopharyngeal(NP) swabs in vial transport medium  Result Value Ref Range Status   SARS Coronavirus 2 by RT PCR NEGATIVE NEGATIVE Final    Comment: (NOTE) SARS-CoV-2 target nucleic acids are NOT DETECTED.  The SARS-CoV-2 RNA is generally detectable in upper respiratory specimens during the acute phase of infection. The lowest concentration of SARS-CoV-2 viral copies this assay can detect is 138 copies/mL. A negative result does not preclude SARS-Cov-2 infection and should not be used as the sole basis for treatment or other patient management decisions. A negative result may occur with  improper specimen collection/handling, submission of specimen other than nasopharyngeal swab, presence of viral mutation(s) within the areas targeted by this assay, and inadequate number of viral copies(<138 copies/mL). A negative result must be combined with clinical observations, patient history, and epidemiological information. The expected result is Negative.  Fact Sheet for Patients:  EntrepreneurPulse.com.au  Fact Sheet for Healthcare Providers:  IncredibleEmployment.be  This test is no t yet approved or cleared by the [place] FDA and  has been authorized for detection and/or diagnosis of SARS-CoV-2 by FDA under an Emergency Use Authorization (EUA). This EUA will remain  in effect (meaning this test can be used) for the duration of the COVID-19 declaration under Section 564(b)(1) of the Act, 21 U.S.C.section 360bbb-3(b)(1), unless the authorization is terminated  or revoked sooner.       Influenza A by PCR NEGATIVE NEGATIVE Final   Influenza B by PCR NEGATIVE NEGATIVE Final    Comment: (NOTE) The Xpert Xpress SARS-CoV-2/FLU/RSV plus assay is intended as an aid in the diagnosis of influenza from Nasopharyngeal swab specimens and should not be used as a sole basis  for treatment. Nasal washings and aspirates are unacceptable for Xpert Xpress SARS-CoV-2/FLU/RSV testing.  Fact Sheet for Patients: EntrepreneurPulse.com.au  Fact Sheet for Healthcare Providers: IncredibleEmployment.be  This test is not yet approved or cleared by the [place] FDA and has been authorized for detection and/or diagnosis of SARS-CoV-2 by FDA under an Emergency Use Authorization (EUA). This EUA will remain in effect (meaning this test can be used) for the duration of the COVID-19 declaration under Section 564(b)(1) of the Act, 21 U.S.C. section 360bbb-3(b)(1), unless the authorization is terminated or revoked.  Performed at [facility], [place] [address], [place], [place] [address]   Culture, blood (routine x 2)     Status: None (Preliminary result)   Collection Time: 03/01/20  2:36 AM   Specimen: BLOOD  Result Value Ref Range Status  Specimen Description BLOOD LEFT ANTECUBITAL  Final   Special Requests   Final    BOTTLES DRAWN AEROBIC AND ANAEROBIC Blood Culture adequate volume   Culture   Final    NO GROWTH 2 DAYS Performed at [facility], [address], [place], [place] [address]    Report Status PENDING  Incomplete  Culture, blood (routine x 2)     Status: None (Preliminary result)   Collection Time: 03/01/20  2:44 AM   Specimen: BLOOD  Result Value Ref Range Status   Specimen Description BLOOD LEFT ARM  Final   Special Requests   Final    BOTTLES DRAWN AEROBIC ONLY Blood Culture adequate volume   Culture   Final    NO GROWTH 2 DAYS Performed at [facility], [address] [place], [place] [address]    Report Status PENDING  Incomplete     Discharge Instructions:   Discharge Instructions    Avoid straining   Complete by: As directed    Diet - low sodium heart healthy   Complete by: As directed    Discharge instructions   Complete by: As directed    Follow-up with your primary care  physician in 1 week.  Low-salt diet.  Fluid restriction 1500 Mls per day.  Continue to take medications as prescribed including water pill and blood thinner.  Follow-up with cardiology as scheduled by the clinic.   Heart Failure patients record your daily weight using the same scale at the same time of day   Complete by: As directed    Increase activity slowly   Complete by: As directed    STOP any activity that causes chest pain, shortness of breath, dizziness, sweating, or exessive weakness   Complete by: As directed      Allergies as of 03/03/2020      Reactions   Atorvastatin Other (See Comments)   Muscle cramps   Simvastatin Other (See Comments)   Muscle cramps      Medication List    STOP taking these medications   clopidogrel 75 MG tablet Commonly known as: PLAVIX   doxycycline 100 MG tablet Commonly known as: VIBRA-TABS     TAKE these medications   albuterol 108 (90 Base) MCG/ACT inhaler Commonly known as: VENTOLIN HFA Inhale 1-2 puffs into the lungs every 6 (six) hours as needed for wheezing or shortness of breath.   apixaban 5 MG Tabs tablet Commonly known as: ELIQUIS Take 1 tablet (5 mg total) by mouth 2 (two) times daily.   aspirin 81 MG tablet Take 81 mg by mouth daily.   carbidopa-levodopa 25-100 MG tablet Commonly known as: SINEMET IR 2 at 7am, 1 at 11am, 2 at 3pm, 1 at 7pm   diclofenac 0.1 % ophthalmic solution Commonly known as: VOLTAREN   diphenhydrAMINE 50 MG tablet Commonly known as: BENADRYL Take 1 tablet (50 mg total) by mouth at bedtime as needed for itching.   Durezol 0.05 % Emul Generic drug: Difluprednate   esomeprazole 40 MG capsule Commonly known as: NEXIUM TAKE 1 CAPSULE BY MOUTH ONCE DAILY   hydrALAZINE 25 MG tablet Commonly known as: APRESOLINE Take 1 tablet (25 mg total) by mouth every 8 (eight) hours.   ketoconazole 2 % cream Commonly known as: NIZORAL Apply 1 application topically daily.   lovastatin 20 MG  tablet Commonly known as: MEVACOR TAKE 1 TABLET AT BEDTIME   multivitamin with minerals Tabs tablet Take 1 tablet by mouth daily.  nitroGLYCERIN 0.4 MG SL tablet Commonly known as: Nitrostat PLACE 1 TABLET (0.4 MG TOTAL) UNDER THE TONGUE EVERY 5 (FIVE) MINUTES AS NEEDED.   nystatin 100000 UNIT/ML suspension Commonly known as: MYCOSTATIN Take 5 mLs (500,000 Units total) by mouth 4 (four) times daily.   nystatin-triamcinolone ointment Commonly known as: MYCOLOG Apply 1 application topically 2 (two) times daily.   polyethylene glycol 17 g packet Commonly known as: MIRALAX / GLYCOLAX Take 17 g by mouth daily as needed for mild constipation.   Prolensa 0.07 % Soln Generic drug: Bromfenac Sodium Place 1 drop into the left eye at bedtime.   solifenacin 5 MG tablet Commonly known as: VESICARE Take 1 tablet (5 mg total) by mouth daily.   Systane Ultra 0.4-0.3 % Soln Generic drug: Polyethyl Glycol-Propyl Glycol Apply to eye as directed.   tiZANidine 4 MG tablet Commonly known as: Zanaflex Take 1 tablet (4 mg total) by mouth every 6 (six) hours as needed for muscle spasms.   torsemide 20 MG tablet Commonly known as: DEMADEX Take 1 tablet (20 mg total) by mouth 2 (two) times daily.   traMADol 50 MG tablet Commonly known as: ULTRAM TAKE 1 TABLET BY MOUTH EVERY 6 HOURS AS NEEDED FOR PAIN   VITAMIN B COMPLEX PO Take 1 tablet by mouth as needed.       Follow-up Information    Care, [facility] Follow up.   Specialty: [place] Why: A representative from [facility] will contact you to arrange start date and time for youor therapy. Contact information: [place] [address] [phone]        Biagio Borg, MD. Schedule an appointment as soon as possible for a visit in 1 week(s).   Specialties: Internal Medicine, Radiology Why: regular followup, blood work Contact information: [place]  [id number] [phone]        Minus Breeding, MD. Schedule an appointment as soon as possible for a visit in 1 week(s).   Specialty: Cardiology Why: heart followup Contact information: [place] [address] [phone]                Time coordinating discharge: 39 minutes  Signed:  Dmarius Reeder  [facility] 03/03/2020, 9:38 AM

## 2020-03-06 DIAGNOSIS — R6520 Severe sepsis without septic shock: Secondary | ICD-10-CM | POA: Diagnosis not present

## 2020-03-06 DIAGNOSIS — J90 Pleural effusion, not elsewhere classified: Secondary | ICD-10-CM | POA: Diagnosis not present

## 2020-03-06 DIAGNOSIS — J9601 Acute respiratory failure with hypoxia: Secondary | ICD-10-CM | POA: Diagnosis not present

## 2020-03-06 DIAGNOSIS — N3289 Other specified disorders of bladder: Secondary | ICD-10-CM | POA: Diagnosis not present

## 2020-03-06 DIAGNOSIS — N179 Acute kidney failure, unspecified: Secondary | ICD-10-CM | POA: Diagnosis not present

## 2020-03-06 DIAGNOSIS — K5730 Diverticulosis of large intestine without perforation or abscess without bleeding: Secondary | ICD-10-CM | POA: Diagnosis not present

## 2020-03-06 DIAGNOSIS — A419 Sepsis, unspecified organism: Secondary | ICD-10-CM | POA: Diagnosis not present

## 2020-03-06 DIAGNOSIS — N3090 Cystitis, unspecified without hematuria: Secondary | ICD-10-CM | POA: Diagnosis not present

## 2020-03-06 DIAGNOSIS — J9811 Atelectasis: Secondary | ICD-10-CM | POA: Diagnosis not present

## 2020-03-06 DIAGNOSIS — R0602 Shortness of breath: Secondary | ICD-10-CM | POA: Diagnosis not present

## 2020-03-06 DIAGNOSIS — N390 Urinary tract infection, site not specified: Secondary | ICD-10-CM | POA: Diagnosis not present

## 2020-03-06 DIAGNOSIS — G9341 Metabolic encephalopathy: Secondary | ICD-10-CM | POA: Diagnosis not present

## 2020-03-06 DIAGNOSIS — A4150 Gram-negative sepsis, unspecified: Secondary | ICD-10-CM | POA: Diagnosis not present

## 2020-03-06 DIAGNOSIS — I509 Heart failure, unspecified: Secondary | ICD-10-CM | POA: Diagnosis not present

## 2020-03-06 DIAGNOSIS — A4151 Sepsis due to Escherichia coli [E. coli]: Secondary | ICD-10-CM | POA: Diagnosis not present

## 2020-03-06 DIAGNOSIS — I10 Essential (primary) hypertension: Secondary | ICD-10-CM | POA: Diagnosis not present

## 2020-03-06 DIAGNOSIS — G20 Parkinson's disease: Secondary | ICD-10-CM | POA: Diagnosis not present

## 2020-03-06 DIAGNOSIS — E785 Hyperlipidemia, unspecified: Secondary | ICD-10-CM | POA: Diagnosis not present

## 2020-03-06 DIAGNOSIS — E1165 Type 2 diabetes mellitus with hyperglycemia: Secondary | ICD-10-CM | POA: Diagnosis not present

## 2020-03-06 DIAGNOSIS — B9689 Other specified bacterial agents as the cause of diseases classified elsewhere: Secondary | ICD-10-CM | POA: Diagnosis not present

## 2020-03-06 DIAGNOSIS — I4510 Unspecified right bundle-branch block: Secondary | ICD-10-CM | POA: Diagnosis not present

## 2020-03-06 DIAGNOSIS — Z20822 Contact with and (suspected) exposure to covid-19: Secondary | ICD-10-CM | POA: Diagnosis not present

## 2020-03-06 DIAGNOSIS — R4182 Altered mental status, unspecified: Secondary | ICD-10-CM | POA: Diagnosis not present

## 2020-03-06 DIAGNOSIS — R2243 Localized swelling, mass and lump, lower limb, bilateral: Secondary | ICD-10-CM | POA: Diagnosis not present

## 2020-03-06 DIAGNOSIS — I5032 Chronic diastolic (congestive) heart failure: Secondary | ICD-10-CM | POA: Diagnosis not present

## 2020-03-06 DIAGNOSIS — E876 Hypokalemia: Secondary | ICD-10-CM | POA: Diagnosis not present

## 2020-03-06 DIAGNOSIS — J189 Pneumonia, unspecified organism: Secondary | ICD-10-CM | POA: Diagnosis not present

## 2020-03-06 DIAGNOSIS — I517 Cardiomegaly: Secondary | ICD-10-CM | POA: Diagnosis not present

## 2020-03-06 LAB — CULTURE, BLOOD (ROUTINE X 2)
Culture: NO GROWTH
Culture: NO GROWTH
Special Requests: ADEQUATE
Special Requests: ADEQUATE

## 2020-03-07 ENCOUNTER — Telehealth: Payer: Self-pay | Admitting: Internal Medicine

## 2020-03-07 DIAGNOSIS — I10 Essential (primary) hypertension: Secondary | ICD-10-CM | POA: Diagnosis not present

## 2020-03-07 DIAGNOSIS — I4510 Unspecified right bundle-branch block: Secondary | ICD-10-CM | POA: Diagnosis not present

## 2020-03-07 DIAGNOSIS — A4150 Gram-negative sepsis, unspecified: Secondary | ICD-10-CM | POA: Diagnosis not present

## 2020-03-07 DIAGNOSIS — G9341 Metabolic encephalopathy: Secondary | ICD-10-CM | POA: Diagnosis not present

## 2020-03-07 DIAGNOSIS — A4151 Sepsis due to Escherichia coli [E. coli]: Secondary | ICD-10-CM | POA: Diagnosis not present

## 2020-03-07 DIAGNOSIS — N179 Acute kidney failure, unspecified: Secondary | ICD-10-CM | POA: Diagnosis not present

## 2020-03-07 DIAGNOSIS — R2243 Localized swelling, mass and lump, lower limb, bilateral: Secondary | ICD-10-CM | POA: Diagnosis not present

## 2020-03-07 DIAGNOSIS — J9601 Acute respiratory failure with hypoxia: Secondary | ICD-10-CM | POA: Diagnosis not present

## 2020-03-07 DIAGNOSIS — G20 Parkinson's disease: Secondary | ICD-10-CM | POA: Diagnosis not present

## 2020-03-07 DIAGNOSIS — E876 Hypokalemia: Secondary | ICD-10-CM | POA: Diagnosis not present

## 2020-03-07 DIAGNOSIS — I509 Heart failure, unspecified: Secondary | ICD-10-CM | POA: Diagnosis not present

## 2020-03-07 DIAGNOSIS — N3090 Cystitis, unspecified without hematuria: Secondary | ICD-10-CM | POA: Diagnosis not present

## 2020-03-07 NOTE — Telephone Encounter (Signed)
[name] states her husband was discharged on Thursday after exacerbation of A-Fib. Caller reports he was changed from Plavix to Elaquis and he is freezing. BP 136/84 currently and the MD has him on 3 BP meds/day. Concerned about the amt of bp meds he is taking. States he is weak, ambulates w/a walker and has hx of Parkinson's dx. States she was able to get him warmed up and he is actually hot now.   Declined triage. Stated they would call back when they could. Nurse advised to continue giving the meds a/o since they are working.

## 2020-03-08 DIAGNOSIS — N179 Acute kidney failure, unspecified: Secondary | ICD-10-CM | POA: Diagnosis not present

## 2020-03-08 DIAGNOSIS — G9341 Metabolic encephalopathy: Secondary | ICD-10-CM | POA: Diagnosis not present

## 2020-03-08 DIAGNOSIS — A419 Sepsis, unspecified organism: Secondary | ICD-10-CM | POA: Diagnosis not present

## 2020-03-08 DIAGNOSIS — J9601 Acute respiratory failure with hypoxia: Secondary | ICD-10-CM | POA: Diagnosis not present

## 2020-03-08 DIAGNOSIS — G20 Parkinson's disease: Secondary | ICD-10-CM | POA: Diagnosis not present

## 2020-03-08 DIAGNOSIS — I10 Essential (primary) hypertension: Secondary | ICD-10-CM | POA: Diagnosis not present

## 2020-03-08 DIAGNOSIS — I509 Heart failure, unspecified: Secondary | ICD-10-CM | POA: Diagnosis not present

## 2020-03-08 DIAGNOSIS — N390 Urinary tract infection, site not specified: Secondary | ICD-10-CM | POA: Diagnosis not present

## 2020-03-08 DIAGNOSIS — A4151 Sepsis due to Escherichia coli [E. coli]: Secondary | ICD-10-CM | POA: Diagnosis not present

## 2020-03-09 DIAGNOSIS — I509 Heart failure, unspecified: Secondary | ICD-10-CM | POA: Diagnosis not present

## 2020-03-09 DIAGNOSIS — N179 Acute kidney failure, unspecified: Secondary | ICD-10-CM | POA: Diagnosis not present

## 2020-03-09 DIAGNOSIS — J9601 Acute respiratory failure with hypoxia: Secondary | ICD-10-CM | POA: Diagnosis not present

## 2020-03-09 DIAGNOSIS — A4150 Gram-negative sepsis, unspecified: Secondary | ICD-10-CM | POA: Diagnosis not present

## 2020-03-09 DIAGNOSIS — I10 Essential (primary) hypertension: Secondary | ICD-10-CM | POA: Diagnosis not present

## 2020-03-09 DIAGNOSIS — A419 Sepsis, unspecified organism: Secondary | ICD-10-CM | POA: Diagnosis not present

## 2020-03-09 DIAGNOSIS — G9341 Metabolic encephalopathy: Secondary | ICD-10-CM | POA: Diagnosis not present

## 2020-03-09 DIAGNOSIS — B9689 Other specified bacterial agents as the cause of diseases classified elsewhere: Secondary | ICD-10-CM | POA: Diagnosis not present

## 2020-03-09 DIAGNOSIS — G20 Parkinson's disease: Secondary | ICD-10-CM | POA: Diagnosis not present

## 2020-03-09 DIAGNOSIS — N390 Urinary tract infection, site not specified: Secondary | ICD-10-CM | POA: Diagnosis not present

## 2020-03-10 DIAGNOSIS — A4151 Sepsis due to Escherichia coli [E. coli]: Secondary | ICD-10-CM | POA: Diagnosis not present

## 2020-03-10 DIAGNOSIS — I10 Essential (primary) hypertension: Secondary | ICD-10-CM | POA: Diagnosis not present

## 2020-03-10 DIAGNOSIS — N179 Acute kidney failure, unspecified: Secondary | ICD-10-CM | POA: Diagnosis not present

## 2020-03-10 DIAGNOSIS — G20 Parkinson's disease: Secondary | ICD-10-CM | POA: Diagnosis not present

## 2020-03-10 DIAGNOSIS — I509 Heart failure, unspecified: Secondary | ICD-10-CM | POA: Diagnosis not present

## 2020-03-10 DIAGNOSIS — N390 Urinary tract infection, site not specified: Secondary | ICD-10-CM | POA: Diagnosis not present

## 2020-03-10 DIAGNOSIS — J9601 Acute respiratory failure with hypoxia: Secondary | ICD-10-CM | POA: Diagnosis not present

## 2020-03-11 ENCOUNTER — Ambulatory Visit: Payer: Medicare HMO | Admitting: Internal Medicine

## 2020-03-14 DIAGNOSIS — I5033 Acute on chronic diastolic (congestive) heart failure: Secondary | ICD-10-CM | POA: Diagnosis not present

## 2020-03-14 DIAGNOSIS — N179 Acute kidney failure, unspecified: Secondary | ICD-10-CM | POA: Diagnosis not present

## 2020-03-14 DIAGNOSIS — I081 Rheumatic disorders of both mitral and tricuspid valves: Secondary | ICD-10-CM | POA: Diagnosis not present

## 2020-03-14 DIAGNOSIS — I480 Paroxysmal atrial fibrillation: Secondary | ICD-10-CM | POA: Diagnosis not present

## 2020-03-14 DIAGNOSIS — I110 Hypertensive heart disease with heart failure: Secondary | ICD-10-CM | POA: Diagnosis not present

## 2020-03-14 DIAGNOSIS — G20 Parkinson's disease: Secondary | ICD-10-CM | POA: Diagnosis not present

## 2020-03-14 DIAGNOSIS — J189 Pneumonia, unspecified organism: Secondary | ICD-10-CM | POA: Diagnosis not present

## 2020-03-14 DIAGNOSIS — F0281 Dementia in other diseases classified elsewhere with behavioral disturbance: Secondary | ICD-10-CM | POA: Diagnosis not present

## 2020-03-14 DIAGNOSIS — A4151 Sepsis due to Escherichia coli [E. coli]: Secondary | ICD-10-CM | POA: Diagnosis not present

## 2020-03-15 ENCOUNTER — Ambulatory Visit: Payer: Medicare HMO | Admitting: Cardiology

## 2020-03-16 DIAGNOSIS — J189 Pneumonia, unspecified organism: Secondary | ICD-10-CM | POA: Diagnosis not present

## 2020-03-16 DIAGNOSIS — G20 Parkinson's disease: Secondary | ICD-10-CM | POA: Diagnosis not present

## 2020-03-16 DIAGNOSIS — F0281 Dementia in other diseases classified elsewhere with behavioral disturbance: Secondary | ICD-10-CM | POA: Diagnosis not present

## 2020-03-16 DIAGNOSIS — I480 Paroxysmal atrial fibrillation: Secondary | ICD-10-CM | POA: Diagnosis not present

## 2020-03-16 DIAGNOSIS — I081 Rheumatic disorders of both mitral and tricuspid valves: Secondary | ICD-10-CM | POA: Diagnosis not present

## 2020-03-16 DIAGNOSIS — N179 Acute kidney failure, unspecified: Secondary | ICD-10-CM | POA: Diagnosis not present

## 2020-03-16 DIAGNOSIS — A4151 Sepsis due to Escherichia coli [E. coli]: Secondary | ICD-10-CM | POA: Diagnosis not present

## 2020-03-16 DIAGNOSIS — I110 Hypertensive heart disease with heart failure: Secondary | ICD-10-CM | POA: Diagnosis not present

## 2020-03-16 DIAGNOSIS — I5033 Acute on chronic diastolic (congestive) heart failure: Secondary | ICD-10-CM | POA: Diagnosis not present

## 2020-03-17 DIAGNOSIS — I480 Paroxysmal atrial fibrillation: Secondary | ICD-10-CM | POA: Diagnosis not present

## 2020-03-17 DIAGNOSIS — A4151 Sepsis due to Escherichia coli [E. coli]: Secondary | ICD-10-CM | POA: Diagnosis not present

## 2020-03-17 DIAGNOSIS — I5033 Acute on chronic diastolic (congestive) heart failure: Secondary | ICD-10-CM | POA: Diagnosis not present

## 2020-03-17 DIAGNOSIS — G20 Parkinson's disease: Secondary | ICD-10-CM | POA: Diagnosis not present

## 2020-03-17 DIAGNOSIS — N179 Acute kidney failure, unspecified: Secondary | ICD-10-CM | POA: Diagnosis not present

## 2020-03-17 DIAGNOSIS — I081 Rheumatic disorders of both mitral and tricuspid valves: Secondary | ICD-10-CM | POA: Diagnosis not present

## 2020-03-17 DIAGNOSIS — J189 Pneumonia, unspecified organism: Secondary | ICD-10-CM | POA: Diagnosis not present

## 2020-03-17 DIAGNOSIS — F0281 Dementia in other diseases classified elsewhere with behavioral disturbance: Secondary | ICD-10-CM | POA: Diagnosis not present

## 2020-03-17 DIAGNOSIS — I110 Hypertensive heart disease with heart failure: Secondary | ICD-10-CM | POA: Diagnosis not present

## 2020-03-21 ENCOUNTER — Telehealth: Payer: Self-pay | Admitting: Cardiology

## 2020-03-21 ENCOUNTER — Telehealth: Payer: Self-pay | Admitting: Internal Medicine

## 2020-03-21 DIAGNOSIS — I5033 Acute on chronic diastolic (congestive) heart failure: Secondary | ICD-10-CM | POA: Diagnosis not present

## 2020-03-21 DIAGNOSIS — G20 Parkinson's disease: Secondary | ICD-10-CM | POA: Diagnosis not present

## 2020-03-21 DIAGNOSIS — N179 Acute kidney failure, unspecified: Secondary | ICD-10-CM | POA: Diagnosis not present

## 2020-03-21 DIAGNOSIS — F0281 Dementia in other diseases classified elsewhere with behavioral disturbance: Secondary | ICD-10-CM | POA: Diagnosis not present

## 2020-03-21 DIAGNOSIS — J189 Pneumonia, unspecified organism: Secondary | ICD-10-CM | POA: Diagnosis not present

## 2020-03-21 DIAGNOSIS — I081 Rheumatic disorders of both mitral and tricuspid valves: Secondary | ICD-10-CM | POA: Diagnosis not present

## 2020-03-21 DIAGNOSIS — A4151 Sepsis due to Escherichia coli [E. coli]: Secondary | ICD-10-CM | POA: Diagnosis not present

## 2020-03-21 DIAGNOSIS — I110 Hypertensive heart disease with heart failure: Secondary | ICD-10-CM | POA: Diagnosis not present

## 2020-03-21 DIAGNOSIS — I480 Paroxysmal atrial fibrillation: Secondary | ICD-10-CM | POA: Diagnosis not present

## 2020-03-21 NOTE — Telephone Encounter (Signed)
Lana a physical therapist with [facility] calling to request verbal orders. Would like to continue PT for 2 times a week for 4 weeks and 1 time a week for 4 weeks.  She was also requesting if the patient could start with condom caths.  Elane Fritz- [phone] Okay to lvm

## 2020-03-21 NOTE — Telephone Encounter (Signed)
Spoke with patients wife and he was just recently hospitalized at [facility] then again in [facility] PT is seeing patient and do not think he is strong enough to come to visit this week. Would like to reschedule or make virtual visit Discussed with Dr Percival Spanish and ok to reschedule for couple weeks out Advised wife, rescheduled and told to call if anything changes

## 2020-03-21 NOTE — Telephone Encounter (Signed)
3.14.22 Mrs. Trapp called to change  Brandon Rojas's 3.16.22 appt w/Dr Percival Spanish to virtual because Brandon Rojas just got out of the hospital and is very weak. She does not want to take him out. Please call to discuss changing his appointment.

## 2020-03-21 NOTE — Telephone Encounter (Signed)
[place] for verbal for all

## 2020-03-22 NOTE — Telephone Encounter (Signed)
Called [facility] w/MD response...Brandon Rojas

## 2020-03-23 ENCOUNTER — Ambulatory Visit: Payer: Medicare HMO | Admitting: Cardiology

## 2020-03-23 NOTE — Progress Notes (Signed)
Virtual Visit Via Video   The purpose of this virtual visit is to provide medical care while limiting exposure to the novel coronavirus.    Consent was obtained for video visit:  Yes.   Answered questions that patient had about telehealth interaction:  Yes.   I discussed the limitations, risks, security and privacy concerns of performing an evaluation and management service by telemedicine. I also discussed with the patient that there may be a patient responsible charge related to this service. The patient expressed understanding and agreed to proceed.  Pt location: Home Physician Location: office Name of referring provider:  Biagio Borg, MD I connected with Brandon Rojas at patients initiation/request on 03/24/2020 at 10:15 AM EDT by video enabled telemedicine application and verified that I am speaking with the correct person using two identifiers. Pt MRN:  [id number] Pt DOB:  1945-12-03 Video Participants:  Brandon Rojas;  wife  Assessment/Plan:   1.  [name]             -Continue carbidopa/levodopa 25/100, 2 tablets at 7 AM/1 tablet at 11 AM/2 tablets at 3 PM/1 tablet at 7 PM  -Patient experienced Parkinson's worsening symptoms during hospitalization.  This is not uncommon, given that patient had new onset atrial fibrillation.  He did have some confusion and visual hallucinations upon arrival to the hospital, but was in new onset atrial fibrillation and had been taking Benadryl.  This quickly resolved post hospitalization.  He looks good today and we opted not to change his medication.  -Continue with home physical therapy.  2.  History of TIA in August, 2020, April, 2021             -Patient has followed up with Dr. Leonie Man             -Continue lovastatin             -On aspirin  Subjective   Patient seen today in follow-up for Parkinson's disease.  They requested a work in appointment today, posthospitalization last month.  Medical records from the hospitalization  are reviewed.  The patient was originally admitted for shortness of breath/congestive heart failure/new onset atrial fibrillation.  He was seen on date of admission by neurology for some increased confusion and hallucinations.  It was felt that the Benadryl could be worsening his confusion.  MRI brain was completed and had a lot of motion artifact, but was nonacute.  Wife was frustrated as his levodopa was not given at proper times during the hospital.  Neurology did indicate that the patients confusion quickly resolved after being in the hospital for only 1 day.  He was not seen by neurology after that, although he was in the hospital for a total of 6 days.  Pt states that he is moving well now that he at home.  He is doing PT at home. Wife agrees.  He is having no cramping of the feet or legs.  Current movement d/o meds:  carbidopa/levodopa 25/100, 2 tablets at 7 AM/1 tablet at 11 AM/2 tablets at 3 PM/1 tablet at 7 PM (started last visit)   Current Outpatient Medications on File Prior to Visit  Medication Sig Dispense Refill  . apixaban (ELIQUIS) 5 MG TABS tablet Take 1 tablet (5 mg total) by mouth 2 (two) times daily. 60 tablet 2  . aspirin 81 MG tablet Take 81 mg by mouth daily.    . carbidopa-levodopa (SINEMET IR) 25-100 MG tablet 2 at 7am, 1  at 11am, 2 at 3pm, 1 at 7pm 540 tablet 1  . esomeprazole (NEXIUM) 40 MG capsule TAKE 1 CAPSULE BY MOUTH ONCE DAILY 90 capsule 3  . ketoconazole (NIZORAL) 2 % cream Apply 1 application topically daily. 30 g 1  . lovastatin (MEVACOR) 20 MG tablet TAKE 1 TABLET AT BEDTIME 90 tablet 1  . Multiple Vitamin (MULTIVITAMIN WITH MINERALS) TABS tablet Take 1 tablet by mouth daily.    . nitroGLYCERIN (NITROSTAT) 0.4 MG SL tablet PLACE 1 TABLET (0.4 MG TOTAL) UNDER THE TONGUE EVERY 5 (FIVE) MINUTES AS NEEDED. 50 tablet 3  . nystatin-triamcinolone ointment (MYCOLOG) Apply 1 application topically 2 (two) times daily. 30 g 0  . Polyethyl Glycol-Propyl Glycol (SYSTANE  ULTRA) 0.4-0.3 % SOLN Apply to eye as directed.    [name] tiZANidine (ZANAFLEX) 4 MG tablet Take 1 tablet (4 mg total) by mouth every 6 (six) hours as needed for muscle spasms. 120 tablet 1  . traMADol (ULTRAM) 50 MG tablet TAKE 1 TABLET BY MOUTH EVERY 6 HOURS AS NEEDED FOR PAIN 120 tablet 5  . albuterol (VENTOLIN HFA) 108 (90 Base) MCG/ACT inhaler Inhale 1-2 puffs into the lungs every 6 (six) hours as needed for wheezing or shortness of breath. (Patient not taking: Reported on 03/24/2020) 18 g 4  . B Complex Vitamins (VITAMIN B COMPLEX PO) Take 1 tablet by mouth as needed. (Patient not taking: No sig reported)    . diclofenac (VOLTAREN) 0.1 % ophthalmic solution  (Patient not taking: Reported on 03/24/2020)    . diphenhydrAMINE (BENADRYL) 50 MG tablet Take 1 tablet (50 mg total) by mouth at bedtime as needed for itching. (Patient not taking: Reported on 03/24/2020) 7 tablet 0  . DUREZOL 0.05 % EMUL  (Patient not taking: Reported on 03/24/2020)    . hydrALAZINE (APRESOLINE) 25 MG tablet Take 1 tablet (25 mg total) by mouth every 8 (eight) hours. (Patient not taking: Reported on 03/24/2020) 90 tablet 2  . nystatin (MYCOSTATIN) 100000 UNIT/ML suspension Take 5 mLs (500,000 Units total) by mouth 4 (four) times daily. (Patient not taking: Reported on 03/24/2020) 120 mL 0  . polyethylene glycol (MIRALAX / GLYCOLAX) 17 g packet Take 17 g by mouth daily as needed for mild constipation. (Patient not taking: Reported on 03/24/2020) 14 each 0  . PROLENSA 0.07 % SOLN Place 1 drop into the left eye at bedtime. (Patient not taking: No sig reported)    . solifenacin (VESICARE) 5 MG tablet Take 1 tablet (5 mg total) by mouth daily. (Patient not taking: No sig reported) 90 tablet 3  . torsemide (DEMADEX) 20 MG tablet Take 1 tablet (20 mg total) by mouth 2 (two) times daily. (Patient not taking: Reported on 03/24/2020) 30 tablet 2   No current facility-administered medications on file prior to visit.     Objective   Vitals:    03/24/20 0905  Weight: 150 lb (68 kg)  Height: 5\' 4"  (1.626 m)   GEN:  The patient appears stated age and is in NAD.  Neurological examination:  Orientation: The patient is alert and oriented x3. Cranial nerves: There is good facial symmetry. There is facial hypomimia.  The speech is fluent and clear. Soft palate rises symmetrically and there is no tongue deviation. Hearing is intact to conversational tone. Motor: Strength is at least antigravity x 4.   Shoulder shrug is equal and symmetric.  There is no pronator drift.  Movement examination: Tone: unable Abnormal movements: None seen Coordination:  There is  decremation with RAM's, with rapid alternating movements in the upper extremities, right greater than left Gait and Station: The patient pushes off of the chair to arise.  He is antalgic with his gait with his cane, but is able to walk pretty well in his home.    Follow up Instructions      -I discussed the assessment and treatment plan with the patient. The patient was provided an opportunity to ask questions and all were answered. The patient agreed with the plan and demonstrated an understanding of the instructions.   The patient was advised to call back or seek an in-person evaluation if the symptoms worsen or if the condition fails to improve as anticipated.    Total time spent on today's visit was 20 minutes, including both face-to-face time and nonface-to-face time.  Time included that spent on review of records (prior notes available to me/labs/imaging if pertinent), discussing treatment and goals, answering patient's questions and coordinating care.   Alonza Bogus, DO

## 2020-03-24 ENCOUNTER — Encounter: Payer: Self-pay | Admitting: Neurology

## 2020-03-24 ENCOUNTER — Telehealth (INDEPENDENT_AMBULATORY_CARE_PROVIDER_SITE_OTHER): Payer: Medicare HMO | Admitting: Neurology

## 2020-03-24 ENCOUNTER — Other Ambulatory Visit: Payer: Self-pay

## 2020-03-24 VITALS — Ht 64.0 in | Wt 150.0 lb

## 2020-03-24 DIAGNOSIS — I480 Paroxysmal atrial fibrillation: Secondary | ICD-10-CM | POA: Diagnosis not present

## 2020-03-24 DIAGNOSIS — I5033 Acute on chronic diastolic (congestive) heart failure: Secondary | ICD-10-CM | POA: Diagnosis not present

## 2020-03-24 DIAGNOSIS — J189 Pneumonia, unspecified organism: Secondary | ICD-10-CM | POA: Diagnosis not present

## 2020-03-24 DIAGNOSIS — N179 Acute kidney failure, unspecified: Secondary | ICD-10-CM | POA: Diagnosis not present

## 2020-03-24 DIAGNOSIS — I081 Rheumatic disorders of both mitral and tricuspid valves: Secondary | ICD-10-CM | POA: Diagnosis not present

## 2020-03-24 DIAGNOSIS — I110 Hypertensive heart disease with heart failure: Secondary | ICD-10-CM | POA: Diagnosis not present

## 2020-03-24 DIAGNOSIS — F0281 Dementia in other diseases classified elsewhere with behavioral disturbance: Secondary | ICD-10-CM | POA: Diagnosis not present

## 2020-03-24 DIAGNOSIS — G20 Parkinson's disease: Secondary | ICD-10-CM

## 2020-03-24 DIAGNOSIS — A4151 Sepsis due to Escherichia coli [E. coli]: Secondary | ICD-10-CM | POA: Diagnosis not present

## 2020-03-25 DIAGNOSIS — J189 Pneumonia, unspecified organism: Secondary | ICD-10-CM | POA: Diagnosis not present

## 2020-03-25 DIAGNOSIS — I110 Hypertensive heart disease with heart failure: Secondary | ICD-10-CM | POA: Diagnosis not present

## 2020-03-25 DIAGNOSIS — A4151 Sepsis due to Escherichia coli [E. coli]: Secondary | ICD-10-CM | POA: Diagnosis not present

## 2020-03-25 DIAGNOSIS — I5033 Acute on chronic diastolic (congestive) heart failure: Secondary | ICD-10-CM | POA: Diagnosis not present

## 2020-03-25 DIAGNOSIS — G20 Parkinson's disease: Secondary | ICD-10-CM | POA: Diagnosis not present

## 2020-03-25 DIAGNOSIS — I480 Paroxysmal atrial fibrillation: Secondary | ICD-10-CM | POA: Diagnosis not present

## 2020-03-25 DIAGNOSIS — I081 Rheumatic disorders of both mitral and tricuspid valves: Secondary | ICD-10-CM | POA: Diagnosis not present

## 2020-03-25 DIAGNOSIS — F0281 Dementia in other diseases classified elsewhere with behavioral disturbance: Secondary | ICD-10-CM | POA: Diagnosis not present

## 2020-03-25 DIAGNOSIS — N179 Acute kidney failure, unspecified: Secondary | ICD-10-CM | POA: Diagnosis not present

## 2020-03-29 DIAGNOSIS — G20 Parkinson's disease: Secondary | ICD-10-CM | POA: Diagnosis not present

## 2020-03-29 DIAGNOSIS — N179 Acute kidney failure, unspecified: Secondary | ICD-10-CM | POA: Diagnosis not present

## 2020-03-29 DIAGNOSIS — I081 Rheumatic disorders of both mitral and tricuspid valves: Secondary | ICD-10-CM | POA: Diagnosis not present

## 2020-03-29 DIAGNOSIS — I480 Paroxysmal atrial fibrillation: Secondary | ICD-10-CM | POA: Diagnosis not present

## 2020-03-29 DIAGNOSIS — I5033 Acute on chronic diastolic (congestive) heart failure: Secondary | ICD-10-CM | POA: Diagnosis not present

## 2020-03-29 DIAGNOSIS — A4151 Sepsis due to Escherichia coli [E. coli]: Secondary | ICD-10-CM | POA: Diagnosis not present

## 2020-03-29 DIAGNOSIS — F0281 Dementia in other diseases classified elsewhere with behavioral disturbance: Secondary | ICD-10-CM | POA: Diagnosis not present

## 2020-03-29 DIAGNOSIS — J189 Pneumonia, unspecified organism: Secondary | ICD-10-CM | POA: Diagnosis not present

## 2020-03-29 DIAGNOSIS — I110 Hypertensive heart disease with heart failure: Secondary | ICD-10-CM | POA: Diagnosis not present

## 2020-03-30 DIAGNOSIS — N179 Acute kidney failure, unspecified: Secondary | ICD-10-CM | POA: Diagnosis not present

## 2020-03-30 DIAGNOSIS — G20 Parkinson's disease: Secondary | ICD-10-CM | POA: Diagnosis not present

## 2020-03-30 DIAGNOSIS — I110 Hypertensive heart disease with heart failure: Secondary | ICD-10-CM | POA: Diagnosis not present

## 2020-03-30 DIAGNOSIS — F0281 Dementia in other diseases classified elsewhere with behavioral disturbance: Secondary | ICD-10-CM | POA: Diagnosis not present

## 2020-03-30 DIAGNOSIS — I5033 Acute on chronic diastolic (congestive) heart failure: Secondary | ICD-10-CM | POA: Diagnosis not present

## 2020-03-30 DIAGNOSIS — I081 Rheumatic disorders of both mitral and tricuspid valves: Secondary | ICD-10-CM | POA: Diagnosis not present

## 2020-03-30 DIAGNOSIS — A4151 Sepsis due to Escherichia coli [E. coli]: Secondary | ICD-10-CM | POA: Diagnosis not present

## 2020-03-30 DIAGNOSIS — J189 Pneumonia, unspecified organism: Secondary | ICD-10-CM | POA: Diagnosis not present

## 2020-03-30 DIAGNOSIS — I480 Paroxysmal atrial fibrillation: Secondary | ICD-10-CM | POA: Diagnosis not present

## 2020-03-31 DIAGNOSIS — J189 Pneumonia, unspecified organism: Secondary | ICD-10-CM | POA: Diagnosis not present

## 2020-03-31 DIAGNOSIS — I110 Hypertensive heart disease with heart failure: Secondary | ICD-10-CM | POA: Diagnosis not present

## 2020-03-31 DIAGNOSIS — G20 Parkinson's disease: Secondary | ICD-10-CM | POA: Diagnosis not present

## 2020-03-31 DIAGNOSIS — F0281 Dementia in other diseases classified elsewhere with behavioral disturbance: Secondary | ICD-10-CM | POA: Diagnosis not present

## 2020-03-31 DIAGNOSIS — I081 Rheumatic disorders of both mitral and tricuspid valves: Secondary | ICD-10-CM | POA: Diagnosis not present

## 2020-03-31 DIAGNOSIS — N179 Acute kidney failure, unspecified: Secondary | ICD-10-CM | POA: Diagnosis not present

## 2020-03-31 DIAGNOSIS — A4151 Sepsis due to Escherichia coli [E. coli]: Secondary | ICD-10-CM | POA: Diagnosis not present

## 2020-03-31 DIAGNOSIS — I480 Paroxysmal atrial fibrillation: Secondary | ICD-10-CM | POA: Diagnosis not present

## 2020-03-31 DIAGNOSIS — I5033 Acute on chronic diastolic (congestive) heart failure: Secondary | ICD-10-CM | POA: Diagnosis not present

## 2020-04-01 ENCOUNTER — Other Ambulatory Visit: Payer: Self-pay | Admitting: Neurology

## 2020-04-01 ENCOUNTER — Other Ambulatory Visit: Payer: Self-pay | Admitting: Internal Medicine

## 2020-04-01 NOTE — Telephone Encounter (Signed)
Please refill as per office routine med refill policy (all routine meds refilled for 3 mo or monthly per pt preference up to one year from last visit, then month to month grace period for 3 mo, then further med refills will have to be denied)  

## 2020-04-01 NOTE — Telephone Encounter (Signed)
Rx(s) sent to pharmacy electronically.  

## 2020-04-04 DIAGNOSIS — I480 Paroxysmal atrial fibrillation: Secondary | ICD-10-CM | POA: Diagnosis not present

## 2020-04-04 DIAGNOSIS — I5033 Acute on chronic diastolic (congestive) heart failure: Secondary | ICD-10-CM | POA: Diagnosis not present

## 2020-04-04 DIAGNOSIS — A4151 Sepsis due to Escherichia coli [E. coli]: Secondary | ICD-10-CM | POA: Diagnosis not present

## 2020-04-04 DIAGNOSIS — I110 Hypertensive heart disease with heart failure: Secondary | ICD-10-CM | POA: Diagnosis not present

## 2020-04-04 DIAGNOSIS — F0281 Dementia in other diseases classified elsewhere with behavioral disturbance: Secondary | ICD-10-CM | POA: Diagnosis not present

## 2020-04-04 DIAGNOSIS — G20 Parkinson's disease: Secondary | ICD-10-CM | POA: Diagnosis not present

## 2020-04-04 DIAGNOSIS — M7989 Other specified soft tissue disorders: Secondary | ICD-10-CM | POA: Insufficient documentation

## 2020-04-04 DIAGNOSIS — N179 Acute kidney failure, unspecified: Secondary | ICD-10-CM | POA: Diagnosis not present

## 2020-04-04 DIAGNOSIS — J189 Pneumonia, unspecified organism: Secondary | ICD-10-CM | POA: Diagnosis not present

## 2020-04-04 DIAGNOSIS — I081 Rheumatic disorders of both mitral and tricuspid valves: Secondary | ICD-10-CM | POA: Diagnosis not present

## 2020-04-04 NOTE — Progress Notes (Signed)
Cardiology Office Note   Date:  04/05/2020   ID:  Brandon, Rojas 1945/06/26, MRN [id number]  PCP:  Biagio Borg, MD  Cardiologist:   Minus Breeding, MD   Chief Complaint  Patient presents with  . Atrial Fibrillation      History of Present Illness: Brandon Rojas is a 75 y.o. male who presents for evaluation of CAD, s/p CABG in 2006.  He had atrial fib in Feb and was hospitalized.  He had TEE/DCCV.  His wife reports that he had really been getting short of breath for a couple of weeks prior to his cardioversion.  She had noticed some wheezing.  He was subsequently found to be in atrial fibrillation.  He was found to have acute diastolic heart failure treated with IV Lasix.  He was subsequently changed to torsemide.  Of note he was noted to have severe tricuspid regurgitation.  After going home and urinating quite a bit his wife actually has stopped his diuretic.  His breathing is better.  He has not noticed any of the wheezing that he had before.  There is no PND or orthopnea.  Has had no chest pressure, neck or arm discomfort.  He moves slowly with the help of a walker.  He has some mild chronic lower extremity swelling.  Past Medical History:  Diagnosis Date  . Anxiety state, unspecified   . Arthritis   . Benign neoplasm of colon   . CAD (coronary artery disease) of artery bypass graft 05/20/2018  . Depressive disorder, not elsewhere classified   . Diaphragmatic hernia without mention of obstruction or gangrene   . Diverticulosis of colon (without mention of hemorrhage)   . Esophageal reflux   . Esophageal stricture   . Hiatal hernia   . Mitral regurgitation   . Osteoarthrosis, unspecified whether generalized or localized, unspecified site   . Other and unspecified hyperlipidemia   . Parkinson's disease ([place])   . Personal history of colonic polyps   . Stroke ([place])   . Unspecified essential hypertension     Past Surgical History:  Procedure Laterality Date  .  CARDIOVERSION N/A 03/01/2020   Procedure: CARDIOVERSION;  Surgeon: Pixie Casino, MD;  Location: [facility] ENDOSCOPY;  Service: Cardiovascular;  Laterality: N/A;  . COLONOSCOPY    . CORONARY ARTERY BYPASS GRAFT     x 2  . INGUINAL HERNIA REPAIR     bilateral  . IR EXCHANGE BILIARY DRAIN  11/06/2018  . IR PERC CHOLECYSTOSTOMY  05/22/2018  . IR RADIOLOGIST EVAL & MGMT  06/24/2018  . IR RADIOLOGIST EVAL & MGMT  07/01/2018  . LUNG BIOPSY    . POLYPECTOMY    . PTCA    . TEE WITHOUT CARDIOVERSION N/A 03/01/2020   Procedure: TRANSESOPHAGEAL ECHOCARDIOGRAM (TEE);  Surgeon: Pixie Casino, MD;  Location: [facility] ENDOSCOPY;  Service: Cardiovascular;  Laterality: N/A;     Current Outpatient Medications  Medication Sig Dispense Refill  . albuterol (VENTOLIN HFA) 108 (90 Base) MCG/ACT inhaler Inhale 1-2 puffs into the lungs every 6 (six) hours as needed for wheezing or shortness of breath. 18 g 4  . apixaban (ELIQUIS) 5 MG TABS tablet Take 1 tablet (5 mg total) by mouth 2 (two) times daily. 60 tablet 2  . B Complex Vitamins (VITAMIN B COMPLEX PO) Take 1 tablet by mouth as needed.    . carbidopa-levodopa (SINEMET IR) 25-100 MG tablet TAKE 2 TABLETS AT 7 AM, 1 TABLET AT 11 AM, 2  TABLETS AT 3 PM, AND 1 TABLET AT 7 PM 540 tablet 0  . esomeprazole (NEXIUM) 40 MG capsule TAKE 1 CAPSULE BY MOUTH ONCE DAILY 90 capsule 3  . lovastatin (MEVACOR) 20 MG tablet TAKE 1 TABLET AT BEDTIME 90 tablet 1  . Multiple Vitamin (MULTIVITAMIN WITH MINERALS) TABS tablet Take 1 tablet by mouth daily.    . nitroGLYCERIN (NITROSTAT) 0.4 MG SL tablet PLACE 1 TABLET (0.4 MG TOTAL) UNDER THE TONGUE EVERY 5 (FIVE) MINUTES AS NEEDED. 50 tablet 3  . Polyethyl Glycol-Propyl Glycol (SYSTANE ULTRA) 0.4-0.3 % SOLN Apply to eye as directed.    [name] tiZANidine (ZANAFLEX) 4 MG tablet Take 1 tablet (4 mg total) by mouth every 6 (six) hours as needed for muscle spasms. 120 tablet 1  . traMADol (ULTRAM) 50 MG tablet TAKE 1 TABLET BY MOUTH EVERY 6 HOURS AS  NEEDED FOR PAIN 120 tablet 5   No current facility-administered medications for this visit.    Allergies:   Atorvastatin and Simvastatin    ROS:  Please see the history of present illness.   Otherwise, review of systems are positive for none.   All other systems are reviewed and negative.    PHYSICAL EXAM: VS:  BP 140/70   Pulse 62   Ht 5\' 4"  (1.626 m)   Wt 148 lb (67.1 kg)   BMI 25.40 kg/m  , BMI Body mass index is 25.4 kg/m. GENERAL:  Well appearing NECK:  No jugular venous distention, waveform within normal limits, carotid upstroke brisk and symmetric, no bruits, no thyromegaly LUNGS:  Clear to auscultation bilaterally CHEST:  Unremarkable HEART:  PMI not displaced or sustained,S1 and S2 within normal limits, no S3, no S4, no clicks, no rubs, 3 out of 6 holosystolic murmur heard at the third left intercostal space, no diastolic murmurs ABD:  Flat, positive bowel sounds normal in frequency in pitch, no bruits, no rebound, no guarding, no midline pulsatile mass, no hepatomegaly, no splenomegaly EXT:  2 plus pulses throughout, moderate bilateral lower extremity edema, no cyanosis no clubbing   EKG:  EKG is  ordered today. The ekg ordered today demonstrates sinus rhythm, rate 62, right axis deviation, no acute ST-T wave changes.  Right bundle branch block   Recent Labs: 02/26/2020: B Natriuretic Peptide 598.7; TSH 4.470 03/01/2020: ALT <5; Hemoglobin 12.1; Platelets 160 03/02/2020: BUN 26; Creatinine, Ser 1.42; Magnesium 1.9; Potassium 4.0; Sodium 135    Lipid Panel    Component Value Date/Time   CHOL 128 03/09/2019 1438   TRIG 101.0 03/09/2019 1438   HDL 52.50 03/09/2019 1438   CHOLHDL 2 03/09/2019 1438   VLDL 20.2 03/09/2019 1438   LDLCALC 56 03/09/2019 1438      Wt Readings from Last 3 Encounters:  04/05/20 148 lb (67.1 kg)  03/24/20 150 lb (68 kg)  03/03/20 152 lb 5.4 oz (69.1 kg)      Other studies Reviewed: Additional studies/ records that were reviewed  today include: Extensive review of hospital records. Review of the above records demonstrates:  Please see elsewhere in the note.     ASSESSMENT AND PLAN:   CAD: History of CABG in 2006, LIMA to LAD, SVG to PLA):   He had no evidence of acute coronary syndrome during his recent hospitalization.  No change in therapy.   Now that he is on NOAC again to discontinue his aspirin.  Chronic lower extremity edema: This is back to baseline.  We talked about as needed dosing of his diuretic  and keeping his feet elevated.  Diabetes: A1c was 6.2.  No change in therapy.   Atrial fib:   He is having no contraindication to the anticoagulation and I will continue his Eliquis.  We talked about how she might recognize and he might recognize if he is back in fibrillation.  TR: I will follow this clinically.  Acute diastolic heart failure: Except for some mild lower extremity swelling seems to be euvolemic.  No change in therapy.   Current medicines are reviewed at length with the patient today.  The patient does not have concerns regarding medicines.  The following changes have been made:  None  Labs/ tests ordered today include: None  Orders Placed This Encounter  Procedures  . EKG 12-Lead     Disposition:   FU with me 4 months.     Signed, Minus Breeding, MD  04/05/2020 5:32 PM    [facility]

## 2020-04-05 ENCOUNTER — Other Ambulatory Visit: Payer: Self-pay

## 2020-04-05 ENCOUNTER — Encounter: Payer: Self-pay | Admitting: Cardiology

## 2020-04-05 ENCOUNTER — Ambulatory Visit: Payer: Medicare HMO | Admitting: Cardiology

## 2020-04-05 DIAGNOSIS — M7989 Other specified soft tissue disorders: Secondary | ICD-10-CM

## 2020-04-05 DIAGNOSIS — I25700 Atherosclerosis of coronary artery bypass graft(s), unspecified, with unstable angina pectoris: Secondary | ICD-10-CM | POA: Diagnosis not present

## 2020-04-05 NOTE — Patient Instructions (Signed)
Medication Instructions:  STOP- Aspirin   *If you need a refill on your cardiac medications before your next appointment, please call your pharmacy*   Lab Work: None Ordered  Testing/Procedures: None ordered   Follow-Up: At [facility], you and your health needs are our priority.  As part of our continuing mission to provide you with exceptional heart care, we have created designated Provider Care Teams.  These Care Teams include your primary Cardiologist (physician) and Advanced Practice Providers (APPs -  Physician Assistants and Nurse Practitioners) who all work together to provide you with the care you need, when you need it.  We recommend signing up for the patient portal called "MyChart".  Sign up information is provided on this After Visit Summary.  MyChart is used to connect with patients for Virtual Visits (Telemedicine).  Patients are able to view lab/test results, encounter notes, upcoming appointments, etc.  Non-urgent messages can be sent to your provider as well.   To learn more about what you can do with MyChart, go to [email or web address].    Your next appointment:   2 month(s)  The format for your next appointment:   In Person  Provider:   Jory Sims, DNP

## 2020-04-06 ENCOUNTER — Other Ambulatory Visit: Payer: Self-pay

## 2020-04-06 DIAGNOSIS — I5033 Acute on chronic diastolic (congestive) heart failure: Secondary | ICD-10-CM | POA: Diagnosis not present

## 2020-04-06 DIAGNOSIS — I110 Hypertensive heart disease with heart failure: Secondary | ICD-10-CM | POA: Diagnosis not present

## 2020-04-06 DIAGNOSIS — N179 Acute kidney failure, unspecified: Secondary | ICD-10-CM | POA: Diagnosis not present

## 2020-04-06 DIAGNOSIS — J189 Pneumonia, unspecified organism: Secondary | ICD-10-CM | POA: Diagnosis not present

## 2020-04-06 DIAGNOSIS — I480 Paroxysmal atrial fibrillation: Secondary | ICD-10-CM | POA: Diagnosis not present

## 2020-04-06 DIAGNOSIS — I081 Rheumatic disorders of both mitral and tricuspid valves: Secondary | ICD-10-CM | POA: Diagnosis not present

## 2020-04-06 DIAGNOSIS — G20 Parkinson's disease: Secondary | ICD-10-CM | POA: Diagnosis not present

## 2020-04-06 DIAGNOSIS — F0281 Dementia in other diseases classified elsewhere with behavioral disturbance: Secondary | ICD-10-CM | POA: Diagnosis not present

## 2020-04-06 DIAGNOSIS — A4151 Sepsis due to Escherichia coli [E. coli]: Secondary | ICD-10-CM | POA: Diagnosis not present

## 2020-04-06 MED ORDER — ESOMEPRAZOLE MAGNESIUM 40 MG PO CPDR: 40.0000 mg | DELAYED_RELEASE_CAPSULE | Freq: Every day | ORAL | 3 refills | Status: DC

## 2020-04-07 DIAGNOSIS — I5033 Acute on chronic diastolic (congestive) heart failure: Secondary | ICD-10-CM | POA: Diagnosis not present

## 2020-04-07 DIAGNOSIS — J189 Pneumonia, unspecified organism: Secondary | ICD-10-CM | POA: Diagnosis not present

## 2020-04-07 DIAGNOSIS — A4151 Sepsis due to Escherichia coli [E. coli]: Secondary | ICD-10-CM | POA: Diagnosis not present

## 2020-04-07 DIAGNOSIS — I480 Paroxysmal atrial fibrillation: Secondary | ICD-10-CM | POA: Diagnosis not present

## 2020-04-07 DIAGNOSIS — N179 Acute kidney failure, unspecified: Secondary | ICD-10-CM | POA: Diagnosis not present

## 2020-04-07 DIAGNOSIS — F0281 Dementia in other diseases classified elsewhere with behavioral disturbance: Secondary | ICD-10-CM | POA: Diagnosis not present

## 2020-04-07 DIAGNOSIS — G20 Parkinson's disease: Secondary | ICD-10-CM | POA: Diagnosis not present

## 2020-04-07 DIAGNOSIS — I110 Hypertensive heart disease with heart failure: Secondary | ICD-10-CM | POA: Diagnosis not present

## 2020-04-07 DIAGNOSIS — I081 Rheumatic disorders of both mitral and tricuspid valves: Secondary | ICD-10-CM | POA: Diagnosis not present

## 2020-04-11 ENCOUNTER — Other Ambulatory Visit: Payer: Self-pay

## 2020-04-11 ENCOUNTER — Encounter: Payer: Self-pay | Admitting: Internal Medicine

## 2020-04-11 ENCOUNTER — Ambulatory Visit (INDEPENDENT_AMBULATORY_CARE_PROVIDER_SITE_OTHER): Payer: Medicare HMO | Admitting: Internal Medicine

## 2020-04-11 VITALS — BP 105/78 | HR 64 | Temp 97.9°F | Ht 64.0 in | Wt 146.0 lb

## 2020-04-11 DIAGNOSIS — E559 Vitamin D deficiency, unspecified: Secondary | ICD-10-CM

## 2020-04-11 DIAGNOSIS — Z7901 Long term (current) use of anticoagulants: Secondary | ICD-10-CM | POA: Diagnosis not present

## 2020-04-11 DIAGNOSIS — N1831 Chronic kidney disease, stage 3a: Secondary | ICD-10-CM

## 2020-04-11 DIAGNOSIS — I5031 Acute diastolic (congestive) heart failure: Secondary | ICD-10-CM | POA: Diagnosis not present

## 2020-04-11 DIAGNOSIS — N179 Acute kidney failure, unspecified: Secondary | ICD-10-CM

## 2020-04-11 DIAGNOSIS — Z0001 Encounter for general adult medical examination with abnormal findings: Secondary | ICD-10-CM

## 2020-04-11 DIAGNOSIS — I10 Essential (primary) hypertension: Secondary | ICD-10-CM

## 2020-04-11 DIAGNOSIS — I4891 Unspecified atrial fibrillation: Secondary | ICD-10-CM | POA: Diagnosis not present

## 2020-04-11 DIAGNOSIS — J984 Other disorders of lung: Secondary | ICD-10-CM | POA: Diagnosis not present

## 2020-04-11 DIAGNOSIS — E538 Deficiency of other specified B group vitamins: Secondary | ICD-10-CM

## 2020-04-11 DIAGNOSIS — I130 Hypertensive heart and chronic kidney disease with heart failure and stage 1 through stage 4 chronic kidney disease, or unspecified chronic kidney disease: Secondary | ICD-10-CM | POA: Diagnosis not present

## 2020-04-11 DIAGNOSIS — N189 Chronic kidney disease, unspecified: Secondary | ICD-10-CM | POA: Insufficient documentation

## 2020-04-11 DIAGNOSIS — E118 Type 2 diabetes mellitus with unspecified complications: Secondary | ICD-10-CM | POA: Diagnosis not present

## 2020-04-11 LAB — CBC WITH DIFFERENTIAL/PLATELET
Basophils Absolute: 0 10*3/uL (ref 0.0–0.1)
Basophils Relative: 0.8 % (ref 0.0–3.0)
Eosinophils Absolute: 0.4 10*3/uL (ref 0.0–0.7)
Eosinophils Relative: 6.6 % — ABNORMAL HIGH (ref 0.0–5.0)
HCT: 37.3 % — ABNORMAL LOW (ref 39.0–52.0)
Hemoglobin: 12 g/dL — ABNORMAL LOW (ref 13.0–17.0)
Lymphocytes Relative: 22.1 % (ref 12.0–46.0)
Lymphs Abs: 1.3 10*3/uL (ref 0.7–4.0)
MCHC: 32.1 g/dL (ref 30.0–36.0)
MCV: 84.9 fl (ref 78.0–100.0)
Monocytes Absolute: 0.5 10*3/uL (ref 0.1–1.0)
Monocytes Relative: 8.7 % (ref 3.0–12.0)
Neutro Abs: 3.8 10*3/uL (ref 1.4–7.7)
Neutrophils Relative %: 61.8 % (ref 43.0–77.0)
Platelets: 200 10*3/uL (ref 150.0–400.0)
RBC: 4.4 Mil/uL (ref 4.22–5.81)
RDW: 20 % — ABNORMAL HIGH (ref 11.5–15.5)
WBC: 6.1 10*3/uL (ref 4.0–10.5)

## 2020-04-11 LAB — URINALYSIS, ROUTINE W REFLEX MICROSCOPIC
Hgb urine dipstick: NEGATIVE
Ketones, ur: NEGATIVE
Nitrite: NEGATIVE
RBC / HPF: NONE SEEN (ref 0–?)
Specific Gravity, Urine: 1.01 (ref 1.000–1.030)
Total Protein, Urine: 30 — AB
Urine Glucose: NEGATIVE
Urobilinogen, UA: 0.2 (ref 0.0–1.0)
pH: 6 (ref 5.0–8.0)

## 2020-04-11 LAB — VITAMIN D 25 HYDROXY (VIT D DEFICIENCY, FRACTURES): VITD: 37.16 ng/mL (ref 30.00–100.00)

## 2020-04-11 LAB — HEMOGLOBIN A1C: Hgb A1c MFr Bld: 5.6 % (ref 4.6–6.5)

## 2020-04-11 LAB — LIPID PANEL
Cholesterol: 113 mg/dL (ref 0–200)
HDL: 51.1 mg/dL (ref >39.00)
LDL Cholesterol: 38 mg/dL (ref 0–99)
NonHDL: 61.91
Total CHOL/HDL Ratio: 2
Triglycerides: 118 mg/dL (ref 0.0–149.0)
VLDL: 23.6 mg/dL (ref 0.0–40.0)

## 2020-04-11 LAB — VITAMIN B12: Vitamin B-12: 650 pg/mL (ref 211–911)

## 2020-04-11 LAB — BASIC METABOLIC PANEL
BUN: 13 mg/dL (ref 6–23)
CO2: 31 mEq/L (ref 19–32)
Calcium: 9.4 mg/dL (ref 8.4–10.5)
Chloride: 105 mEq/L (ref 96–112)
Creatinine, Ser: 1.11 mg/dL (ref 0.40–1.50)
GFR: 64.78 mL/min (ref >60.00)
Glucose, Bld: 84 mg/dL (ref 70–99)
Potassium: 3.5 mEq/L (ref 3.5–5.1)
Sodium: 143 mEq/L (ref 135–145)

## 2020-04-11 LAB — HEPATIC FUNCTION PANEL
ALT: 4 U/L (ref 0–53)
AST: 19 U/L (ref 0–37)
Albumin: 3.5 g/dL (ref 3.5–5.2)
Alkaline Phosphatase: 110 U/L (ref 39–117)
Bilirubin, Direct: 0.1 mg/dL (ref 0.0–0.3)
Total Bilirubin: 0.5 mg/dL (ref 0.2–1.2)
Total Protein: 6.7 g/dL (ref 6.0–8.3)

## 2020-04-11 MED ORDER — APIXABAN 5 MG PO TABS: 1.0000 | ORAL_TABLET | Freq: Two times a day (BID) | ORAL | 3 refills | Status: DC

## 2020-04-11 MED ORDER — ALBUTEROL SULFATE HFA 108 (90 BASE) MCG/ACT IN AERS: 1.0000 | INHALATION_SPRAY | Freq: Four times a day (QID) | RESPIRATORY_TRACT | 4 refills | Status: AC | PRN

## 2020-04-11 NOTE — Patient Instructions (Signed)
Please continue all other medications as before, and refills have been done if requested - eliquis and albuterol  Please have the pharmacy call with any other refills you may need.  Please continue your efforts at being more active, low cholesterol diet, and weight control.  You are otherwise up to date with prevention measures today.  Please keep your appointments with your specialists as you may have planned  Please go to the LAB at the blood drawing area for the tests to be done  You will be contacted by phone if any changes need to be made immediately.  Otherwise, you will receive a letter about your results with an explanation, but please check with MyChart first.  Please remember to sign up for MyChart if you have not done so, as this will be important to you in the future with finding out test results, communicating by private email, and scheduling acute appointments online when needed.  Please make an Appointment to return in 4 months

## 2020-04-11 NOTE — Progress Notes (Signed)
Patient ID: DEL OVERFELT, male   DOB: 10-10-1945, 75 y.o.   MRN: [id number]         Chief Complaint:: wellness exam and Follow-up  restictive lung disease, AKI with e coli uti and sepsis, chronic anticoagulation, afib, diastolic chf       HPI:  Brandon Rojas is a 75 y.o. male here for wellness exam; declines colonoscopy o/w up to date with preventive referrals and immunizations.                         Also Pt denies chest pain, increased sob or doe, wheezing, orthopnea, PND, increased LE swelling, palpitations, dizziness or syncope, though has baseline dyspnea related to restrictive lung dissease, request inhaler refill, follows with pulmonary. . No overt bleeding on eliquis.  Denies urinary symptoms such as dysuria, frequency, urgency, flank pain, hematuria or n/v, fever, chills.  Due for f/u renal fxn after episode AKI with e coli UTI sepsis.  Denies other focal neuro s/s.   Pt denies polydipsia, polyuria,  Pt denies fever, night sweats, loss of appetite, or other constitutional symptoms, except for recent wt loss he believes associated with recent acute illness.  Denies worsening depressive symptoms, suicidal ideation, or panic; wife conts to be great support.  No other new complaints  Despite parkinsons he and wife endorse no recent falls.   Wt Readings from Last 3 Encounters:  04/11/20 146 lb (66.2 kg)  04/05/20 148 lb (67.1 kg)  03/24/20 150 lb (68 kg)   BP Readings from Last 3 Encounters:  04/11/20 105/78  04/05/20 140/70  03/03/20 136/69   Immunization History  Administered Date(s) Administered  . Fluad Quad(high Dose 65+) 09/04/2018  . Influenza, High Dose Seasonal PF 12/09/2012, 01/19/2016, 11/12/2017  . Influenza,inj,Quad PF,6+ Mos 01/15/2014, 10/21/2014  . Pneumococcal Conjugate-13 12/23/2012  . Pneumococcal Polysaccharide-23 12/08/2009  . Td 01/04/2009  . Tdap 09/10/2019   There are no preventive care reminders to display for this patient.    Past Medical History:   Diagnosis Date  . Anxiety state, unspecified   . Arthritis   . Benign neoplasm of colon   . CAD (coronary artery disease) of artery bypass graft 05/20/2018  . Depressive disorder, not elsewhere classified   . Diaphragmatic hernia without mention of obstruction or gangrene   . Diverticulosis of colon (without mention of hemorrhage)   . Esophageal reflux   . Esophageal stricture   . Hiatal hernia   . Mitral regurgitation   . Osteoarthrosis, unspecified whether generalized or localized, unspecified site   . Other and unspecified hyperlipidemia   . Parkinson's disease ([place])   . Personal history of colonic polyps   . Stroke ([place])   . Unspecified essential hypertension    Past Surgical History:  Procedure Laterality Date  . CARDIOVERSION N/A 03/01/2020   Procedure: CARDIOVERSION;  Surgeon: Pixie Casino, MD;  Location: [facility] ENDOSCOPY;  Service: Cardiovascular;  Laterality: N/A;  . COLONOSCOPY    . CORONARY ARTERY BYPASS GRAFT     x 2  . INGUINAL HERNIA REPAIR     bilateral  . IR EXCHANGE BILIARY DRAIN  11/06/2018  . IR PERC CHOLECYSTOSTOMY  05/22/2018  . IR RADIOLOGIST EVAL & MGMT  06/24/2018  . IR RADIOLOGIST EVAL & MGMT  07/01/2018  . LUNG BIOPSY    . POLYPECTOMY    . PTCA    . TEE WITHOUT CARDIOVERSION N/A 03/01/2020   Procedure: TRANSESOPHAGEAL ECHOCARDIOGRAM (TEE);  Surgeon: Debara Pickett,  Nadean Corwin, MD;  Location: [facility] ENDOSCOPY;  Service: Cardiovascular;  Laterality: N/A;    reports that he quit smoking about 48 years ago. He has never used smokeless tobacco. He reports that he does not drink alcohol and does not use drugs. family history includes Colon cancer in his sister; Fibromyalgia in his daughter; Healthy in his brother, brother, brother, daughter, and sister; Heart attack in his mother; Heart disease in his brother; Multiple sclerosis in his son; Stroke in his father. Allergies  Allergen Reactions  . Atorvastatin Other (See Comments)    Muscle cramps  . Simvastatin Other (See  Comments)    Muscle cramps  . Statins Hives    pts wife states its two statin medications he is allergic to   Current Outpatient Medications on File Prior to Visit  Medication Sig Dispense Refill  . apixaban (ELIQUIS) 5 MG TABS tablet Take 1 tablet (5 mg total) by mouth 2 (two) times daily. 60 tablet 2  . B Complex Vitamins (VITAMIN B COMPLEX PO) Take 1 tablet by mouth as needed.    . carbidopa-levodopa (SINEMET IR) 25-100 MG tablet TAKE 2 TABLETS AT 7 AM, 1 TABLET AT 11 AM, 2 TABLETS AT 3 PM, AND 1 TABLET AT 7 PM 540 tablet 0  . esomeprazole (NEXIUM) 40 MG capsule Take 1 capsule (40 mg total) by mouth daily. 90 capsule 3  . hydrALAZINE (APRESOLINE) 25 MG tablet TAKE 1 TABLET (25 MG TOTAL) BY MOUTH EVERY EIGHT HOURS. 90 tablet 2  . lovastatin (MEVACOR) 20 MG tablet TAKE 1 TABLET AT BEDTIME 90 tablet 1  . Multiple Vitamin (MULTIVITAMIN WITH MINERALS) TABS tablet Take 1 tablet by mouth daily.    . nitroGLYCERIN (NITROSTAT) 0.4 MG SL tablet PLACE 1 TABLET (0.4 MG TOTAL) UNDER THE TONGUE EVERY 5 (FIVE) MINUTES AS NEEDED. 50 tablet 3  . nystatin (MYCOSTATIN) 100000 UNIT/ML suspension TAKE 5 MLS (500,000 UNITS TOTAL) BY MOUTH FOUR TIMES DAILY. 120 mL 0  . Polyethyl Glycol-Propyl Glycol (SYSTANE ULTRA) 0.4-0.3 % SOLN Apply to eye as directed.    . polyethylene glycol powder (GLYCOLAX/MIRALAX) 17 GM/SCOOP powder DISSOLVE 1 CAPFUL (17 G) IN WATER AND DRINK DAILY AS NEEDED FOR MILD CONSTIPATION. 238 g 0  . tiZANidine (ZANAFLEX) 4 MG tablet Take 1 tablet (4 mg total) by mouth every 6 (six) hours as needed for muscle spasms. 120 tablet 1  . torsemide (DEMADEX) 20 MG tablet TAKE 1 TABLET (20 MG TOTAL) BY MOUTH TWO TIMES DAILY. 60 tablet 2  . traMADol (ULTRAM) 50 MG tablet TAKE 1 TABLET BY MOUTH EVERY 6 HOURS AS NEEDED FOR PAIN 120 tablet 5   No current facility-administered medications on file prior to visit.        ROS:  All others reviewed and negative.  Objective        PE:  BP 105/78 (BP Location:  Left Arm, Patient Position: Sitting, Cuff Size: Large)   Pulse 64   Temp 97.9 F (36.6 C) (Oral)   Ht 5\' 4"  (1.626 m)   Wt 146 lb (66.2 kg)   SpO2 96%   BMI 25.06 kg/m                 Constitutional: Pt appears in NAD               HENT: Head: NCAT.                Right Ear: External ear normal.  Left Ear: External ear normal.                Eyes: . Pupils are equal, round, and reactive to light. Conjunctivae and EOM are normal               Nose: without d/c or deformity               Neck: Neck supple. Gross normal ROM               Cardiovascular: Normal rate and regular rhythm.                 Pulmonary/Chest: Effort normal and breath sounds without rales or wheezing.                Abd:  Soft, NT, ND, + BS, no organomegaly               Neurological: Pt is alert. At baseline orientation, motor grossly intact               Skin: Skin is warm. No rashes, no other new lesions, LE edema - trace bilat only               Psychiatric: Pt behavior is normal without agitation   Micro: none  Cardiac tracings I have personally interpreted today:  none  Pertinent Radiological findings (summarize): none   Lab Results  Component Value Date   WBC 6.1 04/11/2020   HGB 12.0 (L) 04/11/2020   HCT 37.3 (L) 04/11/2020   PLT 200.0 04/11/2020   GLUCOSE 84 04/11/2020   CHOL 113 04/11/2020   TRIG 118.0 04/11/2020   HDL 51.10 04/11/2020   LDLCALC 38 04/11/2020   ALT 4 04/11/2020   AST 19 04/11/2020   NA 143 04/11/2020   K 3.5 04/11/2020   CL 105 04/11/2020   CREATININE 1.11 04/11/2020   BUN 13 04/11/2020   CO2 31 04/11/2020   TSH 4.470 02/26/2020   PSA 1.23 09/04/2018   INR 2.2 (H) 03/01/2020   HGBA1C 5.6 04/11/2020   Assessment/Plan:  Brandon Rojas is a 75 y.o. White or Caucasian [1] male with  has a past medical history of Anxiety state, unspecified, Arthritis, Benign neoplasm of colon, CAD (coronary artery disease) of artery bypass graft (05/20/2018), Depressive  disorder, not elsewhere classified, Diaphragmatic hernia without mention of obstruction or gangrene, Diverticulosis of colon (without mention of hemorrhage), Esophageal reflux, Esophageal stricture, Hiatal hernia, Mitral regurgitation, Osteoarthrosis, unspecified whether generalized or localized, unspecified site, Other and unspecified hyperlipidemia, Parkinson's disease ([place]), Personal history of colonic polyps, Stroke ([place]), and Unspecified essential hypertension.  Encounter for well adult exam with abnormal findings Age and sex appropriate education and counseling updated with regular exercise and diet Referrals for preventative services - declines colonoscopy Immunizations addressed - none needed Smoking counseling  - none needed Evidence for depression or other mood disorder - none significant Most recent labs reviewed. I have personally reviewed and have noted: 1) the patient's medical and social history 2) The patient's current medications and supplements 3) The patient's height, weight, and BMI have been recorded in the chart   Chronic anticoagulation To continue eliquis asd  Atrial fibrillation, new onset (HCC) Stable overall volume and rate, cont current med tx   Acute renal failure superimposed on chronic kidney disease ([place]) For f/u lab, today  Acute diastolic CHF (congestive heart failure) (HCC) Stable volume, cont current med tx - demedex   Current Outpatient Medications (Cardiovascular):  .  hydrALAZINE (APRESOLINE) 25 MG tablet, TAKE 1 TABLET (25 MG TOTAL) BY MOUTH EVERY EIGHT HOURS. [name]  lovastatin (MEVACOR) 20 MG tablet, TAKE 1 TABLET AT BEDTIME .  nitroGLYCERIN (NITROSTAT) 0.4 MG SL tablet, PLACE 1 TABLET (0.4 MG TOTAL) UNDER THE TONGUE EVERY 5 (FIVE) MINUTES AS NEEDED. [name]  torsemide (DEMADEX) 20 MG tablet, TAKE 1 TABLET (20 MG TOTAL) BY MOUTH TWO TIMES DAILY.  Current Outpatient Medications (Respiratory):  .  albuterol (VENTOLIN HFA) 108 (90 Base) MCG/ACT inhaler,  Inhale 1-2 puffs into the lungs every 6 (six) hours as needed for wheezing or shortness of breath.  Current Outpatient Medications (Analgesics):  .  traMADol (ULTRAM) 50 MG tablet, TAKE 1 TABLET BY MOUTH EVERY 6 HOURS AS NEEDED FOR PAIN  Current Outpatient Medications (Hematological):  .  apixaban (ELIQUIS) 5 MG TABS tablet, Take 1 tablet (5 mg total) by mouth 2 (two) times daily. [name]  apixaban (ELIQUIS) 5 MG TABS tablet, TAKE 1 TABLET (5 MG TOTAL) BY MOUTH TWO TIMES DAILY.  Current Outpatient Medications (Other):  [name]  B Complex Vitamins (VITAMIN B COMPLEX PO), Take 1 tablet by mouth as needed. .  carbidopa-levodopa (SINEMET IR) 25-100 MG tablet, TAKE 2 TABLETS AT 7 AM, 1 TABLET AT 11 AM, 2 TABLETS AT 3 PM, AND 1 TABLET AT 7 PM .  esomeprazole (NEXIUM) 40 MG capsule, Take 1 capsule (40 mg total) by mouth daily. .  Multiple Vitamin (MULTIVITAMIN WITH MINERALS) TABS tablet, Take 1 tablet by mouth daily. [name]  nystatin (MYCOSTATIN) 100000 UNIT/ML suspension, TAKE 5 MLS (500,000 UNITS TOTAL) BY MOUTH FOUR TIMES DAILY. Vladimir Faster Glycol-Propyl Glycol (SYSTANE ULTRA) 0.4-0.3 % SOLN, Apply to eye as directed. .  polyethylene glycol powder (GLYCOLAX/MIRALAX) 17 GM/SCOOP powder, DISSOLVE 1 CAPFUL (17 G) IN WATER AND DRINK DAILY AS NEEDED FOR MILD CONSTIPATION. [name]  tiZANidine (ZANAFLEX) 4 MG tablet, Take 1 tablet (4 mg total) by mouth every 6 (six) hours as needed for muscle spasms.   Type 2 diabetes mellitus with complication, without long-term current use of insulin (HCC) Lab Results  Component Value Date   HGBA1C 5.6 04/11/2020   Stable, pt to continue current medical treatment  - diet and wt control   Restrictive lung disease Stable, to continue inhaler prn  Essential hypertension BP Readings from Last 3 Encounters:  04/11/20 105/78  04/05/20 140/70  03/03/20 136/69   Stable, pt to continue medical treatment  - hydralazine   Followup: Return in about 4 months (around 08/11/2020).  [name], MD 04/16/2020 9:18 PM [facility]

## 2020-04-12 DIAGNOSIS — I480 Paroxysmal atrial fibrillation: Secondary | ICD-10-CM | POA: Diagnosis not present

## 2020-04-12 DIAGNOSIS — F0281 Dementia in other diseases classified elsewhere with behavioral disturbance: Secondary | ICD-10-CM | POA: Diagnosis not present

## 2020-04-12 DIAGNOSIS — A4151 Sepsis due to Escherichia coli [E. coli]: Secondary | ICD-10-CM | POA: Diagnosis not present

## 2020-04-12 DIAGNOSIS — I081 Rheumatic disorders of both mitral and tricuspid valves: Secondary | ICD-10-CM | POA: Diagnosis not present

## 2020-04-12 DIAGNOSIS — G20 Parkinson's disease: Secondary | ICD-10-CM | POA: Diagnosis not present

## 2020-04-12 DIAGNOSIS — J189 Pneumonia, unspecified organism: Secondary | ICD-10-CM | POA: Diagnosis not present

## 2020-04-12 DIAGNOSIS — I5033 Acute on chronic diastolic (congestive) heart failure: Secondary | ICD-10-CM | POA: Diagnosis not present

## 2020-04-12 DIAGNOSIS — N179 Acute kidney failure, unspecified: Secondary | ICD-10-CM | POA: Diagnosis not present

## 2020-04-12 DIAGNOSIS — I110 Hypertensive heart disease with heart failure: Secondary | ICD-10-CM | POA: Diagnosis not present

## 2020-04-13 DIAGNOSIS — A4151 Sepsis due to Escherichia coli [E. coli]: Secondary | ICD-10-CM | POA: Diagnosis not present

## 2020-04-13 DIAGNOSIS — J189 Pneumonia, unspecified organism: Secondary | ICD-10-CM | POA: Diagnosis not present

## 2020-04-13 DIAGNOSIS — G20 Parkinson's disease: Secondary | ICD-10-CM | POA: Diagnosis not present

## 2020-04-13 DIAGNOSIS — I110 Hypertensive heart disease with heart failure: Secondary | ICD-10-CM | POA: Diagnosis not present

## 2020-04-13 DIAGNOSIS — I081 Rheumatic disorders of both mitral and tricuspid valves: Secondary | ICD-10-CM | POA: Diagnosis not present

## 2020-04-13 DIAGNOSIS — I5033 Acute on chronic diastolic (congestive) heart failure: Secondary | ICD-10-CM | POA: Diagnosis not present

## 2020-04-13 DIAGNOSIS — N179 Acute kidney failure, unspecified: Secondary | ICD-10-CM | POA: Diagnosis not present

## 2020-04-13 DIAGNOSIS — F0281 Dementia in other diseases classified elsewhere with behavioral disturbance: Secondary | ICD-10-CM | POA: Diagnosis not present

## 2020-04-13 DIAGNOSIS — I480 Paroxysmal atrial fibrillation: Secondary | ICD-10-CM | POA: Diagnosis not present

## 2020-04-16 ENCOUNTER — Encounter: Payer: Self-pay | Admitting: Internal Medicine

## 2020-04-16 NOTE — Assessment & Plan Note (Signed)
BP Readings from Last 3 Encounters:  04/11/20 105/78  04/05/20 140/70  03/03/20 136/69   Stable, pt to continue medical treatment  - hydralazine

## 2020-04-16 NOTE — Assessment & Plan Note (Signed)
To continue eliquis asd

## 2020-04-16 NOTE — Assessment & Plan Note (Signed)
Stable volume, cont current med tx - demedex   Current Outpatient Medications (Cardiovascular):  .  hydrALAZINE (APRESOLINE) 25 MG tablet, TAKE 1 TABLET (25 MG TOTAL) BY MOUTH EVERY EIGHT HOURS. [name]  lovastatin (MEVACOR) 20 MG tablet, TAKE 1 TABLET AT BEDTIME .  nitroGLYCERIN (NITROSTAT) 0.4 MG SL tablet, PLACE 1 TABLET (0.4 MG TOTAL) UNDER THE TONGUE EVERY 5 (FIVE) MINUTES AS NEEDED. [name]  torsemide (DEMADEX) 20 MG tablet, TAKE 1 TABLET (20 MG TOTAL) BY MOUTH TWO TIMES DAILY.  Current Outpatient Medications (Respiratory):  .  albuterol (VENTOLIN HFA) 108 (90 Base) MCG/ACT inhaler, Inhale 1-2 puffs into the lungs every 6 (six) hours as needed for wheezing or shortness of breath.  Current Outpatient Medications (Analgesics):  .  traMADol (ULTRAM) 50 MG tablet, TAKE 1 TABLET BY MOUTH EVERY 6 HOURS AS NEEDED FOR PAIN  Current Outpatient Medications (Hematological):  .  apixaban (ELIQUIS) 5 MG TABS tablet, Take 1 tablet (5 mg total) by mouth 2 (two) times daily. [name]  apixaban (ELIQUIS) 5 MG TABS tablet, TAKE 1 TABLET (5 MG TOTAL) BY MOUTH TWO TIMES DAILY.  Current Outpatient Medications (Other):  [name]  B Complex Vitamins (VITAMIN B COMPLEX PO), Take 1 tablet by mouth as needed. .  carbidopa-levodopa (SINEMET IR) 25-100 MG tablet, TAKE 2 TABLETS AT 7 AM, 1 TABLET AT 11 AM, 2 TABLETS AT 3 PM, AND 1 TABLET AT 7 PM .  esomeprazole (NEXIUM) 40 MG capsule, Take 1 capsule (40 mg total) by mouth daily. .  Multiple Vitamin (MULTIVITAMIN WITH MINERALS) TABS tablet, Take 1 tablet by mouth daily. [name]  nystatin (MYCOSTATIN) 100000 UNIT/ML suspension, TAKE 5 MLS (500,000 UNITS TOTAL) BY MOUTH FOUR TIMES DAILY. Vladimir Faster Glycol-Propyl Glycol (SYSTANE ULTRA) 0.4-0.3 % SOLN, Apply to eye as directed. .  polyethylene glycol powder (GLYCOLAX/MIRALAX) 17 GM/SCOOP powder, DISSOLVE 1 CAPFUL (17 G) IN WATER AND DRINK DAILY AS NEEDED FOR MILD CONSTIPATION. [name]  tiZANidine (ZANAFLEX) 4 MG tablet, Take 1 tablet (4 mg total) by  mouth every 6 (six) hours as needed for muscle spasms.

## 2020-04-16 NOTE — Assessment & Plan Note (Signed)
Age and sex appropriate education and counseling updated with regular exercise and diet Referrals for preventative services - declines colonoscopy Immunizations addressed - none needed Smoking counseling  - none needed Evidence for depression or other mood disorder - none significant Most recent labs reviewed. I have personally reviewed and have noted: 1) the patient's medical and social history 2) The patient's current medications and supplements 3) The patient's height, weight, and BMI have been recorded in the chart

## 2020-04-16 NOTE — Assessment & Plan Note (Signed)
For f/u lab, today

## 2020-04-16 NOTE — Assessment & Plan Note (Signed)
Lab Results  Component Value Date   HGBA1C 5.6 04/11/2020   Stable, pt to continue current medical treatment  - diet and wt control

## 2020-04-16 NOTE — Assessment & Plan Note (Signed)
Stable, to continue inhaler prn

## 2020-04-16 NOTE — Assessment & Plan Note (Signed)
Stable overall volume and rate, cont current med tx

## 2020-04-19 DIAGNOSIS — I480 Paroxysmal atrial fibrillation: Secondary | ICD-10-CM | POA: Diagnosis not present

## 2020-04-19 DIAGNOSIS — I081 Rheumatic disorders of both mitral and tricuspid valves: Secondary | ICD-10-CM | POA: Diagnosis not present

## 2020-04-19 DIAGNOSIS — F0281 Dementia in other diseases classified elsewhere with behavioral disturbance: Secondary | ICD-10-CM | POA: Diagnosis not present

## 2020-04-19 DIAGNOSIS — H43392 Other vitreous opacities, left eye: Secondary | ICD-10-CM | POA: Diagnosis not present

## 2020-04-19 DIAGNOSIS — H59033 Cystoid macular edema following cataract surgery, bilateral: Secondary | ICD-10-CM | POA: Diagnosis not present

## 2020-04-19 DIAGNOSIS — I5033 Acute on chronic diastolic (congestive) heart failure: Secondary | ICD-10-CM | POA: Diagnosis not present

## 2020-04-19 DIAGNOSIS — N179 Acute kidney failure, unspecified: Secondary | ICD-10-CM | POA: Diagnosis not present

## 2020-04-19 DIAGNOSIS — H26493 Other secondary cataract, bilateral: Secondary | ICD-10-CM | POA: Diagnosis not present

## 2020-04-19 DIAGNOSIS — H18413 Arcus senilis, bilateral: Secondary | ICD-10-CM | POA: Diagnosis not present

## 2020-04-19 DIAGNOSIS — J189 Pneumonia, unspecified organism: Secondary | ICD-10-CM | POA: Diagnosis not present

## 2020-04-19 DIAGNOSIS — A4151 Sepsis due to Escherichia coli [E. coli]: Secondary | ICD-10-CM | POA: Diagnosis not present

## 2020-04-19 DIAGNOSIS — H01009 Unspecified blepharitis unspecified eye, unspecified eyelid: Secondary | ICD-10-CM | POA: Diagnosis not present

## 2020-04-19 DIAGNOSIS — H26492 Other secondary cataract, left eye: Secondary | ICD-10-CM | POA: Diagnosis not present

## 2020-04-19 DIAGNOSIS — Z961 Presence of intraocular lens: Secondary | ICD-10-CM | POA: Diagnosis not present

## 2020-04-19 DIAGNOSIS — G20 Parkinson's disease: Secondary | ICD-10-CM | POA: Diagnosis not present

## 2020-04-19 DIAGNOSIS — H40033 Anatomical narrow angle, bilateral: Secondary | ICD-10-CM | POA: Diagnosis not present

## 2020-04-19 DIAGNOSIS — I110 Hypertensive heart disease with heart failure: Secondary | ICD-10-CM | POA: Diagnosis not present

## 2020-04-21 DIAGNOSIS — I081 Rheumatic disorders of both mitral and tricuspid valves: Secondary | ICD-10-CM | POA: Diagnosis not present

## 2020-04-21 DIAGNOSIS — N179 Acute kidney failure, unspecified: Secondary | ICD-10-CM | POA: Diagnosis not present

## 2020-04-21 DIAGNOSIS — G20 Parkinson's disease: Secondary | ICD-10-CM | POA: Diagnosis not present

## 2020-04-21 DIAGNOSIS — I480 Paroxysmal atrial fibrillation: Secondary | ICD-10-CM | POA: Diagnosis not present

## 2020-04-21 DIAGNOSIS — I5033 Acute on chronic diastolic (congestive) heart failure: Secondary | ICD-10-CM | POA: Diagnosis not present

## 2020-04-21 DIAGNOSIS — F0281 Dementia in other diseases classified elsewhere with behavioral disturbance: Secondary | ICD-10-CM | POA: Diagnosis not present

## 2020-04-21 DIAGNOSIS — I110 Hypertensive heart disease with heart failure: Secondary | ICD-10-CM | POA: Diagnosis not present

## 2020-04-21 DIAGNOSIS — A4151 Sepsis due to Escherichia coli [E. coli]: Secondary | ICD-10-CM | POA: Diagnosis not present

## 2020-04-21 DIAGNOSIS — J189 Pneumonia, unspecified organism: Secondary | ICD-10-CM | POA: Diagnosis not present

## 2020-04-27 DIAGNOSIS — G20 Parkinson's disease: Secondary | ICD-10-CM | POA: Diagnosis not present

## 2020-04-27 DIAGNOSIS — N179 Acute kidney failure, unspecified: Secondary | ICD-10-CM | POA: Diagnosis not present

## 2020-04-27 DIAGNOSIS — I5033 Acute on chronic diastolic (congestive) heart failure: Secondary | ICD-10-CM | POA: Diagnosis not present

## 2020-04-27 DIAGNOSIS — F0281 Dementia in other diseases classified elsewhere with behavioral disturbance: Secondary | ICD-10-CM | POA: Diagnosis not present

## 2020-04-27 DIAGNOSIS — A4151 Sepsis due to Escherichia coli [E. coli]: Secondary | ICD-10-CM | POA: Diagnosis not present

## 2020-04-27 DIAGNOSIS — I480 Paroxysmal atrial fibrillation: Secondary | ICD-10-CM | POA: Diagnosis not present

## 2020-04-27 DIAGNOSIS — I081 Rheumatic disorders of both mitral and tricuspid valves: Secondary | ICD-10-CM | POA: Diagnosis not present

## 2020-04-27 DIAGNOSIS — J189 Pneumonia, unspecified organism: Secondary | ICD-10-CM | POA: Diagnosis not present

## 2020-04-27 DIAGNOSIS — I110 Hypertensive heart disease with heart failure: Secondary | ICD-10-CM | POA: Diagnosis not present

## 2020-04-28 DIAGNOSIS — J189 Pneumonia, unspecified organism: Secondary | ICD-10-CM | POA: Diagnosis not present

## 2020-04-28 DIAGNOSIS — G20 Parkinson's disease: Secondary | ICD-10-CM | POA: Diagnosis not present

## 2020-04-28 DIAGNOSIS — F0281 Dementia in other diseases classified elsewhere with behavioral disturbance: Secondary | ICD-10-CM | POA: Diagnosis not present

## 2020-04-28 DIAGNOSIS — A4151 Sepsis due to Escherichia coli [E. coli]: Secondary | ICD-10-CM | POA: Diagnosis not present

## 2020-04-28 DIAGNOSIS — N179 Acute kidney failure, unspecified: Secondary | ICD-10-CM | POA: Diagnosis not present

## 2020-04-28 DIAGNOSIS — I5033 Acute on chronic diastolic (congestive) heart failure: Secondary | ICD-10-CM | POA: Diagnosis not present

## 2020-04-28 DIAGNOSIS — I081 Rheumatic disorders of both mitral and tricuspid valves: Secondary | ICD-10-CM | POA: Diagnosis not present

## 2020-04-28 DIAGNOSIS — I110 Hypertensive heart disease with heart failure: Secondary | ICD-10-CM | POA: Diagnosis not present

## 2020-04-28 DIAGNOSIS — I480 Paroxysmal atrial fibrillation: Secondary | ICD-10-CM | POA: Diagnosis not present

## 2020-05-02 DIAGNOSIS — F0281 Dementia in other diseases classified elsewhere with behavioral disturbance: Secondary | ICD-10-CM | POA: Diagnosis not present

## 2020-05-02 DIAGNOSIS — I480 Paroxysmal atrial fibrillation: Secondary | ICD-10-CM | POA: Diagnosis not present

## 2020-05-02 DIAGNOSIS — N179 Acute kidney failure, unspecified: Secondary | ICD-10-CM | POA: Diagnosis not present

## 2020-05-02 DIAGNOSIS — I5033 Acute on chronic diastolic (congestive) heart failure: Secondary | ICD-10-CM | POA: Diagnosis not present

## 2020-05-02 DIAGNOSIS — A4151 Sepsis due to Escherichia coli [E. coli]: Secondary | ICD-10-CM | POA: Diagnosis not present

## 2020-05-02 DIAGNOSIS — J189 Pneumonia, unspecified organism: Secondary | ICD-10-CM | POA: Diagnosis not present

## 2020-05-02 DIAGNOSIS — G20 Parkinson's disease: Secondary | ICD-10-CM | POA: Diagnosis not present

## 2020-05-02 DIAGNOSIS — I081 Rheumatic disorders of both mitral and tricuspid valves: Secondary | ICD-10-CM | POA: Diagnosis not present

## 2020-05-02 DIAGNOSIS — I110 Hypertensive heart disease with heart failure: Secondary | ICD-10-CM | POA: Diagnosis not present

## 2020-05-04 DIAGNOSIS — G20 Parkinson's disease: Secondary | ICD-10-CM | POA: Diagnosis not present

## 2020-05-04 DIAGNOSIS — I5033 Acute on chronic diastolic (congestive) heart failure: Secondary | ICD-10-CM | POA: Diagnosis not present

## 2020-05-04 DIAGNOSIS — J189 Pneumonia, unspecified organism: Secondary | ICD-10-CM | POA: Diagnosis not present

## 2020-05-04 DIAGNOSIS — F0281 Dementia in other diseases classified elsewhere with behavioral disturbance: Secondary | ICD-10-CM | POA: Diagnosis not present

## 2020-05-04 DIAGNOSIS — I081 Rheumatic disorders of both mitral and tricuspid valves: Secondary | ICD-10-CM | POA: Diagnosis not present

## 2020-05-04 DIAGNOSIS — I480 Paroxysmal atrial fibrillation: Secondary | ICD-10-CM | POA: Diagnosis not present

## 2020-05-04 DIAGNOSIS — A4151 Sepsis due to Escherichia coli [E. coli]: Secondary | ICD-10-CM | POA: Diagnosis not present

## 2020-05-04 DIAGNOSIS — N179 Acute kidney failure, unspecified: Secondary | ICD-10-CM | POA: Diagnosis not present

## 2020-05-04 DIAGNOSIS — I110 Hypertensive heart disease with heart failure: Secondary | ICD-10-CM | POA: Diagnosis not present

## 2020-05-05 DIAGNOSIS — H26491 Other secondary cataract, right eye: Secondary | ICD-10-CM | POA: Diagnosis not present

## 2020-05-16 DIAGNOSIS — H524 Presbyopia: Secondary | ICD-10-CM | POA: Diagnosis not present

## 2020-06-04 DIAGNOSIS — K4090 Unilateral inguinal hernia, without obstruction or gangrene, not specified as recurrent: Secondary | ICD-10-CM | POA: Diagnosis not present

## 2020-06-04 DIAGNOSIS — N179 Acute kidney failure, unspecified: Secondary | ICD-10-CM | POA: Diagnosis not present

## 2020-06-04 DIAGNOSIS — Z7901 Long term (current) use of anticoagulants: Secondary | ICD-10-CM | POA: Diagnosis not present

## 2020-06-04 DIAGNOSIS — K4030 Unilateral inguinal hernia, with obstruction, without gangrene, not specified as recurrent: Secondary | ICD-10-CM | POA: Insufficient documentation

## 2020-06-04 DIAGNOSIS — K55021 Focal (segmental) acute infarction of small intestine: Secondary | ICD-10-CM | POA: Diagnosis not present

## 2020-06-04 DIAGNOSIS — K4041 Unilateral inguinal hernia, with gangrene, recurrent: Secondary | ICD-10-CM | POA: Diagnosis not present

## 2020-06-04 DIAGNOSIS — K5669 Other partial intestinal obstruction: Secondary | ICD-10-CM | POA: Diagnosis not present

## 2020-06-04 DIAGNOSIS — K449 Diaphragmatic hernia without obstruction or gangrene: Secondary | ICD-10-CM | POA: Diagnosis not present

## 2020-06-04 DIAGNOSIS — I517 Cardiomegaly: Secondary | ICD-10-CM | POA: Diagnosis not present

## 2020-06-04 DIAGNOSIS — K6389 Other specified diseases of intestine: Secondary | ICD-10-CM | POA: Diagnosis not present

## 2020-06-04 DIAGNOSIS — K56699 Other intestinal obstruction unspecified as to partial versus complete obstruction: Secondary | ICD-10-CM | POA: Diagnosis not present

## 2020-06-04 DIAGNOSIS — K56609 Unspecified intestinal obstruction, unspecified as to partial versus complete obstruction: Secondary | ICD-10-CM | POA: Diagnosis not present

## 2020-06-04 DIAGNOSIS — E1152 Type 2 diabetes mellitus with diabetic peripheral angiopathy with gangrene: Secondary | ICD-10-CM | POA: Diagnosis not present

## 2020-06-04 DIAGNOSIS — K551 Chronic vascular disorders of intestine: Secondary | ICD-10-CM | POA: Diagnosis not present

## 2020-06-04 DIAGNOSIS — I5032 Chronic diastolic (congestive) heart failure: Secondary | ICD-10-CM | POA: Diagnosis not present

## 2020-06-04 DIAGNOSIS — E785 Hyperlipidemia, unspecified: Secondary | ICD-10-CM | POA: Diagnosis not present

## 2020-06-04 DIAGNOSIS — Z951 Presence of aortocoronary bypass graft: Secondary | ICD-10-CM | POA: Diagnosis not present

## 2020-06-04 DIAGNOSIS — I4891 Unspecified atrial fibrillation: Secondary | ICD-10-CM | POA: Diagnosis not present

## 2020-06-04 DIAGNOSIS — I110 Hypertensive heart disease with heart failure: Secondary | ICD-10-CM | POA: Diagnosis not present

## 2020-06-04 DIAGNOSIS — I2510 Atherosclerotic heart disease of native coronary artery without angina pectoris: Secondary | ICD-10-CM | POA: Diagnosis not present

## 2020-06-04 DIAGNOSIS — G20 Parkinson's disease: Secondary | ICD-10-CM | POA: Diagnosis not present

## 2020-06-04 DIAGNOSIS — E119 Type 2 diabetes mellitus without complications: Secondary | ICD-10-CM | POA: Diagnosis not present

## 2020-06-04 DIAGNOSIS — I160 Hypertensive urgency: Secondary | ICD-10-CM | POA: Diagnosis not present

## 2020-06-05 DIAGNOSIS — K4090 Unilateral inguinal hernia, without obstruction or gangrene, not specified as recurrent: Secondary | ICD-10-CM | POA: Diagnosis not present

## 2020-06-05 DIAGNOSIS — I4891 Unspecified atrial fibrillation: Secondary | ICD-10-CM | POA: Diagnosis not present

## 2020-06-05 DIAGNOSIS — K4030 Unilateral inguinal hernia, with obstruction, without gangrene, not specified as recurrent: Secondary | ICD-10-CM | POA: Diagnosis not present

## 2020-06-05 DIAGNOSIS — K551 Chronic vascular disorders of intestine: Secondary | ICD-10-CM | POA: Diagnosis not present

## 2020-06-05 DIAGNOSIS — K56609 Unspecified intestinal obstruction, unspecified as to partial versus complete obstruction: Secondary | ICD-10-CM | POA: Diagnosis not present

## 2020-06-05 DIAGNOSIS — E119 Type 2 diabetes mellitus without complications: Secondary | ICD-10-CM | POA: Diagnosis not present

## 2020-06-05 DIAGNOSIS — E785 Hyperlipidemia, unspecified: Secondary | ICD-10-CM | POA: Diagnosis not present

## 2020-06-05 DIAGNOSIS — K55021 Focal (segmental) acute infarction of small intestine: Secondary | ICD-10-CM | POA: Diagnosis not present

## 2020-06-05 DIAGNOSIS — I5032 Chronic diastolic (congestive) heart failure: Secondary | ICD-10-CM | POA: Diagnosis not present

## 2020-06-05 DIAGNOSIS — G20 Parkinson's disease: Secondary | ICD-10-CM | POA: Diagnosis not present

## 2020-06-05 DIAGNOSIS — Z7901 Long term (current) use of anticoagulants: Secondary | ICD-10-CM | POA: Diagnosis not present

## 2020-06-06 DIAGNOSIS — K449 Diaphragmatic hernia without obstruction or gangrene: Secondary | ICD-10-CM | POA: Diagnosis not present

## 2020-06-06 DIAGNOSIS — G20 Parkinson's disease: Secondary | ICD-10-CM | POA: Diagnosis not present

## 2020-06-06 DIAGNOSIS — Z7901 Long term (current) use of anticoagulants: Secondary | ICD-10-CM | POA: Diagnosis not present

## 2020-06-06 DIAGNOSIS — E119 Type 2 diabetes mellitus without complications: Secondary | ICD-10-CM | POA: Diagnosis not present

## 2020-06-06 DIAGNOSIS — I4891 Unspecified atrial fibrillation: Secondary | ICD-10-CM | POA: Diagnosis not present

## 2020-06-06 DIAGNOSIS — I5032 Chronic diastolic (congestive) heart failure: Secondary | ICD-10-CM | POA: Diagnosis not present

## 2020-06-06 DIAGNOSIS — E785 Hyperlipidemia, unspecified: Secondary | ICD-10-CM | POA: Diagnosis not present

## 2020-06-06 DIAGNOSIS — K56609 Unspecified intestinal obstruction, unspecified as to partial versus complete obstruction: Secondary | ICD-10-CM | POA: Diagnosis not present

## 2020-06-06 DIAGNOSIS — K4030 Unilateral inguinal hernia, with obstruction, without gangrene, not specified as recurrent: Secondary | ICD-10-CM | POA: Diagnosis not present

## 2020-06-06 DIAGNOSIS — I517 Cardiomegaly: Secondary | ICD-10-CM | POA: Diagnosis not present

## 2020-06-07 ENCOUNTER — Telehealth: Payer: Self-pay | Admitting: Internal Medicine

## 2020-06-07 NOTE — Telephone Encounter (Signed)
[facility] FYI:  --Caller states that patient is having hernia pain and bulging on right side. Caller states no other sx  Advised to go to ED now

## 2020-06-09 ENCOUNTER — Other Ambulatory Visit: Payer: Self-pay | Admitting: Internal Medicine

## 2020-06-10 ENCOUNTER — Ambulatory Visit: Payer: Medicare HMO | Admitting: Adult Health

## 2020-06-15 ENCOUNTER — Other Ambulatory Visit: Payer: Self-pay | Admitting: Neurology

## 2020-07-04 ENCOUNTER — Encounter: Payer: Self-pay | Admitting: Internal Medicine

## 2020-07-08 NOTE — Progress Notes (Signed)
Assessment/Plan:   1.  [name]  -continue carbidopa/levodopa 25/100, 2 tablets at 7 AM/1 tablet at 11 AM/2 tablets at 3 PM/1 tablet at 7 PM 2.  History of TIA in August, 2020 and April, 2021  -Has followed with Dr. Leonie Man  -On lovastatin  -Cardiology removed the aspirin when they stated him on apixaban.  -use walker! 3.  Atrial fibrillation (PAF)  -On apixaban.  Followed by Dr. Percival Spanish.  -pt asked about this and explained to best of my ability today  Subjective:   Brandon Rojas was seen today in follow up for [name].  My previous records were reviewed prior to todays visit as well as outside records available to me. Pt denies falls.  Pt denies lightheadedness, near syncope.  No hallucinations.  Mood has been good.  Was in the hospital in may with incarcerated hernia and had to have colon resection.  He has fully recovered.  He didn't need any PT - was assessed and told he didn't need therapy.  Current prescribed movement disorder medications: carbidopa/levodopa 25/100, 2 tablets at 7 AM/1 tablet at 11 AM/2 tablets at 3 PM/1 tablet at 7 PM    ALLERGIES:   Allergies  Allergen Reactions   Atorvastatin Other (See Comments)    Muscle cramps   Simvastatin Other (See Comments)    Muscle cramps   Statins Hives    pts wife states its two statin medications he is allergic to    CURRENT MEDICATIONS:  Outpatient Encounter Medications as of 07/12/2020  Medication Sig   albuterol (VENTOLIN HFA) 108 (90 Base) MCG/ACT inhaler Inhale 1-2 puffs into the lungs every 6 (six) hours as needed for wheezing or shortness of breath.   apixaban (ELIQUIS) 5 MG TABS tablet Take 1 tablet (5 mg total) by mouth 2 (two) times daily.   apixaban (ELIQUIS) 5 MG TABS tablet TAKE 1 TABLET (5 MG TOTAL) BY MOUTH TWO TIMES DAILY.   B Complex Vitamins (VITAMIN B COMPLEX PO) Take 1 tablet by mouth as needed.   carbidopa-levodopa (SINEMET IR) 25-100 MG tablet TAKE 2 TABLETS AT 7 AM, 1 TABLET  AT 11 AM, 2 TABLETS AT 3 PM, AND 1 TABLET AT 7 PM   esomeprazole (NEXIUM) 40 MG capsule Take 1 capsule (40 mg total) by mouth daily.   hydrALAZINE (APRESOLINE) 25 MG tablet TAKE 1 TABLET (25 MG TOTAL) BY MOUTH EVERY EIGHT HOURS.   lovastatin (MEVACOR) 20 MG tablet TAKE 1 TABLET AT BEDTIME   Multiple Vitamin (MULTIVITAMIN WITH MINERALS) TABS tablet Take 1 tablet by mouth daily.   nitroGLYCERIN (NITROSTAT) 0.4 MG SL tablet PLACE 1 TABLET (0.4 MG TOTAL) UNDER THE TONGUE EVERY 5 (FIVE) MINUTES AS NEEDED.   nystatin (MYCOSTATIN) 100000 UNIT/ML suspension TAKE 5 MLS (500,000 UNITS TOTAL) BY MOUTH FOUR TIMES DAILY.   Polyethyl Glycol-Propyl Glycol (SYSTANE ULTRA) 0.4-0.3 % SOLN Apply to eye as directed.   polyethylene glycol powder (GLYCOLAX/MIRALAX) 17 GM/SCOOP powder DISSOLVE 1 CAPFUL (17 G) IN WATER AND DRINK DAILY AS NEEDED FOR MILD CONSTIPATION.   tiZANidine (ZANAFLEX) 4 MG tablet Take 1 tablet (4 mg total) by mouth every 6 (six) hours as needed for muscle spasms.   torsemide (DEMADEX) 20 MG tablet TAKE 1 TABLET (20 MG TOTAL) BY MOUTH TWO TIMES DAILY.   traMADol (ULTRAM) 50 MG tablet TAKE 1 TABLET BY MOUTH EVERY 6 HOURS AS NEEDED FOR PAIN   No facility-administered encounter medications on file as of 07/12/2020.    Objective:  PHYSICAL EXAMINATION:    VITALS:   Vitals:   07/12/20 1514  BP: 124/70  Pulse: 76  Resp: 18  SpO2: 98%  Weight: 136 lb (61.7 kg)  Height: 5\' 4"  (1.626 m)    GEN:  The patient appears stated age and is in NAD. HEENT:  Normocephalic, atraumatic.  The mucous membranes are moist. The superficial temporal arteries are without ropiness or tenderness. CV:  RRR (no a-fib today) Lungs:  CTAB Neck/HEME:  There are no carotid bruits bilaterally.  Neurological examination:  Orientation: The patient is alert and oriented x3. Cranial nerves: There is good facial symmetry with facial hypomimia. The speech is fluent and clear. Soft palate rises symmetrically and there is  no tongue deviation. Hearing is intact to conversational tone. Sensation: Sensation is intact to light touch throughout Motor: Strength is at least antigravity x4.  Movement examination: Tone: There is mild increased tone in the RUE Abnormal movements: none Coordination:  There is mod decremation with RAM's, with any form of RAMS, including alternating supination and pronation of the forearm, hand opening and closing, finger taps, heel taps and toe taps, R>L Gait and Station: The patient has mild difficulty arising out of a deep-seated chair without the use of the hands. 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   he drags the L leg  I have reviewed and interpreted the following labs independently    Chemistry      Component Value Date/Time   NA 143 04/11/2020 1204   K 3.5 04/11/2020 1204   CL 105 04/11/2020 1204   CO2 31 04/11/2020 1204   BUN 13 04/11/2020 1204   CREATININE 1.11 04/11/2020 1204      Component Value Date/Time   CALCIUM 9.4 04/11/2020 1204   ALKPHOS 110 04/11/2020 1204   AST 19 04/11/2020 1204   ALT 4 04/11/2020 1204   BILITOT 0.5 04/11/2020 1204       Lab Results  Component Value Date   WBC 6.1 04/11/2020   HGB 12.0 (L) 04/11/2020   HCT 37.3 (L) 04/11/2020   MCV 84.9 04/11/2020   PLT 200.0 04/11/2020    Lab Results  Component Value Date   TSH 4.470 02/26/2020     Total time spent on today's visit was 20 minutes, including both face-to-face time and nonface-to-face time.  Time included that spent on review of records (prior notes available to me/labs/imaging if pertinent), discussing treatment and goals, answering patient's questions and coordinating care.  Cc:  Biagio Borg, MD

## 2020-07-12 ENCOUNTER — Encounter: Payer: Self-pay | Admitting: Neurology

## 2020-07-12 ENCOUNTER — Other Ambulatory Visit: Payer: Self-pay

## 2020-07-12 ENCOUNTER — Ambulatory Visit: Payer: Medicare HMO | Admitting: Neurology

## 2020-07-12 VITALS — BP 124/70 | HR 76 | Resp 18 | Ht 64.0 in | Wt 136.0 lb

## 2020-07-12 DIAGNOSIS — G20 Parkinson's disease: Secondary | ICD-10-CM

## 2020-07-12 DIAGNOSIS — I4891 Unspecified atrial fibrillation: Secondary | ICD-10-CM | POA: Diagnosis not present

## 2020-07-12 MED ORDER — CARBIDOPA-LEVODOPA 25-100 MG PO TABS: ORAL_TABLET | ORAL | 2 refills | Status: DC

## 2020-07-12 NOTE — Patient Instructions (Signed)
No changes in medication.  I would recommend walker over the cane.  The physicians and staff at [facility] are committed to providing excellent care. You may receive a survey requesting feedback about your experience at our office. We strive to receive "very good" responses to the survey questions. If you feel that your experience would prevent you from giving the office a "very good " response, please contact our office to try to remedy the situation. We may be reached at [phone]. Thank you for taking the time out of your busy day to complete the survey.

## 2020-08-04 ENCOUNTER — Telehealth: Payer: Self-pay | Admitting: Internal Medicine

## 2020-08-04 NOTE — Chronic Care Management (AMB) (Signed)
  Chronic Care Management   Outreach Note  08/04/2020 Name: Brandon Rojas MRN: [id number] DOB: Nov 25, 1945  Referred by: Biagio Borg, MD Reason for referral : No chief complaint on file.   An unsuccessful telephone outreach was attempted today. The patient was referred to the pharmacist for assistance with care management and care coordination.   Follow Up Plan:   Lauretta Grill [facility] Scheduler

## 2020-08-05 DIAGNOSIS — R202 Paresthesia of skin: Secondary | ICD-10-CM | POA: Diagnosis not present

## 2020-08-05 DIAGNOSIS — Z7409 Other reduced mobility: Secondary | ICD-10-CM | POA: Diagnosis not present

## 2020-08-05 DIAGNOSIS — R0902 Hypoxemia: Secondary | ICD-10-CM | POA: Diagnosis not present

## 2020-08-05 DIAGNOSIS — R410 Disorientation, unspecified: Secondary | ICD-10-CM | POA: Diagnosis not present

## 2020-08-05 DIAGNOSIS — R531 Weakness: Secondary | ICD-10-CM | POA: Diagnosis not present

## 2020-08-05 DIAGNOSIS — I517 Cardiomegaly: Secondary | ICD-10-CM | POA: Diagnosis not present

## 2020-08-05 DIAGNOSIS — I2510 Atherosclerotic heart disease of native coronary artery without angina pectoris: Secondary | ICD-10-CM | POA: Diagnosis not present

## 2020-08-05 DIAGNOSIS — I4510 Unspecified right bundle-branch block: Secondary | ICD-10-CM | POA: Diagnosis not present

## 2020-08-05 DIAGNOSIS — R404 Transient alteration of awareness: Secondary | ICD-10-CM | POA: Diagnosis not present

## 2020-08-05 DIAGNOSIS — J811 Chronic pulmonary edema: Secondary | ICD-10-CM | POA: Diagnosis not present

## 2020-08-05 DIAGNOSIS — G20 Parkinson's disease: Secondary | ICD-10-CM | POA: Diagnosis not present

## 2020-08-05 DIAGNOSIS — I340 Nonrheumatic mitral (valve) insufficiency: Secondary | ICD-10-CM | POA: Diagnosis not present

## 2020-08-05 DIAGNOSIS — R4182 Altered mental status, unspecified: Secondary | ICD-10-CM | POA: Diagnosis not present

## 2020-08-05 DIAGNOSIS — I160 Hypertensive urgency: Secondary | ICD-10-CM | POA: Diagnosis not present

## 2020-08-05 DIAGNOSIS — E119 Type 2 diabetes mellitus without complications: Secondary | ICD-10-CM | POA: Diagnosis not present

## 2020-08-05 DIAGNOSIS — I491 Atrial premature depolarization: Secondary | ICD-10-CM | POA: Diagnosis not present

## 2020-08-05 DIAGNOSIS — Z7901 Long term (current) use of anticoagulants: Secondary | ICD-10-CM | POA: Diagnosis not present

## 2020-08-05 DIAGNOSIS — R29810 Facial weakness: Secondary | ICD-10-CM | POA: Diagnosis not present

## 2020-08-05 DIAGNOSIS — I4891 Unspecified atrial fibrillation: Secondary | ICD-10-CM | POA: Diagnosis not present

## 2020-08-05 DIAGNOSIS — Z87891 Personal history of nicotine dependence: Secondary | ICD-10-CM | POA: Diagnosis not present

## 2020-08-05 DIAGNOSIS — R29898 Other symptoms and signs involving the musculoskeletal system: Secondary | ICD-10-CM | POA: Diagnosis not present

## 2020-08-05 DIAGNOSIS — G459 Transient cerebral ischemic attack, unspecified: Secondary | ICD-10-CM | POA: Diagnosis not present

## 2020-08-05 DIAGNOSIS — R200 Anesthesia of skin: Secondary | ICD-10-CM | POA: Diagnosis not present

## 2020-08-05 DIAGNOSIS — R1310 Dysphagia, unspecified: Secondary | ICD-10-CM | POA: Diagnosis not present

## 2020-08-06 DIAGNOSIS — G459 Transient cerebral ischemic attack, unspecified: Secondary | ICD-10-CM | POA: Diagnosis not present

## 2020-08-09 ENCOUNTER — Encounter: Payer: Self-pay | Admitting: Internal Medicine

## 2020-08-11 ENCOUNTER — Ambulatory Visit (INDEPENDENT_AMBULATORY_CARE_PROVIDER_SITE_OTHER): Payer: Medicare HMO | Admitting: Internal Medicine

## 2020-08-11 ENCOUNTER — Other Ambulatory Visit: Payer: Self-pay

## 2020-08-11 ENCOUNTER — Encounter: Payer: Self-pay | Admitting: Internal Medicine

## 2020-08-11 VITALS — BP 120/70 | HR 58 | Temp 98.2°F | Ht 64.0 in | Wt 135.0 lb

## 2020-08-11 DIAGNOSIS — E7800 Pure hypercholesterolemia, unspecified: Secondary | ICD-10-CM | POA: Diagnosis not present

## 2020-08-11 DIAGNOSIS — E559 Vitamin D deficiency, unspecified: Secondary | ICD-10-CM

## 2020-08-11 DIAGNOSIS — E118 Type 2 diabetes mellitus with unspecified complications: Secondary | ICD-10-CM

## 2020-08-11 DIAGNOSIS — E538 Deficiency of other specified B group vitamins: Secondary | ICD-10-CM | POA: Diagnosis not present

## 2020-08-11 DIAGNOSIS — I10 Essential (primary) hypertension: Secondary | ICD-10-CM | POA: Diagnosis not present

## 2020-08-11 DIAGNOSIS — I700 Atherosclerosis of aorta: Secondary | ICD-10-CM | POA: Diagnosis not present

## 2020-08-11 DIAGNOSIS — E1169 Type 2 diabetes mellitus with other specified complication: Secondary | ICD-10-CM | POA: Diagnosis not present

## 2020-08-11 DIAGNOSIS — Z0000 Encounter for general adult medical examination without abnormal findings: Secondary | ICD-10-CM

## 2020-08-11 NOTE — Patient Instructions (Signed)
Ok to stop the hydralazine, lisinopril, and the torsemide  Please continue all other medications as before, and refills have been done if requested.  Please have the pharmacy call with any other refills you may need.  Please continue your efforts at being more active, low cholesterol diet, and weight control.  Please keep your appointments with your specialists as you may have planned  Please make an Appointment to return in 6 months, or sooner if needed, also with Lab Appointment for testing done 3-5 days before at the [facility] (so this is for TWO appointments - please see the scheduling desk as you leave)  Due to the ongoing Covid 19 pandemic, our lab now requires an appointment for any labs done at our office.  If you need labs done and do not have an appointment, please call our office ahead of time to schedule before presenting to the lab for your testing.

## 2020-08-11 NOTE — Progress Notes (Signed)
Patient ID: Brandon Rojas, male   DOB: 03-06-1945, 75 y.o.   MRN: [id number]        Chief Complaint: follow up HTN, HLD and hyperglycemia , aortic atherosclerosis, and recent fall       HPI:  Brandon Rojas is a 75 y.o. male here with wife, overall doing ok but did have a recent fall, July 29, seen in ED with neg evaluation for TIA.  Has been getting weaker and only spotty taking his BP meds recently in fact none in last 2 days.  Pt denies chest pain, increased sob or doe, wheezing, orthopnea, PND, increased LE swelling, palpitations, syncope.   Pt denies polydipsia, polyuria, or new focal neuro s/s.   Pt denies fever, night sweats, loss of appetite, or other constitutional symptoms though has lost significant wt. No other new complaints       Wt Readings from Last 3 Encounters:  08/11/20 135 lb (61.2 kg)  07/12/20 136 lb (61.7 kg)  04/11/20 146 lb (66.2 kg)   BP Readings from Last 3 Encounters:  08/11/20 120/70  07/12/20 124/70  04/11/20 105/78         Past Medical History:  Diagnosis Date   Anxiety state, unspecified    Arthritis    Benign neoplasm of colon    CAD (coronary artery disease) of artery bypass graft 05/20/2018   Depressive disorder, not elsewhere classified    Diaphragmatic hernia without mention of obstruction or gangrene    Diverticulosis of colon (without mention of hemorrhage)    Esophageal reflux    Esophageal stricture    Hiatal hernia    Mitral regurgitation    Osteoarthrosis, unspecified whether generalized or localized, unspecified site    Other and unspecified hyperlipidemia    Parkinson's disease ([place])    Personal history of colonic polyps    Stroke ([place])    Unspecified essential hypertension    Past Surgical History:  Procedure Laterality Date   CARDIOVERSION N/A 03/01/2020   Procedure: CARDIOVERSION;  Surgeon: Pixie Casino, MD;  Location: [place];  Service: Cardiovascular;  Laterality: N/A;   COLONOSCOPY     CORONARY ARTERY BYPASS GRAFT      x 2   INGUINAL HERNIA REPAIR     bilateral   IR EXCHANGE BILIARY DRAIN  11/06/2018   IR PERC CHOLECYSTOSTOMY  05/22/2018   IR RADIOLOGIST EVAL & MGMT  06/24/2018   IR RADIOLOGIST EVAL & MGMT  07/01/2018   LUNG BIOPSY     POLYPECTOMY     PTCA     TEE WITHOUT CARDIOVERSION N/A 03/01/2020   Procedure: TRANSESOPHAGEAL ECHOCARDIOGRAM (TEE);  Surgeon: Pixie Casino, MD;  Location: [facility] ENDOSCOPY;  Service: Cardiovascular;  Laterality: N/A;    reports that he quit smoking about 48 years ago. His smoking use included cigarettes. He has never used smokeless tobacco. He reports that he does not drink alcohol and does not use drugs. family history includes Colon cancer in his sister; Fibromyalgia in his daughter; Healthy in his brother, brother, brother, daughter, and sister; Heart attack in his mother; Heart disease in his brother; Multiple sclerosis in his son; Stroke in his father. Allergies  Allergen Reactions   Atorvastatin Other (See Comments)    Muscle cramps   Simvastatin Other (See Comments)    Muscle cramps   Statins Hives    pts wife states its two statin medications he is allergic to   Current Outpatient Medications on File Prior to Visit  Medication Sig  Dispense Refill   albuterol (VENTOLIN HFA) 108 (90 Base) MCG/ACT inhaler Inhale 1-2 puffs into the lungs every 6 (six) hours as needed for wheezing or shortness of breath. 18 g 4   apixaban (ELIQUIS) 5 MG TABS tablet TAKE 1 TABLET (5 MG TOTAL) BY MOUTH TWO TIMES DAILY. 180 tablet 3   B Complex Vitamins (VITAMIN B COMPLEX PO) Take 1 tablet by mouth as needed.     carbidopa-levodopa (SINEMET IR) 25-100 MG tablet 2 at 7 AM/1 at 11 AM/2 at 3 PM/1 tblet at 7 PM 540 tablet 2   esomeprazole (NEXIUM) 40 MG capsule Take 1 capsule (40 mg total) by mouth daily. 90 capsule 3   lovastatin (MEVACOR) 20 MG tablet TAKE 1 TABLET AT BEDTIME 90 tablet 1   Multiple Vitamin (MULTIVITAMIN WITH MINERALS) TABS tablet Take 1 tablet by mouth daily.      nitroGLYCERIN (NITROSTAT) 0.4 MG SL tablet PLACE 1 TABLET (0.4 MG TOTAL) UNDER THE TONGUE EVERY 5 (FIVE) MINUTES AS NEEDED. 50 tablet 3   Polyethyl Glycol-Propyl Glycol (SYSTANE ULTRA) 0.4-0.3 % SOLN Apply to eye as directed.     traMADol (ULTRAM) 50 MG tablet TAKE 1 TABLET BY MOUTH EVERY 6 HOURS AS NEEDED FOR PAIN 120 tablet 5   tiZANidine (ZANAFLEX) 2 MG tablet Take by mouth.     No current facility-administered medications on file prior to visit.        ROS:  All others reviewed and negative.  Objective        PE:  BP 120/70 (BP Location: Left Arm, Patient Position: Sitting, Cuff Size: Normal)   Pulse (!) 58   Temp 98.2 F (36.8 C) (Oral)   Ht '5\' 4"'$  (1.626 m)   Wt 135 lb (61.2 kg)   SpO2 98%   BMI 23.17 kg/m                 Constitutional: Pt appears in NAD               HENT: Head: NCAT.                Right Ear: External ear normal.                 Left Ear: External ear normal.                Eyes: . Pupils are equal, round, and reactive to light. Conjunctivae and EOM are normal               Nose: without d/c or deformity               Neck: Neck supple. Gross normal ROM               Cardiovascular: Normal rate and regular rhythm.                 Pulmonary/Chest: Effort normal and breath sounds without rales or wheezing.                Abd:  Soft, NT, ND, + BS, no organomegaly               Neurological: Pt is alert. At baseline orientation, motor grossly intact               Skin: Skin is warm. No rashes, no other new lesions, LE edema - none               Psychiatric: Pt behavior is normal  without agitation   Micro: none  Cardiac tracings I have personally interpreted today:  none  Pertinent Radiological findings (summarize): none   Lab Results  Component Value Date   WBC 6.1 04/11/2020   HGB 12.0 (L) 04/11/2020   HCT 37.3 (L) 04/11/2020   PLT 200.0 04/11/2020   GLUCOSE 84 04/11/2020   CHOL 113 04/11/2020   TRIG 118.0 04/11/2020   HDL 51.10 04/11/2020    LDLCALC 38 04/11/2020   ALT 4 04/11/2020   AST 19 04/11/2020   NA 143 04/11/2020   K 3.5 04/11/2020   CL 105 04/11/2020   CREATININE 1.11 04/11/2020   BUN 13 04/11/2020   CO2 31 04/11/2020   TSH 4.470 02/26/2020   PSA 1.23 09/04/2018   INR 2.2 (H) 03/01/2020   HGBA1C 5.6 04/11/2020   Assessment/Plan:  Brandon Rojas is a 75 y.o. White or Caucasian [1] male with  has a past medical history of Anxiety state, unspecified, Arthritis, Benign neoplasm of colon, CAD (coronary artery disease) of artery bypass graft (05/20/2018), Depressive disorder, not elsewhere classified, Diaphragmatic hernia without mention of obstruction or gangrene, Diverticulosis of colon (without mention of hemorrhage), Esophageal reflux, Esophageal stricture, Hiatal hernia, Mitral regurgitation, Osteoarthrosis, unspecified whether generalized or localized, unspecified site, Other and unspecified hyperlipidemia, Parkinson's disease ([place]), Personal history of colonic polyps, Stroke ([place]), and Unspecified essential hypertension.  Type 2 diabetes mellitus with complication, without long-term current use of insulin (HCC) Lab Results  Component Value Date   HGBA1C 5.6 04/11/2020   Stable, pt to continue current medical treatment  - diet   Hyperlipidemia Lab Results  Component Value Date   LDLCALC 38 04/11/2020   Stable, pt to continue current statin lovastatin   Essential hypertension With recent fall possibly with element of overcontrolled BP in the setting of parkinsons - to d/c hydralazine, lisinopril, and torsemide,  to f/u any worsening symptoms or concerns  Aortic atherosclerosis (HCC) Pt to continue lovastatin , low chol diet and excercise  Followup: Return in about 6 months (around 02/11/2021).  [name], MD 08/13/2020 8:32 PM [facility]

## 2020-08-12 DIAGNOSIS — I700 Atherosclerosis of aorta: Secondary | ICD-10-CM | POA: Insufficient documentation

## 2020-08-13 ENCOUNTER — Encounter: Payer: Self-pay | Admitting: Internal Medicine

## 2020-08-13 NOTE — Assessment & Plan Note (Signed)
With recent fall possibly with element of overcontrolled BP in the setting of parkinsons - to d/c hydralazine, lisinopril, and torsemide,  to f/u any worsening symptoms or concerns

## 2020-08-13 NOTE — Addendum Note (Signed)
Addended by: Biagio Borg on: 08/13/2020 08:35 PM   Modules accepted: Orders

## 2020-08-13 NOTE — Assessment & Plan Note (Signed)
Lab Results  Component Value Date   HGBA1C 5.6 04/11/2020   Stable, pt to continue current medical treatment  - diet

## 2020-08-13 NOTE — Assessment & Plan Note (Signed)
Pt to continue lovastatin , low chol diet and excercise

## 2020-08-13 NOTE — Assessment & Plan Note (Signed)
Lab Results  Component Value Date   LDLCALC 38 04/11/2020   Stable, pt to continue current statin lovastatin

## 2020-08-26 ENCOUNTER — Other Ambulatory Visit: Payer: Self-pay | Admitting: Internal Medicine

## 2020-08-26 NOTE — Telephone Encounter (Signed)
Please refill as per office routine med refill policy (all routine meds refilled for 3 mo or monthly per pt preference up to one year from last visit, then month to month grace period for 3 mo, then further med refills will have to be denied)  

## 2020-09-04 NOTE — Progress Notes (Signed)
Cardiology Office Note   Date:  09/06/2020   ID:  Brandon Rojas, Brandon Rojas Feb 24, 1945, MRN [id number]  PCP:  Biagio Borg, MD  Cardiologist:  Minus Breeding, MD EP: None  Chief Complaint  Patient presents with   Follow-up    CHF/Afib       History of Present Illness: Brandon Rojas is a 75 y.o. male with a PMH of CAD s/p CABG in 2006, paroxysmal atrial fibrillation, chronic diastolic CHF, mitral regurgitation/tricuspid regurgitation, HTN, HLD, DM type 2, Parkinson's disease, and CVA, who presents for routine follow-up  He was last evaluated by cardiology at an outpatient visit with Dr. Percival Spanish 03/2020 at which time he was improved from a cardiac standpoint with no change in chronic LE edema and no complaints of SOB, PND, or orthopnea. He was maintaining sinus rhythm at follow-up and volume status was stable on prn torsemide. No medication changes occurred and he was recommended to follow-up in 4 months.   Prior to this he was admitted to the hospital 02/2020 with right-sided HF and new onset atrial fibrillation. He was markedly volume overloaded that admission and aggressively diuresed with IV lasix. His last echocardiogram 02/2020 showed EF 60-65%, indeterminate LV diastolic function, mild LVH, no RWMA, interventricular septum flattening with systole and diastole c/w RV pressure and volume overload, moderate LAE, severely dilated RV, mild MR, and severe TR. He ultimately underwent TEE/DCCV  02/2020 for management of persistent atrial fibrillation with successful return to sinus rhythm.   He presents today for follow-up of his CHF/Afib with his wife. He has had several ED visits and on admission for hernia repair since his last visit. Overall he has been doing okay from a cardiac standpoint. He has chronic stable DOE and mild LE edema which is improved with zip-on compression stockings. He has been compliant with his eliquis. We reviewed the nature of atrial fibrillation per his request. He denies  chest pain, SOB at rest, palpitations, dizziness, lightheadedness, syncope, orthopnea, or PND. No complaints of bleeding on eliquis. They monitor his BP occasionally at home and report good control. Office BP is high today but with history of PD and autonomic dysfunction associated with this, will avoid aggressive BP management at this time. They will continue home BP monitoring more regularly.     Past Medical History:  Diagnosis Date   Anxiety state, unspecified    Arthritis    Benign neoplasm of colon    CAD (coronary artery disease) of artery bypass graft 05/20/2018   Depressive disorder, not elsewhere classified    Diaphragmatic hernia without mention of obstruction or gangrene    Diverticulosis of colon (without mention of hemorrhage)    Esophageal reflux    Esophageal stricture    Hiatal hernia    Mitral regurgitation    Osteoarthrosis, unspecified whether generalized or localized, unspecified site    Other and unspecified hyperlipidemia    Parkinson's disease ([place])    Personal history of colonic polyps    Stroke [facility])    Unspecified essential hypertension     Past Surgical History:  Procedure Laterality Date   CARDIOVERSION N/A 03/01/2020   Procedure: CARDIOVERSION;  Surgeon: Pixie Casino, MD;  Location: [place] [facility];  Service: Cardiovascular;  Laterality: N/A;   COLONOSCOPY     CORONARY ARTERY BYPASS GRAFT     x 2   INGUINAL HERNIA REPAIR     bilateral   IR EXCHANGE BILIARY DRAIN  11/06/2018   IR PERC CHOLECYSTOSTOMY  05/22/2018  IR RADIOLOGIST EVAL & MGMT  06/24/2018   IR RADIOLOGIST EVAL & MGMT  07/01/2018   LUNG BIOPSY     POLYPECTOMY     PTCA     TEE WITHOUT CARDIOVERSION N/A 03/01/2020   Procedure: TRANSESOPHAGEAL ECHOCARDIOGRAM (TEE);  Surgeon: Pixie Casino, MD;  Location: [facility] ENDOSCOPY;  Service: Cardiovascular;  Laterality: N/A;     Current Outpatient Medications  Medication Sig Dispense Refill   albuterol (VENTOLIN HFA) 108 (90 Base) MCG/ACT inhaler  Inhale 1-2 puffs into the lungs every 6 (six) hours as needed for wheezing or shortness of breath. 18 g 4   apixaban (ELIQUIS) 5 MG TABS tablet TAKE 1 TABLET (5 MG TOTAL) BY MOUTH TWO TIMES DAILY. 180 tablet 3   B Complex Vitamins (VITAMIN B COMPLEX PO) Take 1 tablet by mouth as needed.     carbidopa-levodopa (SINEMET IR) 25-100 MG tablet 2 at 7 AM/1 at 11 AM/2 at 3 PM/1 tblet at 7 PM 540 tablet 2   esomeprazole (NEXIUM) 40 MG capsule Take 1 capsule (40 mg total) by mouth daily. 90 capsule 3   lovastatin (MEVACOR) 20 MG tablet TAKE 1 TABLET AT BEDTIME 90 tablet 1   Multiple Vitamin (MULTIVITAMIN WITH MINERALS) TABS tablet Take 1 tablet by mouth daily.     nitroGLYCERIN (NITROSTAT) 0.4 MG SL tablet PLACE 1 TABLET (0.4 MG TOTAL) UNDER THE TONGUE EVERY 5 (FIVE) MINUTES AS NEEDED. 50 tablet 3   Polyethyl Glycol-Propyl Glycol (SYSTANE ULTRA) 0.4-0.3 % SOLN Apply to eye as directed.     tiZANidine (ZANAFLEX) 2 MG tablet Take by mouth.     traMADol (ULTRAM) 50 MG tablet TAKE 1 TABLET BY MOUTH EVERY 6 HOURS AS NEEDED FOR PAIN 120 tablet 5   No current facility-administered medications for this visit.    Allergies:   Atorvastatin, Simvastatin, and Statins    Social History:  The patient  reports that he quit smoking about 48 years ago. His smoking use included cigarettes. He has never used smokeless tobacco. He reports that he does not drink alcohol and does not use drugs.   Family History:  The patient's family history includes Colon cancer in his sister; Fibromyalgia in his daughter; Healthy in his brother, brother, brother, daughter, and sister; Heart attack in his mother; Heart disease in his brother; Multiple sclerosis in his son; Stroke in his father.    ROS:  Please see the history of present illness.   Otherwise, review of systems are positive for none.   All other systems are reviewed and negative.    PHYSICAL EXAM: VS:  BP (!) 156/80   Pulse (!) 54   Ht '5\' 4"'$  (1.626 m)   Wt 136 lb 1.6  oz (61.7 kg)   SpO2 97%   BMI 23.36 kg/m  , BMI Body mass index is 23.36 kg/m. GEN: Well nourished, well developed, in no acute distress HEENT: sclera anicteric Neck: no JVD, carotid bruits, or masses Cardiac: bradycardic, regular rhythm; + murmur, no rubs or gallops, trace LE edema with compression stockings in place  Respiratory:  clear to auscultation bilaterally, normal work of breathing GI: soft, nontender, nondistended, + BS MS: no deformity or atrophy Skin: warm and dry, no rash Neuro:  Strength and sensation are intact Psych: euthymic mood, full affect   EKG:  EKG is not ordered today.    Recent Labs: 02/26/2020: B Natriuretic Peptide 598.7; TSH 4.470 03/02/2020: Magnesium 1.9 04/11/2020: ALT 4; BUN 13; Creatinine, Ser 1.11; Hemoglobin 12.0; Platelets 200.0;  Potassium 3.5; Sodium 143    Lipid Panel    Component Value Date/Time   CHOL 113 04/11/2020 1204   TRIG 118.0 04/11/2020 1204   HDL 51.10 04/11/2020 1204   CHOLHDL 2 04/11/2020 1204   VLDL 23.6 04/11/2020 1204   LDLCALC 38 04/11/2020 1204      Wt Readings from Last 3 Encounters:  09/06/20 136 lb 1.6 oz (61.7 kg)  08/11/20 135 lb (61.2 kg)  07/12/20 136 lb (61.7 kg)      Other studies Reviewed: Additional studies/ records that were reviewed today include:   Echocardiogram 08/2018: 1. The left ventricle has normal systolic function with an ejection  fraction of 60-65%. The cavity size was normal. Left ventricular diastolic  parameters were normal.   2. The right ventricle has normal systolic function. The cavity was  normal. There is no increase in right ventricular wall thickness.   3. Left atrial size was moderately dilated.   4. Right atrial size was moderately dilated.   5. The mitral valve is degenerative. Mild thickening of the mitral valve  leaflet. Mild calcification of the mitral valve leaflet.   6. Mild prolapse of anterior leaflet.   7. Tricuspid valve regurgitation is moderate.   8. The  aortic valve is tricuspid. Moderate thickening of the aortic  valve. Sclerosis without any evidence of stenosis of the aortic valve.   9. The aorta is normal unless otherwise noted.    Echocardiogram 02/26/20: 1. Left ventricular ejection fraction, by estimation, is 60 to 65%. The  left ventricle has normal function. The left ventricle has no regional  wall motion abnormalities. There is mild left ventricular hypertrophy.  Left ventricular diastolic parameters  are indeterminate. There is the interventricular septum is flattened in  systole and diastole, consistent with right ventricular pressure and  volume overload.   2. Right ventricular systolic function is moderately reduced. The right  ventricular size is moderately enlarged. There is normal pulmonary artery  systolic pressure. The estimated right ventricular systolic pressure is  [id number] mmHg.   3. Left atrial size was moderately dilated.   4. Right atrial size was severely dilated.   5. The mitral valve is normal in structure. Mild mitral valve  regurgitation. No evidence of mitral stenosis.   6. Tricuspid valve regurgitation is severe.   7. The aortic valve is grossly normal. Aortic valve regurgitation is not  visualized. Mild aortic valve sclerosis is present, with no evidence of  aortic valve stenosis.   8. Pulmonic valve regurgitation is moderate.   9. The inferior vena cava is dilated in size with <50% respiratory  variability, suggesting right atrial pressure of 15 mmHg.       ASSESSMENT AND PLAN:  1. Chronic diastolic CHF/Right-sided heart failure: continues to have chronic LE edema which is stable - Continue compression stockings - Continue to encourage low salt diet  2. Paroxysmal atrial fibrillation: maintaining sinus rhythm with HR 54 bpm  today, not on rate controlling medications - Continue eliquis for stroke ppx  3. CAD s/p CABG in 2006: no anginal complaints. No longer on aspirin due to need for eliquis -  Continue statin  4. HTN: BP elevated to 156/80 today, not on medications at baseline. Given history of PD and risk for autonomic dysfunction, will avoid aggressive BP management at this time. Patient instructed to monitor BP at home and notify the office if consistently >130/80 - Continue routine monitoring  4. HLD: LDL 38 04/2020 - Continue lovastatin  5.  DM type 2: A1C 5.6 04/2020; not on medications - Continue dietary modifications  6. Valvulopathy: moderate MR and moderate to severe TR on TEE 02/2020. No change in symptoms to suggest significant progression - Continue routine monitoring - anticipate surveillance monitoring next year which can be arranged at his next follow-up visit.   7. History of CVA: no recent neurologic complaints. Now on eliquis for #2.  - Continue eliquis and statin   Current medicines are reviewed at length with the patient today.  The patient does not have concerns regarding medicines.  The following changes have been made:  As above  Labs/ tests ordered today include:  No orders of the defined types were placed in this encounter.    Disposition:   FU with Dr. Percival Spanish in 6 months  Signed, Abigail Butts, PA-C  09/06/2020 10:40 AM

## 2020-09-06 ENCOUNTER — Encounter: Payer: Self-pay | Admitting: Medical

## 2020-09-06 ENCOUNTER — Other Ambulatory Visit: Payer: Self-pay

## 2020-09-06 ENCOUNTER — Ambulatory Visit (INDEPENDENT_AMBULATORY_CARE_PROVIDER_SITE_OTHER): Payer: Medicare HMO | Admitting: Medical

## 2020-09-06 VITALS — BP 156/80 | HR 54 | Ht 64.0 in | Wt 136.1 lb

## 2020-09-06 DIAGNOSIS — I10 Essential (primary) hypertension: Secondary | ICD-10-CM

## 2020-09-06 DIAGNOSIS — I5032 Chronic diastolic (congestive) heart failure: Secondary | ICD-10-CM | POA: Diagnosis not present

## 2020-09-06 DIAGNOSIS — I25700 Atherosclerosis of coronary artery bypass graft(s), unspecified, with unstable angina pectoris: Secondary | ICD-10-CM | POA: Diagnosis not present

## 2020-09-06 DIAGNOSIS — I071 Rheumatic tricuspid insufficiency: Secondary | ICD-10-CM

## 2020-09-06 DIAGNOSIS — I480 Paroxysmal atrial fibrillation: Secondary | ICD-10-CM | POA: Diagnosis not present

## 2020-09-06 DIAGNOSIS — E119 Type 2 diabetes mellitus without complications: Secondary | ICD-10-CM | POA: Diagnosis not present

## 2020-09-06 DIAGNOSIS — I340 Nonrheumatic mitral (valve) insufficiency: Secondary | ICD-10-CM | POA: Diagnosis not present

## 2020-09-06 DIAGNOSIS — Z951 Presence of aortocoronary bypass graft: Secondary | ICD-10-CM

## 2020-09-06 DIAGNOSIS — E785 Hyperlipidemia, unspecified: Secondary | ICD-10-CM

## 2020-09-06 DIAGNOSIS — Z8673 Personal history of transient ischemic attack (TIA), and cerebral infarction without residual deficits: Secondary | ICD-10-CM

## 2020-09-06 NOTE — Patient Instructions (Signed)
Medication Instructions:  No Changes *If you need a refill on your cardiac medications before your next appointment, please call your pharmacy*   Lab Work: No Labs If you have labs (blood work) drawn today and your tests are completely normal, you will receive your results only by: [place] (if you have MyChart) OR A paper copy in the mail If you have any lab test that is abnormal or we need to change your treatment, we will call you to review the results.   Testing/Procedures: No Testing   Follow-Up: At [facility], you and your health needs are our priority.  As part of our continuing mission to provide you with exceptional heart care, we have created designated Provider Care Teams.  These Care Teams include your primary Cardiologist (physician) and Advanced Practice Providers (APPs -  Physician Assistants and Nurse Practitioners) who all work together to provide you with the care you need, when you need it.  We recommend signing up for the patient portal called "MyChart".  Sign up information is provided on this After Visit Summary.  MyChart is used to connect with patients for Virtual Visits (Telemedicine).  Patients are able to view lab/test results, encounter notes, upcoming appointments, etc.  Non-urgent messages can be sent to your provider as well.   To learn more about what you can do with MyChart, go to [email or web address].    Your next appointment:   6 month(s)  The format for your next appointment:   In Person  Provider:   Minus Breeding, MD   Other Instructions Daily Blood Pressure Log . If Blood Pressure Greater 130/80 Call The Office.

## 2020-09-10 DIAGNOSIS — K219 Gastro-esophageal reflux disease without esophagitis: Secondary | ICD-10-CM | POA: Diagnosis not present

## 2020-09-10 DIAGNOSIS — G319 Degenerative disease of nervous system, unspecified: Secondary | ICD-10-CM | POA: Diagnosis not present

## 2020-09-10 DIAGNOSIS — I6621 Occlusion and stenosis of right posterior cerebral artery: Secondary | ICD-10-CM | POA: Diagnosis not present

## 2020-09-10 DIAGNOSIS — G969 Disorder of central nervous system, unspecified: Secondary | ICD-10-CM | POA: Diagnosis not present

## 2020-09-10 DIAGNOSIS — D649 Anemia, unspecified: Secondary | ICD-10-CM | POA: Diagnosis not present

## 2020-09-10 DIAGNOSIS — R29898 Other symptoms and signs involving the musculoskeletal system: Secondary | ICD-10-CM | POA: Diagnosis not present

## 2020-09-10 DIAGNOSIS — I110 Hypertensive heart disease with heart failure: Secondary | ICD-10-CM | POA: Diagnosis not present

## 2020-09-10 DIAGNOSIS — I6501 Occlusion and stenosis of right vertebral artery: Secondary | ICD-10-CM | POA: Diagnosis not present

## 2020-09-10 DIAGNOSIS — I69354 Hemiplegia and hemiparesis following cerebral infarction affecting left non-dominant side: Secondary | ICD-10-CM | POA: Diagnosis not present

## 2020-09-10 DIAGNOSIS — I639 Cerebral infarction, unspecified: Secondary | ICD-10-CM | POA: Diagnosis not present

## 2020-09-10 DIAGNOSIS — I63532 Cerebral infarction due to unspecified occlusion or stenosis of left posterior cerebral artery: Secondary | ICD-10-CM | POA: Diagnosis not present

## 2020-09-10 DIAGNOSIS — I6381 Other cerebral infarction due to occlusion or stenosis of small artery: Secondary | ICD-10-CM | POA: Diagnosis not present

## 2020-09-10 DIAGNOSIS — I5032 Chronic diastolic (congestive) heart failure: Secondary | ICD-10-CM | POA: Diagnosis not present

## 2020-09-10 DIAGNOSIS — I272 Pulmonary hypertension, unspecified: Secondary | ICD-10-CM | POA: Diagnosis not present

## 2020-09-10 DIAGNOSIS — I6523 Occlusion and stenosis of bilateral carotid arteries: Secondary | ICD-10-CM | POA: Diagnosis not present

## 2020-09-10 DIAGNOSIS — Z8669 Personal history of other diseases of the nervous system and sense organs: Secondary | ICD-10-CM | POA: Diagnosis not present

## 2020-09-10 DIAGNOSIS — R531 Weakness: Secondary | ICD-10-CM | POA: Diagnosis not present

## 2020-09-10 DIAGNOSIS — R29703 NIHSS score 3: Secondary | ICD-10-CM | POA: Diagnosis not present

## 2020-09-10 DIAGNOSIS — Z8679 Personal history of other diseases of the circulatory system: Secondary | ICD-10-CM | POA: Diagnosis not present

## 2020-09-10 DIAGNOSIS — I10 Essential (primary) hypertension: Secondary | ICD-10-CM | POA: Diagnosis not present

## 2020-09-10 DIAGNOSIS — I083 Combined rheumatic disorders of mitral, aortic and tricuspid valves: Secondary | ICD-10-CM | POA: Diagnosis not present

## 2020-09-10 DIAGNOSIS — I4891 Unspecified atrial fibrillation: Secondary | ICD-10-CM | POA: Diagnosis not present

## 2020-09-10 DIAGNOSIS — Z7901 Long term (current) use of anticoagulants: Secondary | ICD-10-CM | POA: Diagnosis not present

## 2020-09-11 DIAGNOSIS — Z8679 Personal history of other diseases of the circulatory system: Secondary | ICD-10-CM | POA: Diagnosis not present

## 2020-09-11 DIAGNOSIS — I10 Essential (primary) hypertension: Secondary | ICD-10-CM | POA: Diagnosis not present

## 2020-09-11 DIAGNOSIS — I63532 Cerebral infarction due to unspecified occlusion or stenosis of left posterior cerebral artery: Secondary | ICD-10-CM | POA: Diagnosis not present

## 2020-09-11 DIAGNOSIS — Z7901 Long term (current) use of anticoagulants: Secondary | ICD-10-CM | POA: Diagnosis not present

## 2020-09-12 DIAGNOSIS — I63532 Cerebral infarction due to unspecified occlusion or stenosis of left posterior cerebral artery: Secondary | ICD-10-CM | POA: Diagnosis not present

## 2020-09-12 DIAGNOSIS — I272 Pulmonary hypertension, unspecified: Secondary | ICD-10-CM | POA: Diagnosis not present

## 2020-09-12 DIAGNOSIS — I083 Combined rheumatic disorders of mitral, aortic and tricuspid valves: Secondary | ICD-10-CM | POA: Diagnosis not present

## 2020-09-12 DIAGNOSIS — Z8679 Personal history of other diseases of the circulatory system: Secondary | ICD-10-CM | POA: Diagnosis not present

## 2020-09-12 DIAGNOSIS — I10 Essential (primary) hypertension: Secondary | ICD-10-CM | POA: Diagnosis not present

## 2020-09-12 DIAGNOSIS — I639 Cerebral infarction, unspecified: Secondary | ICD-10-CM | POA: Diagnosis not present

## 2020-09-12 DIAGNOSIS — Z7901 Long term (current) use of anticoagulants: Secondary | ICD-10-CM | POA: Diagnosis not present

## 2020-09-13 DIAGNOSIS — I4510 Unspecified right bundle-branch block: Secondary | ICD-10-CM | POA: Diagnosis not present

## 2020-09-13 DIAGNOSIS — I493 Ventricular premature depolarization: Secondary | ICD-10-CM | POA: Diagnosis not present

## 2020-09-14 DIAGNOSIS — I69354 Hemiplegia and hemiparesis following cerebral infarction affecting left non-dominant side: Secondary | ICD-10-CM | POA: Diagnosis not present

## 2020-09-14 DIAGNOSIS — I110 Hypertensive heart disease with heart failure: Secondary | ICD-10-CM | POA: Diagnosis not present

## 2020-09-14 DIAGNOSIS — I6502 Occlusion and stenosis of left vertebral artery: Secondary | ICD-10-CM | POA: Diagnosis not present

## 2020-09-14 DIAGNOSIS — I4891 Unspecified atrial fibrillation: Secondary | ICD-10-CM | POA: Diagnosis not present

## 2020-09-14 DIAGNOSIS — E119 Type 2 diabetes mellitus without complications: Secondary | ICD-10-CM | POA: Diagnosis not present

## 2020-09-14 DIAGNOSIS — I5032 Chronic diastolic (congestive) heart failure: Secondary | ICD-10-CM | POA: Diagnosis not present

## 2020-09-14 DIAGNOSIS — I69328 Other speech and language deficits following cerebral infarction: Secondary | ICD-10-CM | POA: Diagnosis not present

## 2020-09-14 DIAGNOSIS — I6529 Occlusion and stenosis of unspecified carotid artery: Secondary | ICD-10-CM | POA: Diagnosis not present

## 2020-09-14 DIAGNOSIS — I2510 Atherosclerotic heart disease of native coronary artery without angina pectoris: Secondary | ICD-10-CM | POA: Diagnosis not present

## 2020-09-16 ENCOUNTER — Ambulatory Visit: Payer: Medicare HMO | Admitting: Internal Medicine

## 2020-09-16 DIAGNOSIS — I4891 Unspecified atrial fibrillation: Secondary | ICD-10-CM | POA: Diagnosis not present

## 2020-09-16 DIAGNOSIS — I110 Hypertensive heart disease with heart failure: Secondary | ICD-10-CM | POA: Diagnosis not present

## 2020-09-16 DIAGNOSIS — I6502 Occlusion and stenosis of left vertebral artery: Secondary | ICD-10-CM | POA: Diagnosis not present

## 2020-09-16 DIAGNOSIS — I6529 Occlusion and stenosis of unspecified carotid artery: Secondary | ICD-10-CM | POA: Diagnosis not present

## 2020-09-16 DIAGNOSIS — I69328 Other speech and language deficits following cerebral infarction: Secondary | ICD-10-CM | POA: Diagnosis not present

## 2020-09-16 DIAGNOSIS — I5032 Chronic diastolic (congestive) heart failure: Secondary | ICD-10-CM | POA: Diagnosis not present

## 2020-09-16 DIAGNOSIS — I2510 Atherosclerotic heart disease of native coronary artery without angina pectoris: Secondary | ICD-10-CM | POA: Diagnosis not present

## 2020-09-16 DIAGNOSIS — I69354 Hemiplegia and hemiparesis following cerebral infarction affecting left non-dominant side: Secondary | ICD-10-CM | POA: Diagnosis not present

## 2020-09-16 DIAGNOSIS — E119 Type 2 diabetes mellitus without complications: Secondary | ICD-10-CM | POA: Diagnosis not present

## 2020-09-20 ENCOUNTER — Encounter: Payer: Self-pay | Admitting: Internal Medicine

## 2020-09-20 ENCOUNTER — Ambulatory Visit (INDEPENDENT_AMBULATORY_CARE_PROVIDER_SITE_OTHER): Payer: Medicare HMO | Admitting: Internal Medicine

## 2020-09-20 ENCOUNTER — Other Ambulatory Visit: Payer: Self-pay

## 2020-09-20 VITALS — BP 130/72 | HR 50 | Temp 98.2°F | Ht 64.0 in | Wt 134.0 lb

## 2020-09-20 DIAGNOSIS — Z7901 Long term (current) use of anticoagulants: Secondary | ICD-10-CM

## 2020-09-20 DIAGNOSIS — I10 Essential (primary) hypertension: Secondary | ICD-10-CM | POA: Diagnosis not present

## 2020-09-20 DIAGNOSIS — I6381 Other cerebral infarction due to occlusion or stenosis of small artery: Secondary | ICD-10-CM

## 2020-09-20 DIAGNOSIS — E118 Type 2 diabetes mellitus with unspecified complications: Secondary | ICD-10-CM | POA: Diagnosis not present

## 2020-09-20 LAB — BASIC METABOLIC PANEL
BUN: 20 mg/dL (ref 6–23)
CO2: 28 mEq/L (ref 19–32)
Calcium: 9.9 mg/dL (ref 8.4–10.5)
Chloride: 105 mEq/L (ref 96–112)
Creatinine, Ser: 1.34 mg/dL (ref 0.40–1.50)
GFR: 51.51 mL/min — ABNORMAL LOW (ref >60.00)
Glucose, Bld: 83 mg/dL (ref 70–99)
Potassium: 4.3 mEq/L (ref 3.5–5.1)
Sodium: 141 mEq/L (ref 135–145)

## 2020-09-20 NOTE — Progress Notes (Signed)
Patient ID: Brandon Rojas, male   DOB: 1945/09/22, 75 y.o.   MRN: [id number]        Chief Complaint: follow up recent lacunar infarct       HPI:  Brandon Rojas is a 75 y.o. 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 fib s/p DCCV on Eliquis #Hypertension  Pt presents to [facility] ED as code stroke in the setting of RUE and facial sensory changes with slurred speech. Initial head CT negative, CTA showed stenosis without occlusion. After CT completed pt returned to his baseline. Pt evaluated by Neurology, who recommended an MRI brain without contrast. 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 Neurology started the patient on ASA 81 mg daily and recommended a TTE with bubble study. TTE showed the following: LVEF 60 to [id number], grade 2 diastolic dysfunction. Moderate RV dilatation and mild reduction in function Severe biatrial enlargement. Moderate mitral regurgitation, moderate tricuspid regurgitation, mild pulmonary hypertension Mildly dilated but collapsible IVC, no pericardial disease. PT/OT evaluated the patient, and determined that he could follow-up with outpatient physical therapy. SLP evaluated the patient and recommended a regular diet with general precautions. Pt was continued on his Eliquis and statin therapy. Regarding patient's hypertension, his home regimen of lisinopril was increased to lisinopril 40 mg daily. Patient will have PCP f/u in a week, where we recommend a repeat BMP to ensure patient's potassium is within normal limits in the setting of the increased lisinopril dose. Patient was told to get a BP cuff at home and keep a BP diary. Etiology of his stroke is believed to be a lacunar  infarct in the setting of uncontrolled hypertension. Recommend patient follow-up with his primary cardiologist regarding his history of atrial fibrillation and echo findings of severe biatrial enlargement.  Today, pt with wife; Pt denies chest pain, increased sob or doe, wheezing, orthopnea, PND, increased LE swelling, palpitations, dizziness or syncope.   Pt denies polydipsia, polyuria, or new focal neuro s/s.   Pt denies fever, wt loss, night sweats, loss of appetite, or other constitutional symptoms  Wife states she changed his lisinopril to 20 bid for 40 qam due to low AM BP's.  Pt also new taking ASA 81 and continue on eliquis, denies overt bleeding.  No other new complaints  Wt Readings from Last 3 Encounters:  09/20/20 134 lb (60.8 kg)  09/06/20 136 lb 1.6 oz (61.7 kg)  08/11/20 135 lb (61.2 kg)   BP Readings from Last 3 Encounters:  09/20/20 130/72  09/06/20 (!) 156/80  08/11/20 120/70         Past Medical History:  Diagnosis Date   Anxiety state, unspecified    Arthritis    Benign neoplasm of colon    CAD (coronary artery disease) of artery bypass graft 05/20/2018   Depressive disorder, not elsewhere classified    Diaphragmatic hernia without mention of obstruction or gangrene    Diverticulosis of colon (without mention of hemorrhage)    Esophageal reflux    Esophageal stricture    Hiatal hernia    Mitral regurgitation    Osteoarthrosis, unspecified whether generalized or localized, unspecified site    Other and unspecified hyperlipidemia  Parkinson's disease ([place])    Personal history of colonic polyps    Stroke [facility])    Unspecified essential hypertension    Past Surgical History:  Procedure Laterality Date   CARDIOVERSION N/A 03/01/2020   Procedure: CARDIOVERSION;  Surgeon: Pixie Casino, MD;  Location: [place];  Service: Cardiovascular;  Laterality: N/A;   COLONOSCOPY     CORONARY ARTERY BYPASS GRAFT     x 2   INGUINAL HERNIA REPAIR     bilateral   IR  EXCHANGE BILIARY DRAIN  11/06/2018   IR PERC CHOLECYSTOSTOMY  05/22/2018   IR RADIOLOGIST EVAL & MGMT  06/24/2018   IR RADIOLOGIST EVAL & MGMT  07/01/2018   LUNG BIOPSY     POLYPECTOMY     PTCA     TEE WITHOUT CARDIOVERSION N/A 03/01/2020   Procedure: TRANSESOPHAGEAL ECHOCARDIOGRAM (TEE);  Surgeon: Pixie Casino, MD;  Location: [facility] ENDOSCOPY;  Service: Cardiovascular;  Laterality: N/A;    reports that he quit smoking about 48 years ago. His smoking use included cigarettes. He has never used smokeless tobacco. He reports that he does not drink alcohol and does not use drugs. family history includes Colon cancer in his sister; Fibromyalgia in his daughter; Healthy in his brother, brother, brother, daughter, and sister; Heart attack in his mother; Heart disease in his brother; Multiple sclerosis in his son; Stroke in his father. Allergies  Allergen Reactions   Atorvastatin Other (See Comments)    Muscle cramps   Simvastatin Other (See Comments)    Muscle cramps   Statins Hives    pts wife states its two statin medications he is allergic to   Current Outpatient Medications on File Prior to Visit  Medication Sig Dispense Refill   albuterol (VENTOLIN HFA) 108 (90 Base) MCG/ACT inhaler Inhale 1-2 puffs into the lungs every 6 (six) hours as needed for wheezing or shortness of breath. 18 g 4   apixaban (ELIQUIS) 5 MG TABS tablet TAKE 1 TABLET (5 MG TOTAL) BY MOUTH TWO TIMES DAILY. 180 tablet 3   aspirin 81 MG chewable tablet Chew 1 tablet by mouth daily.     B Complex Vitamins (VITAMIN B COMPLEX PO) Take 1 tablet by mouth as needed.     carbidopa-levodopa (SINEMET IR) 25-100 MG tablet 2 at 7 AM/1 at 11 AM/2 at 3 PM/1 tblet at 7 PM 540 tablet 2   esomeprazole (NEXIUM) 40 MG capsule Take 1 capsule (40 mg total) by mouth daily. 90 capsule 3   lisinopril (ZESTRIL) 40 MG tablet Take 0.5 tablets by mouth in the morning and at bedtime.     lovastatin (MEVACOR) 20 MG tablet TAKE 1 TABLET AT BEDTIME 90  tablet 1   Multiple Vitamin (MULTIVITAMIN WITH MINERALS) TABS tablet Take 1 tablet by mouth daily.     nitroGLYCERIN (NITROSTAT) 0.4 MG SL tablet PLACE 1 TABLET (0.4 MG TOTAL) UNDER THE TONGUE EVERY 5 (FIVE) MINUTES AS NEEDED. 50 tablet 3   Polyethyl Glycol-Propyl Glycol (SYSTANE ULTRA) 0.4-0.3 % SOLN Apply to eye as directed.     tiZANidine (ZANAFLEX) 2 MG tablet Take by mouth.     traMADol (ULTRAM) 50 MG tablet TAKE 1 TABLET BY MOUTH EVERY 6 HOURS AS NEEDED FOR PAIN 120 tablet 5   No current facility-administered medications on file prior to visit.        ROS:  All others reviewed and negative.  Objective        PE:  BP 130/72 (BP Location: Left Arm,  Patient Position: Sitting, Cuff Size: Normal)   Pulse (!) 50   Temp 98.2 F (36.8 C) (Oral)   Ht '5\' 4"'$  (1.626 m)   Wt 134 lb (60.8 kg)   SpO2 97%   BMI 23.00 kg/m                 Constitutional: Pt appears in NAD               HENT: Head: NCAT.                Right Ear: External ear normal.                 Left Ear: External ear normal.                Eyes: . Pupils are equal, round, and reactive to light. Conjunctivae and EOM are normal               Nose: without d/c or deformity               Neck: Neck supple. Gross normal ROM               Cardiovascular: Normal rate and regular rhythm.                 Pulmonary/Chest: Effort normal and breath sounds without rales or wheezing.                Abd:  Soft, NT, ND, + BS, no organomegaly               Neurological: Pt is alert. At baseline orientation, motor grossly intact               Skin: Skin is warm. No rashes, no other new lesions, LE edema - none               Psychiatric: Pt behavior is normal without agitation   Micro: none  Cardiac tracings I have personally interpreted today:  none  Pertinent Radiological findings (summarize): none   Lab Results  Component Value Date   WBC 6.1 04/11/2020   HGB 12.0 (L) 04/11/2020   HCT 37.3 (L) 04/11/2020   PLT 200.0  04/11/2020   GLUCOSE 83 09/20/2020   CHOL 113 04/11/2020   TRIG 118.0 04/11/2020   HDL 51.10 04/11/2020   LDLCALC 38 04/11/2020   ALT 4 04/11/2020   AST 19 04/11/2020   NA 141 09/20/2020   K 4.3 09/20/2020   CL 105 09/20/2020   CREATININE 1.34 09/20/2020   BUN 20 09/20/2020   CO2 28 09/20/2020   TSH 4.470 02/26/2020   PSA 1.23 09/04/2018   INR 2.2 (H) 03/01/2020   HGBA1C 5.6 04/11/2020   Assessment/Plan:  Brandon Rojas is a 75 y.o. White or Caucasian [1] male with  has a past medical history of Anxiety state, unspecified, Arthritis, Benign neoplasm of colon, CAD (coronary artery disease) of artery bypass graft (05/20/2018), Depressive disorder, not elsewhere classified, Diaphragmatic hernia without mention of obstruction or gangrene, Diverticulosis of colon (without mention of hemorrhage), Esophageal reflux, Esophageal stricture, Hiatal hernia, Mitral regurgitation, Osteoarthrosis, unspecified whether generalized or localized, unspecified site, Other and unspecified hyperlipidemia, Parkinson's disease ([place]), Personal history of colonic polyps, Stroke ([place]), and Unspecified essential hypertension.  Stroke, lacunar (HCC) Overall stable, cont current asa, statin, declines further f/u with neurology  Chronic anticoagulation Stable, also cont eliquis asd  Type 2 diabetes mellitus with complication, without long-term current use of  insulin ([place]) Lab Results  Component Value Date   HGBA1C 5.6 04/11/2020   Stable, pt to continue current medical treatment  - diet   Essential hypertension BP Readings from Last 3 Encounters:  09/20/20 130/72  09/06/20 (!) 156/80  08/11/20 120/70   Stable, pt to continue medical treatment lisinopril 20 bid, and recheck BMP  Followup: Return in about 3 months (around 12/20/2020).  [name], MD 09/20/2020 9:19 PM [facility]

## 2020-09-20 NOTE — Assessment & Plan Note (Signed)
Lab Results  Component Value Date   HGBA1C 5.6 04/11/2020   Stable, pt to continue current medical treatment  - diet

## 2020-09-20 NOTE — Assessment & Plan Note (Signed)
Overall stable, cont current asa, statin, declines further f/u with neurology

## 2020-09-20 NOTE — Assessment & Plan Note (Signed)
Stable, also cont eliquis asd

## 2020-09-20 NOTE — Patient Instructions (Signed)

## 2020-09-20 NOTE — Assessment & Plan Note (Signed)
BP Readings from Last 3 Encounters:  09/20/20 130/72  09/06/20 (!) 156/80  08/11/20 120/70   Stable, pt to continue medical treatment lisinopril 20 bid, and recheck BMP

## 2020-09-21 DIAGNOSIS — I69328 Other speech and language deficits following cerebral infarction: Secondary | ICD-10-CM | POA: Diagnosis not present

## 2020-09-21 DIAGNOSIS — I2510 Atherosclerotic heart disease of native coronary artery without angina pectoris: Secondary | ICD-10-CM | POA: Diagnosis not present

## 2020-09-21 DIAGNOSIS — I69354 Hemiplegia and hemiparesis following cerebral infarction affecting left non-dominant side: Secondary | ICD-10-CM | POA: Diagnosis not present

## 2020-09-21 DIAGNOSIS — I4891 Unspecified atrial fibrillation: Secondary | ICD-10-CM | POA: Diagnosis not present

## 2020-09-21 DIAGNOSIS — I5032 Chronic diastolic (congestive) heart failure: Secondary | ICD-10-CM | POA: Diagnosis not present

## 2020-09-21 DIAGNOSIS — I6529 Occlusion and stenosis of unspecified carotid artery: Secondary | ICD-10-CM | POA: Diagnosis not present

## 2020-09-21 DIAGNOSIS — I6502 Occlusion and stenosis of left vertebral artery: Secondary | ICD-10-CM | POA: Diagnosis not present

## 2020-09-21 DIAGNOSIS — I110 Hypertensive heart disease with heart failure: Secondary | ICD-10-CM | POA: Diagnosis not present

## 2020-09-21 DIAGNOSIS — E119 Type 2 diabetes mellitus without complications: Secondary | ICD-10-CM | POA: Diagnosis not present

## 2020-09-23 DIAGNOSIS — I110 Hypertensive heart disease with heart failure: Secondary | ICD-10-CM | POA: Diagnosis not present

## 2020-09-23 DIAGNOSIS — I6502 Occlusion and stenosis of left vertebral artery: Secondary | ICD-10-CM | POA: Diagnosis not present

## 2020-09-23 DIAGNOSIS — I2510 Atherosclerotic heart disease of native coronary artery without angina pectoris: Secondary | ICD-10-CM | POA: Diagnosis not present

## 2020-09-23 DIAGNOSIS — I5032 Chronic diastolic (congestive) heart failure: Secondary | ICD-10-CM | POA: Diagnosis not present

## 2020-09-23 DIAGNOSIS — I69328 Other speech and language deficits following cerebral infarction: Secondary | ICD-10-CM | POA: Diagnosis not present

## 2020-09-23 DIAGNOSIS — E119 Type 2 diabetes mellitus without complications: Secondary | ICD-10-CM | POA: Diagnosis not present

## 2020-09-23 DIAGNOSIS — I69354 Hemiplegia and hemiparesis following cerebral infarction affecting left non-dominant side: Secondary | ICD-10-CM | POA: Diagnosis not present

## 2020-09-23 DIAGNOSIS — I4891 Unspecified atrial fibrillation: Secondary | ICD-10-CM | POA: Diagnosis not present

## 2020-09-23 DIAGNOSIS — I6529 Occlusion and stenosis of unspecified carotid artery: Secondary | ICD-10-CM | POA: Diagnosis not present

## 2020-09-26 DIAGNOSIS — I5032 Chronic diastolic (congestive) heart failure: Secondary | ICD-10-CM | POA: Diagnosis not present

## 2020-09-26 DIAGNOSIS — I6529 Occlusion and stenosis of unspecified carotid artery: Secondary | ICD-10-CM | POA: Diagnosis not present

## 2020-09-26 DIAGNOSIS — I6502 Occlusion and stenosis of left vertebral artery: Secondary | ICD-10-CM | POA: Diagnosis not present

## 2020-09-26 DIAGNOSIS — I110 Hypertensive heart disease with heart failure: Secondary | ICD-10-CM | POA: Diagnosis not present

## 2020-09-26 DIAGNOSIS — I2510 Atherosclerotic heart disease of native coronary artery without angina pectoris: Secondary | ICD-10-CM | POA: Diagnosis not present

## 2020-09-26 DIAGNOSIS — I4891 Unspecified atrial fibrillation: Secondary | ICD-10-CM | POA: Diagnosis not present

## 2020-09-26 DIAGNOSIS — E119 Type 2 diabetes mellitus without complications: Secondary | ICD-10-CM | POA: Diagnosis not present

## 2020-09-26 DIAGNOSIS — I69354 Hemiplegia and hemiparesis following cerebral infarction affecting left non-dominant side: Secondary | ICD-10-CM | POA: Diagnosis not present

## 2020-09-26 DIAGNOSIS — I69328 Other speech and language deficits following cerebral infarction: Secondary | ICD-10-CM | POA: Diagnosis not present

## 2020-09-29 DIAGNOSIS — I110 Hypertensive heart disease with heart failure: Secondary | ICD-10-CM | POA: Diagnosis not present

## 2020-09-29 DIAGNOSIS — I4891 Unspecified atrial fibrillation: Secondary | ICD-10-CM | POA: Diagnosis not present

## 2020-09-29 DIAGNOSIS — I6502 Occlusion and stenosis of left vertebral artery: Secondary | ICD-10-CM | POA: Diagnosis not present

## 2020-09-29 DIAGNOSIS — E119 Type 2 diabetes mellitus without complications: Secondary | ICD-10-CM | POA: Diagnosis not present

## 2020-09-29 DIAGNOSIS — I2510 Atherosclerotic heart disease of native coronary artery without angina pectoris: Secondary | ICD-10-CM | POA: Diagnosis not present

## 2020-09-29 DIAGNOSIS — I6529 Occlusion and stenosis of unspecified carotid artery: Secondary | ICD-10-CM | POA: Diagnosis not present

## 2020-09-29 DIAGNOSIS — I5032 Chronic diastolic (congestive) heart failure: Secondary | ICD-10-CM | POA: Diagnosis not present

## 2020-09-29 DIAGNOSIS — I69354 Hemiplegia and hemiparesis following cerebral infarction affecting left non-dominant side: Secondary | ICD-10-CM | POA: Diagnosis not present

## 2020-09-29 DIAGNOSIS — I69328 Other speech and language deficits following cerebral infarction: Secondary | ICD-10-CM | POA: Diagnosis not present

## 2020-10-03 ENCOUNTER — Encounter: Payer: Self-pay | Admitting: Internal Medicine

## 2020-10-03 DIAGNOSIS — I6529 Occlusion and stenosis of unspecified carotid artery: Secondary | ICD-10-CM | POA: Diagnosis not present

## 2020-10-03 DIAGNOSIS — I69328 Other speech and language deficits following cerebral infarction: Secondary | ICD-10-CM | POA: Diagnosis not present

## 2020-10-03 DIAGNOSIS — I69354 Hemiplegia and hemiparesis following cerebral infarction affecting left non-dominant side: Secondary | ICD-10-CM | POA: Diagnosis not present

## 2020-10-03 DIAGNOSIS — I6502 Occlusion and stenosis of left vertebral artery: Secondary | ICD-10-CM | POA: Diagnosis not present

## 2020-10-03 DIAGNOSIS — I5032 Chronic diastolic (congestive) heart failure: Secondary | ICD-10-CM | POA: Diagnosis not present

## 2020-10-03 DIAGNOSIS — I4891 Unspecified atrial fibrillation: Secondary | ICD-10-CM | POA: Diagnosis not present

## 2020-10-03 DIAGNOSIS — E119 Type 2 diabetes mellitus without complications: Secondary | ICD-10-CM | POA: Diagnosis not present

## 2020-10-03 DIAGNOSIS — I110 Hypertensive heart disease with heart failure: Secondary | ICD-10-CM | POA: Diagnosis not present

## 2020-10-03 DIAGNOSIS — I2510 Atherosclerotic heart disease of native coronary artery without angina pectoris: Secondary | ICD-10-CM | POA: Diagnosis not present

## 2020-10-05 DIAGNOSIS — I4891 Unspecified atrial fibrillation: Secondary | ICD-10-CM | POA: Diagnosis not present

## 2020-10-05 DIAGNOSIS — I69354 Hemiplegia and hemiparesis following cerebral infarction affecting left non-dominant side: Secondary | ICD-10-CM | POA: Diagnosis not present

## 2020-10-05 DIAGNOSIS — I6502 Occlusion and stenosis of left vertebral artery: Secondary | ICD-10-CM | POA: Diagnosis not present

## 2020-10-05 DIAGNOSIS — I5032 Chronic diastolic (congestive) heart failure: Secondary | ICD-10-CM | POA: Diagnosis not present

## 2020-10-05 DIAGNOSIS — E119 Type 2 diabetes mellitus without complications: Secondary | ICD-10-CM | POA: Diagnosis not present

## 2020-10-05 DIAGNOSIS — I2510 Atherosclerotic heart disease of native coronary artery without angina pectoris: Secondary | ICD-10-CM | POA: Diagnosis not present

## 2020-10-05 DIAGNOSIS — I6529 Occlusion and stenosis of unspecified carotid artery: Secondary | ICD-10-CM | POA: Diagnosis not present

## 2020-10-05 DIAGNOSIS — I110 Hypertensive heart disease with heart failure: Secondary | ICD-10-CM | POA: Diagnosis not present

## 2020-10-05 DIAGNOSIS — I69328 Other speech and language deficits following cerebral infarction: Secondary | ICD-10-CM | POA: Diagnosis not present

## 2020-10-07 ENCOUNTER — Ambulatory Visit: Payer: Medicare HMO | Admitting: Medical

## 2020-10-09 ENCOUNTER — Encounter: Payer: Self-pay | Admitting: Internal Medicine

## 2020-10-10 DIAGNOSIS — I5032 Chronic diastolic (congestive) heart failure: Secondary | ICD-10-CM | POA: Diagnosis not present

## 2020-10-10 DIAGNOSIS — I4891 Unspecified atrial fibrillation: Secondary | ICD-10-CM | POA: Diagnosis not present

## 2020-10-10 DIAGNOSIS — I69354 Hemiplegia and hemiparesis following cerebral infarction affecting left non-dominant side: Secondary | ICD-10-CM | POA: Diagnosis not present

## 2020-10-10 DIAGNOSIS — I2510 Atherosclerotic heart disease of native coronary artery without angina pectoris: Secondary | ICD-10-CM | POA: Diagnosis not present

## 2020-10-10 DIAGNOSIS — I69328 Other speech and language deficits following cerebral infarction: Secondary | ICD-10-CM | POA: Diagnosis not present

## 2020-10-10 DIAGNOSIS — E119 Type 2 diabetes mellitus without complications: Secondary | ICD-10-CM | POA: Diagnosis not present

## 2020-10-10 DIAGNOSIS — I6529 Occlusion and stenosis of unspecified carotid artery: Secondary | ICD-10-CM | POA: Diagnosis not present

## 2020-10-10 DIAGNOSIS — I6502 Occlusion and stenosis of left vertebral artery: Secondary | ICD-10-CM | POA: Diagnosis not present

## 2020-10-10 DIAGNOSIS — I110 Hypertensive heart disease with heart failure: Secondary | ICD-10-CM | POA: Diagnosis not present

## 2020-10-15 ENCOUNTER — Encounter: Payer: Self-pay | Admitting: Internal Medicine

## 2020-10-19 ENCOUNTER — Encounter: Payer: Self-pay | Admitting: Internal Medicine

## 2020-10-19 DIAGNOSIS — I5032 Chronic diastolic (congestive) heart failure: Secondary | ICD-10-CM | POA: Diagnosis not present

## 2020-10-19 DIAGNOSIS — I6502 Occlusion and stenosis of left vertebral artery: Secondary | ICD-10-CM | POA: Diagnosis not present

## 2020-10-19 DIAGNOSIS — I6529 Occlusion and stenosis of unspecified carotid artery: Secondary | ICD-10-CM | POA: Diagnosis not present

## 2020-10-19 DIAGNOSIS — I69328 Other speech and language deficits following cerebral infarction: Secondary | ICD-10-CM | POA: Diagnosis not present

## 2020-10-19 DIAGNOSIS — I110 Hypertensive heart disease with heart failure: Secondary | ICD-10-CM | POA: Diagnosis not present

## 2020-10-19 DIAGNOSIS — I4891 Unspecified atrial fibrillation: Secondary | ICD-10-CM | POA: Diagnosis not present

## 2020-10-19 DIAGNOSIS — I2510 Atherosclerotic heart disease of native coronary artery without angina pectoris: Secondary | ICD-10-CM | POA: Diagnosis not present

## 2020-10-19 DIAGNOSIS — I69354 Hemiplegia and hemiparesis following cerebral infarction affecting left non-dominant side: Secondary | ICD-10-CM | POA: Diagnosis not present

## 2020-10-19 DIAGNOSIS — E119 Type 2 diabetes mellitus without complications: Secondary | ICD-10-CM | POA: Diagnosis not present

## 2020-10-24 DIAGNOSIS — I6529 Occlusion and stenosis of unspecified carotid artery: Secondary | ICD-10-CM | POA: Diagnosis not present

## 2020-10-24 DIAGNOSIS — I2510 Atherosclerotic heart disease of native coronary artery without angina pectoris: Secondary | ICD-10-CM | POA: Diagnosis not present

## 2020-10-24 DIAGNOSIS — I5032 Chronic diastolic (congestive) heart failure: Secondary | ICD-10-CM | POA: Diagnosis not present

## 2020-10-24 DIAGNOSIS — E119 Type 2 diabetes mellitus without complications: Secondary | ICD-10-CM | POA: Diagnosis not present

## 2020-10-24 DIAGNOSIS — I6502 Occlusion and stenosis of left vertebral artery: Secondary | ICD-10-CM | POA: Diagnosis not present

## 2020-10-24 DIAGNOSIS — I69328 Other speech and language deficits following cerebral infarction: Secondary | ICD-10-CM | POA: Diagnosis not present

## 2020-10-24 DIAGNOSIS — I4891 Unspecified atrial fibrillation: Secondary | ICD-10-CM | POA: Diagnosis not present

## 2020-10-24 DIAGNOSIS — I110 Hypertensive heart disease with heart failure: Secondary | ICD-10-CM | POA: Diagnosis not present

## 2020-10-24 DIAGNOSIS — I69354 Hemiplegia and hemiparesis following cerebral infarction affecting left non-dominant side: Secondary | ICD-10-CM | POA: Diagnosis not present

## 2020-10-25 ENCOUNTER — Other Ambulatory Visit: Payer: Self-pay | Admitting: Internal Medicine

## 2020-10-26 ENCOUNTER — Encounter: Payer: Self-pay | Admitting: Internal Medicine

## 2020-10-26 ENCOUNTER — Other Ambulatory Visit: Payer: Self-pay

## 2020-10-26 ENCOUNTER — Other Ambulatory Visit (HOSPITAL_COMMUNITY): Payer: Self-pay

## 2020-10-26 ENCOUNTER — Ambulatory Visit (INDEPENDENT_AMBULATORY_CARE_PROVIDER_SITE_OTHER): Payer: Medicare HMO | Admitting: Internal Medicine

## 2020-10-26 VITALS — BP 102/60 | HR 57 | Temp 98.0°F | Ht 64.0 in | Wt 135.0 lb

## 2020-10-26 DIAGNOSIS — R29810 Facial weakness: Secondary | ICD-10-CM | POA: Diagnosis not present

## 2020-10-26 DIAGNOSIS — R001 Bradycardia, unspecified: Secondary | ICD-10-CM | POA: Diagnosis not present

## 2020-10-26 DIAGNOSIS — R062 Wheezing: Secondary | ICD-10-CM | POA: Diagnosis not present

## 2020-10-26 DIAGNOSIS — I10 Essential (primary) hypertension: Secondary | ICD-10-CM

## 2020-10-26 MED ORDER — PREDNISONE 10 MG PO TABS
ORAL_TABLET | ORAL | 0 refills | Status: DC
  Filled 2020-10-26: qty 7, 7d supply, fill #0

## 2020-10-26 MED ORDER — PREDNISONE 10 MG PO TABS: ORAL_TABLET | ORAL | 0 refills | Status: DC

## 2020-10-26 NOTE — Progress Notes (Signed)
Patient ID: Brandon Rojas, male   DOB: Nov 22, 1945, 75 y.o.   MRN: [id number]

## 2020-10-26 NOTE — Assessment & Plan Note (Signed)
Though labile, I suspect overall mild overcontrolled, and to decrease the lisinopril to 20 qd only (from 20 bid)

## 2020-10-26 NOTE — Assessment & Plan Note (Signed)
Mild intermittent per wife home record of BP and HR checks, apparently asyptomatic, pt and wife encouraged to address with cardiology as well nov 23, or sooner if needed

## 2020-10-26 NOTE — Assessment & Plan Note (Signed)
Mild, for low dose short course prednisone, cont in haler prn,  to f/u any worsening symptoms or concerns

## 2020-10-26 NOTE — Progress Notes (Signed)
Patient ID: Brandon Rojas, male   DOB: 09/25/1945, 75 y.o.   MRN: [id number]        Chief Complaint: follow up left sided face weakness, low HR, lower BP, and wheezing       HPI:  Brandon Rojas is a 75 y.o. male here with several concerns, the first is 6 days acute onset left sided facial weakness with overt weakness of the lower face and somewhat drooping left upper eyelid, slurring speech, and new drooling at night.  Denies HA, vision change or any other new focal neuro symptoms.  Wife noticed and pt not overly concerned it seems.  Wife very diligent on checkin gBP and HR with almost daily checks, with SBP somewhat labile to the lower 100's to 140's, but also noticed occasional HR in the 40s.  Pt denies chest pain, increased sob or doe, orthopnea, PND, increased LE swelling, palpitations, dizziness or syncope, athtough he has had some seasonal mild wheezing onset in the past wk, similar to asthma exacerbation about 1 yr ago resulting in his uncontrolled afib.  Currently not taking negative chronotrope meds  .  No recent falls - last fall was July 29.  Pt has cardiology f/u nov 23.   Pt denies polydipsia, polyuria, or new focal neuro s/s.   Pt denies fever, night sweats, loss of appetite, or other constitutional symptoms, and wt seems overall about the same.       Wt Readings from Last 3 Encounters:  10/26/20 135 lb (61.2 kg)  09/20/20 134 lb (60.8 kg)  09/06/20 136 lb 1.6 oz (61.7 kg)   BP Readings from Last 3 Encounters:  10/26/20 102/60  09/20/20 130/72  09/06/20 (!) 156/80         Past Medical History:  Diagnosis Date   Anxiety state, unspecified    Arthritis    Benign neoplasm of colon    CAD (coronary artery disease) of artery bypass graft 05/20/2018   Depressive disorder, not elsewhere classified    Diaphragmatic hernia without mention of obstruction or gangrene    Diverticulosis of colon (without mention of hemorrhage)    Esophageal reflux    Esophageal stricture    Hiatal hernia     Mitral regurgitation    Osteoarthrosis, unspecified whether generalized or localized, unspecified site    Other and unspecified hyperlipidemia    Parkinson's disease ([place])    Personal history of colonic polyps    Stroke ([place])    Unspecified essential hypertension    Past Surgical History:  Procedure Laterality Date   CARDIOVERSION N/A 03/01/2020   Procedure: CARDIOVERSION;  Surgeon: Pixie Casino, MD;  Location: [place];  Service: Cardiovascular;  Laterality: N/A;   COLONOSCOPY     CORONARY ARTERY BYPASS GRAFT     x 2   INGUINAL HERNIA REPAIR     bilateral   IR EXCHANGE BILIARY DRAIN  11/06/2018   IR PERC CHOLECYSTOSTOMY  05/22/2018   IR RADIOLOGIST EVAL & MGMT  06/24/2018   IR RADIOLOGIST EVAL & MGMT  07/01/2018   LUNG BIOPSY     POLYPECTOMY     PTCA     TEE WITHOUT CARDIOVERSION N/A 03/01/2020   Procedure: TRANSESOPHAGEAL ECHOCARDIOGRAM (TEE);  Surgeon: Pixie Casino, MD;  Location: [facility] ENDOSCOPY;  Service: Cardiovascular;  Laterality: N/A;    reports that he quit smoking about 48 years ago. His smoking use included cigarettes. He has never used smokeless tobacco. He reports that he does not drink alcohol and does  not use drugs. family history includes Colon cancer in his sister; Fibromyalgia in his daughter; Healthy in his brother, brother, brother, daughter, and sister; Heart attack in his mother; Heart disease in his brother; Multiple sclerosis in his son; Stroke in his father. Allergies  Allergen Reactions   Atorvastatin Other (See Comments)    Muscle cramps   Simvastatin Other (See Comments)    Muscle cramps   Statins Hives    pts wife states its two statin medications he is allergic to   Current Outpatient Medications on File Prior to Visit  Medication Sig Dispense Refill   albuterol (VENTOLIN HFA) 108 (90 Base) MCG/ACT inhaler Inhale 1-2 puffs into the lungs every 6 (six) hours as needed for wheezing or shortness of breath. 18 g 4   apixaban (ELIQUIS) 5 MG  TABS tablet TAKE 1 TABLET (5 MG TOTAL) BY MOUTH TWO TIMES DAILY. 180 tablet 3   aspirin 81 MG chewable tablet Chew 1 tablet by mouth daily.     B Complex Vitamins (VITAMIN B COMPLEX PO) Take 1 tablet by mouth as needed.     carbidopa-levodopa (SINEMET IR) 25-100 MG tablet 2 at 7 AM/1 at 11 AM/2 at 3 PM/1 tblet at 7 PM 540 tablet 2   esomeprazole (NEXIUM) 40 MG capsule Take 1 capsule (40 mg total) by mouth daily. 90 capsule 3   lisinopril (ZESTRIL) 40 MG tablet Take 0.5 tablets by mouth daily.     lovastatin (MEVACOR) 20 MG tablet TAKE 1 TABLET AT BEDTIME 90 tablet 1   Multiple Vitamin (MULTIVITAMIN WITH MINERALS) TABS tablet Take 1 tablet by mouth daily.     nitroGLYCERIN (NITROSTAT) 0.4 MG SL tablet PLACE 1 TABLET (0.4 MG TOTAL) UNDER THE TONGUE EVERY 5 (FIVE) MINUTES AS NEEDED. 50 tablet 3   Polyethyl Glycol-Propyl Glycol (SYSTANE ULTRA) 0.4-0.3 % SOLN Apply to eye as directed.     tiZANidine (ZANAFLEX) 2 MG tablet Take by mouth.     traMADol (ULTRAM) 50 MG tablet TAKE 1 TABLET BY MOUTH EVERY 6 HOURS AS NEEDED FOR PAIN 120 tablet 5   No current facility-administered medications on file prior to visit.        ROS:  All others reviewed and negative.  Objective        PE:  BP 102/60 (BP Location: Right Arm, Patient Position: Sitting, Cuff Size: Normal)   Pulse (!) 57   Temp 98 F (36.7 C) (Oral)   Ht 5\' 4"  (1.626 m)   Wt 135 lb (61.2 kg)   SpO2 96%   BMI 23.17 kg/m                 Constitutional: Pt appears in NAD               HENT: Head: NCAT.                Right Ear: External ear normal.                 Left Ear: External ear normal.                Eyes: . Pupils are equal, round, and reactive to light. Conjunctivae and EOM are normal               Nose: without d/c or deformity               Neck: Neck supple. Gross normal ROM  Cardiovascular: Normal rate and regular rhythm.                 Pulmonary/Chest: Effort normal and breath sounds without rales or  wheezing.                Abd:  Soft, NT, ND, + BS, no organomegaly               Neurological: Pt is alert. At baseline orientation, motor grossly intact and CN c/w left 5th nerve weakness with drooping left upper eyelid and left nasolabial straightening on smile.                 Skin: Skin is warm. No rashes, no other new lesions, LE edema - none               Psychiatric: Pt behavior is normal without agitation   Micro: none  Cardiac tracings I have personally interpreted today:  none  Pertinent Radiological findings (summarize): none   Lab Results  Component Value Date   WBC 6.1 04/11/2020   HGB 12.0 (L) 04/11/2020   HCT 37.3 (L) 04/11/2020   PLT 200.0 04/11/2020   GLUCOSE 83 09/20/2020   CHOL 113 04/11/2020   TRIG 118.0 04/11/2020   HDL 51.10 04/11/2020   LDLCALC 38 04/11/2020   ALT 4 04/11/2020   AST 19 04/11/2020   NA 141 09/20/2020   K 4.3 09/20/2020   CL 105 09/20/2020   CREATININE 1.34 09/20/2020   BUN 20 09/20/2020   CO2 28 09/20/2020   TSH 4.470 02/26/2020   PSA 1.23 09/04/2018   INR 2.2 (H) 03/01/2020   HGBA1C 5.6 04/11/2020   Assessment/Plan:  Brandon Rojas is a 75 y.o. White or Caucasian [1] male with  has a past medical history of Anxiety state, unspecified, Arthritis, Benign neoplasm of colon, CAD (coronary artery disease) of artery bypass graft (05/20/2018), Depressive disorder, not elsewhere classified, Diaphragmatic hernia without mention of obstruction or gangrene, Diverticulosis of colon (without mention of hemorrhage), Esophageal reflux, Esophageal stricture, Hiatal hernia, Mitral regurgitation, Osteoarthrosis, unspecified whether generalized or localized, unspecified site, Other and unspecified hyperlipidemia, Parkinson's disease ([place]), Personal history of colonic polyps, Stroke ([place]), and Unspecified essential hypertension.  Weakness on left side of face This is c/w bells palsy vs acute cva, refuses ED evalaution, to continue current med tx asa 81 and  lovastatin 40 after recent TIA, and check MRI head, consider neurology referral  Bradycardia Mild intermittent per wife home record of BP and HR checks, apparently asyptomatic, pt and wife encouraged to address with cardiology as well nov 23, or sooner if needed  Essential hypertension Though labile, I suspect overall mild overcontrolled, and to decrease the lisinopril to 20 qd only (from 20 bid)  Wheezing Mild, for low dose short course prednisone, cont in haler prn,  to f/u any worsening symptoms or concerns  Followup: Return if symptoms worsen or fail to improve.  [name], MD 10/26/2020 10:55 PM [facility]

## 2020-10-26 NOTE — Patient Instructions (Signed)
Ok to decrease the lisinopril to 20 mg in the AM only  Please take all new medication as prescribed - the prednisone  Please continue all other medications as before, including the inhaler  Please have the pharmacy call with any other refills you may need.  Please continue your efforts at being more active, low cholesterol diet, and weight control.  Please keep your appointments with your specialists as you may have planned  You will be contacted regarding the referral for: MRI

## 2020-10-26 NOTE — Assessment & Plan Note (Signed)
This is c/w bells palsy vs acute cva, refuses ED evalaution, to continue current med tx asa 81 and lovastatin 40 after recent TIA, and check MRI head, consider neurology referral

## 2020-10-28 ENCOUNTER — Other Ambulatory Visit: Payer: Self-pay

## 2020-10-28 ENCOUNTER — Ambulatory Visit
Admission: RE | Admit: 2020-10-28 | Discharge: 2020-10-28 | Disposition: A | Discharge status: Home / Self Care | Payer: Medicare HMO | Source: Ambulatory Visit | Attending: Internal Medicine | Admitting: Internal Medicine

## 2020-10-28 DIAGNOSIS — R29810 Facial weakness: Secondary | ICD-10-CM

## 2020-10-28 DIAGNOSIS — I639 Cerebral infarction, unspecified: Secondary | ICD-10-CM | POA: Diagnosis not present

## 2020-10-28 DIAGNOSIS — I6782 Cerebral ischemia: Secondary | ICD-10-CM | POA: Diagnosis not present

## 2020-10-30 ENCOUNTER — Encounter: Payer: Self-pay | Admitting: Internal Medicine

## 2020-10-30 ENCOUNTER — Other Ambulatory Visit: Payer: Self-pay | Admitting: Internal Medicine

## 2020-10-30 DIAGNOSIS — I639 Cerebral infarction, unspecified: Secondary | ICD-10-CM

## 2020-11-03 DIAGNOSIS — I69354 Hemiplegia and hemiparesis following cerebral infarction affecting left non-dominant side: Secondary | ICD-10-CM | POA: Diagnosis not present

## 2020-11-03 DIAGNOSIS — I4891 Unspecified atrial fibrillation: Secondary | ICD-10-CM | POA: Diagnosis not present

## 2020-11-03 DIAGNOSIS — I5032 Chronic diastolic (congestive) heart failure: Secondary | ICD-10-CM | POA: Diagnosis not present

## 2020-11-03 DIAGNOSIS — I69328 Other speech and language deficits following cerebral infarction: Secondary | ICD-10-CM | POA: Diagnosis not present

## 2020-11-03 DIAGNOSIS — I6502 Occlusion and stenosis of left vertebral artery: Secondary | ICD-10-CM | POA: Diagnosis not present

## 2020-11-03 DIAGNOSIS — I2510 Atherosclerotic heart disease of native coronary artery without angina pectoris: Secondary | ICD-10-CM | POA: Diagnosis not present

## 2020-11-03 DIAGNOSIS — E119 Type 2 diabetes mellitus without complications: Secondary | ICD-10-CM | POA: Diagnosis not present

## 2020-11-03 DIAGNOSIS — I6529 Occlusion and stenosis of unspecified carotid artery: Secondary | ICD-10-CM | POA: Diagnosis not present

## 2020-11-03 DIAGNOSIS — I110 Hypertensive heart disease with heart failure: Secondary | ICD-10-CM | POA: Diagnosis not present

## 2020-11-04 ENCOUNTER — Encounter: Payer: Self-pay | Admitting: Internal Medicine

## 2020-11-15 ENCOUNTER — Telehealth: Payer: Self-pay | Admitting: Internal Medicine

## 2020-11-15 NOTE — Telephone Encounter (Signed)
LVM for pt to rtn my call to schedule AWV with [facility]. Please schedule this appt if pt calls the office.  °

## 2020-11-19 ENCOUNTER — Encounter: Payer: Self-pay | Admitting: Internal Medicine

## 2020-11-21 ENCOUNTER — Telehealth: Payer: Self-pay

## 2020-11-21 NOTE — Telephone Encounter (Signed)
Was able to contact patients wife and she gave me the following information Sx's included He has had the following happen in Sept Oct now in Nov patient had MRI in Sept and Oct after those occurences and they showed the stroke patients wife said   He usually takes naps for around an hour and half on the days that he has had these happen he sleeps for 3-4 hours Non responsive Slurred speech  Staring with blank stare  No weakness or numbness that she is aware of Patient taking Eliquis 2x a day  Paramedics told wife there were no beds at the hospital and he would;d have to stay in the Ed possibly a day or two before being able to get a bed. Patient did not want to have to stay in the ED

## 2020-11-21 NOTE — Telephone Encounter (Signed)
Called patients wife back and they had already reached out to Dr. Judi Cong the PCP and he has suggested they see a stroke specialist at [facility] Dr. Rolm Gala they have an appointment scheduled for Wednesday and I told them to let us know what they would like to do after meeting with Dr. Leonie Man

## 2020-11-22 ENCOUNTER — Other Ambulatory Visit: Payer: Self-pay

## 2020-11-22 ENCOUNTER — Ambulatory Visit: Payer: Medicare HMO | Admitting: Neurology

## 2020-11-22 VITALS — BP 161/70 | HR 50 | Ht 64.0 in | Wt 135.0 lb

## 2020-11-22 DIAGNOSIS — R569 Unspecified convulsions: Secondary | ICD-10-CM | POA: Diagnosis not present

## 2020-11-22 DIAGNOSIS — Z8669 Personal history of other diseases of the nervous system and sense organs: Secondary | ICD-10-CM

## 2020-11-22 DIAGNOSIS — G40009 Localization-related (focal) (partial) idiopathic epilepsy and epileptic syndromes with seizures of localized onset, not intractable, without status epilepticus: Secondary | ICD-10-CM | POA: Diagnosis not present

## 2020-11-22 DIAGNOSIS — I6381 Other cerebral infarction due to occlusion or stenosis of small artery: Secondary | ICD-10-CM | POA: Diagnosis not present

## 2020-11-22 MED ORDER — LEVETIRACETAM ER 500 MG PO TB24: 500.0000 mg | ORAL_TABLET | Freq: Every day | ORAL | 3 refills | Status: DC

## 2020-11-22 NOTE — Patient Instructions (Signed)
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  I recommend checking EEG for seizure activity and trial of Keppra XR 500 mg daily for seizure prevention.  I also advised him to avoid seizure provoking stimuli like sleep deprivation, irregular eating and sleeping habits and abrupt discontinuation of medications.  Check screening carotid ultrasound and transcranial Doppler studies.  Continue Eliquis for stroke prevention given his recent diagnosis of atrial fibrillation as well as aggressive risk factor modification with strict blood pressure control below 130/90, lipids with LDL cholesterol goal below 70 mg percent and diabetes with hemoglobin A1c goal below 6.5%.  He was also advised to follow-up for his Parkinson's disease with Dr. Carles Collet.  He will return for follow-up with me on an as practitioner in 3 months or call earlier if necessary.

## 2020-11-22 NOTE — Progress Notes (Signed)
[facility] [address] [place]. [place] [address] [phone]       OFFICE CONSULT NOTE  Mr. Brandon Rojas Date of Birth:  03/26/1945 Medical Record Number:  [id number]   Referring MD: Wells Guiles Tat  Reason for Referral: Stroke second opinion HPI: Initial visit 05/28/2019 Mr. [name] is a pleasant 75 year old Caucasian male seen today for follow-up second opinion consult for stroke upon request from Dr. Carles Collet.  He is accompanied by his wife.  History is obtained from them, review of electronic medical records and I personally reviewed imaging films in PACS.  He has past medical history of Parkinson's disease, coronary artery disease, osteoarthritis.  He states that on 02/26/2019 he woke up from sleep and noticed his left leg was weak.  He has more trouble walking beyond his baseline and needed help using a walker as well as gait belt is felt unsteady.  His symptoms gradually improve over the next few days and he has been getting some home physical therapy which seems to have helped.  He feels now is walking and leg strength have returned back to baseline.  He had an MRI scan of the brain done on 04/09/2019 at [facility] which showed a small right frontal lacunar infarct in the subcortical coronary radiator.  Remote age bilateral thalamic lacunar infarcts are also noted.  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  Lab work on 03/09/2019 showed LDL cholesterol to be optimal at 56 mg percent.  Transthoracic echo on 08/27/2018 showed normal ejection fraction.  Patient has not had any recent evaluation of his extracranial vessels.  He has been on aspirin and Plavix long-term due to having coronary artery disease and cardiac  stent.  Is tolerating these medications well without bruising or bleeding.  He has had no recent falls.  Is tolerating Mevacor well without muscle aches and pains.  His Parkinson symptoms seem quite well controlled on the current medication regimen of Sinemet 25/100 mg  tablet 4 times a day.  He has no new complaints today.  He has a pending cataract surgery for later this month and is asking for neurological clearance.  He and his wife assure me that he does not have to stop aspirin or Plavix for the surgery.  He denies any other prior history of strokes ,TIAs or seizures. Update 11/22/2020 : Patient is referred back by Dr. [name] for evaluation following recent abnormal MRI scan on 10/28/2020 showing left frontal subcortical lacunar infarct.  Patient states that a week before he had an episode where he was not talking and not acting right.  He was staring and speech was garbled.  His symptoms lasted for few hours and then recovered in less than a day.  He did call his primary physician subsequently ordered outpatient MRI which was done a week later and I personally reviewed and showed a tiny left frontal periventricular subcortical lacunar stroke.  Old right thalamic lacunar stroke was also seen.  Patient's wife states that last Saturday he had somewhat similar episode where he was sitting and staring and not following commands with a blank look.  Lasted about 10 to 15 minutes and recovered.  On both occasions she had noted that the patient had been  sleepy of the day before and slept 3 to 4 hours during the daytime.  Patient had also complained of some chest discomfort before these episodes.  He did not break out into a sweat and vital signs were not recorded.  Patient was also seen in [facility] where he was hospitalized in September 10, 2020 with a small  infarct in the right cingulate gyrus on the right.  Echocardiogram was unremarkable.  Neurovascular studies were not done.  Last carotid  ultrasound was on 06/15/2020 and was unremarkable.  LDL cholesterol was 65 and A1c 5.4 on 09/10/2020.  Patient was diagnosed with new onset atrial fibrillation in February 22 and has been started on Eliquis.  He is also on aspirin 81 mg.  Is tolerating them well with only minor bruising and no bleeding episodes.  He continues to use a walker for his Parkinson's disease and ambulates slowly but reasonably well.  He remains on Sinemet 25/100 mg 2 tablets at 7 AM, 1 tablet at 11 AM, 2 tablets at 3 PM and 1 at 7 PM and feels his parkinsonian symptoms are well controlled.  He does follow-up with Dr. Carles Collet for the same .  Patient has not had any EEG done or any prior history of seizures significant head injury with loss of consciousness. ROS:   14 system review of systems is positive for leg weakness, walking difficulty, imbalance , staring, speech difficulty, altered awareness and unsteady gait all other systems negative  PMH:  Past Medical History:  Diagnosis Date   Anxiety state, unspecified    Arthritis    Benign neoplasm of colon    CAD (coronary artery disease) of artery bypass graft 05/20/2018   Depressive disorder, not elsewhere classified    Diaphragmatic hernia without mention of obstruction or gangrene    Diverticulosis of colon (without mention of hemorrhage)    Esophageal reflux    Esophageal stricture    Hiatal hernia    Mitral regurgitation    Osteoarthrosis, unspecified whether generalized or localized, unspecified site    Other and unspecified hyperlipidemia    Parkinson's disease ([place])    Personal history of colonic polyps    Stroke ([place])    Unspecified essential hypertension     Social History:  Social History   Socioeconomic History   Marital status: Married    Spouse name: Not on file   Number of children: 3   Years of education: Not on file   Highest education level: Doctorate  Occupational History   Occupation: Mining engineer: [employer]  Tobacco  Use   Smoking status: Former    Types: Cigarettes    Quit date: 02/19/1972    Years since quitting: 48.7   Smokeless tobacco: Never  Vaping Use   Vaping Use: Never used  Substance and Sexual Activity   Alcohol use: No    Alcohol/week: 0.0 standard drinks   Drug use: No   Sexual activity: Not on file  Other Topics Concern   Not on file  Social History Narrative   Right handed    Lives in one story home with a basement that he does not use.   Social Determinants of Health   Financial Resource Strain: Not on file  Food Insecurity: Not on file  Transportation Needs: Not on file  Physical Activity: Not on file  Stress: Not on file  Social Connections: Not on file  Intimate Partner Violence: Not on file    Medications:  Current Outpatient Medications on File Prior to Visit  Medication Sig Dispense Refill   albuterol (VENTOLIN HFA) 108 (90 Base) MCG/ACT inhaler Inhale 1-2 puffs into the lungs every 6 (six) hours as needed for wheezing or shortness of breath. 18 g 4   apixaban (ELIQUIS) 5 MG TABS tablet TAKE 1 TABLET (5 MG TOTAL) BY MOUTH TWO TIMES DAILY. 180 tablet 3   aspirin 81 MG chewable tablet Chew 1 tablet by mouth daily.     B Complex Vitamins (VITAMIN B COMPLEX PO) Take 1 tablet by mouth as needed.     carbidopa-levodopa (SINEMET IR) 25-100 MG tablet 2 at 7 AM/1 at 11 AM/2 at 3 PM/1 tblet at 7 PM 540 tablet 2   esomeprazole (NEXIUM) 40 MG capsule Take 1 capsule (40 mg total) by mouth daily. 90 capsule 3   lisinopril (ZESTRIL) 40 MG tablet Take 0.5 tablets by mouth daily.     lovastatin (MEVACOR) 20 MG tablet TAKE 1 TABLET AT BEDTIME 90 tablet 1   Multiple Vitamin (MULTIVITAMIN WITH MINERALS) TABS tablet Take 1 tablet by mouth daily.     nitroGLYCERIN (NITROSTAT) 0.4 MG SL tablet PLACE 1 TABLET (0.4 MG TOTAL) UNDER THE TONGUE EVERY 5 (FIVE) MINUTES AS NEEDED. 50 tablet 3   Polyethyl Glycol-Propyl Glycol (SYSTANE ULTRA) 0.4-0.3 % SOLN Apply to eye as directed.     traMADol  (ULTRAM) 50 MG tablet TAKE 1 TABLET BY MOUTH EVERY 6 HOURS AS NEEDED FOR PAIN 120 tablet 5   No current facility-administered medications on file prior to visit.    Allergies:   Allergies  Allergen Reactions   Atorvastatin Other (See Comments)    Muscle cramps   Simvastatin Other (See Comments)    Muscle cramps   Statins Hives    pts wife states its two statin medications he is allergic to    Physical Exam General: Frail elderly Caucasian male, seated, in no evident distress Head: head normocephalic and atraumatic.   Neck: supple with no carotid or supraclavicular bruits Cardiovascular: regular rate and rhythm, no murmurs Musculoskeletal: Kyphoscoliosis moderate. Skin:  no rash/petichiae Vascular:  Normal pulses all extremities  Neurologic Exam Mental Status: Awake and fully alert. Oriented to place and time. Recent and remote memory intact. Attention span, concentration and fund of knowledge appropriate. Mood and affect appropriate.  Positive glabellar tap.  Diminished facial expression.  Hypophonic voice. Cranial Nerves: Fundoscopic exam not done. Pupils equal, briskly reactive to light. Extraocular movements full without nystagmus. Visual fields full to confrontation. Hearing intact. Facial sensation intact.  Voice is hypophonic.  Mild left lower facial asymmetry when he smiles., tongue, palate moves normally and symmetrically.  Motor: Normal bulk and tone. Normal strength in all tested extremity muscles.  Mild diminished fine finger movements on the left.  Orbits right over left upper extremity.  Finger tapping is slow bilaterally but no discrimination no cogwheel rigidity even with activation Sensory.: intact to touch , pinprick , position and vibratory sensation.  Coordination: Rapid alternating movements normal in all extremities. Finger-to-nose and heel-to-shin performed accurately bilaterally. Gait and Station: Arises from chair with slight difficulty. Stance is stooped. gait  demonstrates short steps with mild  festination but no retropulsion..    Reflexes: 1+ and symmetric. Toes downgoing.   NIHSS 1 Modified Rankin  2   ASSESSMENT: 75 year old Caucasian male with left leg weakness in February 2021 secondary to right frontal subcortical lacunar infarct from small vessel disease.  MRI also shows previous silent bilateral thalamic lacunar infarcts  from small vessel disease. .  New diagnosis of atrial fibrillation in February 2022 and started on Eliquis.  Stroke vascular risk factors of hypertension, hyperlipidemia age and cerebrovascular disease.  Also history of Parkinson's disease well-controlled on current medication regimen of Sinemet.  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  I recommend checking EEG for seizure activity and trial of Keppra XR 500 mg daily for seizure prevention.  I also advised him to avoid seizure provoking stimuli like sleep deprivation, irregular eating and sleeping habits and abrupt discontinuation of medications.  Check screening carotid ultrasound and transcranial Doppler studies.  Continue Eliquis for stroke prevention given his recent diagnosis of atrial fibrillation as well as aggressive risk factor modification with strict blood pressure control below 130/90, lipids with LDL cholesterol goal below 70 mg percent and diabetes with hemoglobin A1c goal below 6.5%.  He was also advised to follow-up for his Parkinson's disease with Dr. Carles Collet.  He will return for follow-up with me on an as practitioner in 3 months or call earlier if necessary. Greater than 50%  time during this 50-minute prolonged visit was spent on counseling and coordination of care about his lacunar strokes and discussion about stroke prevention and treatment and answering questions about his strokes, atrial fibrillation and complex partial seizures  Antony Contras, MD  [facility] [address] [place], [place] [address]  Phone [phone] Fax [phone] Note: This document was prepared with digital dictation and possible smart phrase technology. Any transcriptional errors that result from this process are unintentional.

## 2020-11-23 ENCOUNTER — Ambulatory Visit (INDEPENDENT_AMBULATORY_CARE_PROVIDER_SITE_OTHER): Payer: Medicare HMO | Admitting: Neurology

## 2020-11-23 DIAGNOSIS — R569 Unspecified convulsions: Secondary | ICD-10-CM | POA: Diagnosis not present

## 2020-11-23 DIAGNOSIS — I6381 Other cerebral infarction due to occlusion or stenosis of small artery: Secondary | ICD-10-CM

## 2020-11-24 ENCOUNTER — Telehealth: Payer: Self-pay | Admitting: Neurology

## 2020-11-24 ENCOUNTER — Telehealth: Payer: Self-pay

## 2020-11-24 ENCOUNTER — Encounter: Payer: Self-pay | Admitting: Neurology

## 2020-11-24 NOTE — Telephone Encounter (Signed)
ready to be scheduled  [employer] no Roxanna Mew, sent Butch Penny a message she will reach out to the patient to schedule.

## 2020-11-24 NOTE — Telephone Encounter (Signed)
-----   Message from Garvin Fila, MD sent at 11/24/2020  9:34 AM EST ----- Kindly inform the patient that brainwave study shows mild slowing of brain activity which is common but nothing to worry about.  No definite seizure activity is noted. ----- Message ----- From: Garvin Fila, MD Sent: 11/23/2020   4:33 PM EST To: Garvin Fila, MD

## 2020-11-24 NOTE — Telephone Encounter (Signed)
My chart message sent to pt.

## 2020-11-25 ENCOUNTER — Telehealth: Payer: Self-pay

## 2020-11-25 NOTE — Telephone Encounter (Signed)
Called patients wife back and let her know to follow up and to let her know to let us know what they are doing for the patient

## 2020-11-27 ENCOUNTER — Encounter: Payer: Self-pay | Admitting: Internal Medicine

## 2020-11-28 ENCOUNTER — Encounter: Payer: Self-pay | Admitting: Neurology

## 2020-11-29 ENCOUNTER — Telehealth: Payer: Self-pay

## 2020-11-29 NOTE — Telephone Encounter (Signed)
Spoke with spouse, they stopped the Keppra due to generalized weakness and fatigue and now he is doing better. Even though Keppra can cause sleepiness, I am not sure if it is the reason. I have asked the wife to restart the medication tonight and continue to observed him for the next days and call us for any changes in mental status. She is comfortable with plans.   Dr. April Manson.

## 2020-11-29 NOTE — Telephone Encounter (Signed)
Pt was seen by Dr Leonie Man on 11/24/20 Wife states pt was experiencing Severe Weakness x 3 day after Last dose of Keppra on 11/24/20. Pt has not continued medication, wife would like to D/C keppra. Please advise.

## 2020-11-29 NOTE — Progress Notes (Signed)
Cardiology Office Note:    Date:  11/30/2020   ID:  Brandon Rojas, DOB 07-22-1945, MRN [id number]  PCP:  Biagio Borg, MD  Cardiologist:  Minus Breeding, MD   Referring MD: Biagio Borg, MD   Chief Complaint  Patient presents with   Follow-up    HTN, PAF     History of Present Illness:    Brandon Rojas is a 75 y.o. male with a hx of Parkinson's disease,CAD s/p CABG in 2006, OA, PAF on chronic anticoagulation, MR/TR, HTN, HLD, DM2, and CVA. He has chronic LE edema, but stable volume on torsemide. He was admitted to the hospital 02/2020 with right sided heart failure and new onset Afib. Marked hypervolemia responded with aggressive diuresis.  He underwent TEE-guided DCCV in 02/2020 with successful conversion to NSR. He was last seen in clinic 09/06/20 after several ER visits and a hernia repair. He has since seen Dr. Leonie Man 11/22/20 for a second opinion after several episodes of AMS and imaging consistent with infarcts.   In summary: Was on ASA and plavix for CVA and CABG, now on ASA and eliquis in the setting of new onset Afib in 02/2020 s/p DCCV. Small right thalamic lacunar infarct 04/09/19.  Left frontal subcortical lacunar infarct 10/28/20 Small right infarct in the cingulate gyrus 09/10/20 Carotid ultrasound 06/15/20 was unremarkable.  LDL 65, A1c 5.4% (09/10/20)  Dr. Leonie Man felt his presentations were concerning for complex partial seizures as the etiology for his episodes of AMS. He started 500 mg keppra at night - first dose was last night.   He presents today for scheduled follow up. He denies chest  pain but has episodes of needing to take a deep breath at rest. Episodes of breathlessness occur once daily. Bradycardic today in the 40-50s. No syncope, chest pain, dizziness/lightheadedness, and orthopnea/PND/ worsening lower extremity edema. He had a fall in July when his legs gave out while getting into the cookie jar. I think from a cardiac standpoint, he is stable. I suspect his  breathlessness may be due to his valvular disease. He does not appear volume up on exam.   Bradycardia - he has baseline HR in the 40-50s, per their log.    Past Medical History:  Diagnosis Date   Anxiety state, unspecified    Arthritis    Benign neoplasm of colon    CAD (coronary artery disease) of artery bypass graft 05/20/2018   Depressive disorder, not elsewhere classified    Diaphragmatic hernia without mention of obstruction or gangrene    Diverticulosis of colon (without mention of hemorrhage)    Esophageal reflux    Esophageal stricture    Hiatal hernia    Mitral regurgitation    Osteoarthrosis, unspecified whether generalized or localized, unspecified site    Other and unspecified hyperlipidemia    Parkinson's disease ([place])    Personal history of colonic polyps    Stroke ([place])    Unspecified essential hypertension     Past Surgical History:  Procedure Laterality Date   CARDIOVERSION N/A 03/01/2020   Procedure: CARDIOVERSION;  Surgeon: Pixie Casino, MD;  Location: [facility] ENDOSCOPY;  Service: Cardiovascular;  Laterality: N/A;   COLONOSCOPY     CORONARY ARTERY BYPASS GRAFT     x 2   INGUINAL HERNIA REPAIR     bilateral   IR EXCHANGE BILIARY DRAIN  11/06/2018   IR PERC CHOLECYSTOSTOMY  05/22/2018   IR RADIOLOGIST EVAL & MGMT  06/24/2018   IR RADIOLOGIST EVAL &  MGMT  07/01/2018   LUNG BIOPSY     POLYPECTOMY     PTCA     TEE WITHOUT CARDIOVERSION N/A 03/01/2020   Procedure: TRANSESOPHAGEAL ECHOCARDIOGRAM (TEE);  Surgeon: Pixie Casino, MD;  Location: [facility] ENDOSCOPY;  Service: Cardiovascular;  Laterality: N/A;    Current Medications: Current Meds  Medication Sig   albuterol (VENTOLIN HFA) 108 (90 Base) MCG/ACT inhaler Inhale 1-2 puffs into the lungs every 6 (six) hours as needed for wheezing or shortness of breath.   apixaban (ELIQUIS) 5 MG TABS tablet TAKE 1 TABLET (5 MG TOTAL) BY MOUTH TWO TIMES DAILY.   aspirin 81 MG chewable tablet Chew 1 tablet by mouth daily.    B Complex Vitamins (VITAMIN B COMPLEX PO) Take 1 tablet by mouth as needed.   carbidopa-levodopa (SINEMET IR) 25-100 MG tablet 2 at 7 AM/1 at 11 AM/2 at 3 PM/1 tblet at 7 PM   esomeprazole (NEXIUM) 40 MG capsule Take 1 capsule (40 mg total) by mouth daily.   levETIRAcetam (KEPPRA XR) 500 MG 24 hr tablet Take 1 tablet (500 mg total) by mouth daily.   lisinopril (ZESTRIL) 40 MG tablet Take 0.5 tablets by mouth daily.   lovastatin (MEVACOR) 20 MG tablet TAKE 1 TABLET AT BEDTIME   Multiple Vitamin (MULTIVITAMIN WITH MINERALS) TABS tablet Take 1 tablet by mouth daily.   nitroGLYCERIN (NITROSTAT) 0.4 MG SL tablet PLACE 1 TABLET (0.4 MG TOTAL) UNDER THE TONGUE EVERY 5 (FIVE) MINUTES AS NEEDED.   Polyethyl Glycol-Propyl Glycol (SYSTANE ULTRA) 0.4-0.3 % SOLN Apply to eye as directed.   traMADol (ULTRAM) 50 MG tablet TAKE 1 TABLET BY MOUTH EVERY 6 HOURS AS NEEDED FOR PAIN     Allergies:   Atorvastatin, Simvastatin, and Statins   Social History   Socioeconomic History   Marital status: Married    Spouse name: Not on file   Number of children: 3   Years of education: Not on file   Highest education level: Doctorate  Occupational History   Occupation: PASTOR    Employer: [employer]  Tobacco Use   Smoking status: Former    Types: Cigarettes    Quit date: 02/19/1972    Years since quitting: 48.8   Smokeless tobacco: Never  Vaping Use   Vaping Use: Never used  Substance and Sexual Activity   Alcohol use: No    Alcohol/week: 0.0 standard drinks   Drug use: No   Sexual activity: Not on file  Other Topics Concern   Not on file  Social History Narrative   Right handed    Lives in one story home with a basement that he does not use.   Social Determinants of Health   Financial Resource Strain: Not on file  Food Insecurity: Not on file  Transportation Needs: Not on file  Physical Activity: Not on file  Stress: Not on file  Social Connections: Not on file     Family  History: The patient's family history includes Colon cancer in his sister; Fibromyalgia in his daughter; Healthy in his brother, brother, brother, daughter, and sister; Heart attack in his mother; Heart disease in his brother; Multiple sclerosis in his son; Stroke in his father.  ROS:   Please see the history of present illness.     All other systems reviewed and are negative.  EKGs/Labs/Other Studies Reviewed:    The following studies were reviewed today:  Echo 02/26/20: 1. Left ventricular ejection fraction, by estimation, is 60 to 65%. The  left ventricle has normal function. The left ventricle has no regional  wall motion abnormalities. There is mild left ventricular hypertrophy.  Left ventricular diastolic parameters  are indeterminate. There is the interventricular septum is flattened in  systole and diastole, consistent with right ventricular pressure and  volume overload.   2. Right ventricular systolic function is moderately reduced. The right  ventricular size is moderately enlarged. There is normal pulmonary artery  systolic pressure. The estimated right ventricular systolic pressure is  70.0 mmHg.   3. Left atrial size was moderately dilated.   4. Right atrial size was severely dilated.   5. The mitral valve is normal in structure. Mild mitral valve  regurgitation. No evidence of mitral stenosis.   6. Tricuspid valve regurgitation is severe.   7. The aortic valve is grossly normal. Aortic valve regurgitation is not  visualized. Mild aortic valve sclerosis is present, with no evidence of  aortic valve stenosis.   8. Pulmonic valve regurgitation is moderate.   9. The inferior vena cava is dilated in size with <50% respiratory  variability, suggesting right atrial pressure of 15 mmHg.  EKG:  EKG is  ordered today.  The ekg ordered today demonstrates atrial fibrillation with ventricular rate 42, RBBB  Recent Labs: 02/26/2020: B Natriuretic Peptide 598.7; TSH  4.470 03/02/2020: Magnesium 1.9 04/11/2020: ALT 4; Hemoglobin 12.0; Platelets 200.0 09/20/2020: BUN 20; Creatinine, Ser 1.34; Potassium 4.3; Sodium 141  Recent Lipid Panel    Component Value Date/Time   CHOL 113 04/11/2020 1204   TRIG 118.0 04/11/2020 1204   HDL 51.10 04/11/2020 1204   CHOLHDL 2 04/11/2020 1204   VLDL 23.6 04/11/2020 1204   LDLCALC 38 04/11/2020 1204    Physical Exam:    VS:  BP 118/72   Pulse (!) 42   Ht 5\' 4"  (1.626 m)   Wt 134 lb 9.6 oz (61.1 kg)   SpO2 98%   BMI 23.10 kg/m     Wt Readings from Last 3 Encounters:  11/30/20 134 lb 9.6 oz (61.1 kg)  11/22/20 135 lb (61.2 kg)  10/26/20 135 lb (61.2 kg)     GEN:  Well nourished, well developed in no acute distress HEENT: Normal NECK: No JVD; No carotid bruits LYMPHATICS: No lymphadenopathy CARDIAC: irregular rhythm, regular rate, + murmur  RESPIRATORY:  Clear to auscultation without rales, wheezing or rhonchi  ABDOMEN: Soft, non-tender, non-distended MUSCULOSKELETAL:  No edema; No deformity  SKIN: Warm and dry NEUROLOGIC:  Alert and oriented x 3 PSYCHIATRIC:  Normal affect   ASSESSMENT:    1. Paroxysmal atrial fibrillation (HCC)   2. Essential hypertension   3. Chronic diastolic CHF (congestive heart failure) ([place])   4. RVF (right ventricular failure) ([place])   5. Breathlessness   6. Chronic anticoagulation   7. Bradycardia   8. Coronary artery disease involving coronary bypass graft of native heart with unstable angina pectoris ([place])   9. S/P CABG (coronary artery bypass graft)   10. Hyperlipidemia LDL goal <70   11. Tricuspid valve insufficiency, unspecified etiology   12. Nonrheumatic mitral valve regurgitation   13. Parkinson's disease ([place])    PLAN:    In order of problems listed above:     Chronic diastolic heart failure RV failure Breathlessness - intermittent Chronic LE edema - compression stockings daily, except today - no longer taking torsemide - appears near euvolemic,  despite not wearing compression stockings today --> advised to wear every other day (he doesn't like wearing them)  Paroxysmal atrial fibrillation Chronic anticoagulation Bradycardia - successful DCCV 02/2020 --> but is now back in Afib - he is rate controlled and seems to be tolerating the rhythm - continue eliquis for stroke PPX - heart rate in the 40-50s - on no AV nodal agents - could be contributing to his breathlessness   CAD s/p CABG 2006 Hyperlipidemia with LDL goal < 70 - continue ASA, no longer on plavix in the setting of eliquis - continue statin 04/11/2020: Cholesterol 113; HDL 51.10; LDL Cholesterol 38; Triglycerides 118.0; VLDL 23.6   Hypertension [name] - can expect labile BP/autonomic dysfunction - will be liberal with BP goal - continue 20 mg lisinopril   Follow up with Dr. Percival Spanish as scheduled.   Medication Adjustments/Labs and Tests Ordered: Current medicines are reviewed at length with the patient today.  Concerns regarding medicines are outlined above.  Orders Placed This Encounter  Procedures   EKG 12-Lead   No orders of the defined types were placed in this encounter.   Signed, Ledora Bottcher, PA  11/30/2020 1:35 PM    [facility]

## 2020-11-29 NOTE — Telephone Encounter (Signed)
Sent phone note to MD.

## 2020-11-30 ENCOUNTER — Encounter: Payer: Self-pay | Admitting: Physician Assistant

## 2020-11-30 ENCOUNTER — Ambulatory Visit (INDEPENDENT_AMBULATORY_CARE_PROVIDER_SITE_OTHER): Payer: Medicare HMO | Admitting: Physician Assistant

## 2020-11-30 ENCOUNTER — Other Ambulatory Visit: Payer: Self-pay

## 2020-11-30 VITALS — BP 118/72 | HR 42 | Ht 64.0 in | Wt 134.6 lb

## 2020-11-30 DIAGNOSIS — R0681 Apnea, not elsewhere classified: Secondary | ICD-10-CM

## 2020-11-30 DIAGNOSIS — Z951 Presence of aortocoronary bypass graft: Secondary | ICD-10-CM

## 2020-11-30 DIAGNOSIS — I5032 Chronic diastolic (congestive) heart failure: Secondary | ICD-10-CM

## 2020-11-30 DIAGNOSIS — I10 Essential (primary) hypertension: Secondary | ICD-10-CM | POA: Diagnosis not present

## 2020-11-30 DIAGNOSIS — I25700 Atherosclerosis of coronary artery bypass graft(s), unspecified, with unstable angina pectoris: Secondary | ICD-10-CM

## 2020-11-30 DIAGNOSIS — G20 Parkinson's disease: Secondary | ICD-10-CM

## 2020-11-30 DIAGNOSIS — R001 Bradycardia, unspecified: Secondary | ICD-10-CM

## 2020-11-30 DIAGNOSIS — I480 Paroxysmal atrial fibrillation: Secondary | ICD-10-CM | POA: Diagnosis not present

## 2020-11-30 DIAGNOSIS — Z7901 Long term (current) use of anticoagulants: Secondary | ICD-10-CM | POA: Diagnosis not present

## 2020-11-30 DIAGNOSIS — I340 Nonrheumatic mitral (valve) insufficiency: Secondary | ICD-10-CM

## 2020-11-30 DIAGNOSIS — I071 Rheumatic tricuspid insufficiency: Secondary | ICD-10-CM

## 2020-11-30 DIAGNOSIS — I50810 Right heart failure, unspecified: Secondary | ICD-10-CM | POA: Diagnosis not present

## 2020-11-30 DIAGNOSIS — E785 Hyperlipidemia, unspecified: Secondary | ICD-10-CM

## 2020-11-30 NOTE — Patient Instructions (Addendum)
Medication Instructions:  Your physician recommends that you continue on your current medications as directed. Please refer to the Current Medication list given to you today.  *If you need a refill on your cardiac medications before your next appointment, please call your pharmacy*  Lab Work: NONE ordered at this time of appointment   If you have labs (blood work) drawn today and your tests are completely normal, you will receive your results only by: [place] (if you have MyChart) OR A paper copy in the mail If you have any lab test that is abnormal or we need to change your treatment, we will call you to review the results.  Testing/Procedures: NONE ordered at this time of appointment   Follow-Up: At [facility], you and your health needs are our priority.  As part of our continuing mission to provide you with exceptional heart care, we have created designated Provider Care Teams.  These Care Teams include your primary Cardiologist (physician) and Advanced Practice Providers (APPs -  Physician Assistants and Nurse Practitioners) who all work together to provide you with the care you need, when you need it.  Your next appointment:   As scheduled    The format for your next appointment:   In Person  Provider:   Minus Breeding, MD    Other Instructions

## 2020-12-01 ENCOUNTER — Encounter: Payer: Self-pay | Admitting: Neurology

## 2020-12-04 ENCOUNTER — Encounter: Payer: Self-pay | Admitting: Internal Medicine

## 2020-12-05 MED ORDER — DIVALPROEX SODIUM ER 500 MG PO TB24: 500.0000 mg | ORAL_TABLET | Freq: Every day | ORAL | 3 refills | Status: DC

## 2020-12-05 NOTE — Telephone Encounter (Signed)
Spoke with patient wife, stated she  restarted the Keppra and it made Arnie very sleepy, she would want to discontinue it and try something else.  At this time, I think is reasonable to discontinue the Keppra and start the patient on Depakote XR 500 mg daily.  His latest LFT was within normal limits.   Advise her to call us for any side effect.   Alric Ran, MD

## 2020-12-05 NOTE — Telephone Encounter (Signed)
Ok to contact pt wife  Pt needs ROV next available any provider

## 2020-12-06 ENCOUNTER — Encounter: Payer: Self-pay | Admitting: Cardiology

## 2020-12-06 NOTE — Telephone Encounter (Signed)
I called the patient's wife and reviewed the potential side effects again. Brandon Rojas was also on speaker phone. He is agreeable to try the generic Depakote XR. They will call our office back with any concerns.

## 2020-12-07 ENCOUNTER — Ambulatory Visit (HOSPITAL_COMMUNITY)
Admission: RE | Admit: 2020-12-07 | Discharge: 2020-12-07 | Disposition: A | Discharge status: Home / Self Care | Payer: Medicare HMO | Source: Ambulatory Visit | Attending: Neurology | Admitting: Neurology

## 2020-12-07 ENCOUNTER — Ambulatory Visit (HOSPITAL_BASED_OUTPATIENT_CLINIC_OR_DEPARTMENT_OTHER)
Admission: RE | Admit: 2020-12-07 | Discharge: 2020-12-07 | Disposition: A | Discharge status: Home / Self Care | Payer: Medicare HMO | Source: Ambulatory Visit | Attending: Neurology | Admitting: Neurology

## 2020-12-07 DIAGNOSIS — I6381 Other cerebral infarction due to occlusion or stenosis of small artery: Secondary | ICD-10-CM

## 2020-12-07 NOTE — Progress Notes (Signed)
TCD  and carotid duplex has been completed.   Preliminary results in CV Proc.   Brandon Rojas 12/07/2020 1:50 PM

## 2020-12-08 ENCOUNTER — Encounter: Payer: Self-pay | Admitting: *Deleted

## 2020-12-09 ENCOUNTER — Encounter: Payer: Self-pay | Admitting: Neurology

## 2020-12-09 ENCOUNTER — Encounter: Payer: Self-pay | Admitting: Internal Medicine

## 2020-12-13 ENCOUNTER — Ambulatory Visit (INDEPENDENT_AMBULATORY_CARE_PROVIDER_SITE_OTHER): Payer: Medicare HMO | Admitting: Emergency Medicine

## 2020-12-13 ENCOUNTER — Encounter: Payer: Self-pay | Admitting: Emergency Medicine

## 2020-12-13 ENCOUNTER — Other Ambulatory Visit: Payer: Self-pay

## 2020-12-13 ENCOUNTER — Ambulatory Visit (INDEPENDENT_AMBULATORY_CARE_PROVIDER_SITE_OTHER): Payer: Medicare HMO

## 2020-12-13 VITALS — BP 110/60 | HR 56 | Ht 64.0 in | Wt 138.0 lb

## 2020-12-13 DIAGNOSIS — R0602 Shortness of breath: Secondary | ICD-10-CM | POA: Diagnosis not present

## 2020-12-13 DIAGNOSIS — I5043 Acute on chronic combined systolic (congestive) and diastolic (congestive) heart failure: Secondary | ICD-10-CM | POA: Diagnosis not present

## 2020-12-13 DIAGNOSIS — I517 Cardiomegaly: Secondary | ICD-10-CM | POA: Diagnosis not present

## 2020-12-13 DIAGNOSIS — R062 Wheezing: Secondary | ICD-10-CM | POA: Diagnosis not present

## 2020-12-13 MED ORDER — FUROSEMIDE 40 MG PO TABS: 40.0000 mg | ORAL_TABLET | Freq: Every day | ORAL | 3 refills | Status: DC

## 2020-12-13 NOTE — Assessment & Plan Note (Signed)
Clinical and radiographical findings of congestive heart failure presenting with wheezing and shortness of breath.  Patient also has history of pulmonary hypertension.  Will benefit from starting diuretic, Lasix 40 mg daily. He responded well to diuretic therapy last February when he was at the hospital and was found to be volume overloaded.  Echo done at the time reviewed. ED precautions given. Advised to follow-up with PCP, Dr. [name] next week.

## 2020-12-13 NOTE — Patient Instructions (Signed)
Heart Failure, Diagnosis °Heart failure means that your heart is not able to pump blood in the right way. This makes it hard for your body to work well. Heart failure is usually a long-term (chronic) condition. You must take good care of yourself and follow your treatment plan from your doctor. °Different stages of heart failure have different treatment plans. The stages are: °Stage A: At risk for heart failure. °Stage B: Pre-heart failure. °Stage C: Symptomatic heart failure. °Stage D: Advanced heart failure. °What are the causes? °High blood pressure. °Buildup of cholesterol and fat in the arteries. °Heart attack. This injures the heart muscle. °Heart valves that do not open and close properly. °Damage of the heart muscle. This is also called cardiomyopathy. °Infection of the heart muscle. This is also called myocarditis. °Lung disease. °What increases the risk? °Getting older. The risk of heart failure goes up as a person ages. °Being overweight. °Using tobacco or nicotine products. °Abusing alcohol or drugs. °Having taken medicines that can damage the heart. °Having any of these conditions: °Diabetes. °Abnormal heart rhythms. °Thyroid problems. °Low blood counts (anemia). °Having a family history of heart failure. °What are the signs or symptoms? °Shortness of breath. °Coughing. °Swelling of the feet, ankles, legs, or belly. °Losing or gaining weight for no reason. °Trouble breathing. °Waking from sleep because of the need to sit up and get more air. °Fast heartbeat. °Other symptoms may include: °Being very tired. °Feeling dizzy, or feeling like you may pass out (faint). °Having no desire to eat. °Feeling like you may vomit (nauseous). °Peeing (urinating) more at night. °Feeling confused. °How is this treated? °This condition may be treated with: °Medicines. These can be given to treat blood pressure and to make the heart muscles stronger. °Changes in your daily life. These may include: °Eating a healthy  diet. °Staying at a healthy body weight. °Quitting tobacco, alcohol, and drug use. °Doing exercises. °Participating in a cardiac rehabilitation program. This program helps you improve your health through exercise, education, and counseling. °Surgery. Surgery can be done to open blocked valves or to put devices in the heart, such as pacemakers. °A donor heart (heart transplant). You will receive a healthy heart from a donor. °Follow these instructions at home: °Treat other conditions as told by your doctor. These may include high blood pressure, diabetes, thyroid disease, or abnormal heart rhythms. °Learn as much as you can about heart failure. °Get support as you need it. °Keep all follow-up visits. °Where to find more information °American Heart Association: www.heart.org °Centers for Disease Control and Prevention: www.cdc.gov °National Institute on Aging: www.nia.nih.gov °Summary °Heart failure means that your heart is not able to pump blood in the right way. °This condition is often caused by high blood pressure, heart attack, or damage of the heart muscle. °Symptoms of this condition include shortness of breath and swelling of the feet, ankles, legs, or belly. You may also feel very tired or feel like you may vomit. °You may be treated with medicines, surgery, or changes in your daily life. °Treat other health conditions as told by your doctor. °This information is not intended to replace advice given to you by your health care provider. Make sure you discuss any questions you have with your health care provider. °Document Revised: 06/23/2020 Document Reviewed: 07/18/2019 °Elsevier Patient Education © 2022 [employer] ° °

## 2020-12-13 NOTE — Progress Notes (Signed)
BP Readings from Last 3 Encounters:  11/30/20 118/72  11/22/20 (!) 161/70  10/26/20 102/60   Lab Results  Component Value Date   CREATININE 1.34 09/20/2020   BUN 20 09/20/2020   NA 141 09/20/2020   K 4.3 09/20/2020   CL 105 09/20/2020   CO2 28 09/20/2020   Wt Readings from Last 3 Encounters:  12/13/20 138 lb (62.6 kg)  11/30/20 134 lb 9.6 oz (61.1 kg)  11/22/20 135 lb (61.2 kg)   Brandon Rojas 75 y.o.   Chief Complaint  Patient presents with   Cough    Chest congestion and wheezing, x 1 wk    HISTORY OF PRESENT ILLNESS: Acute problem visit today. This is a 75 y.o. male here with his wife, patient of Dr. [name], complaining of 1 week history of chest congestion and wheezing. Denies chest pain.  Denies syncope.  Denies nausea or vomiting. Has history of coronary artery disease, chronic A. fib on long-term anticoagulation with Eliquis, pulmonary hypertension, Parkinson's disease, diabetes, dyslipidemia, and chronic kidney disease.   HPI   Prior to Admission medications   Medication Sig Start Date End Date Taking? Authorizing Provider  albuterol (VENTOLIN HFA) 108 (90 Base) MCG/ACT inhaler Inhale 1-2 puffs into the lungs every 6 (six) hours as needed for wheezing or shortness of breath. 04/11/20  Yes Biagio Borg, MD  apixaban (ELIQUIS) 5 MG TABS tablet TAKE 1 TABLET (5 MG TOTAL) BY MOUTH TWO TIMES DAILY. 04/11/20 04/11/21 Yes Biagio Borg, MD  aspirin 81 MG chewable tablet Chew 1 tablet by mouth daily. 09/13/20  Yes [provider]  B Complex Vitamins (VITAMIN B COMPLEX PO) Take 1 tablet by mouth as needed.   Yes [provider]  carbidopa-levodopa (SINEMET IR) 25-100 MG tablet 2 at 7 AM/1 at 11 AM/2 at 3 PM/1 tblet at 7 PM 07/12/20  Yes Tat, Wells Guiles S, DO  divalproex (DEPAKOTE ER) 500 MG 24 hr tablet Take 1 tablet (500 mg total) by mouth daily. 12/05/20 01/04/21 Yes Camara, Maryan Puls, MD  esomeprazole (NEXIUM) 40 MG capsule Take 1 capsule (40 mg total) by mouth  daily. 04/06/20  Yes Biagio Borg, MD  lisinopril (ZESTRIL) 40 MG tablet Take 0.5 tablets by mouth daily. 09/12/20  Yes [provider]  lovastatin (MEVACOR) 20 MG tablet TAKE 1 TABLET AT BEDTIME 08/30/20  Yes Biagio Borg, MD  Multiple Vitamin (MULTIVITAMIN WITH MINERALS) TABS tablet Take 1 tablet by mouth daily.   Yes [provider]  nitroGLYCERIN (NITROSTAT) 0.4 MG SL tablet PLACE 1 TABLET (0.4 MG TOTAL) UNDER THE TONGUE EVERY 5 (FIVE) MINUTES AS NEEDED. 07/01/19  Yes Minus Breeding, MD  Polyethyl Glycol-Propyl Glycol (SYSTANE ULTRA) 0.4-0.3 % SOLN Apply to eye as directed.   Yes [provider]  traMADol (ULTRAM) 50 MG tablet TAKE 1 TABLET BY MOUTH EVERY 6 HOURS AS NEEDED FOR PAIN 10/25/20  Yes Biagio Borg, MD    Allergies  Allergen Reactions   Atorvastatin Other (See Comments)    Muscle cramps   Simvastatin Other (See Comments)    Muscle cramps   Statins Hives    pts wife states its two statin medications he is allergic to    Patient Active Problem List   Diagnosis Date Noted   Weakness on left side of face 10/26/2020   Bradycardia 10/26/2020   Wheezing 10/26/2020   Stroke, lacunar ([place]) 09/20/2020   Aortic atherosclerosis ([place]) 08/12/2020   Inguinal hernia with bowel obstruction 06/04/2020  Restrictive lung disease 04/11/2020   Chronic anticoagulation 04/11/2020   Acute renal failure superimposed on chronic kidney disease ([place]) 04/11/2020   Leg swelling 16/10/9602   Acute diastolic CHF (congestive heart failure) (HCC)    Atrial fibrillation, new onset ([place]) 02/26/2020   Dyspnea 01/15/2020   Type 2 diabetes mellitus with complication, without long-term current use of insulin ([place]) 06/30/2019   Educated about COVID-19 virus infection 06/30/2019   TIA (transient ischemic attack) 08/26/2018   Nocturia 05/30/2018   Elevated troponin 05/20/2018   Epigastric pain 05/20/2018   CAD (coronary artery disease) of artery bypass graft 05/20/2018   Acute  cholecystitis 05/20/2018   Subacute left lumbar radiculopathy 06/23/2015   Mitral regurgitation 09/17/2014   Parkinson's disease ([place]) 08/19/2014   Pulmonary hypertension ([place]) 03/30/2014   Piriformis syndrome of left side 03/02/2014   Peripheral edema 01/15/2014   Movement disorder 01/15/2014   Hypercalcemia 01/15/2014   Left hip pain 01/15/2014   Hoarseness 01/15/2014   Back pain 06/08/2010   Encounter for long-term (current) use of high-risk medication 06/08/2010   ED (erectile dysfunction) 06/08/2010   Encounter for well adult exam with abnormal findings 06/07/2010   SHOULDER PAIN, LEFT 08/30/2009   CAROTID ARTERY DISEASE 01/25/2009   BRADYCARDIA 10/08/2007   COLONIC POLYPS 03/20/2007   HIATAL HERNIA 03/20/2007   DIVERTICULOSIS, COLON 03/20/2007   Hyperlipidemia 09/19/2006   OBESITY 09/19/2006   Anxiety state 09/19/2006   ERECTILE DYSFUNCTION 09/19/2006   Depression 09/19/2006   Essential hypertension 09/19/2006   Coronary atherosclerosis 09/19/2006   ALLERGIC RHINITIS 09/19/2006   GERD 09/19/2006   OSTEOARTHRITIS 09/19/2006   History of TIA (transient ischemic attack) 09/19/2006   PSORIASIS, HX OF 09/19/2006    Past Medical History:  Diagnosis Date   Anxiety state, unspecified    Arthritis    Benign neoplasm of colon    CAD (coronary artery disease) of artery bypass graft 05/20/2018   Depressive disorder, not elsewhere classified    Diaphragmatic hernia without mention of obstruction or gangrene    Diverticulosis of colon (without mention of hemorrhage)    Esophageal reflux    Esophageal stricture    Hiatal hernia    Mitral regurgitation    Osteoarthrosis, unspecified whether generalized or localized, unspecified site    Other and unspecified hyperlipidemia    Parkinson's disease ([place])    Personal history of colonic polyps    Stroke ([place])    Unspecified essential hypertension     Past Surgical History:  Procedure Laterality Date   CARDIOVERSION N/A  03/01/2020   Procedure: CARDIOVERSION;  Surgeon: Pixie Casino, MD;  Location: [facility];  Service: Cardiovascular;  Laterality: N/A;   COLONOSCOPY     CORONARY ARTERY BYPASS GRAFT     x 2   INGUINAL HERNIA REPAIR     bilateral   IR EXCHANGE BILIARY DRAIN  11/06/2018   IR PERC CHOLECYSTOSTOMY  05/22/2018   IR RADIOLOGIST EVAL & MGMT  06/24/2018   IR RADIOLOGIST EVAL & MGMT  07/01/2018   LUNG BIOPSY     POLYPECTOMY     PTCA     TEE WITHOUT CARDIOVERSION N/A 03/01/2020   Procedure: TRANSESOPHAGEAL ECHOCARDIOGRAM (TEE);  Surgeon: Pixie Casino, MD;  Location: [facility] ENDOSCOPY;  Service: Cardiovascular;  Laterality: N/A;    Social History   Socioeconomic History   Marital status: Married    Spouse name: Not on file   Number of children: 3   Years of education: Not on file   Highest education  level: Doctorate  Occupational History   Occupation: Mining engineer: [employer]  Tobacco Use   Smoking status: Former    Types: Cigarettes    Quit date: 02/19/1972    Years since quitting: 48.8   Smokeless tobacco: Never  Vaping Use   Vaping Use: Never used  Substance and Sexual Activity   Alcohol use: No    Alcohol/week: 0.0 standard drinks   Drug use: No   Sexual activity: Not on file  Other Topics Concern   Not on file  Social History Narrative   Right handed    Lives in one story home with a basement that he does not use.   Social Determinants of Health   Financial Resource Strain: Not on file  Food Insecurity: Not on file  Transportation Needs: Not on file  Physical Activity: Not on file  Stress: Not on file  Social Connections: Not on file  Intimate Partner Violence: Not on file    Family History  Problem Relation Age of Onset   Heart attack Mother    Stroke Father    Colon cancer Sister    Healthy Sister    Heart disease Brother    Healthy Brother    Healthy Brother    Healthy Brother    Healthy Daughter    Fibromyalgia Daughter     Multiple sclerosis Son      Review of Systems  Constitutional: Negative.  Negative for chills and fever.  HENT: Negative.  Negative for congestion and sore throat.   Respiratory:  Positive for shortness of breath and wheezing. Negative for cough, hemoptysis and sputum production.   Cardiovascular: Negative.  Negative for chest pain and palpitations.  Gastrointestinal: Negative.  Negative for abdominal pain, nausea and vomiting.  Genitourinary: Negative.  Negative for dysuria and hematuria.  Skin: Negative.   Neurological: Negative.  Negative for dizziness and headaches.  All other systems reviewed and are negative.  Today's Vitals   12/13/20 1312  BP: 110/60  Pulse: (!) 56  SpO2: 98%  Weight: 138 lb (62.6 kg)  Height: 5\' 4"  (1.626 m)   Body mass index is 23.69 kg/m.  Physical Exam Vitals reviewed.  Constitutional:      Appearance: Normal appearance.  HENT:     Head: Normocephalic.  Eyes:     Extraocular Movements: Extraocular movements intact.     Pupils: Pupils are equal, round, and reactive to light.  Cardiovascular:     Rate and Rhythm: Normal rate. Rhythm irregular.     Heart sounds: No murmur heard.   Gallop present.  Pulmonary:     Effort: Pulmonary effort is normal.     Breath sounds: Rales (Bilateral) present. No wheezing.  Abdominal:     Palpations: Abdomen is soft.     Tenderness: There is no abdominal tenderness.  Musculoskeletal:     Cervical back: No tenderness.  Lymphadenopathy:     Cervical: No cervical adenopathy.  Skin:    General: Skin is warm and dry.  Neurological:     General: No focal deficit present.     Mental Status: He is alert and oriented to person, place, and time.  Psychiatric:        Mood and Affect: Mood normal.        Behavior: Behavior normal.    EKG: A. fib-flutter with occasional PVCs.  Ventricular rate around 60s. DG Chest 2 View  Result Date: 12/13/2020 CLINICAL DATA:  Shortness of breath, wheezing, dyspnea  on  exertion EXAM: CHEST - 2 VIEW COMPARISON:  08/05/2020 FINDINGS: Cardiomegaly status post median sternotomy. Mild, diffuse bilateral interstitial pulmonary opacity. The visualized skeletal structures are unremarkable. IMPRESSION: Cardiomegaly with mild, diffuse bilateral interstitial pulmonary opacity, likely edema. No new or focal airspace opacity. Electronically Signed   By: Delanna Ahmadi M.D.   On: 12/13/2020 14:18    ASSESSMENT & PLAN: A total of 45 minutes was spent with the patient and counseling/coordination of care regarding preparing for this visit, review of most recent office visit notes, review of past medical history, review of most recent hospitalization, review of most recent echocardiogram, review of most recent blood work results, review of all medications and addition of Lasix 40 mg daily, review of chest x-ray done today, review of EKG done today, ED precautions, review of differential diagnosis for congestive heart failure, prognosis, documentation and need for follow-up with PCP next week.  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  Patient also has history of pulmonary hypertension.  Will benefit from starting diuretic, Lasix 40 mg daily. He responded well to diuretic therapy last February when he was at the hospital and was found to be volume overloaded.  Echo done at the time reviewed. ED precautions given. Advised to follow-up with PCP, Dr. [name] next week.      Relevant Medications   furosemide (LASIX) 40 MG tablet     Other   Dyspnea   Relevant Orders   EKG 12-Lead   DG Chest 2 View (Completed)   Wheezing   Relevant Orders   EKG 12-Lead   DG Chest 2 View (Completed)   Patient Instructions  Heart Failure,  Diagnosis Heart failure means that your heart is not able to pump blood in the right way. This makes it hard for your body to work well. Heart failure is usually a long-term (chronic) condition. You must take good care of yourself and follow your treatment plan from your doctor. Different stages of heart failure have different treatment plans. The stages are: Stage A: At risk for heart failure. Stage B: Pre-heart failure. Stage C: Symptomatic heart failure. Stage D: Advanced heart failure. What are the causes? High blood pressure. Buildup of cholesterol and fat in the arteries. Heart attack. This injures the heart muscle. Heart valves that do not open and close properly. Damage of the heart muscle. This is also called cardiomyopathy. Infection of the heart muscle. This is also called myocarditis. Lung disease. What increases the risk? Getting older. The risk of heart failure goes up as a person ages. Being overweight. Using tobacco or nicotine products. Abusing alcohol or drugs. Having taken medicines that can damage the heart. Having any of these conditions: Diabetes. Abnormal heart rhythms. Thyroid problems. Low blood counts (anemia). Having a family history of heart failure. What are the signs or symptoms? Shortness of breath. Coughing. Swelling of the feet, ankles, legs, or belly. Losing or gaining weight for no reason. Trouble breathing. Waking from sleep because of the need to sit up and get more air. Fast heartbeat. Other symptoms may include: Being very tired. Feeling dizzy, or feeling like you may pass out (faint). Having no desire to eat. Feeling like you may vomit (nauseous). Peeing (urinating) more at night. Feeling confused. How  is this treated? This condition may be treated with: Medicines. These can be given to treat blood pressure and to make the heart muscles stronger. Changes in your daily life. These may include: Eating a healthy diet. Staying at a  healthy body weight. Quitting tobacco, alcohol, and drug use. Doing exercises. Participating in a cardiac rehabilitation program. This program helps you improve your health through exercise, education, and counseling. Surgery. Surgery can be done to open blocked valves or to put devices in the heart, such as pacemakers. A donor heart (heart transplant). You will receive a healthy heart from a donor. Follow these instructions at home: Treat other conditions as told by your doctor. These may include high blood pressure, diabetes, thyroid disease, or abnormal heart rhythms. Learn as much as you can about heart failure. Get support as you need it. Keep all follow-up visits. Where to find more information American Heart Association: www.heart.org Centers for Disease Control and Prevention: http://www.wolf.info/ National Institute on Aging: http://kim-miller.com/ Summary Heart failure means that your heart is not able to pump blood in the right way. This condition is often caused by high blood pressure, heart attack, or damage of the heart muscle. Symptoms of this condition include shortness of breath and swelling of the feet, ankles, legs, or belly. You may also feel very tired or feel like you may vomit. You may be treated with medicines, surgery, or changes in your daily life. Treat other health conditions as told by your doctor. This information is not intended to replace advice given to you by your health care provider. Make sure you discuss any questions you have with your health care provider. Document Revised: 06/23/2020 Document Reviewed: 07/18/2019 Elsevier Patient Education  2022 [place], MD [facility]

## 2020-12-16 ENCOUNTER — Ambulatory Visit (INDEPENDENT_AMBULATORY_CARE_PROVIDER_SITE_OTHER): Payer: Medicare HMO

## 2020-12-16 ENCOUNTER — Other Ambulatory Visit: Payer: Self-pay

## 2020-12-16 DIAGNOSIS — Z0000 Encounter for general adult medical examination without abnormal findings: Secondary | ICD-10-CM

## 2020-12-16 NOTE — Patient Instructions (Signed)
Mr. Brandon Rojas , Thank you for taking time to come for your Medicare Wellness Visit. I appreciate your ongoing commitment to your health goals. Please review the following plan we discussed and let me know if I can assist you in the future.   Screening recommendations/referrals: Colonoscopy: no longer required  Recommended yearly ophthalmology/optometry visit for glaucoma screening and checkup Recommended yearly dental visit for hygiene and checkup  Vaccinations: Influenza vaccine: declined  Pneumococcal vaccine: completed  Tdap vaccine: 09/10/2019 Shingles vaccine: will consider     Advanced directives: will provide copies   Conditions/risks identified: none  Next appointment: 02/16/2021  1100am with Dr, Jenny Reichmann   Preventive Care 65 Years and Older, Male Preventive care refers to lifestyle choices and visits with your health care provider that can promote health and wellness. What does preventive care include? A yearly physical exam. This is also called an annual well check. Dental exams once or twice a year. Routine eye exams. Ask your health care provider how often you should have your eyes checked. Personal lifestyle choices, including: Daily care of your teeth and gums. Regular physical activity. Eating a healthy diet. Avoiding tobacco and drug use. Limiting alcohol use. Practicing safe sex. Taking low doses of aspirin every day. Taking vitamin and mineral supplements as recommended by your health care provider. What happens during an annual well check? The services and screenings done by your health care provider during your annual well check will depend on your age, overall health, lifestyle risk factors, and family history of disease. Counseling  Your health care provider may ask you questions about your: Alcohol use. Tobacco use. Drug use. Emotional well-being. Home and relationship well-being. Sexual activity. Eating habits. History of falls. Memory and ability to  understand (cognition). Work and work Statistician. Screening  You may have the following tests or measurements: Height, weight, and BMI. Blood pressure. Lipid and cholesterol levels. These may be checked every 5 years, or more frequently if you are over 66 years old. Skin check. Lung cancer screening. You may have this screening every year starting at age 44 if you have a 30-pack-year history of smoking and currently smoke or have quit within the past 15 years. Fecal occult blood test (FOBT) of the stool. You may have this test every year starting at age 67. Flexible sigmoidoscopy or colonoscopy. You may have a sigmoidoscopy every 5 years or a colonoscopy every 10 years starting at age 70. Prostate cancer screening. Recommendations will vary depending on your family history and other risks. Hepatitis C blood test. Hepatitis B blood test. Sexually transmitted disease (STD) testing. Diabetes screening. This is done by checking your blood sugar (glucose) after you have not eaten for a while (fasting). You may have this done every 1-3 years. Abdominal aortic aneurysm (AAA) screening. You may need this if you are a current or former smoker. Osteoporosis. You may be screened starting at age 81 if you are at high risk. Talk with your health care provider about your test results, treatment options, and if necessary, the need for more tests. Vaccines  Your health care provider may recommend certain vaccines, such as: Influenza vaccine. This is recommended every year. Tetanus, diphtheria, and acellular pertussis (Tdap, Td) vaccine. You may need a Td booster every 10 years. Zoster vaccine. You may need this after age 50. Pneumococcal 13-valent conjugate (PCV13) vaccine. One dose is recommended after age 44. Pneumococcal polysaccharide (PPSV23) vaccine. One dose is recommended after age 18. Talk to your health care provider about which  screenings and vaccines you need and how often you need them. This  information is not intended to replace advice given to you by your health care provider. Make sure you discuss any questions you have with your health care provider. Document Released: 01/21/2015 Document Revised: 09/14/2015 Document Reviewed: 10/26/2014 Elsevier Interactive Patient Education  2017 [place] Prevention in the Home Falls can cause injuries. They can happen to people of all ages. There are many things you can do to make your home safe and to help prevent falls. What can I do on the outside of my home? Regularly fix the edges of walkways and driveways and fix any cracks. Remove anything that might make you trip as you walk through a door, such as a raised step or threshold. Trim any bushes or trees on the path to your home. Use bright outdoor lighting. Clear any walking paths of anything that might make someone trip, such as rocks or tools. Regularly check to see if handrails are loose or broken. Make sure that both sides of any steps have handrails. Any raised decks and porches should have guardrails on the edges. Have any leaves, snow, or ice cleared regularly. Use sand or salt on walking paths during winter. Clean up any spills in your garage right away. This includes oil or grease spills. What can I do in the bathroom? Use night lights. Install grab bars by the toilet and in the tub and shower. Do not use towel bars as grab bars. Use non-skid mats or decals in the tub or shower. If you need to sit down in the shower, use a plastic, non-slip stool. Keep the floor dry. Clean up any water that spills on the floor as soon as it happens. Remove soap buildup in the tub or shower regularly. Attach bath mats securely with double-sided non-slip rug tape. Do not have throw rugs and other things on the floor that can make you trip. What can I do in the bedroom? Use night lights. Make sure that you have a light by your bed that is easy to reach. Do not use any sheets or  blankets that are too big for your bed. They should not hang down onto the floor. Have a firm chair that has side arms. You can use this for support while you get dressed. Do not have throw rugs and other things on the floor that can make you trip. What can I do in the kitchen? Clean up any spills right away. Avoid walking on wet floors. Keep items that you use a lot in easy-to-reach places. If you need to reach something above you, use a strong step stool that has a grab bar. Keep electrical cords out of the way. Do not use floor polish or wax that makes floors slippery. If you must use wax, use non-skid floor wax. Do not have throw rugs and other things on the floor that can make you trip. What can I do with my stairs? Do not leave any items on the stairs. Make sure that there are handrails on both sides of the stairs and use them. Fix handrails that are broken or loose. Make sure that handrails are as long as the stairways. Check any carpeting to make sure that it is firmly attached to the stairs. Fix any carpet that is loose or worn. Avoid having throw rugs at the top or bottom of the stairs. If you do have throw rugs, attach them to the floor with carpet  tape. Make sure that you have a light switch at the top of the stairs and the bottom of the stairs. If you do not have them, ask someone to add them for you. What else can I do to help prevent falls? Wear shoes that: Do not have high heels. Have rubber bottoms. Are comfortable and fit you well. Are closed at the toe. Do not wear sandals. If you use a stepladder: Make sure that it is fully opened. Do not climb a closed stepladder. Make sure that both sides of the stepladder are locked into place. Ask someone to hold it for you, if possible. Clearly mark and make sure that you can see: Any grab bars or handrails. First and last steps. Where the edge of each step is. Use tools that help you move around (mobility aids) if they are  needed. These include: Canes. Walkers. Scooters. Crutches. Turn on the lights when you go into a dark area. Replace any light bulbs as soon as they burn out. Set up your furniture so you have a clear path. Avoid moving your furniture around. If any of your floors are uneven, fix them. If there are any pets around you, be aware of where they are. Review your medicines with your doctor. Some medicines can make you feel dizzy. This can increase your chance of falling. Ask your doctor what other things that you can do to help prevent falls. This information is not intended to replace advice given to you by your health care provider. Make sure you discuss any questions you have with your health care provider. Document Released: 10/21/2008 Document Revised: 06/02/2015 Document Reviewed: 01/29/2014 Elsevier Interactive Patient Education  2017 [employer].

## 2020-12-16 NOTE — Progress Notes (Signed)
Subjective:   Brandon Rojas is a 75 y.o. male who presents for an Subsequent Medicare Annual Wellness Visit.  I connected with Manoj Enriquez today by telephone and verified that I am speaking with the correct person using two identifiers. Location patient: home Location provider: work Persons participating in the virtual visit: patient, provider.   I discussed the limitations, risks, security and privacy concerns of performing an evaluation and management service by telephone and the availability of in person appointments. I also discussed with the patient that there may be a patient responsible charge related to this service. The patient expressed understanding and verbally consented to this telephonic visit.    Interactive audio and video telecommunications were attempted between this provider and patient, however failed, due to patient having technical difficulties OR patient did not have access to video capability.  We continued and completed visit with audio only.    Review of Systems     Cardiac Risk Factors include: advanced age (>35men, >75 women);dyslipidemia;male gender;hypertension     Objective:    Today's Vitals   There is no height or weight on file to calculate BMI.  Advanced Directives 12/16/2020 07/12/2020 03/24/2020 03/01/2020 09/10/2019 08/28/2019 04/28/2019  Does Patient Have a Medical Advance Directive? Yes Yes No Yes Yes Yes Yes  Type of Paramedic of [place];Living will - [place];Living will;Out of facility DNR (pink MOST or yellow form) Living will;Healthcare Power of Attorney Living will;Healthcare Power of [place];Living will [place];Living will  Does patient want to make changes to medical advance directive? - - - No - Patient declined No - Patient declined - -  Copy of [place] in Chart? No - copy requested - - - No - copy requested - -  Would patient  like information on creating a medical advance directive? - - - - - - -    Current Medications (verified) Outpatient Encounter Medications as of 12/16/2020  Medication Sig   albuterol (VENTOLIN HFA) 108 (90 Base) MCG/ACT inhaler Inhale 1-2 puffs into the lungs every 6 (six) hours as needed for wheezing or shortness of breath.   apixaban (ELIQUIS) 5 MG TABS tablet TAKE 1 TABLET (5 MG TOTAL) BY MOUTH TWO TIMES DAILY.   aspirin 81 MG chewable tablet Chew 1 tablet by mouth daily.   B Complex Vitamins (VITAMIN B COMPLEX PO) Take 1 tablet by mouth as needed.   carbidopa-levodopa (SINEMET IR) 25-100 MG tablet 2 at 7 AM/1 at 11 AM/2 at 3 PM/1 tblet at 7 PM   divalproex (DEPAKOTE ER) 500 MG 24 hr tablet Take 1 tablet (500 mg total) by mouth daily.   esomeprazole (NEXIUM) 40 MG capsule Take 1 capsule (40 mg total) by mouth daily.   furosemide (LASIX) 40 MG tablet Take 1 tablet (40 mg total) by mouth daily.   lisinopril (ZESTRIL) 40 MG tablet Take 0.5 tablets by mouth daily.   lovastatin (MEVACOR) 20 MG tablet TAKE 1 TABLET AT BEDTIME   Multiple Vitamin (MULTIVITAMIN WITH MINERALS) TABS tablet Take 1 tablet by mouth daily.   nitroGLYCERIN (NITROSTAT) 0.4 MG SL tablet PLACE 1 TABLET (0.4 MG TOTAL) UNDER THE TONGUE EVERY 5 (FIVE) MINUTES AS NEEDED.   Polyethyl Glycol-Propyl Glycol (SYSTANE ULTRA) 0.4-0.3 % SOLN Apply to eye as directed.   traMADol (ULTRAM) 50 MG tablet TAKE 1 TABLET BY MOUTH EVERY 6 HOURS AS NEEDED FOR PAIN   No facility-administered encounter medications on file  as of 12/16/2020.    Allergies (verified) Atorvastatin, Simvastatin, and Statins   History: Past Medical History:  Diagnosis Date   Anxiety state, unspecified    Arthritis    Benign neoplasm of colon    CAD (coronary artery disease) of artery bypass graft 05/20/2018   Depressive disorder, not elsewhere classified    Diaphragmatic hernia without mention of obstruction or gangrene    Diverticulosis of colon (without  mention of hemorrhage)    Esophageal reflux    Esophageal stricture    Hiatal hernia    Mitral regurgitation    Osteoarthrosis, unspecified whether generalized or localized, unspecified site    Other and unspecified hyperlipidemia    Parkinson's disease ([place])    Personal history of colonic polyps    Stroke ([place])    Unspecified essential hypertension    Past Surgical History:  Procedure Laterality Date   CARDIOVERSION N/A 03/01/2020   Procedure: CARDIOVERSION;  Surgeon: Pixie Casino, MD;  Location: [facility];  Service: Cardiovascular;  Laterality: N/A;   COLONOSCOPY     CORONARY ARTERY BYPASS GRAFT     x 2   INGUINAL HERNIA REPAIR     bilateral   IR EXCHANGE BILIARY DRAIN  11/06/2018   IR PERC CHOLECYSTOSTOMY  05/22/2018   IR RADIOLOGIST EVAL & MGMT  06/24/2018   IR RADIOLOGIST EVAL & MGMT  07/01/2018   LUNG BIOPSY     POLYPECTOMY     PTCA     TEE WITHOUT CARDIOVERSION N/A 03/01/2020   Procedure: TRANSESOPHAGEAL ECHOCARDIOGRAM (TEE);  Surgeon: Pixie Casino, MD;  Location[facility] ENDOSCOPY;  Service: Cardiovascular;  Laterality: N/A;   Family History  Problem Relation Age of Onset   Heart attack Mother    Stroke Father    Colon cancer Sister    Healthy Sister    Heart disease Brother    Healthy Brother    Healthy Brother    Healthy Brother    Healthy Daughter    Fibromyalgia Daughter    Multiple sclerosis Son    Social History   Socioeconomic History   Marital status: Married    Spouse name: Not on file   Number of children: 3   Years of education: Not on file   Highest education level: Doctorate  Occupational History   Occupation: PASTOR    Employer: [employer]  Tobacco Use   Smoking status: Former    Types: Cigarettes    Quit date: 02/19/1972    Years since quitting: 48.8   Smokeless tobacco: Never  Vaping Use   Vaping Use: Never used  Substance and Sexual Activity   Alcohol use: No    Alcohol/week: 0.0 standard drinks   Drug use: No    Sexual activity: Not on file  Other Topics Concern   Not on file  Social History Narrative   Right handed    Lives in one story home with a basement that he does not use.   Social Determinants of Health   Financial Resource Strain: Low Risk    Difficulty of Paying Living Expenses: Not hard at all  Food Insecurity: No Food Insecurity   Worried About Charity fundraiser in the Last Year: Never true   [place] in the Last Year: Never true  Transportation Needs: No Transportation Needs   Lack of Transportation (Medical): No   Lack of Transportation (Non-Medical): No  Physical Activity: Inactive   Days of Exercise per Week: 0 days  Minutes of Exercise per Session: 0 min  Stress: No Stress Concern Present   Feeling of Stress : Not at all  Social Connections: Moderately Integrated   Frequency of Communication with Friends and Family: Twice a week   Frequency of Social Gatherings with Friends and Family: Twice a week   Attends Religious Services: 1 to 4 times per year   Active Member of [employer] or Organizations: No   Attends Music therapist: Never   Marital Status: Married    Tobacco Counseling Counseling given: Not Answered   Clinical Intake:  Pre-visit preparation completed: Yes  Pain : No/denies pain     Nutritional Risks: Unintentional weight loss (parkinisons) Diabetes: No  How often do you need to have someone help you when you read instructions, pamphlets, or other written materials from your doctor or pharmacy?: 1 - Never What is the last grade level you completed in school?: BS  Diabetic?no   Interpreter Needed?: No  Information entered by :: [place] of Daily Living In your present state of health, do you have any difficulty performing the following activities: 12/16/2020 03/01/2020  Hearing? N N  Vision? N N  Difficulty concentrating or making decisions? N Y  Walking or climbing stairs? N N  Dressing or bathing? N Y   Doing errands, shopping? N Y  Conservation officer, nature and eating ? N -  Using the Toilet? N -  In the past six months, have you accidently leaked urine? N -  Do you have problems with loss of bowel control? N -  Managing your Medications? N -  Managing your Finances? N -  Housekeeping or managing your Housekeeping? N -  Some recent data might be hidden    Patient Care Team: Biagio Borg, MD as PCP - General Minus Breeding, MD as PCP - Cardiology (Cardiology)  Indicate any recent Medical Services you may have received from other than [facility] providers in the past year (date may be approximate).     Assessment:   This is a routine wellness examination for Jax.  Hearing/Vision screen Vision Screening - Comments:: Annual eye exams wears glasses   Dietary issues and exercise activities discussed: Current Exercise Habits: The patient does not participate in regular exercise at present, Exercise limited by: neurologic condition(s)   Goals Addressed   None    Depression Screen PHQ 2/9 Scores 12/16/2020 12/16/2020 10/26/2020 04/11/2020 09/10/2019 09/10/2019 03/09/2019  PHQ - 2 Score 0 0 0 0 1 1 1   PHQ- 9 Score - - - - - 1 -    Fall Risk Fall Risk  12/16/2020 10/26/2020 07/12/2020 04/11/2020 03/24/2020  Falls in the past year? 0 1 0 0 0  Number falls in past yr: 0 0 0 - 0  Injury with Fall? 0 0 0 - 0  Risk for fall due to : (No Data) - - - -  Risk for fall due to: Comment cane/walker - - - -  Follow up Falls evaluation completed - - - -    FALL RISK PREVENTION PERTAINING TO THE HOME:  Any stairs in or around the home? Yes  If so, are there any without handrails? No  Home free of loose throw rugs in walkways, pet beds, electrical cords, etc? No  Adequate lighting in your home to reduce risk of falls? No   ASSISTIVE DEVICES UTILIZED TO PREVENT FALLS:  Life alert? No  Use of a cane, walker or w/c? Yes  Grab bars in the  bathroom? Yes  Shower chair or bench in shower? Yes  Elevated toilet seat  or a handicapped toilet? Yes     Cognitive Function:    Normal cognitive status assessed by direct observation by this Nurse Health Advisor. No abnormalities found.      Immunizations Immunization History  Administered Date(s) Administered   Fluad Quad(high Dose 65+) 09/04/2018   Influenza, High Dose Seasonal PF 12/09/2012, 01/19/2016, 11/12/2017   Influenza,inj,Quad PF,6+ Mos 01/15/2014, 10/21/2014   Pneumococcal Conjugate-13 12/23/2012   Pneumococcal Polysaccharide-23 12/08/2009   Td 01/04/2009   Tdap 09/10/2019    TDAP status: Up to date  Flu Vaccine status: Due, Education has been provided regarding the importance of this vaccine. Advised may receive this vaccine at local pharmacy or Health Dept. Aware to provide a copy of the vaccination record if obtained from local pharmacy or Health Dept. Verbalized acceptance and understanding.  Pneumococcal vaccine status: Up to date  Covid-19 vaccine status: Completed vaccines  Qualifies for Shingles Vaccine? Yes   Zostavax completed Yes   Shingrix Completed?: No.    Education has been provided regarding the importance of this vaccine. Patient has been advised to call insurance company to determine out of pocket expense if they have not yet received this vaccine. Advised may also receive vaccine at local pharmacy or Health Dept. Verbalized acceptance and understanding.  Screening Tests Health Maintenance  Topic Date Due   HEMOGLOBIN A1C  10/11/2020   OPHTHALMOLOGY EXAM  12/13/2020   Zoster Vaccines- Shingrix (1 of 2) 12/20/2020 (Originally 05/16/1964)   INFLUENZA VACCINE  04/07/2021 (Originally 08/08/2020)   COLONOSCOPY (Pts 45-3yrs Insurance coverage will need to be confirmed)  04/11/2021 (Originally 02/07/2017)   FOOT EXAM  10/26/2021   TETANUS/TDAP  09/09/2029   Pneumonia Vaccine 59+ Years old  Completed   Hepatitis C Screening  Completed   HPV VACCINES  Aged Out   COVID-19 Vaccine  Discontinued    Health  Maintenance  Health Maintenance Due  Topic Date Due   HEMOGLOBIN A1C  10/11/2020   OPHTHALMOLOGY EXAM  12/13/2020    Colorectal cancer screening: No longer required.   Lung Cancer Screening: (Low Dose CT Chest recommended if Age 69-80 years, 30 pack-year currently smoking OR have quit w/in 15years.) does not qualify.   Lung Cancer Screening Referral: n/a  Additional Screening:  Hepatitis C Screening: does not qualify; Completed 01/25/2015  Vision Screening: Recommended annual ophthalmology exams for early detection of glaucoma and other disorders of the eye. Is the patient up to date with their annual eye exam?  Yes  Who is the provider or what is the name of the office in which the patient attends annual eye exams? Dr.Snipes  If pt is not established with a provider, would they like to be referred to a provider to establish care? No .   Dental Screening: Recommended annual dental exams for proper oral hygiene  Community Resource Referral / Chronic Care Management: CRR required this visit?  No   CCM required this visit?  No      Plan:     I have personally reviewed and noted the following in the patient's chart:   Medical and social history Use of alcohol, tobacco or illicit drugs  Current medications and supplements including opioid prescriptions. Patient is currently taking opioid prescriptions. Information provided to patient regarding non-opioid alternatives. Patient advised to discuss non-opioid treatment plan with their provider. Functional ability and status Nutritional status Physical activity Advanced directives List of other physicians Hospitalizations, surgeries,  and ER visits in previous 12 months Vitals Screenings to include cognitive, depression, and falls Referrals and appointments  In addition, I have reviewed and discussed with patient certain preventive protocols, quality metrics, and best practice recommendations. A written personalized care plan  for preventive services as well as general preventive health recommendations were provided to patient.     Randel Pigg, LPN   [id number]   Nurse Notes: none

## 2020-12-20 ENCOUNTER — Encounter: Payer: Self-pay | Admitting: Neurology

## 2020-12-22 ENCOUNTER — Other Ambulatory Visit: Payer: Self-pay

## 2020-12-22 ENCOUNTER — Encounter: Payer: Self-pay | Admitting: Internal Medicine

## 2020-12-22 ENCOUNTER — Ambulatory Visit (INDEPENDENT_AMBULATORY_CARE_PROVIDER_SITE_OTHER): Payer: Medicare HMO | Admitting: Internal Medicine

## 2020-12-22 ENCOUNTER — Telehealth: Payer: Self-pay

## 2020-12-22 ENCOUNTER — Other Ambulatory Visit (INDEPENDENT_AMBULATORY_CARE_PROVIDER_SITE_OTHER): Payer: Medicare HMO

## 2020-12-22 VITALS — BP 100/60 | HR 49 | Temp 97.8°F | Ht 64.0 in | Wt 134.0 lb

## 2020-12-22 DIAGNOSIS — I5031 Acute diastolic (congestive) heart failure: Secondary | ICD-10-CM | POA: Diagnosis not present

## 2020-12-22 DIAGNOSIS — I959 Hypotension, unspecified: Secondary | ICD-10-CM

## 2020-12-22 DIAGNOSIS — R001 Bradycardia, unspecified: Secondary | ICD-10-CM

## 2020-12-22 DIAGNOSIS — I5043 Acute on chronic combined systolic (congestive) and diastolic (congestive) heart failure: Secondary | ICD-10-CM

## 2020-12-22 LAB — BASIC METABOLIC PANEL
BUN: 28 mg/dL — ABNORMAL HIGH (ref 6–23)
CO2: 29 mEq/L (ref 19–32)
Calcium: 9.6 mg/dL (ref 8.4–10.5)
Chloride: 103 mEq/L (ref 96–112)
Creatinine, Ser: 1.79 mg/dL — ABNORMAL HIGH (ref 0.40–1.50)
GFR: 36.33 mL/min — ABNORMAL LOW (ref >60.00)
Glucose, Bld: 83 mg/dL (ref 70–99)
Potassium: 3.9 mEq/L (ref 3.5–5.1)
Sodium: 142 mEq/L (ref 135–145)

## 2020-12-22 NOTE — Telephone Encounter (Signed)
-----   Message from Biagio Borg, MD sent at 12/22/2020  1:29 PM EST ----- Regarding: eliquis pt assist program Please to assist if possible for this probram, thanks

## 2020-12-22 NOTE — Telephone Encounter (Signed)
Spoke with patient's wife,   Explained Eliquis PAP requirements - to be approved must have spent 3% of annual household income on prescriptions.  Also made patient aware of jansen select program where patient would be able to get a 30 day supply of Xarelto for $85 or 90 day supply for $200  Wife is going to check status of OOP expenses for 2022 and reach back out to office to discuss further   Tomasa Blase, PharmD Clinical Pharmacist, [facility]

## 2020-12-22 NOTE — Patient Instructions (Signed)
Due to the low BP and pulse, please STOP the lisinopril  Please check the BP and pulse at least once per day  HOLD the lasix (furosemide) for 3 days, then restart at 20mg  only (half pill)  Please continue all other medications as before, and refills have been done if requested.  Please have the pharmacy call with any other refills you may need.  Please continue your efforts at being more active, low cholesterol diet, and weight control.  Please keep your appointments with your specialists as you may have planned - cardiology in Feb 2023  Please go to the LAB at the blood drawing area for the tests to be done - just the kidney function test today  You will be contacted by phone if any changes need to be made immediately.  Otherwise, you will receive a letter about your results with an explanation, but please check with MyChart first.  Please remember to sign up for MyChart if you have not done so, as this will be important to you in the future with finding out test results, communicating by private email, and scheduling acute appointments online when needed.  Please make an Appointment to return in 2 weeks, or sooner if needed

## 2020-12-22 NOTE — Progress Notes (Signed)
Patient ID: LADARIEN Rojas, male   DOB: [birth year], 75 y.o.   MRN: [id number]        Chief Complaint: follow up low BP, low HR, recent mild volume increase       HPI:  Brandon Rojas is a 75 y.o. male here 1 wk post recent visit with mild elevated volume, started lasix 40 qd and pt and wife report large diuresis effect it seems, now with mild worsening generalzied weakness, intermittent dizziness, but Pt denies chest pain, increased sob or doe, wheezing, orthopnea, PND, increased LE swelling, palpitations, or syncope.  Wt down 4 lbs.  HR is noted mild low today as well.   Wt Readings from Last 3 Encounters:  12/22/20 134 lb (60.8 kg)  12/13/20 138 lb (62.6 kg)  11/30/20 134 lb 9.6 oz (61.1 kg)   BP Readings from Last 3 Encounters:  12/22/20 100/60  12/13/20 110/60  11/30/20 118/72         Past Medical History:  Diagnosis Date   Anxiety state, unspecified    Arthritis    Benign neoplasm of colon    CAD (coronary artery disease) of artery bypass graft 05/20/2018   Depressive disorder, not elsewhere classified    Diaphragmatic hernia without mention of obstruction or gangrene    Diverticulosis of colon (without mention of hemorrhage)    Esophageal reflux    Esophageal stricture    Hiatal hernia    Mitral regurgitation    Osteoarthrosis, unspecified whether generalized or localized, unspecified site    Other and unspecified hyperlipidemia    Parkinson's disease ([place])    Personal history of colonic polyps    Stroke ([place])    Unspecified essential hypertension    Past Surgical History:  Procedure Laterality Date   CARDIOVERSION N/A 03/01/2020   Procedure: CARDIOVERSION;  Surgeon: Pixie Casino, MD;  Location: [facility];  Service: Cardiovascular;  Laterality: N/A;   COLONOSCOPY     CORONARY ARTERY BYPASS GRAFT     x 2   INGUINAL HERNIA REPAIR     bilateral   IR EXCHANGE BILIARY DRAIN  11/06/2018   IR PERC CHOLECYSTOSTOMY  05/22/2018   IR RADIOLOGIST EVAL & MGMT  06/24/2018   IR  RADIOLOGIST EVAL & MGMT  07/01/2018   LUNG BIOPSY     POLYPECTOMY     PTCA     TEE WITHOUT CARDIOVERSION N/A 03/01/2020   Procedure: TRANSESOPHAGEAL ECHOCARDIOGRAM (TEE);  Surgeon: Pixie Casino, MD;  Location: [facility] ENDOSCOPY;  Service: Cardiovascular;  Laterality: N/A;    reports that he quit smoking about 48 years ago. His smoking use included cigarettes. He has never used smokeless tobacco. He reports that he does not drink alcohol and does not use drugs. family history includes Colon cancer in his sister; Fibromyalgia in his daughter; Healthy in his brother, brother, brother, daughter, and sister; Heart attack in his mother; Heart disease in his brother; Multiple sclerosis in his son; Stroke in his father. Allergies  Allergen Reactions   Atorvastatin Other (See Comments)    Muscle cramps   Simvastatin Other (See Comments)    Muscle cramps   Statins Hives    pts wife states its two statin medications he is allergic to   Current Outpatient Medications on File Prior to Visit  Medication Sig Dispense Refill   albuterol (VENTOLIN HFA) 108 (90 Base) MCG/ACT inhaler Inhale 1-2 puffs into the lungs every 6 (six) hours as needed for wheezing or shortness of breath. 18 g 4  apixaban (ELIQUIS) 5 MG TABS tablet TAKE 1 TABLET (5 MG TOTAL) BY MOUTH TWO TIMES DAILY. 180 tablet 3   aspirin 81 MG chewable tablet Chew 1 tablet by mouth daily.     B Complex Vitamins (VITAMIN B COMPLEX PO) Take 1 tablet by mouth as needed.     carbidopa-levodopa (SINEMET IR) 25-100 MG tablet 2 at 7 AM/1 at 11 AM/2 at 3 PM/1 tblet at 7 PM 540 tablet 2   divalproex (DEPAKOTE ER) 500 MG 24 hr tablet Take 1 tablet (500 mg total) by mouth daily. 30 tablet 3   esomeprazole (NEXIUM) 40 MG capsule Take 1 capsule (40 mg total) by mouth daily. 90 capsule 3   furosemide (LASIX) 40 MG tablet Take 1 tablet (40 mg total) by mouth daily. 30 tablet 3   lisinopril (ZESTRIL) 40 MG tablet Take 0.5 tablets by mouth daily.     lovastatin  (MEVACOR) 20 MG tablet TAKE 1 TABLET AT BEDTIME 90 tablet 1   Multiple Vitamin (MULTIVITAMIN WITH MINERALS) TABS tablet Take 1 tablet by mouth daily.     nitroGLYCERIN (NITROSTAT) 0.4 MG SL tablet PLACE 1 TABLET (0.4 MG TOTAL) UNDER THE TONGUE EVERY 5 (FIVE) MINUTES AS NEEDED. 50 tablet 3   Polyethyl Glycol-Propyl Glycol (SYSTANE ULTRA) 0.4-0.3 % SOLN Apply to eye as directed.     traMADol (ULTRAM) 50 MG tablet TAKE 1 TABLET BY MOUTH EVERY 6 HOURS AS NEEDED FOR PAIN 120 tablet 5   No current facility-administered medications on file prior to visit.        ROS:  All others reviewed and negative.  Objective        PE:  BP 100/60 (BP Location: Right Arm, Patient Position: Sitting, Cuff Size: Normal)    Pulse (!) 49    Temp 97.8 F (36.6 C) (Oral)    Ht 5\' 4"  (1.626 m)    Wt 134 lb (60.8 kg)    SpO2 99%    BMI 23.00 kg/m                 Constitutional: Pt appears in NAD               HENT: Head: NCAT.                Right Ear: External ear normal.                 Left Ear: External ear normal.                Eyes: . Pupils are equal, round, and reactive to light. Conjunctivae and EOM are normal               Nose: without d/c or deformity               Neck: Neck supple. Gross normal ROM               Cardiovascular: Normal rate and regular rhythm.                 Pulmonary/Chest: Effort normal and breath sounds without rales or wheezing.                Abd:  Soft, NT, ND, + BS, no organomegaly               Neurological: Pt is alert. At baseline orientation, motor grossly intact               Skin: Skin is  warm. No rashes, no other new lesions, LE edema - none               Psychiatric: Pt behavior is normal without agitation   Micro: none  Cardiac tracings I have personally interpreted today:  none  Pertinent Radiological findings (summarize): none   Lab Results  Component Value Date   WBC 6.1 04/11/2020   HGB 12.0 (L) 04/11/2020   HCT 37.3 (L) 04/11/2020   PLT 200.0 04/11/2020    GLUCOSE 83 12/22/2020   CHOL 113 04/11/2020   TRIG 118.0 04/11/2020   HDL 51.10 04/11/2020   LDLCALC 38 04/11/2020   ALT 4 04/11/2020   AST 19 04/11/2020   NA 142 12/22/2020   K 3.9 12/22/2020   CL 103 12/22/2020   CREATININE 1.79 (H) 12/22/2020   BUN 28 (H) 12/22/2020   CO2 29 12/22/2020   TSH 4.470 02/26/2020   PSA 1.23 09/04/2018   INR 2.2 (H) 03/01/2020   HGBA1C 5.6 04/11/2020   Assessment/Plan:  KHANG HANNUM is a 75 y.o. White or Caucasian [1] male with  has a past medical history of Anxiety state, unspecified, Arthritis, Benign neoplasm of colon, CAD (coronary artery disease) of artery bypass graft (05/20/2018), Depressive disorder, not elsewhere classified, Diaphragmatic hernia without mention of obstruction or gangrene, Diverticulosis of colon (without mention of hemorrhage), Esophageal reflux, Esophageal stricture, Hiatal hernia, Mitral regurgitation, Osteoarthrosis, unspecified whether generalized or localized, unspecified site, Other and unspecified hyperlipidemia, Parkinson's disease ([place]), Personal history of colonic polyps, Stroke ([place]), and Unspecified essential hypertension.  Acute diastolic CHF (congestive heart failure) (HCC) Mild increased volume resolved, in fact today suspect is mild overdiuresed, will check bmp but will hold lasix for 3 days, then restart at 20 qd only, f/u 2 wks  Bradycardia Mild, declines cardiology for now, cont to monitor closely  Hypotension Mild, likely related to overdiuresis, but will need d/c lisinopril for now as well  Followup: Return in about 2 weeks (around 01/05/2021).  [name], MD 12/25/2020 7:33 PM [facility]

## 2020-12-22 NOTE — Telephone Encounter (Signed)
Called and left message to discuss options regarding eliquis copay, patient to call back to further discuss  Tomasa Blase, PharmD Clinical Pharmacist, [place]

## 2020-12-25 ENCOUNTER — Encounter: Payer: Self-pay | Admitting: Internal Medicine

## 2020-12-25 DIAGNOSIS — I959 Hypotension, unspecified: Secondary | ICD-10-CM | POA: Insufficient documentation

## 2020-12-25 NOTE — Assessment & Plan Note (Signed)
Mild, likely related to overdiuresis, but will need d/c lisinopril for now as well

## 2020-12-25 NOTE — Assessment & Plan Note (Signed)
Mild increased volume resolved, in fact today suspect is mild overdiuresed, will check bmp but will hold lasix for 3 days, then restart at 20 qd only, f/u 2 wks

## 2020-12-25 NOTE — Assessment & Plan Note (Signed)
Mild, declines cardiology for now, cont to monitor closely

## 2020-12-26 ENCOUNTER — Encounter: Payer: Self-pay | Admitting: Internal Medicine

## 2020-12-28 NOTE — Telephone Encounter (Signed)
Ok I think that should be OK for now to stay off, thanks

## 2020-12-29 ENCOUNTER — Encounter: Payer: Self-pay | Admitting: Internal Medicine

## 2020-12-29 NOTE — Telephone Encounter (Signed)
As this is new problem and could even be pneumonia, pt should be seen at [facility] or UC or ED

## 2020-12-29 NOTE — Telephone Encounter (Signed)
Patients wife notified

## 2020-12-30 ENCOUNTER — Emergency Department (HOSPITAL_COMMUNITY)
Admission: EM | Admit: 2020-12-30 | Discharge: 2020-12-30 | Disposition: A | Discharge status: Home / Self Care | Payer: Medicare HMO | Attending: Emergency Medicine | Admitting: Emergency Medicine

## 2020-12-30 ENCOUNTER — Emergency Department (HOSPITAL_COMMUNITY): Payer: Medicare HMO

## 2020-12-30 DIAGNOSIS — R062 Wheezing: Secondary | ICD-10-CM | POA: Diagnosis not present

## 2020-12-30 DIAGNOSIS — I5043 Acute on chronic combined systolic (congestive) and diastolic (congestive) heart failure: Secondary | ICD-10-CM | POA: Insufficient documentation

## 2020-12-30 DIAGNOSIS — R0602 Shortness of breath: Secondary | ICD-10-CM | POA: Diagnosis not present

## 2020-12-30 DIAGNOSIS — Z87891 Personal history of nicotine dependence: Secondary | ICD-10-CM | POA: Diagnosis not present

## 2020-12-30 DIAGNOSIS — Z7901 Long term (current) use of anticoagulants: Secondary | ICD-10-CM | POA: Insufficient documentation

## 2020-12-30 DIAGNOSIS — E119 Type 2 diabetes mellitus without complications: Secondary | ICD-10-CM | POA: Diagnosis not present

## 2020-12-30 DIAGNOSIS — I5020 Unspecified systolic (congestive) heart failure: Secondary | ICD-10-CM | POA: Diagnosis not present

## 2020-12-30 DIAGNOSIS — I517 Cardiomegaly: Secondary | ICD-10-CM | POA: Diagnosis not present

## 2020-12-30 DIAGNOSIS — Z20822 Contact with and (suspected) exposure to covid-19: Secondary | ICD-10-CM | POA: Insufficient documentation

## 2020-12-30 DIAGNOSIS — R0600 Dyspnea, unspecified: Secondary | ICD-10-CM | POA: Diagnosis not present

## 2020-12-30 DIAGNOSIS — Z7982 Long term (current) use of aspirin: Secondary | ICD-10-CM | POA: Insufficient documentation

## 2020-12-30 DIAGNOSIS — I509 Heart failure, unspecified: Secondary | ICD-10-CM | POA: Diagnosis not present

## 2020-12-30 DIAGNOSIS — G20 Parkinson's disease: Secondary | ICD-10-CM | POA: Insufficient documentation

## 2020-12-30 DIAGNOSIS — I110 Hypertensive heart disease with heart failure: Secondary | ICD-10-CM | POA: Diagnosis not present

## 2020-12-30 DIAGNOSIS — Z951 Presence of aortocoronary bypass graft: Secondary | ICD-10-CM | POA: Insufficient documentation

## 2020-12-30 DIAGNOSIS — I2510 Atherosclerotic heart disease of native coronary artery without angina pectoris: Secondary | ICD-10-CM | POA: Insufficient documentation

## 2020-12-30 LAB — BASIC METABOLIC PANEL
Anion gap: 8 (ref 5–15)
BUN: 18 mg/dL (ref 8–23)
CO2: 25 mmol/L (ref 22–32)
Calcium: 9.2 mg/dL (ref 8.9–10.3)
Chloride: 109 mmol/L (ref 98–111)
Creatinine, Ser: 1.41 mg/dL — ABNORMAL HIGH (ref 0.61–1.24)
GFR, Estimated: 52 mL/min — ABNORMAL LOW (ref >60)
Glucose, Bld: 114 mg/dL — ABNORMAL HIGH (ref 70–99)
Potassium: 3.9 mmol/L (ref 3.5–5.1)
Sodium: 142 mmol/L (ref 135–145)

## 2020-12-30 LAB — CBC
HCT: 37.9 % — ABNORMAL LOW (ref 39.0–52.0)
Hemoglobin: 12.1 g/dL — ABNORMAL LOW (ref 13.0–17.0)
MCH: 30 pg (ref 26.0–34.0)
MCHC: 31.9 g/dL (ref 30.0–36.0)
MCV: 94 fL (ref 80.0–100.0)
Platelets: 177 10*3/uL (ref 150–400)
RBC: 4.03 MIL/uL — ABNORMAL LOW (ref 4.22–5.81)
RDW: 14.7 % (ref 11.5–15.5)
WBC: 9 10*3/uL (ref 4.0–10.5)
nRBC: 0 % (ref 0.0–0.2)

## 2020-12-30 LAB — RESP PANEL BY RT-PCR (FLU A&B, COVID) ARPGX2
Influenza A by PCR: NEGATIVE
Influenza B by PCR: NEGATIVE
SARS Coronavirus 2 by RT PCR: NEGATIVE

## 2020-12-30 LAB — BRAIN NATRIURETIC PEPTIDE: B Natriuretic Peptide: 491.4 pg/mL — ABNORMAL HIGH (ref 0.0–100.0)

## 2020-12-30 LAB — MAGNESIUM: Magnesium: 1.9 mg/dL (ref 1.7–2.4)

## 2020-12-30 MED ORDER — METHYLPREDNISOLONE SODIUM SUCC 125 MG IJ SOLR
125.0000 mg | Freq: Once | INTRAMUSCULAR | Status: AC
  Administered 2020-12-30: 16:00:00 125 mg via INTRAVENOUS
  Filled 2020-12-30: qty 2

## 2020-12-30 MED ORDER — FUROSEMIDE 10 MG/ML IJ SOLN
60.0000 mg | Freq: Once | INTRAMUSCULAR | Status: AC
  Administered 2020-12-30: 16:00:00 60 mg via INTRAVENOUS
  Filled 2020-12-30: qty 6

## 2020-12-30 MED ORDER — IPRATROPIUM-ALBUTEROL 0.5-2.5 (3) MG/3ML IN SOLN
3.0000 mL | Freq: Once | RESPIRATORY_TRACT | Status: AC
  Administered 2020-12-30: 16:00:00 3 mL via RESPIRATORY_TRACT
  Filled 2020-12-30: qty 3

## 2020-12-30 MED ORDER — IPRATROPIUM-ALBUTEROL 0.5-2.5 (3) MG/3ML IN SOLN
3.0000 mL | Freq: Once | RESPIRATORY_TRACT | Status: AC
  Administered 2020-12-30: 17:00:00 3 mL via RESPIRATORY_TRACT
  Filled 2020-12-30: qty 3

## 2020-12-30 MED ORDER — POTASSIUM CHLORIDE CRYS ER 20 MEQ PO TBCR
30.0000 meq | EXTENDED_RELEASE_TABLET | Freq: Once | ORAL | Status: AC
  Administered 2020-12-30: 16:00:00 30 meq via ORAL
  Filled 2020-12-30: qty 1

## 2020-12-30 MED ORDER — CARBIDOPA-LEVODOPA 25-100 MG PO TABS
1.0000 | ORAL_TABLET | Freq: Three times a day (TID) | ORAL | Status: DC
  Administered 2020-12-30: 17:00:00 1 via ORAL
  Filled 2020-12-30: qty 1

## 2020-12-30 NOTE — ED Notes (Signed)
This RN called ED pharmacy about verifying this pt's home meds; so pt can start taking his home dose of his Parkinson's medication.

## 2020-12-30 NOTE — ED Provider Notes (Signed)
[facility] EMERGENCY DEPARTMENT Provider Note   CSN: [id number] Arrival date & time: 12/30/20  0944     History Chief Complaint  Patient presents with   Shortness of Breath   Cough    Brandon Rojas. is a 75 y.o. male with history of coronary artery disease status post CABG, Parkinson's, stroke, and CHF who presents to the ED today with constant worsening shortness of breath and wheezing that has been ongoing the last month but worst over the last week. Patient was seen and evaluated by his PCP earlier this month for similar symptoms. He was trialed Lasix which improved his shortness of breath. He was then taken off Lasix as he was thought to be euvolemic. Since he stopped his shortness of breath has gotten worse. Patient was also given a albuterol inhaler which seems to help as well. Patient reports associated cough characterized as productive with yellow sputum but denies any sore throat, chest pain, abdominal pain, nasal congestions, nausea, vomiting, diarrhea, fever, chills, urinary complaints. His shortness of breath is worse with exertion. No orthopnea or PND.  Old records reviewed revealed an EF of 60% with normal LV function on his TEE back in February.    Shortness of Breath Associated symptoms: cough   Cough Associated symptoms: shortness of breath       Past Medical History:  Diagnosis Date   Anxiety state, unspecified    Arthritis    Benign neoplasm of colon    CAD (coronary artery disease) of artery bypass graft 05/20/2018   Depressive disorder, not elsewhere classified    Diaphragmatic hernia without mention of obstruction or gangrene    Diverticulosis of colon (without mention of hemorrhage)    Esophageal reflux    Esophageal stricture    Hiatal hernia    Mitral regurgitation    Osteoarthrosis, unspecified whether generalized or localized, unspecified site    Other and unspecified hyperlipidemia    Parkinson's disease ([place])    Personal  history of colonic polyps    Stroke [facility])    Unspecified essential hypertension     Patient Active Problem List   Diagnosis Date Noted   Hypotension 12/25/2020   Acute on chronic combined systolic and diastolic congestive heart failure ([place]) 12/13/2020   Weakness on left side of face 10/26/2020   Bradycardia 10/26/2020   Wheezing 10/26/2020   Stroke, lacunar ([place]) 09/20/2020   Aortic atherosclerosis ([place]) 08/12/2020   Inguinal hernia with bowel obstruction 06/04/2020   Restrictive lung disease 04/11/2020   Chronic anticoagulation 04/11/2020   Acute renal failure superimposed on chronic kidney disease ([place]) 04/11/2020   Leg swelling 35/46/5681   Acute diastolic CHF (congestive heart failure) (H[facility])    Atrial fibrillation, new onset ([place]) 02/26/2020   Dyspnea 01/15/2020   Type 2 diabetes mellitus with complication, without long-term current use of insulin ([place]) 06/30/2019   Educated about COVID-19 virus infection 06/30/2019   TIA (transient ischemic attack) 08/26/2018   Nocturia 05/30/2018   Elevated troponin 05/20/2018   Epigastric pain 05/20/2018   CAD (coronary artery disease) of artery bypass graft 05/20/2018   Acute cholecystitis 05/20/2018   Subacute left lumbar radiculopathy 06/23/2015   Mitral regurgitation 09/17/2014   Parkinson's disease ([place]) 08/19/2014   Pulmonary hypertension ([place]) 03/30/2014   Piriformis syndrome of left side 03/02/2014   Peripheral edema 01/15/2014   Movement disorder 01/15/2014   Hypercalcemia 01/15/2014   Left hip pain 01/15/2014   Hoarseness 01/15/2014   Back pain 06/08/2010  Encounter for long-term (current) use of high-risk medication 06/08/2010   ED (erectile dysfunction) 06/08/2010   Encounter for well adult exam with abnormal findings 06/07/2010   SHOULDER PAIN, LEFT 08/30/2009   CAROTID ARTERY DISEASE 01/25/2009   BRADYCARDIA 10/08/2007   COLONIC POLYPS 03/20/2007   HIATAL HERNIA 03/20/2007   DIVERTICULOSIS, COLON 03/20/2007    Hyperlipidemia 09/19/2006   OBESITY 09/19/2006   Anxiety state 09/19/2006   ERECTILE DYSFUNCTION 09/19/2006   Depression 09/19/2006   Essential hypertension 09/19/2006   Coronary atherosclerosis 09/19/2006   ALLERGIC RHINITIS 09/19/2006   GERD 09/19/2006   OSTEOARTHRITIS 09/19/2006   History of TIA (transient ischemic attack) 09/19/2006   PSORIASIS, HX OF 09/19/2006    Past Surgical History:  Procedure Laterality Date   CARDIOVERSION N/A 03/01/2020   Procedure: CARDIOVERSION;  Surgeon: Pixie Casino, MD;  Location: [facility];  Service: Cardiovascular;  Laterality: N/A;   COLONOSCOPY     CORONARY ARTERY BYPASS GRAFT     x 2   INGUINAL HERNIA REPAIR     bilateral   IR EXCHANGE BILIARY DRAIN  11/06/2018   IR PERC CHOLECYSTOSTOMY  05/22/2018   IR RADIOLOGIST EVAL & MGMT  06/24/2018   IR RADIOLOGIST EVAL & MGMT  07/01/2018   LUNG BIOPSY     POLYPECTOMY     PTCA     TEE WITHOUT CARDIOVERSION N/A 03/01/2020   Procedure: TRANSESOPHAGEAL ECHOCARDIOGRAM (TEE);  Surgeon: Pixie Casino, MD;  [facility] ENDOSCOPY;  Service: Cardiovascular;  Laterality: N/A;       Family History  Problem Relation Age of Onset   Heart attack Mother    Stroke Father    Colon cancer Sister    Healthy Sister    Heart disease Brother    Healthy Brother    Healthy Brother    Healthy Brother    Healthy Daughter    Fibromyalgia Daughter    Multiple sclerosis Son     Social History   Tobacco Use   Smoking status: Former    Types: Cigarettes    Quit date: 02/19/1972    Years since quitting: 48.8   Smokeless tobacco: Never  Vaping Use   Vaping Use: Never used  Substance Use Topics   Alcohol use: No    Alcohol/week: 0.0 standard drinks   Drug use: No    Home Medications Prior to Admission medications   Medication Sig Start Date End Date Taking? Authorizing Provider  albuterol (VENTOLIN HFA) 108 (90 Base) MCG/ACT inhaler Inhale 1-2 puffs into the lungs every 6 (six) hours as needed for  wheezing or shortness of breath. 04/11/20  Yes Biagio Borg, MD  apixaban (ELIQUIS) 5 MG TABS tablet TAKE 1 TABLET (5 MG TOTAL) BY MOUTH TWO TIMES DAILY. 04/11/20 04/11/21 Yes Biagio Borg, MD  aspirin 81 MG chewable tablet Chew 1 tablet by mouth daily. 09/13/20  Yes [provider]  B Complex Vitamins (VITAMIN B COMPLEX PO) Take 1 tablet by mouth as needed.   Yes [provider]  carbidopa-levodopa (SINEMET IR) 25-100 MG tablet 2 at 7 AM/1 at 11 AM/2 at 3 PM/1 tblet at 7 PM Patient taking differently: Take 1 tablet by mouth See admin instructions. Take 2 tablets by mouth at 7 AM, then take 1 tablet by mouth at 11 AM, then take 2 tablets by mouth at 3 PM, then take 1 tablet by mouth at 7 PM per spouse. 07/12/20  Yes Tat, Eustace Quail, DO  divalproex (DEPAKOTE ER) 500 MG 24  hr tablet Take 1 tablet (500 mg total) by mouth daily. 12/05/20 01/04/21 Yes Camara, Maryan Puls, MD  esomeprazole (NEXIUM) 40 MG capsule Take 1 capsule (40 mg total) by mouth daily. 04/06/20  Yes Biagio Borg, MD  lovastatin (MEVACOR) 20 MG tablet TAKE 1 TABLET AT BEDTIME Patient taking differently: Take 20 mg by mouth daily. 08/30/20  Yes Biagio Borg, MD  Multiple Vitamin (MULTIVITAMIN WITH MINERALS) TABS tablet Take 1 tablet by mouth daily.   Yes [provider]  Polyethyl Glycol-Propyl Glycol (SYSTANE ULTRA) 0.4-0.3 % SOLN Place 1 drop into both eyes daily as needed (For dry eyes).   Yes [provider]  traMADol (ULTRAM) 50 MG tablet TAKE 1 TABLET BY MOUTH EVERY 6 HOURS AS NEEDED FOR PAIN Patient taking differently: Take 50 mg by mouth every 6 (six) hours as needed for moderate pain. 10/25/20  Yes Biagio Borg, MD  furosemide (LASIX) 40 MG tablet Take 1 tablet (40 mg total) by mouth daily. Patient not taking: Reported on 12/30/2020 12/13/20   Horald Pollen, MD  nitroGLYCERIN (NITROSTAT) 0.4 MG SL tablet PLACE 1 TABLET (0.4 MG TOTAL) UNDER THE TONGUE EVERY 5 (FIVE) MINUTES AS NEEDED. Patient taking  differently: 0.4 mg every 5 (five) minutes as needed for chest pain. 07/01/19   Minus Breeding, MD    Allergies    Atorvastatin, Simvastatin, and Statins  Review of Systems   Review of Systems  Respiratory:  Positive for cough and shortness of breath.   Genitourinary:  Negative for dysuria and hematuria.  All other systems reviewed and are negative.  Physical Exam Updated Vital Signs BP (!) 154/81    Pulse 79    Temp 98.5 F (36.9 C) (Oral)    Resp (!) 21    Ht 5\' 4"  (1.626 m)    Wt 60.8 kg    SpO2 97%    BMI 23.00 kg/m   Physical Exam Vitals and nursing note reviewed.  Constitutional:      General: He is not in acute distress.    Appearance: Normal appearance.  HENT:     Head: Normocephalic and atraumatic.  Eyes:     General:        Right eye: No discharge.        Left eye: No discharge.  Cardiovascular:     Comments: Regular rate and rhythm.  S1/S2 are distinct without any evidence of murmur, rubs, or gallops.  Radial pulses are 2+ bilaterally.  Dorsalis pedis pulses are 2+ bilaterally.  Pulmonary:     Comments: Normal efforts. Diffuse wheezing heard without auscultation. No respiratory distress at this time. Abdominal:     General: Abdomen is flat. Bowel sounds are normal. There is no distension.     Tenderness: There is no abdominal tenderness. There is no guarding or rebound.  Musculoskeletal:        General: Normal range of motion.     Cervical back: Neck supple.     Right lower leg: 1+ Pitting Edema present.     Left lower leg: 1+ Pitting Edema present.  Skin:    General: Skin is warm and dry.     Findings: No rash.  Neurological:     General: No focal deficit present.     Mental Status: He is alert.  Psychiatric:        Mood and Affect: Mood normal.        Behavior: Behavior normal.    ED Results / Procedures / Treatments  Labs (all labs ordered are listed, but only abnormal results are displayed) Labs Reviewed  BASIC METABOLIC PANEL - Abnormal;  Notable for the following components:      Result Value   Glucose, Bld 114 (*)    Creatinine, Ser 1.41 (*)    GFR, Estimated 52 (*)    All other components within normal limits  CBC - Abnormal; Notable for the following components:   RBC 4.03 (*)    Hemoglobin 12.1 (*)    HCT 37.9 (*)    All other components within normal limits  BRAIN NATRIURETIC PEPTIDE - Abnormal; Notable for the following components:   B Natriuretic Peptide 491.4 (*)    All other components within normal limits  RESP PANEL BY RT-PCR (FLU A&B, COVID) ARPGX2  MAGNESIUM    EKG EKG Interpretation  Date/Time:  Friday December 30 2020 10:49:50 EST Ventricular Rate:  58 PR Interval:    QRS Duration: 166 QT Interval:  458 QTC Calculation: 449 R Axis:   107 Text Interpretation: Atrial fibrillation with slow ventricular response with premature ventricular or aberrantly conducted complexes Right bundle branch block T wave abnormality, consider inferolateral ischemia Abnormal ECG Confirmed by Thamas Jaegers ([id number]) on 12/30/2020 3:29:54 PM  Radiology DG Chest 2 View  Result Date: 12/30/2020 CLINICAL DATA:  CHF EXAM: CHEST - 2 VIEW COMPARISON:  12/13/2020 FINDINGS: Similar cardiomegaly. Prior CABG. Shallow inspiration with low lung volumes. Few Kerley B-lines are present. No pleural effusion or pneumothorax. IMPRESSION: Mild interstitial edema.  Similar cardiomegaly. Electronically Signed   By: Macy Mis M.D.   On: 12/30/2020 10:41    Procedures Procedures   Medications Ordered in ED Medications  carbidopa-levodopa (SINEMET IR) 25-100 MG per tablet immediate release 1 tablet (1 tablet Oral Given 12/30/20 1720)  ipratropium-albuterol (DUONEB) 0.5-2.5 (3) MG/3ML nebulizer solution 3 mL (3 mLs Nebulization Given 12/30/20 1534)  potassium chloride (KLOR-CON M) CR tablet 30 mEq (30 mEq Oral Given 12/30/20 1558)  furosemide (LASIX) injection 60 mg (60 mg Intravenous Given 12/30/20 1619)  methylPREDNISolone sodium  succinate (SOLU-MEDROL) 125 mg/2 mL injection 125 mg (125 mg Intravenous Given 12/30/20 1620)  ipratropium-albuterol (DUONEB) 0.5-2.5 (3) MG/3ML nebulizer solution 3 mL (3 mLs Nebulization Given 12/30/20 1720)    ED Course  I have reviewed the triage vital signs and the nursing notes.  Pertinent labs & imaging results that were available during my care of the patient were reviewed by me and considered in my medical decision making (see chart for details).  Clinical Course as of 12/30/20 1821  Fri Dec 30, 2020  1640 On reevaluation, patient is still wheezing however it is improved from my initial evaluation.  Will give another DuoNeb. [CF]  1819 I discussed this case with my attending physician who cosigned this note including patient's presenting symptoms, physical exam, and planned diagnostics and interventions. Attending physician stated agreement with plan or made changes to plan which were implemented.    [CF]    Clinical Course User Index [CF] Cherrie Gauze   MDM Rules/Calculators/A&P                          Brandon Rojas. is a 75 y.o. male who presents to the emergency department with shortness of breath, wheezing, and cough.  I am suspicious that this is likely a CHF exacerbation versus undiagnosed chronic lung disease.  Patient does clinically appear somewhat volume overloaded.  However, he is wheezing on my exam.  I will start him on an IV dose of Lasix, Solu-Medrol, and nebulizer treatments.  He is currently in no respiratory distress with 100% oxygenation on room air and no obvious signs of tachypnea.  I will also replete his potassium given that we will start him on Lasix.  CBC shows ongoing chronic anemia.  BMP shows elevated creatinine which is better than his baseline.  Respiratory panel was negative.  BNP was around 500.  This fits with possible mild CHF exacerbation.  Magnesium was negative.  Chest x-ray showed interstitial edema.    On repeat evaluations,  patient had large amounts of urine output and his wheezing greatly improved after 2 rounds of nebulizer treatments.  Patient and family wishes to go home.  I instructed them to take 20 mg p.o. of Lasix daily starting tomorrow until they follow-up with her primary care doctor.  Strict return precautions given.  He is safe for discharge.     Final Clinical Impression(s) / ED Diagnoses Final diagnoses:  Shortness of breath  Wheezing    Rx / DC Orders ED Discharge Orders     None        Cherrie Gauze 12/30/20 Manon Hilding, MD 01/05/21 2322

## 2020-12-30 NOTE — ED Notes (Signed)
Discharge instructions reviewed with patient and wife. Patient and wife verbalized understanding of instructions. Follow-up care and medications were reviewed. Patient ambulatory with steady gait. VSS upon discharge.

## 2020-12-30 NOTE — ED Triage Notes (Signed)
Pt/wife stated, Has been having SOB with some wheezing with some coughing. He has CHF and Parkinson's.

## 2020-12-30 NOTE — Discharge Instructions (Signed)
Please follow-up with your primary care doctor after the holiday.  I would like for you to take 20 mg p.o. Lasix daily starting tomorrow.  Please use your inhaler as needed for wheezing and shortness of breath.  Please return to the emergency department if you experience worsening shortness of breath, chest pain, or any other concerns you might have.

## 2021-01-03 ENCOUNTER — Encounter: Payer: Self-pay | Admitting: Internal Medicine

## 2021-01-03 DIAGNOSIS — J449 Chronic obstructive pulmonary disease, unspecified: Secondary | ICD-10-CM

## 2021-01-03 MED ORDER — IPRATROPIUM-ALBUTEROL 0.5-2.5 (3) MG/3ML IN SOLN: 3.0000 mL | Freq: Four times a day (QID) | RESPIRATORY_TRACT | 3 refills | Status: AC | PRN

## 2021-01-03 NOTE — Telephone Encounter (Signed)
Med rx done erx  Ok for [employer]- rx done hardcopy to [place]

## 2021-01-04 NOTE — Telephone Encounter (Signed)
Patient notified and order has been faxed

## 2021-01-06 ENCOUNTER — Ambulatory Visit: Payer: Medicare HMO | Admitting: Internal Medicine

## 2021-01-10 ENCOUNTER — Encounter: Payer: Self-pay | Admitting: Internal Medicine

## 2021-01-10 NOTE — Telephone Encounter (Signed)
Patient's spouse requesting 30 day supply of apixaban (ELIQUIS) 5 MG TABS tablet  Caller states patient has 1 dose left  Informed caller refill request may take up to  3bds  [place], [place]

## 2021-01-11 ENCOUNTER — Other Ambulatory Visit: Payer: Self-pay

## 2021-01-11 MED ORDER — APIXABAN 5 MG PO TABS: 5.0000 mg | ORAL_TABLET | Freq: Two times a day (BID) | ORAL | 3 refills | Status: DC

## 2021-01-13 ENCOUNTER — Telehealth: Payer: Self-pay | Admitting: Internal Medicine

## 2021-01-13 ENCOUNTER — Encounter: Payer: Self-pay | Admitting: Internal Medicine

## 2021-01-13 MED ORDER — HYDROCOD POLST-CPM POLST ER 10-8 MG/5ML PO SUER: 5.0000 mL | Freq: Two times a day (BID) | ORAL | 0 refills | Status: DC | PRN

## 2021-01-13 NOTE — Telephone Encounter (Signed)
Brandon Rojas from [facility] calling in  [facility] of patient is requesting he be evaluated for palliative/hospice care  Would like verbal okay  Please call [phone]

## 2021-01-13 NOTE — Telephone Encounter (Signed)
Ok for verbal 

## 2021-01-16 ENCOUNTER — Encounter: Payer: Self-pay | Admitting: Internal Medicine

## 2021-01-16 ENCOUNTER — Telehealth: Payer: Self-pay | Admitting: Internal Medicine

## 2021-01-16 DIAGNOSIS — I10 Essential (primary) hypertension: Secondary | ICD-10-CM | POA: Diagnosis not present

## 2021-01-16 DIAGNOSIS — I4892 Unspecified atrial flutter: Secondary | ICD-10-CM | POA: Diagnosis not present

## 2021-01-16 DIAGNOSIS — U071 COVID-19: Secondary | ICD-10-CM | POA: Diagnosis not present

## 2021-01-16 DIAGNOSIS — I517 Cardiomegaly: Secondary | ICD-10-CM | POA: Diagnosis not present

## 2021-01-16 DIAGNOSIS — K219 Gastro-esophageal reflux disease without esophagitis: Secondary | ICD-10-CM | POA: Diagnosis not present

## 2021-01-16 DIAGNOSIS — R069 Unspecified abnormalities of breathing: Secondary | ICD-10-CM | POA: Diagnosis not present

## 2021-01-16 DIAGNOSIS — I110 Hypertensive heart disease with heart failure: Secondary | ICD-10-CM | POA: Diagnosis not present

## 2021-01-16 DIAGNOSIS — G20 Parkinson's disease: Secondary | ICD-10-CM | POA: Diagnosis not present

## 2021-01-16 DIAGNOSIS — Z7901 Long term (current) use of anticoagulants: Secondary | ICD-10-CM | POA: Diagnosis not present

## 2021-01-16 DIAGNOSIS — R0602 Shortness of breath: Secondary | ICD-10-CM | POA: Diagnosis not present

## 2021-01-16 DIAGNOSIS — E119 Type 2 diabetes mellitus without complications: Secondary | ICD-10-CM | POA: Diagnosis not present

## 2021-01-16 DIAGNOSIS — Z8679 Personal history of other diseases of the circulatory system: Secondary | ICD-10-CM | POA: Diagnosis not present

## 2021-01-16 DIAGNOSIS — J189 Pneumonia, unspecified organism: Secondary | ICD-10-CM | POA: Insufficient documentation

## 2021-01-16 DIAGNOSIS — J1282 Pneumonia due to coronavirus disease 2019: Secondary | ICD-10-CM | POA: Diagnosis not present

## 2021-01-16 DIAGNOSIS — Z8673 Personal history of transient ischemic attack (TIA), and cerebral infarction without residual deficits: Secondary | ICD-10-CM | POA: Diagnosis not present

## 2021-01-16 DIAGNOSIS — Z8669 Personal history of other diseases of the nervous system and sense organs: Secondary | ICD-10-CM | POA: Diagnosis not present

## 2021-01-16 DIAGNOSIS — R0689 Other abnormalities of breathing: Secondary | ICD-10-CM | POA: Diagnosis not present

## 2021-01-16 DIAGNOSIS — I5032 Chronic diastolic (congestive) heart failure: Secondary | ICD-10-CM | POA: Diagnosis not present

## 2021-01-16 DIAGNOSIS — Z515 Encounter for palliative care: Secondary | ICD-10-CM | POA: Diagnosis not present

## 2021-01-16 DIAGNOSIS — I482 Chronic atrial fibrillation, unspecified: Secondary | ICD-10-CM | POA: Diagnosis not present

## 2021-01-16 DIAGNOSIS — J811 Chronic pulmonary edema: Secondary | ICD-10-CM | POA: Diagnosis not present

## 2021-01-16 MED ORDER — HYDROCOD POLST-CPM POLST ER 10-8 MG/5ML PO SUER: 5.0000 mL | Freq: Two times a day (BID) | ORAL | 0 refills | Status: AC | PRN

## 2021-01-16 NOTE — Telephone Encounter (Signed)
Verbals given to [employer]

## 2021-01-16 NOTE — Telephone Encounter (Signed)
Patient's spouse states [facility] does not have chlorpheniramine-HYDROcodone (TUSSIONEX PENNKINETIC ER) 10-8 MG/5ML SUER in stock  Spouse requesting rx sent to [id number] - [place], [place] - [address]

## 2021-01-16 NOTE — Telephone Encounter (Signed)
Done erx 

## 2021-01-17 ENCOUNTER — Ambulatory Visit: Payer: Medicare HMO | Admitting: Internal Medicine

## 2021-01-17 DIAGNOSIS — J181 Lobar pneumonia, unspecified organism: Secondary | ICD-10-CM | POA: Diagnosis not present

## 2021-01-17 DIAGNOSIS — G20 Parkinson's disease: Secondary | ICD-10-CM | POA: Diagnosis not present

## 2021-01-17 DIAGNOSIS — I4510 Unspecified right bundle-branch block: Secondary | ICD-10-CM | POA: Diagnosis not present

## 2021-01-17 DIAGNOSIS — Z515 Encounter for palliative care: Secondary | ICD-10-CM | POA: Diagnosis not present

## 2021-01-17 DIAGNOSIS — I5032 Chronic diastolic (congestive) heart failure: Secondary | ICD-10-CM | POA: Diagnosis not present

## 2021-01-17 DIAGNOSIS — E119 Type 2 diabetes mellitus without complications: Secondary | ICD-10-CM | POA: Diagnosis not present

## 2021-01-17 DIAGNOSIS — Z8673 Personal history of transient ischemic attack (TIA), and cerebral infarction without residual deficits: Secondary | ICD-10-CM | POA: Diagnosis not present

## 2021-01-17 DIAGNOSIS — Z7409 Other reduced mobility: Secondary | ICD-10-CM | POA: Diagnosis not present

## 2021-01-17 DIAGNOSIS — I4891 Unspecified atrial fibrillation: Secondary | ICD-10-CM | POA: Diagnosis not present

## 2021-01-17 DIAGNOSIS — I110 Hypertensive heart disease with heart failure: Secondary | ICD-10-CM | POA: Diagnosis not present

## 2021-01-17 DIAGNOSIS — I482 Chronic atrial fibrillation, unspecified: Secondary | ICD-10-CM | POA: Diagnosis not present

## 2021-01-17 DIAGNOSIS — I4892 Unspecified atrial flutter: Secondary | ICD-10-CM | POA: Diagnosis not present

## 2021-01-17 DIAGNOSIS — J1282 Pneumonia due to coronavirus disease 2019: Secondary | ICD-10-CM | POA: Diagnosis not present

## 2021-01-17 DIAGNOSIS — Z789 Other specified health status: Secondary | ICD-10-CM | POA: Diagnosis not present

## 2021-01-17 DIAGNOSIS — U071 COVID-19: Secondary | ICD-10-CM | POA: Diagnosis not present

## 2021-01-17 DIAGNOSIS — Z7901 Long term (current) use of anticoagulants: Secondary | ICD-10-CM | POA: Diagnosis not present

## 2021-01-18 DIAGNOSIS — I4510 Unspecified right bundle-branch block: Secondary | ICD-10-CM | POA: Diagnosis not present

## 2021-01-18 DIAGNOSIS — G20 Parkinson's disease: Secondary | ICD-10-CM | POA: Diagnosis not present

## 2021-01-18 DIAGNOSIS — I4891 Unspecified atrial fibrillation: Secondary | ICD-10-CM | POA: Diagnosis not present

## 2021-01-18 DIAGNOSIS — Z515 Encounter for palliative care: Secondary | ICD-10-CM | POA: Diagnosis not present

## 2021-01-18 DIAGNOSIS — Z8673 Personal history of transient ischemic attack (TIA), and cerebral infarction without residual deficits: Secondary | ICD-10-CM | POA: Diagnosis not present

## 2021-01-18 DIAGNOSIS — I482 Chronic atrial fibrillation, unspecified: Secondary | ICD-10-CM | POA: Diagnosis not present

## 2021-01-18 DIAGNOSIS — I5032 Chronic diastolic (congestive) heart failure: Secondary | ICD-10-CM | POA: Diagnosis not present

## 2021-01-18 DIAGNOSIS — I4892 Unspecified atrial flutter: Secondary | ICD-10-CM | POA: Diagnosis not present

## 2021-01-18 DIAGNOSIS — J1282 Pneumonia due to coronavirus disease 2019: Secondary | ICD-10-CM | POA: Diagnosis not present

## 2021-01-18 DIAGNOSIS — I110 Hypertensive heart disease with heart failure: Secondary | ICD-10-CM | POA: Diagnosis not present

## 2021-01-18 DIAGNOSIS — E119 Type 2 diabetes mellitus without complications: Secondary | ICD-10-CM | POA: Diagnosis not present

## 2021-01-18 DIAGNOSIS — U071 COVID-19: Secondary | ICD-10-CM | POA: Diagnosis not present

## 2021-01-18 DIAGNOSIS — J181 Lobar pneumonia, unspecified organism: Secondary | ICD-10-CM | POA: Diagnosis not present

## 2021-01-20 ENCOUNTER — Telehealth: Payer: Self-pay

## 2021-01-20 NOTE — Telephone Encounter (Signed)
Elane Fritz states that it will be 2/3  weeks before patient can make it in for a HOS F/U due to being so sick from [place]. Suggested maybe doing a VV.  Elane Fritz also need Verbal orders for: PT for one time a week x 8 weeks.  CB [phone]

## 2021-01-20 NOTE — Telephone Encounter (Signed)
Ok for verbals and VV next wk

## 2021-01-20 NOTE — Telephone Encounter (Signed)
Verbals left on voicemail. 

## 2021-01-20 NOTE — Telephone Encounter (Signed)
Scheduled for 1/20 8:20 and Wife lynda confirmed

## 2021-01-22 ENCOUNTER — Encounter: Payer: Self-pay | Admitting: Neurology

## 2021-01-23 NOTE — Telephone Encounter (Signed)
Ok for verbals 

## 2021-01-23 NOTE — Telephone Encounter (Signed)
Home Health verbal orders-caller/Agency: Raj/ [facility]  Callback number: [phone] (secure vm)  Requesting OT/PT/Skilled nursing/Social Work/Speech: OT  Frequency: 1w8  Start Date: 01-24-2021

## 2021-01-24 NOTE — Telephone Encounter (Signed)
Verbals left on secure voicemail

## 2021-01-27 ENCOUNTER — Other Ambulatory Visit: Payer: Self-pay

## 2021-01-27 ENCOUNTER — Telehealth (INDEPENDENT_AMBULATORY_CARE_PROVIDER_SITE_OTHER): Payer: Medicare HMO | Admitting: Internal Medicine

## 2021-01-27 DIAGNOSIS — U071 COVID-19: Secondary | ICD-10-CM | POA: Diagnosis not present

## 2021-01-27 DIAGNOSIS — I10 Essential (primary) hypertension: Secondary | ICD-10-CM | POA: Diagnosis not present

## 2021-01-27 DIAGNOSIS — E118 Type 2 diabetes mellitus with unspecified complications: Secondary | ICD-10-CM | POA: Diagnosis not present

## 2021-01-27 DIAGNOSIS — J189 Pneumonia, unspecified organism: Secondary | ICD-10-CM | POA: Diagnosis not present

## 2021-01-27 NOTE — Progress Notes (Signed)
Patient ID: Brandon Wanless., male   DOB: 10/07/1945, 76 y.o.   MRN: [id number]  Virtual Visit via Video Note  I connected with Brandon REINDL Sr. on 01/27/21 at  8:20 AM EST by a video enabled telemedicine application and verified that I am speaking with the correct person using two identifiers.  Location of all participants today Patient: at home with wife present Provider: at office   I discussed the limitations of evaluation and management by telemedicine and the availability of in person appointments. The patient expressed understanding and agreed to proceed.  History of Present Illness: Here to f/u after recent hospn 1/9  - 1/11 at [facility] with LLL CAP and covid infection not requiring oxygen -   Per d/c summary:   Patient is a 76 year old Caucasian male with history of HFpEF, A. fib rate controlled on Eliquis GERD hypertension parkinsonism history of CVA and TIA and dementia was admitted secondary complaints of 4-day history of generalized weakness and shortness of breath associate with some nonproductive cough. Patient denied any other significant complaints. Patient was evaluated and found to have COVID positive and required assistance with ambulation. X-ray showed patient to have findings of pneumonia. Patient was empirically treated with oral antibiotic therapy, patient was not requiring oxygen so was not put on dexamethasone or remdesivir. Patient was evaluated by physical therapy and Occupational Therapy recommended SNF placement, but patient's wife did not wanted patient to be discharged to SNF requested home health care understanding the risks given patient's overall debility. Patient did not had any further complication was discharged home on oral antibiotic therapy to complete treatment for community-acquired pneumonia. Patient on room air at discharge  Today per pt and wife;  Pt has been started wit [facility] PT and OT at home, but too weak to participate meaningfully yet.  Plan is  to f/u once weekly for 8 wks for now.  Also started Palliative care per [facility] - nurse to come every 1-2 wks starting Monday jan 23. Has not fallen so far at home, wife very supportive using wheelchair for getting around the house, then graduated to the rollater a few days ago.  Has hacking persistent cough, scant productive yellow, no fever, has also nebulizer and inhaler prn and only using about twice per day.  Pt denies chest pain, increased sob or doe, wheezing, orthopnea, PND, increased LE swelling, palpitations, dizziness or syncope. No increased WOB, in fact better subjectively per wife,  Has been eating some bites at meal, and o/w supplemented with boost, and in last 2 days had eaten nearly whole bfast and supper and light lunch.  Denies worsening reflux, abd pain, dysphagia, n/v, bowel change or blood.  Denies urinary symptoms such as dysuria, frequency, urgency, flank pain, hematuria or n/v, fever, chills.  Next appt here is feb 9 coincidently. Has appt with Dr Tat next tues jan 24 virtual.   Transitional Care Management elements noted today: 1)  Date of D/C: as above 2)  Medication reconciliation:  done today at end visit with pt/wife 3)  Review of D/C summary or other information:  done today 4)  Review of need for f/u on pending diagnostic tests and treatments:  done today - none 5)  Review of need for Interaction with other providers who will assume or resume care of pt specific problems: done today - none, pt to continue usual planned neuro f/u 6)  Education of patient/family/guardian or caregiver: none needed today, wife remains supportive  Past  Medical History:  Diagnosis Date   Anxiety state, unspecified    Arthritis    Benign neoplasm of colon    CAD (coronary artery disease) of artery bypass graft 05/20/2018   Depressive disorder, not elsewhere classified    Diaphragmatic hernia without mention of obstruction or gangrene    Diverticulosis of colon (without mention of  hemorrhage)    Esophageal reflux    Esophageal stricture    Hiatal hernia    Mitral regurgitation    Osteoarthrosis, unspecified whether generalized or localized, unspecified site    Other and unspecified hyperlipidemia    Parkinson's disease ([place])    Personal history of colonic polyps    Stroke [facility])    Unspecified essential hypertension    Past Surgical History:  Procedure Laterality Date   CARDIOVERSION N/A 03/01/2020   Procedure: CARDIOVERSION;  Surgeon: Pixie Casino, MD;  Location: [place];  Service: Cardiovascular;  Laterality: N/A;   COLONOSCOPY     CORONARY ARTERY BYPASS GRAFT     x 2   INGUINAL HERNIA REPAIR     bilateral   IR EXCHANGE BILIARY DRAIN  11/06/2018   IR PERC CHOLECYSTOSTOMY  05/22/2018   IR RADIOLOGIST EVAL & MGMT  06/24/2018   IR RADIOLOGIST EVAL & MGMT  07/01/2018   LUNG BIOPSY     POLYPECTOMY     PTCA     TEE WITHOUT CARDIOVERSION N/A 03/01/2020   Procedure: TRANSESOPHAGEAL ECHOCARDIOGRAM (TEE);  Surgeon: Pixie Casino, MD;  Location: [facility] ENDOSCOPY;  Service: Cardiovascular;  Laterality: N/A;    reports that he quit smoking about 48 years ago. His smoking use included cigarettes. He has never used smokeless tobacco. He reports that he does not drink alcohol and does not use drugs. family history includes Colon cancer in his sister; Fibromyalgia in his daughter; Healthy in his brother, brother, brother, daughter, and sister; Heart attack in his mother; Heart disease in his brother; Multiple sclerosis in his son; Stroke in his father. Allergies  Allergen Reactions   Atorvastatin Other (See Comments)    Muscle cramps   Simvastatin Other (See Comments)    Muscle cramps   Statins Hives    pts wife states its two statin medications he is allergic to   Current Outpatient Medications on File Prior to Visit  Medication Sig Dispense Refill   albuterol (VENTOLIN HFA) 108 (90 Base) MCG/ACT inhaler Inhale 1-2 puffs into the lungs every 6 (six) hours as  needed for wheezing or shortness of breath. 18 g 4   apixaban (ELIQUIS) 5 MG TABS tablet TAKE 1 TABLET (5 MG TOTAL) BY MOUTH TWO TIMES DAILY. 180 tablet 3   aspirin 81 MG chewable tablet Chew 1 tablet by mouth daily.     B Complex Vitamins (VITAMIN B COMPLEX PO) Take 1 tablet by mouth as needed.     carbidopa-levodopa (SINEMET IR) 25-100 MG tablet 2 at 7 AM/1 at 11 AM/2 at 3 PM/1 tblet at 7 PM (Patient taking differently: Take 1 tablet by mouth See admin instructions. Take 2 tablets by mouth at 7 AM, then take 1 tablet by mouth at 11 AM, then take 2 tablets by mouth at 3 PM, then take 1 tablet by mouth at 7 PM per spouse.) 540 tablet 2   chlorpheniramine-HYDROcodone (TUSSIONEX PENNKINETIC ER) 10-8 MG/5ML SUER Take 5 mLs by mouth every 12 (twelve) hours as needed for cough. 115 mL 0   divalproex (DEPAKOTE ER) 500 MG 24 hr tablet Take 1 tablet (500 mg  total) by mouth daily. 30 tablet 3   esomeprazole (NEXIUM) 40 MG capsule Take 1 capsule (40 mg total) by mouth daily. 90 capsule 3   furosemide (LASIX) 40 MG tablet Take 1 tablet (40 mg total) by mouth daily. (Patient not taking: Reported on 12/30/2020) 30 tablet 3   ipratropium-albuterol (DUONEB) 0.5-2.5 (3) MG/3ML SOLN Take 3 mLs by nebulization every 6 (six) hours as needed. 360 mL 3   lovastatin (MEVACOR) 20 MG tablet TAKE 1 TABLET AT BEDTIME (Patient taking differently: Take 20 mg by mouth daily.) 90 tablet 1   Multiple Vitamin (MULTIVITAMIN WITH MINERALS) TABS tablet Take 1 tablet by mouth daily.     nitroGLYCERIN (NITROSTAT) 0.4 MG SL tablet PLACE 1 TABLET (0.4 MG TOTAL) UNDER THE TONGUE EVERY 5 (FIVE) MINUTES AS NEEDED. (Patient taking differently: 0.4 mg every 5 (five) minutes as needed for chest pain.) 50 tablet 3   Polyethyl Glycol-Propyl Glycol (SYSTANE ULTRA) 0.4-0.3 % SOLN Place 1 drop into both eyes daily as needed (For dry eyes).     traMADol (ULTRAM) 50 MG tablet TAKE 1 TABLET BY MOUTH EVERY 6 HOURS AS NEEDED FOR PAIN (Patient taking  differently: Take 50 mg by mouth every 6 (six) hours as needed for moderate pain.) 120 tablet 5   No current facility-administered medications on file prior to visit.   Observations/Objective: Alert, NAD, appropriate mood and affect, resps normal, cn 2-12 intact, moves all 4s, no visible rash or swelling Lab Results  Component Value Date   WBC 9.0 12/30/2020   HGB 12.1 (L) 12/30/2020   HCT 37.9 (L) 12/30/2020   PLT 177 12/30/2020   GLUCOSE 114 (H) 12/30/2020   CHOL 113 04/11/2020   TRIG 118.0 04/11/2020   HDL 51.10 04/11/2020   LDLCALC 38 04/11/2020   ALT 4 04/11/2020   AST 19 04/11/2020   NA 142 12/30/2020   K 3.9 12/30/2020   CL 109 12/30/2020   CREATININE 1.41 (H) 12/30/2020   BUN 18 12/30/2020   CO2 25 12/30/2020   TSH 4.470 02/26/2020   PSA 1.23 09/04/2018   INR 2.2 (H) 03/01/2020   HGBA1C 5.6 04/11/2020   Assessment and Plan: See notes  Follow Up Instructions: See notes   I discussed the assessment and treatment plan with the patient. The patient was provided an opportunity to ask questions and all were answered. The patient agreed with the plan and demonstrated an understanding of the instructions.   The patient was advised to call back or seek an in-person evaluation if the symptoms worsen or if the condition fails to improve as anticipated.   [name], MD

## 2021-01-28 ENCOUNTER — Encounter: Payer: Self-pay | Admitting: Internal Medicine

## 2021-01-28 DIAGNOSIS — U071 COVID-19: Secondary | ICD-10-CM | POA: Insufficient documentation

## 2021-01-28 NOTE — Patient Instructions (Addendum)
Please continue all other medications as before, and refills have been done if requested.  Please have the pharmacy call with any other refills you may need.  Please continue your efforts at being more active, low cholesterol diet, and weight control.  Please keep your appointments with your specialists as you may have planned     

## 2021-01-28 NOTE — Assessment & Plan Note (Signed)
Pt to continue to monitor BP at home and next visit  BP Readings from Last 3 Encounters:  12/30/20 (!) 154/81  12/22/20 100/60  12/13/20 110/60

## 2021-01-28 NOTE — Assessment & Plan Note (Signed)
Resolved now stable within limits of this exam; finished d/c home antibx, no further tx or evaluation needed

## 2021-01-28 NOTE — Assessment & Plan Note (Signed)
Lab Results  Component Value Date   HGBA1C 5.6 04/11/2020   Stable, pt to continue current medical treatment  - diet

## 2021-01-28 NOTE — Assessment & Plan Note (Signed)
Resolved now stable within limits of this exam;  no further tx or evaluation needed

## 2021-01-30 NOTE — Progress Notes (Addendum)
Virtual Visit Via Video   The purpose of this virtual visit is to provide medical care while limiting exposure to the novel coronavirus.    Consent was obtained for video visit:  Yes.   Answered questions that patient had about telehealth interaction:  Yes.   I discussed the limitations, risks, security and privacy concerns of performing an evaluation and management service by telemedicine. I also discussed with the patient that there may be a patient responsible charge related to this service. The patient expressed understanding and agreed to proceed.  Pt location: Home Physician Location: office Name of referring provider:  Biagio Borg, MD I connected with Brandon Kraft Sr. at patients initiation/request on 01/31/2021 at 11:15 AM EST by video enabled telemedicine application and verified that I am speaking with the correct person using two identifiers. Pt MRN:  [id number] Pt DOB:  01/28/1945 Video Participants:  Brandon Kraft Sr.;  wife supplements most of hx   Assessment/Plan:   1.  [name]  -pt looks much worse than last visit.  Wife reports worse since pt had covid earlier this month.  Unfortunately he had declined in patient rehav  -increase carbidopa/levodopa 25/100, 2 tablets at 7 AM/2 tablet at 11 AM/2 tablets at 3 PM/2 tablet at 7 PM 2.  History of TIA in August, 2020 and April, 2021  -Has followed with Dr. Leonie Man  -On lovastatin  -Cardiology removed the aspirin when they stated him on apixaban.  -use walker! 3.  Atrial fibrillation (PAF)  -On apixaban.  Followed by Dr. Percival Spanish.  -pt asked about this and explained to best of my ability today 4.  Episodes of mental status change  -I am not convinced that these were seizure.  With the only event that he actually went to the hospital, his blood pressure was 215/112 and once that got under control, his mental status returned to normal.  He had another event outside of the hospital, called paramedics, but was never  seen or evaluated by physician.  He was ultimately seen by [facility] neurology and placed on Keppra which he did not tolerate and it was changed to Depakote.  EEG was unremarkable with the exception of slowing.  He is certainly at risk for seizure, so I did not change his medication today.  We will need to address need for Depakote in the future. 5.  Patient currently too weak to get into the office.  Wife would like him to try to be seen within the next few months and I think that is reasonable.  I will try to make him an appointment by the end of March.  Subjective:   Brandon Kraft Sr. was seen today in follow up for [name].  My previous records were reviewed prior to todays visit as well as outside records available to me.  Much has happened since our last visit and numerous records are reviewed.  Last saw him in July, 2022.  Wife has kept me updated, as well as reviewing medical records. Current prescribed movement disorder medications.  Patient admitted to [facility] at end of July/beginning of August for increased blood pressure and possible mental status change after a fall.  There is a question of whether he had left-sided weakness in route to the hospital, but was resolved by the time he got there.  Patient was hypertensive on admission with a blood pressure of 215/112.  CT brain and MRI brain were nonacute.  Patient was given IV  labetalol which really did not help and subsequently given IV hydralazine which did help.  Patient was discharged on 20 mg lisinopril.  Patient's wife emailed me separately in November stating that she thought that the patient had a small stroke.  Sounds like perhaps he had mental status change again, but was seen by paramedics.  It was unclear exactly what the symptoms were, but his primary care physician requested a consult with Dr. Leonie Man.  He was seen on November 15.  I reviewed those records.  He felt that the symptoms could represent seizure and  started the patient on Keppra XR, 500 mg daily.  Patient's wife emailed me back 2 days later and stated "this 500 mg of Keppra made my husband feel very bad, said he was not going to take it anymore since the test showed no sign of seizures, too strong of milligram."  I did tell them that they would need to follow-up with [facility] neurology since they were the prescribing physicians for that.  They switched him to Depakote.  Patient was back in the hospital in [facility] in January because of COVID and COVID-pneumonia.  Discharged to subacute rehab was recommended, but they did not want to do that and patient ended up going home.  PT is coming out to the home just 1 time per week because he is "so weak."  Wife states that he slowed down with covid.   carbidopa/levodopa 25/100, 2 tablets at 7 AM/1 tablet at 11 AM/2 tablets at 3 PM/1 tablet at 7 PM    ALLERGIES:   Allergies  Allergen Reactions   Atorvastatin Other (See Comments)    Muscle cramps   Simvastatin Other (See Comments)    Muscle cramps   Statins Hives    pts wife states its two statin medications he is allergic to    CURRENT MEDICATIONS:  Outpatient Encounter Medications as of 01/31/2021  Medication Sig   albuterol (VENTOLIN HFA) 108 (90 Base) MCG/ACT inhaler Inhale 1-2 puffs into the lungs every 6 (six) hours as needed for wheezing or shortness of breath.   apixaban (ELIQUIS) 5 MG TABS tablet TAKE 1 TABLET (5 MG TOTAL) BY MOUTH TWO TIMES DAILY.   aspirin 81 MG chewable tablet Chew 1 tablet by mouth daily.   B Complex Vitamins (VITAMIN B COMPLEX PO) Take 1 tablet by mouth as needed.   carbidopa-levodopa (SINEMET IR) 25-100 MG tablet 2 at 7 AM/1 at 11 AM/2 at 3 PM/1 tblet at 7 PM (Patient taking differently: Take 1 tablet by mouth See admin instructions. Take 2 tablets by mouth at 7 AM, then take 1 tablet by mouth at 11 AM, then take 2 tablets by mouth at 3 PM, then take 1 tablet by mouth at 7 PM per spouse.)    chlorpheniramine-HYDROcodone (TUSSIONEX PENNKINETIC ER) 10-8 MG/5ML SUER Take 5 mLs by mouth every 12 (twelve) hours as needed for cough.   esomeprazole (NEXIUM) 40 MG capsule Take 1 capsule (40 mg total) by mouth daily.   ipratropium-albuterol (DUONEB) 0.5-2.5 (3) MG/3ML SOLN Take 3 mLs by nebulization every 6 (six) hours as needed.   lovastatin (MEVACOR) 20 MG tablet TAKE 1 TABLET AT BEDTIME (Patient taking differently: Take 20 mg by mouth daily.)   Multiple Vitamin (MULTIVITAMIN WITH MINERALS) TABS tablet Take 1 tablet by mouth daily.   nitroGLYCERIN (NITROSTAT) 0.4 MG SL tablet PLACE 1 TABLET (0.4 MG TOTAL) UNDER THE TONGUE EVERY 5 (FIVE) MINUTES AS NEEDED. (Patient taking differently: 0.4 mg every 5 (  five) minutes as needed for chest pain.)   Polyethyl Glycol-Propyl Glycol (SYSTANE ULTRA) 0.4-0.3 % SOLN Place 1 drop into both eyes daily as needed (For dry eyes).   traMADol (ULTRAM) 50 MG tablet TAKE 1 TABLET BY MOUTH EVERY 6 HOURS AS NEEDED FOR PAIN (Patient taking differently: Take 50 mg by mouth every 6 (six) hours as needed for moderate pain.)   divalproex (DEPAKOTE ER) 500 MG 24 hr tablet Take 1 tablet (500 mg total) by mouth daily.   [DISCONTINUED] furosemide (LASIX) 40 MG tablet Take 1 tablet (40 mg total) by mouth daily. (Patient not taking: Reported on 12/30/2020)   No facility-administered encounter medications on file as of 01/31/2021.    Objective:   PHYSICAL EXAMINATION:    VITALS:   Vitals:   01/31/21 1105  Weight: 126 lb (57.2 kg)  Height: 5\' 8"  (1.727 m)     GEN:  The patient appears stated age and is in NAD. HEENT:  Normocephalic, atraumatic.  The mucous membranes are moist.   Neurological examination:  Orientation: The patient is alert and oriented to person.  He talks very little and relies on wife to supplement most of the history.  He follows simple commands. Cranial nerves: There is good facial symmetry with facial hypomimia. The speech is fluent and  significantly hypophonic. Soft palate rises symmetrically and there is no tongue deviation. Hearing is intact to conversational tone. Motor: Strength is at least antigravity x4.  Movement examination: Tone: unable Abnormal movements: none Coordination:  There is significant slowness to all rapid alternating movements.  It was so slow is difficult to tell whether or not there was a decremation.  This was treated bilaterally. Gait and Station: The patient pushes off to arise and rises with wife's help.  He is given a walker.  He has a stooped posture and drags the left leg but walks fairly well.    I have reviewed and interpreted the following labs independently    Chemistry      Component Value Date/Time   NA 142 12/30/2020 1036   K 3.9 12/30/2020 1036   CL 109 12/30/2020 1036   CO2 25 12/30/2020 1036   BUN 18 12/30/2020 1036   CREATININE 1.41 (H) 12/30/2020 1036      Component Value Date/Time   CALCIUM 9.2 12/30/2020 1036   ALKPHOS 110 04/11/2020 1204   AST 19 04/11/2020 1204   ALT 4 04/11/2020 1204   BILITOT 0.5 04/11/2020 1204       Lab Results  Component Value Date   WBC 9.0 12/30/2020   HGB 12.1 (L) 12/30/2020   HCT 37.9 (L) 12/30/2020   MCV 94.0 12/30/2020   PLT 177 12/30/2020    Lab Results  Component Value Date   TSH 4.470 02/26/2020     Total time spent on today's visit was 43 minutes, including both face-to-face time and nonface-to-face time.  Time included that spent on review of records (prior notes available to me/labs/imaging if pertinent), discussing treatment and goals, answering patient's questions and coordinating care.  Cc:  Biagio Borg, MD

## 2021-01-31 ENCOUNTER — Other Ambulatory Visit: Payer: Self-pay

## 2021-01-31 ENCOUNTER — Encounter: Payer: Self-pay | Admitting: Neurology

## 2021-01-31 ENCOUNTER — Telehealth (INDEPENDENT_AMBULATORY_CARE_PROVIDER_SITE_OTHER): Payer: Medicare HMO | Admitting: Neurology

## 2021-01-31 ENCOUNTER — Telehealth: Payer: Self-pay

## 2021-01-31 VITALS — Ht 68.0 in | Wt 126.0 lb

## 2021-01-31 DIAGNOSIS — R531 Weakness: Secondary | ICD-10-CM

## 2021-01-31 DIAGNOSIS — U099 Post covid-19 condition, unspecified: Secondary | ICD-10-CM

## 2021-01-31 DIAGNOSIS — G20 Parkinson's disease: Secondary | ICD-10-CM

## 2021-01-31 MED ORDER — CARBIDOPA-LEVODOPA 25-100 MG PO TABS
2.0000 | ORAL_TABLET | Freq: Four times a day (QID) | ORAL | 1 refills | Status: DC
Start: 2021-01-31 — End: 2021-03-27

## 2021-01-31 NOTE — Telephone Encounter (Signed)
Brandon Rojas with Palliative care is calling in on behave of family to see if Dr. Jenny Reichmann can give the verbal orders for Pt to start B12 1000 mcg to see if it will help with weakness.   You can call pt family Brandon Rojas [phone] to give the ok.

## 2021-01-31 NOTE — Telephone Encounter (Signed)
Patients wife notified

## 2021-01-31 NOTE — Telephone Encounter (Signed)
Sorry, no need as recent b12 was normal

## 2021-02-01 ENCOUNTER — Telehealth: Payer: Self-pay

## 2021-02-01 ENCOUNTER — Encounter: Payer: Self-pay | Admitting: Internal Medicine

## 2021-02-01 ENCOUNTER — Encounter: Payer: Self-pay | Admitting: Neurology

## 2021-02-01 NOTE — Telephone Encounter (Signed)
Patients wife called in regarding the Depakote that the patient was prescribed by Dr Leonie Man. Per the wife, patient is having episodes of confusion, not remembering where he lives, who his wife is, etc. Patients wife is wanting a call back before the end of the day today, she is not wanting to give the patient the his evening does of the medication. I did advised that I would not change his medication routine until speaking with the nurse. If you could please give her a call back before the end of business day today.  Thanks!

## 2021-02-01 NOTE — Telephone Encounter (Signed)
I spoke to the patient's wife. The patient has been taking divalproex since November 2022. He is experiencing a new onset of confusion over the last 2-3 days. His wife is reporting recent pneumonia/COVID infection (hospital on 01/17/20). She is going to call his PCP for a follow up. In the meantime, she will keep him well hydrated, keep a watch on his vitals. They have a spirometer at home and I encouraged him to use it, especially in light of his pneumonia diagnosis. She also verbalized understanding to proceed to the ED, if she feels he is declining and needs more urgent care. He will continue his divalproex for now.

## 2021-02-01 NOTE — Telephone Encounter (Signed)
I called the patient's wife. See note in Epic for further detail.

## 2021-02-02 ENCOUNTER — Telehealth: Payer: Self-pay | Admitting: Internal Medicine

## 2021-02-02 ENCOUNTER — Telehealth: Payer: Self-pay | Admitting: Neurology

## 2021-02-02 NOTE — Telephone Encounter (Signed)
Patient's wife called requesting to speak with a CMA about his carbidopa-levodopa 25-100 MG prescription since the increase this month.  He is having trouble waking completely up until about 10AM, he seems confused as well.

## 2021-02-02 NOTE — Telephone Encounter (Signed)
Mileen from [facility] calling in  Wife declined discharge visit today.. but patient has been officiallyt discharged from speech therapy eff today 01.26.23 at the family request

## 2021-02-02 NOTE — Telephone Encounter (Signed)
Nurse with [facility] states Pt wife doesn't not want to go to the ER.   I did set pt up with a OV tomorrow 1/27 with Jeralyn Ruths, NP.   Pt wife or Nurse will call back if wife says declines the appt.  FYI

## 2021-02-02 NOTE — Telephone Encounter (Signed)
Unfortunately there are Many Many things that can cause confusion being more out of it besides pneumonia  I cannot really say anything based on this limited information, so I would have to suggest the ED is really the only option to try to find a cause, so I hope they would go there now

## 2021-02-02 NOTE — Telephone Encounter (Signed)
Called patients wife back and she said since the change from 6-8 levodopa on Tuesday patient gets up and then wants to go back to sleep until 10 patients wife worried because she said this is very uncommon for this patient he usually gets up and they have breakfast patient not wanting to do those things and only wants to go back to bed

## 2021-02-02 NOTE — Telephone Encounter (Signed)
Nurse w/ [facility] states pt's spouse informed her pt has been experiencing increased confusion and is non responsive at times  Caller states after sleeping, pt seems to be more responsive, wife suspects pt may have pneumonia  Caller states pt's spouse does not want to take pt to ed  Nurse requesting a call back [phone]- Vicente Males  Please advise  *see below*

## 2021-02-03 ENCOUNTER — Encounter: Payer: Self-pay | Admitting: Nurse Practitioner

## 2021-02-03 ENCOUNTER — Other Ambulatory Visit: Payer: Medicare HMO

## 2021-02-03 ENCOUNTER — Telehealth: Payer: Self-pay | Admitting: Nurse Practitioner

## 2021-02-03 ENCOUNTER — Other Ambulatory Visit: Payer: Self-pay | Admitting: Nurse Practitioner

## 2021-02-03 ENCOUNTER — Other Ambulatory Visit: Payer: Self-pay

## 2021-02-03 ENCOUNTER — Ambulatory Visit (INDEPENDENT_AMBULATORY_CARE_PROVIDER_SITE_OTHER): Payer: Medicare HMO | Admitting: Nurse Practitioner

## 2021-02-03 ENCOUNTER — Ambulatory Visit (INDEPENDENT_AMBULATORY_CARE_PROVIDER_SITE_OTHER): Payer: Medicare HMO

## 2021-02-03 VITALS — BP 145/70 | HR 59 | Temp 98.3°F | Ht 68.0 in | Wt 127.0 lb

## 2021-02-03 DIAGNOSIS — Z8616 Personal history of COVID-19: Secondary | ICD-10-CM | POA: Diagnosis not present

## 2021-02-03 DIAGNOSIS — I509 Heart failure, unspecified: Secondary | ICD-10-CM

## 2021-02-03 DIAGNOSIS — R410 Disorientation, unspecified: Secondary | ICD-10-CM

## 2021-02-03 DIAGNOSIS — R052 Subacute cough: Secondary | ICD-10-CM

## 2021-02-03 DIAGNOSIS — R531 Weakness: Secondary | ICD-10-CM

## 2021-02-03 DIAGNOSIS — I517 Cardiomegaly: Secondary | ICD-10-CM | POA: Diagnosis not present

## 2021-02-03 LAB — URINALYSIS WITH CULTURE, IF INDICATED
Bilirubin Urine: NEGATIVE
Hgb urine dipstick: NEGATIVE
Leukocytes,Ua: NEGATIVE
Nitrite: NEGATIVE
Specific Gravity, Urine: 1.025 (ref 1.000–1.030)
Total Protein, Urine: 100 — AB
Urine Glucose: NEGATIVE
Urobilinogen, UA: 1 (ref 0.0–1.0)
pH: 6 (ref 5.0–8.0)

## 2021-02-03 LAB — CBC WITH DIFFERENTIAL/PLATELET
Basophils Absolute: 0 10*3/uL (ref 0.0–0.1)
Basophils Relative: 0.7 % (ref 0.0–3.0)
Eosinophils Absolute: 0.1 10*3/uL (ref 0.0–0.7)
Eosinophils Relative: 2.5 % (ref 0.0–5.0)
HCT: 38.9 % — ABNORMAL LOW (ref 39.0–52.0)
Hemoglobin: 12.5 g/dL — ABNORMAL LOW (ref 13.0–17.0)
Lymphocytes Relative: 23.9 % (ref 12.0–46.0)
Lymphs Abs: 1.3 10*3/uL (ref 0.7–4.0)
MCHC: 32.3 g/dL (ref 30.0–36.0)
MCV: 89.1 fl (ref 78.0–100.0)
Monocytes Absolute: 0.6 10*3/uL (ref 0.1–1.0)
Monocytes Relative: 11.7 % (ref 3.0–12.0)
Neutro Abs: 3.3 10*3/uL (ref 1.4–7.7)
Neutrophils Relative %: 61.2 % (ref 43.0–77.0)
Platelets: 169 10*3/uL (ref 150.0–400.0)
RBC: 4.36 Mil/uL (ref 4.22–5.81)
RDW: 17.1 % — ABNORMAL HIGH (ref 11.5–15.5)
WBC: 5.3 10*3/uL (ref 4.0–10.5)

## 2021-02-03 LAB — COMPREHENSIVE METABOLIC PANEL
ALT: 3 U/L (ref 0–53)
AST: 15 U/L (ref 0–37)
Albumin: 3.7 g/dL (ref 3.5–5.2)
Alkaline Phosphatase: 75 U/L (ref 39–117)
BUN: 19 mg/dL (ref 6–23)
CO2: 28 mEq/L (ref 19–32)
Calcium: 9.8 mg/dL (ref 8.4–10.5)
Chloride: 105 mEq/L (ref 96–112)
Creatinine, Ser: 1.29 mg/dL (ref 0.40–1.50)
GFR: 53.78 mL/min — ABNORMAL LOW (ref >60.00)
Glucose, Bld: 87 mg/dL (ref 70–99)
Potassium: 4.1 mEq/L (ref 3.5–5.1)
Sodium: 141 mEq/L (ref 135–145)
Total Bilirubin: 0.6 mg/dL (ref 0.2–1.2)
Total Protein: 7 g/dL (ref 6.0–8.3)

## 2021-02-03 MED ORDER — FUROSEMIDE 20 MG PO TABS: ORAL_TABLET | ORAL | 1 refills | Status: DC

## 2021-02-03 MED ORDER — FUROSEMIDE 20 MG PO TABS: ORAL_TABLET | ORAL | 1 refills | Status: AC

## 2021-02-03 NOTE — Progress Notes (Signed)
Subjective:  Patient ID: Brandon Keas., Brandon Rojas    DOB: [birth year]  Age: 77 y.o. MRN: [id number]  CC:  Chief Complaint  Patient presents with   Confusion/Weakness      HPI  This patient arrives today for the above.  He is resting in wheelchair and is companied by his wife.  Wife provides most of the history and tells me that the [facility] from 01/16/2021 - 01/18/2021 for COVID-19 and pneumonia.  He was treated with antibiotics and eventually discharged home with home health PT/OT.  They are here today because the wife is concerned that the patient seems to have some intermittent confusion and weakness this seems to be lingering since his discharge.  Wife also reports that his cough lingered for a little bit but this does seem to be getting a bit better, she has been using Robitussin-DM as needed.  She tells me the patient has not been complaining of chills, spiking fevers, or significant shortness of breath.  She does report that the patient has had some episodes of wheezing for which she treats with his albuterol inhaler and this seems to fix the wheezing.  Past Medical History:  Diagnosis Date   Anxiety state, unspecified    Arthritis    Benign neoplasm of colon    CAD (coronary artery disease) of artery bypass graft 05/20/2018   Depressive disorder, not elsewhere classified    Diaphragmatic hernia without mention of obstruction or gangrene    Diverticulosis of colon (without mention of hemorrhage)    Esophageal reflux    Esophageal stricture    Hiatal hernia    Mitral regurgitation    Osteoarthrosis, unspecified whether generalized or localized, unspecified site    Other and unspecified hyperlipidemia    Parkinson's disease ([place])    Personal history of colonic polyps    Stroke ([place])    Unspecified essential hypertension       Family History  Problem Relation Age of Onset   Heart attack Mother    Stroke Father    Colon cancer  Sister    Healthy Sister    Heart disease Brother    Healthy Brother    Healthy Brother    Healthy Brother    Healthy Daughter    Fibromyalgia Daughter    Multiple sclerosis Son     Social History   Social History Narrative   Right handed    Lives in one story home with a basement that he does not use.   Social History   Tobacco Use   Smoking status: Former    Types: Cigarettes    Quit date: 02/19/1972    Years since quitting: 48.9   Smokeless tobacco: Never  Substance Use Topics   Alcohol use: No    Alcohol/week: 0.0 standard drinks     Current Meds  Medication Sig   albuterol (VENTOLIN HFA) 108 (90 Base) MCG/ACT inhaler Inhale 1-2 puffs into the lungs every 6 (six) hours as needed for wheezing or shortness of breath.   apixaban (ELIQUIS) 5 MG TABS tablet TAKE 1 TABLET (5 MG TOTAL) BY MOUTH TWO TIMES DAILY.   aspirin 81 MG chewable tablet Chew 1 tablet by mouth daily.   B Complex Vitamins (VITAMIN B COMPLEX PO) Take 1 tablet by mouth as needed.   carbidopa-levodopa (SINEMET IR) 25-100 MG tablet Take 2 tablets by mouth 4 (four) times daily.   chlorpheniramine-HYDROcodone (TUSSIONEX  PENNKINETIC ER) 10-8 MG/5ML SUER Take 5 mLs by mouth every 12 (twelve) hours as needed for cough.   esomeprazole (NEXIUM) 40 MG capsule Take 1 capsule (40 mg total) by mouth daily.   ipratropium-albuterol (DUONEB) 0.5-2.5 (3) MG/3ML SOLN Take 3 mLs by nebulization every 6 (six) hours as needed.   lovastatin (MEVACOR) 20 MG tablet TAKE 1 TABLET AT BEDTIME (Patient taking differently: Take 20 mg by mouth daily.)   Multiple Vitamin (MULTIVITAMIN WITH MINERALS) TABS tablet Take 1 tablet by mouth daily.   nitroGLYCERIN (NITROSTAT) 0.4 MG SL tablet PLACE 1 TABLET (0.4 MG TOTAL) UNDER THE TONGUE EVERY 5 (FIVE) MINUTES AS NEEDED. (Patient taking differently: 0.4 mg every 5 (five) minutes as needed for chest pain.)   Polyethyl Glycol-Propyl Glycol (SYSTANE ULTRA) 0.4-0.3 % SOLN Place 1 drop into both eyes  daily as needed (For dry eyes).   traMADol (ULTRAM) 50 MG tablet TAKE 1 TABLET BY MOUTH EVERY 6 HOURS AS NEEDED FOR PAIN (Patient taking differently: Take 50 mg by mouth every 6 (six) hours as needed for moderate pain.)    ROS:  Review of Systems  Constitutional:  Negative for chills and fever.       (+) weakness  Respiratory:  Positive for wheezing. Negative for cough and shortness of breath.   Cardiovascular:  Negative for chest pain.    Objective:   Today's Vitals: BP (!) 145/70 (BP Location: Left Arm, Patient Position: Sitting, Cuff Size: Normal)    Pulse (!) 59    Temp 98.3 F (36.8 C) (Oral)    Ht 5' 8" (1.727 m)    Wt 127 lb (57.6 kg)    SpO2 99%    BMI 19.31 kg/m  Vitals with BMI 02/03/2021 01/31/2021 12/30/2020  Height 5' 8" 5' 8" -  Weight 127 lbs 126 lbs -  BMI 65.03 54.65 -  Systolic 681 - 275  Diastolic 70 - 81  Pulse 59 - 79     Physical Exam Vitals reviewed.  Constitutional:      Appearance: Normal appearance.  HENT:     Head: Normocephalic and atraumatic.  Cardiovascular:     Rate and Rhythm: Normal rate and regular rhythm.     Heart sounds: Murmur heard.  Pulmonary:     Effort: Pulmonary effort is normal.     Breath sounds: Normal breath sounds.  Musculoskeletal:     Cervical back: Neck supple.  Skin:    General: Skin is warm and dry.  Neurological:     Mental Status: He is alert and oriented to person, place, and time. Mental status is at baseline.  Psychiatric:        Mood and Affect: Mood normal.        Behavior: Behavior normal.        Thought Content: Thought content normal.        Judgment: Judgment normal.         Assessment and Plan   1. Subacute cough   2. History of COVID-19   3. Confusion   4. Weakness      Plan: 1.-4.  I think overall his concerns represent that he is still recovering from his COVID 19 infection and pneumonia.  Vital signs are stable today, patient is alert and per his wife is oriented at his baseline.  I  will order chest x-ray, check urine for signs of UTI, and check CMP/CBC for further evaluation.  We may consider putting him on antibiotics for a few more days,  but will make this decision based on lab results.  He is scheduled to follow-up with his primary care provider in approximately 2 weeks and they were encouraged to keep this appointment or call sooner if needed.  I also offered referral to pulmonology to see if there are any other options pulmonology can offer to help him with recovery from COVID and pneumonia, but for now they have declined this.  They tell me they will let the office know if they feel a need for referral in the future. Wife was encouraged to continue using albuterol as needed and robitussin as needed.    Tests ordered Orders Placed This Encounter  Procedures   DG Chest 2 View   CBC with Differential/Platelet   Comp Met (CMET)   Urinalysis with Culture, if indicated      No orders of the defined types were placed in this encounter.   Patient to follow-up as scheduled with PCP or sooner as needed.  Ailene Ards, NP

## 2021-02-03 NOTE — Telephone Encounter (Signed)
Called patients wife and left voicemail message to go back to previous schedule for carbidopa levodopa. If she had any questions to please call me here at the office

## 2021-02-03 NOTE — Telephone Encounter (Signed)
I was able to call this patient's wife today and discuss the findings on x-ray as well as blood work.  I did discuss the conversation he had with Dr. Jenny Reichmann as well.  Plan will be patient will take 40 mg of furosemide daily for the next 3 days, and then return back to his 20 mg by mouth daily. (Originally plan was to have patient on 20 mg of furosemide daily for the next 3 days, and then take only as needed, however while talking to the patient I was told he is already taking 20 mg by mouth daily).  He will follow-up as scheduled on February 9 at which point we will recheck metabolic panel.  I also asked if patient's been having worsening pain in his thoracic spine, but per patient and wife he is not having worsening pain and they do not wish to get further evaluation with MRI at this time.  They were encouraged to notify me if they change their mind.  Urinalysis did show small amount of bacteria, so we will send for culture.  Patient and wife are aware of this as well.

## 2021-02-04 ENCOUNTER — Encounter: Payer: Self-pay | Admitting: Nurse Practitioner

## 2021-02-04 LAB — URINE CULTURE
MICRO NUMBER:: 12929944
SPECIMEN QUALITY:: ADEQUATE

## 2021-02-09 ENCOUNTER — Telehealth: Payer: Self-pay | Admitting: Cardiology

## 2021-02-09 NOTE — Telephone Encounter (Signed)
Pt has crackles in both of his lower lungs, occasional productive cough that he is taking mucinex for, saw PCP who did a chest xray that showed acute heart failure.. increased lasix to 40mg  for a few days but is now back to 20mg  dosage. Pts wife doesnt want to take him to the hospital called pcp no response.. please advise of next steps

## 2021-02-09 NOTE — Telephone Encounter (Signed)
Brandon Rojas from [facility] calling in  [place] she went out to assess patient today  Patient was experiencing heavy like breathing & wheezing also coughing.. heard crackles in both lower lungs  Says patient is using inhaler/nebulizer every 6 hours & also taking OTC mucinex to help w/ cough  Wants to know if provider has any other recommendations.. patient has OV 02/16/21

## 2021-02-09 NOTE — Telephone Encounter (Signed)
Unable to reach [place], [place] with pt wife, the patient had a chest x-ray on Monday 02/03/21 and it showed CHF and they increased his lasix for 3 days. The palliative nurse listened to his lungs today and she heard crackles and told the wife she thought he had fluid build up again. He has no swelling in his feet, ankles or abdomen. He is not having SOB but when he lays down he is having to take breathing treatments to be able to rest at night. He has a rare dry cough. His weight is down from when he saw dr Jenny Reichmann on the 27th. Okay given for the wife to double the lasix for the next 3 days as before but not sure it is heart failure. She agreed with this plan and is waiting to hear back from dr Judi Cong office. She feels his infection may not have cleared up yet.

## 2021-02-15 ENCOUNTER — Encounter: Payer: Self-pay | Admitting: Internal Medicine

## 2021-02-15 ENCOUNTER — Institutional Professional Consult (permissible substitution): Payer: Medicare HMO | Admitting: Neurology

## 2021-02-16 ENCOUNTER — Ambulatory Visit: Payer: Medicare HMO | Admitting: Internal Medicine

## 2021-02-18 DIAGNOSIS — I4510 Unspecified right bundle-branch block: Secondary | ICD-10-CM | POA: Diagnosis not present

## 2021-02-18 DIAGNOSIS — R7989 Other specified abnormal findings of blood chemistry: Secondary | ICD-10-CM | POA: Diagnosis not present

## 2021-02-18 DIAGNOSIS — I4891 Unspecified atrial fibrillation: Secondary | ICD-10-CM | POA: Diagnosis not present

## 2021-02-18 DIAGNOSIS — R0989 Other specified symptoms and signs involving the circulatory and respiratory systems: Secondary | ICD-10-CM | POA: Diagnosis not present

## 2021-02-18 DIAGNOSIS — K449 Diaphragmatic hernia without obstruction or gangrene: Secondary | ICD-10-CM | POA: Diagnosis not present

## 2021-02-18 DIAGNOSIS — R0602 Shortness of breath: Secondary | ICD-10-CM | POA: Diagnosis not present

## 2021-02-19 ENCOUNTER — Encounter: Payer: Self-pay | Admitting: Internal Medicine

## 2021-02-19 DIAGNOSIS — I4510 Unspecified right bundle-branch block: Secondary | ICD-10-CM | POA: Diagnosis not present

## 2021-02-19 DIAGNOSIS — I4891 Unspecified atrial fibrillation: Secondary | ICD-10-CM | POA: Diagnosis not present

## 2021-02-20 ENCOUNTER — Telehealth: Payer: Self-pay | Admitting: Internal Medicine

## 2021-02-20 ENCOUNTER — Encounter: Payer: Self-pay | Admitting: Neurology

## 2021-02-20 NOTE — Telephone Encounter (Signed)
Connected to [facility] 2.10.2023.  \Caller is RN with [facility]; RN states today with assessment pt has inspiratory crackles on the L side and expiratory wheezing that is audible. Pt had no improvement with albuterol. Pt has increased weakness and confusion. No edema. Pt takes 20mg  Lasix q Day.  Advised to go to ED.

## 2021-02-21 NOTE — Telephone Encounter (Signed)
Spoke with patient's wife on the phone to discuss concerns.

## 2021-02-22 ENCOUNTER — Ambulatory Visit (INDEPENDENT_AMBULATORY_CARE_PROVIDER_SITE_OTHER): Payer: Medicare HMO | Admitting: Internal Medicine

## 2021-02-22 ENCOUNTER — Encounter: Payer: Self-pay | Admitting: Internal Medicine

## 2021-02-22 ENCOUNTER — Other Ambulatory Visit: Payer: Self-pay

## 2021-02-22 VITALS — BP 110/70 | HR 56 | Temp 98.1°F | Ht 68.0 in | Wt 126.8 lb

## 2021-02-22 DIAGNOSIS — Z0001 Encounter for general adult medical examination with abnormal findings: Secondary | ICD-10-CM

## 2021-02-22 DIAGNOSIS — G20 Parkinson's disease: Secondary | ICD-10-CM

## 2021-02-22 DIAGNOSIS — E7800 Pure hypercholesterolemia, unspecified: Secondary | ICD-10-CM | POA: Diagnosis not present

## 2021-02-22 DIAGNOSIS — E118 Type 2 diabetes mellitus with unspecified complications: Secondary | ICD-10-CM | POA: Diagnosis not present

## 2021-02-22 DIAGNOSIS — E559 Vitamin D deficiency, unspecified: Secondary | ICD-10-CM | POA: Diagnosis not present

## 2021-02-22 DIAGNOSIS — G20A1 Parkinson's disease without dyskinesia, without mention of fluctuations: Secondary | ICD-10-CM

## 2021-02-22 NOTE — Progress Notes (Signed)
Patient ID: Brandon Rawl., male   DOB: 12/18/1945, 76 y.o.   MRN: [id number]         Chief Complaint:: wellness exam and dm, low vit d, hld       HPI:  Brandon Rojas. is a 76 y.o. male here for wellness exam; plans to see Eye doctor soon, last exam approx 2 yrs.; declines flu shot, shingrix, colonoscopy, o/w up to date                        Also Pt denies chest pain, increased sob or doe, wheezing, orthopnea, PND, increased LE swelling, palpitations, dizziness or syncope.   Pt denies polydipsia, polyuria, or new focal neuro s/s.   Pt denies fever, wt loss, night sweats, loss of appetite, or other constitutional symptoms   not taking Vit D   Wt Readings from Last 3 Encounters:  02/22/21 126 lb 12.8 oz (57.5 kg)  02/03/21 127 lb (57.6 kg)  01/31/21 126 lb (57.2 kg)   BP Readings from Last 3 Encounters:  02/22/21 110/70  02/03/21 (!) 145/70  12/30/20 (!) 154/81   Immunization History  Administered Date(s) Administered   Fluad Quad(high Dose 65+) 09/04/2018   Influenza, High Dose Seasonal PF 12/09/2012, 01/19/2016, 11/12/2017   Influenza,inj,Quad PF,6+ Mos 01/15/2014, 10/21/2014   Pneumococcal Conjugate-13 12/23/2012   Pneumococcal Polysaccharide-23 12/08/2009   Td 01/04/2009   Tdap 09/10/2019   Health Maintenance Due  Topic Date Due   URINE MICROALBUMIN  Never done   HEMOGLOBIN A1C  10/11/2020      Past Medical History:  Diagnosis Date   Anxiety state, unspecified    Arthritis    Benign neoplasm of colon    CAD (coronary artery disease) of artery bypass graft 05/20/2018   Depressive disorder, not elsewhere classified    Diaphragmatic hernia without mention of obstruction or gangrene    Diverticulosis of colon (without mention of hemorrhage)    Esophageal reflux    Esophageal stricture    Hiatal hernia    Mitral regurgitation    Osteoarthrosis, unspecified whether generalized or localized, unspecified site    Other and unspecified hyperlipidemia    Parkinson's  disease ([place])    Personal history of colonic polyps    Stroke ([place])    Unspecified essential hypertension    Past Surgical History:  Procedure Laterality Date   CARDIOVERSION N/A 03/01/2020   Procedure: CARDIOVERSION;  Surgeon: Pixie Casino, MD;  Location: [facility];  Service: Cardiovascular;  Laterality: N/A;   COLONOSCOPY     CORONARY ARTERY BYPASS GRAFT     x 2   INGUINAL HERNIA REPAIR     bilateral   IR EXCHANGE BILIARY DRAIN  11/06/2018   IR PERC CHOLECYSTOSTOMY  05/22/2018   IR RADIOLOGIST EVAL & MGMT  06/24/2018   IR RADIOLOGIST EVAL & MGMT  07/01/2018   LUNG BIOPSY     POLYPECTOMY     PTCA     TEE WITHOUT CARDIOVERSION N/A 03/01/2020   Procedure: TRANSESOPHAGEAL ECHOCARDIOGRAM (TEE);  Surgeon: Pixie Casino, MD;  Location: [facility];  Service: Cardiovascular;  Laterality: N/A;    reports that he quit smoking about 49 years ago. His smoking use included cigarettes. He has never used smokeless tobacco. He reports that he does not drink alcohol and does not use drugs. family history includes Colon cancer in his sister; Fibromyalgia in his daughter; Healthy in his brother, brother, brother, daughter, and sister; Heart attack  in his mother; Heart disease in his brother; Multiple sclerosis in his son; Stroke in his father. Allergies  Allergen Reactions   Atorvastatin Other (See Comments)    Muscle cramps   Simvastatin Other (See Comments)    Muscle cramps   Statins Hives    pts wife states its two statin medications he is allergic to   Current Outpatient Medications on File Prior to Visit  Medication Sig Dispense Refill   albuterol (VENTOLIN HFA) 108 (90 Base) MCG/ACT inhaler Inhale 1-2 puffs into the lungs every 6 (six) hours as needed for wheezing or shortness of breath. 18 g 4   apixaban (ELIQUIS) 5 MG TABS tablet TAKE 1 TABLET (5 MG TOTAL) BY MOUTH TWO TIMES DAILY. 180 tablet 3   aspirin 81 MG chewable tablet Chew 1 tablet by mouth daily.     B Complex Vitamins  (VITAMIN B COMPLEX PO) Take 1 tablet by mouth as needed.     carbidopa-levodopa (SINEMET IR) 25-100 MG tablet Take 2 tablets by mouth 4 (four) times daily. 720 tablet 1   chlorpheniramine-HYDROcodone (TUSSIONEX PENNKINETIC ER) 10-8 MG/5ML SUER Take 5 mLs by mouth every 12 (twelve) hours as needed for cough. 115 mL 0   esomeprazole (NEXIUM) 40 MG capsule Take 1 capsule (40 mg total) by mouth daily. 90 capsule 3   furosemide (LASIX) 20 MG tablet Take 2 tablets by mouth every day for the next 3 days.  Then take 1 tablet by mouth daily. 30 tablet 1   ipratropium-albuterol (DUONEB) 0.5-2.5 (3) MG/3ML SOLN Take 3 mLs by nebulization every 6 (six) hours as needed. 360 mL 3   lovastatin (MEVACOR) 20 MG tablet TAKE 1 TABLET AT BEDTIME (Patient taking differently: Take 20 mg by mouth daily.) 90 tablet 1   Multiple Vitamin (MULTIVITAMIN WITH MINERALS) TABS tablet Take 1 tablet by mouth daily.     nitroGLYCERIN (NITROSTAT) 0.4 MG SL tablet PLACE 1 TABLET (0.4 MG TOTAL) UNDER THE TONGUE EVERY 5 (FIVE) MINUTES AS NEEDED. (Patient taking differently: 0.4 mg every 5 (five) minutes as needed for chest pain.) 50 tablet 3   Polyethyl Glycol-Propyl Glycol (SYSTANE ULTRA) 0.4-0.3 % SOLN Place 1 drop into both eyes daily as needed (For dry eyes).     traMADol (ULTRAM) 50 MG tablet TAKE 1 TABLET BY MOUTH EVERY 6 HOURS AS NEEDED FOR PAIN (Patient taking differently: Take 50 mg by mouth every 6 (six) hours as needed for moderate pain.) 120 tablet 5   divalproex (DEPAKOTE ER) 500 MG 24 hr tablet Take 1 tablet (500 mg total) by mouth daily. 30 tablet 3   No current facility-administered medications on file prior to visit.        ROS:  All others reviewed and negative.  Objective        PE:  BP 110/70    Pulse (!) 56    Temp 98.1 F (36.7 C)    Ht 5\' 8"  (1.727 m)    Wt 126 lb 12.8 oz (57.5 kg)    SpO2 97%    BMI 19.28 kg/m                 Constitutional: Pt appears in NAD               HENT: Head: NCAT.                 Right Ear: External ear normal.  Left Ear: External ear normal.                Eyes: . Pupils are equal, round, and reactive to light. Conjunctivae and EOM are normal               Nose: without d/c or deformity               Neck: Neck supple. Gross normal ROM               Cardiovascular: Normal rate and regular rhythm.                 Pulmonary/Chest: Effort normal and breath sounds without rales or wheezing.                Abd:  Soft, NT, ND, + BS, no organomegaly               Neurological: Pt is alert. At baseline orientation, motor grossly intact               Skin: Skin is warm. No rashes, no other new lesions, LE edema - none               Psychiatric: Pt behavior is normal without agitation   Micro: none  Cardiac tracings I have personally interpreted today:  none  Pertinent Radiological findings (summarize): none   Lab Results  Component Value Date   WBC 5.3 02/03/2021   HGB 12.5 (L) 02/03/2021   HCT 38.9 (L) 02/03/2021   PLT 169.0 02/03/2021   GLUCOSE 87 02/03/2021   CHOL 113 04/11/2020   TRIG 118.0 04/11/2020   HDL 51.10 04/11/2020   LDLCALC 38 04/11/2020   ALT 3 02/03/2021   AST 15 02/03/2021   NA 141 02/03/2021   K 4.1 02/03/2021   CL 105 02/03/2021   CREATININE 1.29 02/03/2021   BUN 19 02/03/2021   CO2 28 02/03/2021   TSH 4.470 02/26/2020   PSA 1.23 09/04/2018   INR 2.2 (H) 03/01/2020   HGBA1C 5.6 04/11/2020   Assessment/Plan:  Brandon R Nethery Sr. is a 76 y.o. White or Caucasian [1] male with  has a past medical history of Anxiety state, unspecified, Arthritis, Benign neoplasm of colon, CAD (coronary artery disease) of artery bypass graft (05/20/2018), Depressive disorder, not elsewhere classified, Diaphragmatic hernia without mention of obstruction or gangrene, Diverticulosis of colon (without mention of hemorrhage), Esophageal reflux, Esophageal stricture, Hiatal hernia, Mitral regurgitation, Osteoarthrosis, unspecified whether generalized or  localized, unspecified site, Other and unspecified hyperlipidemia, Parkinson's disease ([place]), Personal history of colonic polyps, Stroke ([place]), and Unspecified essential hypertension.  Type 2 diabetes mellitus with complication, without long-term current use of insulin ([facility]) Lab Results  Component Value Date   HGBA1C 5.6 04/11/2020   Stable, pt to continue current medical treatment  - diet   Vitamin D deficiency Last vitamin D Lab Results  Component Value Date   VD25OH 37.16 04/11/2020   Low, to start oral replacement   Encounter for well adult exam with abnormal findings Age and sex appropriate education and counseling updated with regular exercise and diet Referrals for preventative services - declines colonoscopy Immunizations addressed - declines flu shot, shingrix Smoking counseling  - none needed Evidence for depression or other mood disorder - none significant Most recent labs reviewed. I have personally reviewed and have noted: 1) the patient's medical and social history 2) The patient's current medications and supplements 3) The patient's height, weight, and BMI have been  recorded in the chart   Hyperlipidemia Lab Results  Component Value Date   LDLCALC 38 04/11/2020   Stable, pt to continue current statin lovastatin 20   Parkinson's disease ([place]) Stable without recent falls, .cont same tx, f/u neurology Followup: Return in about 6 months (around 08/22/2021).  [name], MD 02/26/2021 4:53 PM [facility]

## 2021-02-22 NOTE — Patient Instructions (Signed)
Please continue all other medications as before, and refills have been done if requested. ° °Please have the pharmacy call with any other refills you may need. ° °Please continue your efforts at being more active, low cholesterol diet, and weight control. ° °You are otherwise up to date with prevention measures today. ° °Please keep your appointments with your specialists as you may have planned ° °Please make an Appointment to return in 6 months, or sooner if needed °

## 2021-02-22 NOTE — Assessment & Plan Note (Signed)
Last vitamin D Lab Results  Component Value Date   VD25OH 37.16 04/11/2020   Low, to start oral replacement

## 2021-02-22 NOTE — Assessment & Plan Note (Signed)
Lab Results  Component Value Date   HGBA1C 5.6 04/11/2020   Stable, pt to continue current medical treatment  - diet

## 2021-02-23 ENCOUNTER — Ambulatory Visit: Payer: Medicare HMO | Admitting: Adult Health

## 2021-02-26 ENCOUNTER — Encounter: Payer: Self-pay | Admitting: Internal Medicine

## 2021-02-26 NOTE — Assessment & Plan Note (Signed)
Stable without recent falls, .cont same tx, f/u neurology

## 2021-02-26 NOTE — Assessment & Plan Note (Signed)
Age and sex appropriate education and counseling updated with regular exercise and diet Referrals for preventative services - declines colonoscopy Immunizations addressed - declines flu shot, shingrix Smoking counseling  - none needed Evidence for depression or other mood disorder - none significant Most recent labs reviewed. I have personally reviewed and have noted: 1) the patient's medical and social history 2) The patient's current medications and supplements 3) The patient's height, weight, and BMI have been recorded in the chart

## 2021-02-26 NOTE — Assessment & Plan Note (Signed)
Lab Results  Component Value Date   LDLCALC 38 04/11/2020   Stable, pt to continue current statin lovastatin 20

## 2021-02-27 ENCOUNTER — Encounter: Payer: Self-pay | Admitting: Internal Medicine

## 2021-02-27 DIAGNOSIS — N401 Enlarged prostate with lower urinary tract symptoms: Secondary | ICD-10-CM

## 2021-02-27 MED ORDER — BENZONATATE 100 MG PO CAPS: 100.0000 mg | ORAL_CAPSULE | Freq: Two times a day (BID) | ORAL | 1 refills | Status: AC | PRN

## 2021-02-27 NOTE — Telephone Encounter (Signed)
Please to make sure pt wife is giving him lasix 20 mg in the AM only (not near the PM or bedtime)  Also, let her know that having him drinking more fluids during the day may defeat the purpose of the lasix,   he should drink when thirsty, o/w none is really needed   We could cut back on the lasix to less days per wk if every day is too much, but I didn't seen evidence for the need for that at the last visit recently  The Cough can be an indication of drinking too much fluids because fluid pools in the lungs more at night with lying down,    Also getting up 5-6 times at night can also be a prostate or bladder issue, and not related to the lasix as well.  So Let me know please if I need to refer to urology for the frequency and nocturia if she likes, to check on the prostate and bladder  I can send tessalon perle in addition to the tussionex which is really the best cough medicine we have  Also, Mucinex can also be helpful for breaking up phlegm, but would not be helpful for extra fluid in the lungs from drinking too much fluids during the day, and then trying to lie down at night  So getting the balance right with fluid pill is always difficult, so feel free to have him return for another visit soon if this is not clear

## 2021-02-28 NOTE — Telephone Encounter (Signed)
Ok referral done to urology

## 2021-03-03 ENCOUNTER — Encounter: Payer: Self-pay | Admitting: Neurology

## 2021-03-06 ENCOUNTER — Ambulatory Visit: Payer: Medicare HMO | Admitting: Neurology

## 2021-03-06 ENCOUNTER — Encounter: Payer: Self-pay | Admitting: Neurology

## 2021-03-06 VITALS — BP 138/73 | HR 60 | Ht 68.0 in | Wt 132.0 lb

## 2021-03-06 DIAGNOSIS — Z5181 Encounter for therapeutic drug level monitoring: Secondary | ICD-10-CM

## 2021-03-06 DIAGNOSIS — G20 Parkinson's disease: Secondary | ICD-10-CM | POA: Diagnosis not present

## 2021-03-06 DIAGNOSIS — G40909 Epilepsy, unspecified, not intractable, without status epilepticus: Secondary | ICD-10-CM

## 2021-03-06 MED ORDER — DIVALPROEX SODIUM ER 500 MG PO TB24: 500.0000 mg | ORAL_TABLET | Freq: Every day | ORAL | 3 refills | Status: AC

## 2021-03-06 NOTE — Patient Instructions (Signed)
Continue current medications  Routine EEG  Depakote level, CMP and CBC today  Return in 6 months or sooner if worse

## 2021-03-06 NOTE — Progress Notes (Signed)
[facility]  PATIENT: Brandon HYLAND Sr. DOB: 1945/03/06  REQUESTING CLINICIAN: Biagio Borg, MD HISTORY FROM: Patient and spouse  REASON FOR VISIT: New onset seizure   HISTORICAL  CHIEF COMPLAINT:  Chief Complaint  Patient presents with   New Patient (Initial Visit)    Rm 13. Accompanied by wife. rule out seizure activity. According to wife, seizure like events started when pt began taking sudafed, Depakote ER was started after.    HISTORY OF PRESENT ILLNESS:  This is a 76 year old gentleman with multiple medical history including Parkinson disease, stroke, hypertension, coronary artery disease who is presenting to establish care for seizure disorder. Wife reported last October patient was having event of unresponsiveness, blank stare lasting minutes with return to baseline after a few minutes.  During this time she was given him Sudafed for sinus infection.  Patient was evaluated and felt these events were likely seizures.  He was started on Keppra 500 mg daily but have extreme somnolence with that medication.  I have switched to Depakote and since then the somnolence improve and no additional episode of unresponsiveness.    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  Current frequency: None since starting levetiracetam  Any injuries from seizures: None  Seizure risk factors: Sister, Stroke  Previous ASMs: Levetiracetam   Currenty ASMs: Depakote ER 500 mg daily  ASMs side effects: Denies  Brain Images: Chronic small vessel disease and left frontal stroke   Previous EEGs: Abnormal EEG due to bihemispheric slowing   OTHER MEDICAL CONDITIONS: Parkinson disease, stroke, hypertension, hyperlipidemia, coronary artery disease and seizures  REVIEW OF SYSTEMS: Full 14 system review of systems performed and negative with exception of: As noted in the HPI  ALLERGIES: Allergies  Allergen Reactions    Atorvastatin Other (See Comments)    Muscle cramps   Simvastatin Other (See Comments)    Muscle cramps   Statins Hives    pts wife states its two statin medications he is allergic to    HOME MEDICATIONS: Outpatient Medications Prior to Visit  Medication Sig Dispense Refill   albuterol (VENTOLIN HFA) 108 (90 Base) MCG/ACT inhaler Inhale 1-2 puffs into the lungs every 6 (six) hours as needed for wheezing or shortness of breath. 18 g 4   apixaban (ELIQUIS) 5 MG TABS tablet TAKE 1 TABLET (5 MG TOTAL) BY MOUTH TWO TIMES DAILY. 180 tablet 3   aspirin 81 MG chewable tablet Chew 1 tablet by mouth daily.     B Complex Vitamins (VITAMIN B COMPLEX PO) Take 1 tablet by mouth as needed.     benzonatate (TESSALON PERLES) 100 MG capsule Take 1 capsule (100 mg total) by mouth 2 (two) times daily as needed for cough. 60 capsule 1   carbidopa-levodopa (SINEMET IR) 25-100 MG tablet Take 2 tablets by mouth 4 (four) times daily. 720 tablet 1   chlorpheniramine-HYDROcodone (TUSSIONEX PENNKINETIC ER) 10-8 MG/5ML SUER Take 5 mLs by mouth every 12 (twelve) hours as needed for cough. 115 mL 0   esomeprazole (NEXIUM) 40 MG capsule Take 1 capsule (40 mg total) by mouth daily. 90 capsule 3   furosemide (LASIX) 20 MG tablet Take 2 tablets by mouth every day for the next 3 days.  Then take 1 tablet by mouth daily. 30 tablet 1   ipratropium-albuterol (DUONEB) 0.5-2.5 (3) MG/3ML SOLN Take 3 mLs by nebulization every 6 (six) hours as needed. 360 mL 3   lovastatin (MEVACOR) 20  MG tablet TAKE 1 TABLET AT BEDTIME (Patient taking differently: Take 20 mg by mouth daily.) 90 tablet 1   Multiple Vitamin (MULTIVITAMIN WITH MINERALS) TABS tablet Take 1 tablet by mouth daily.     nitroGLYCERIN (NITROSTAT) 0.4 MG SL tablet PLACE 1 TABLET (0.4 MG TOTAL) UNDER THE TONGUE EVERY 5 (FIVE) MINUTES AS NEEDED. (Patient taking differently: 0.4 mg every 5 (five) minutes as needed for chest pain.) 50 tablet 3   Polyethyl Glycol-Propyl Glycol  (SYSTANE ULTRA) 0.4-0.3 % SOLN Place 1 drop into both eyes daily as needed (For dry eyes).     traMADol (ULTRAM) 50 MG tablet TAKE 1 TABLET BY MOUTH EVERY 6 HOURS AS NEEDED FOR PAIN (Patient taking differently: Take 50 mg by mouth every 6 (six) hours as needed for moderate pain.) 120 tablet 5   divalproex (DEPAKOTE ER) 500 MG 24 hr tablet Take 1 tablet (500 mg total) by mouth daily. 30 tablet 3   No facility-administered medications prior to visit.    PAST MEDICAL HISTORY: Past Medical History:  Diagnosis Date   Anxiety state, unspecified    Arthritis    Benign neoplasm of colon    CAD (coronary artery disease) of artery bypass graft 05/20/2018   Depressive disorder, not elsewhere classified    Diaphragmatic hernia without mention of obstruction or gangrene    Diverticulosis of colon (without mention of hemorrhage)    Esophageal reflux    Esophageal stricture    Hiatal hernia    Mitral regurgitation    Osteoarthrosis, unspecified whether generalized or localized, unspecified site    Other and unspecified hyperlipidemia    Parkinson's disease ([place])    Personal history of colonic polyps    Stroke ([place])    Unspecified essential hypertension     PAST SURGICAL HISTORY: Past Surgical History:  Procedure Laterality Date   CARDIOVERSION N/A 03/01/2020   Procedure: CARDIOVERSION;  Surgeon: Pixie Casino, MD;  Location: [place];  Service: Cardiovascular;  Laterality: N/A;   COLONOSCOPY     CORONARY ARTERY BYPASS GRAFT     x 2   INGUINAL HERNIA REPAIR     bilateral   IR EXCHANGE BILIARY DRAIN  11/06/2018   IR PERC CHOLECYSTOSTOMY  05/22/2018   IR RADIOLOGIST EVAL & MGMT  06/24/2018   IR RADIOLOGIST EVAL & MGMT  07/01/2018   LUNG BIOPSY     POLYPECTOMY     PTCA     TEE WITHOUT CARDIOVERSION N/A 03/01/2020   Procedure: TRANSESOPHAGEAL ECHOCARDIOGRAM (TEE);  Surgeon: Pixie Casino, MD;  Location[facility] ENDOSCOPY;  Service: Cardiovascular;  Laterality: N/A;    FAMILY  HISTORY: Family History  Problem Relation Age of Onset   Heart attack Mother    Stroke Father    Colon cancer Sister    Healthy Sister    Heart disease Brother    Healthy Brother    Healthy Brother    Healthy Brother    Healthy Daughter    Fibromyalgia Daughter    Multiple sclerosis Son     SOCIAL HISTORY: Social History   Socioeconomic History   Marital status: Married    Spouse name: Not on file   Number of children: 3   Years of education: Not on file   Highest education level: Doctorate  Occupational History   Occupation: PASTOR    Employer: [employer]  Tobacco Use   Smoking status: Former    Types: Cigarettes    Quit date: 02/19/1972    Years  since quitting: 49.0   Smokeless tobacco: Never  Vaping Use   Vaping Use: Never used  Substance and Sexual Activity   Alcohol use: No    Alcohol/week: 0.0 standard drinks   Drug use: No   Sexual activity: Not on file  Other Topics Concern   Not on file  Social History Narrative   Right handed    Lives in one story home with a basement that he does not use.   Social Determinants of Health   Financial Resource Strain: Low Risk    Difficulty of Paying Living Expenses: Not hard at all  Food Insecurity: No Food Insecurity   Worried About Charity fundraiser in the Last Year: Never true   [place] in the Last Year: Never true  Transportation Needs: No Transportation Needs   Lack of Transportation (Medical): No   Lack of Transportation (Non-Medical): No  Physical Activity: Inactive   Days of Exercise per Week: 0 days   Minutes of Exercise per Session: 0 min  Stress: No Stress Concern Present   Feeling of Stress : Not at all  Social Connections: Moderately Integrated   Frequency of Communication with Friends and Family: Twice a week   Frequency of Social Gatherings with Friends and Family: Twice a week   Attends Religious Services: 1 to 4 times per year   Active Member of [employer] or  Organizations: No   Attends Archivist Meetings: Never   Marital Status: Married  Human resources officer Violence: Not At Risk   Fear of Current or Ex-Partner: No   Emotionally Abused: No   Physically Abused: No   Sexually Abused: No    PHYSICAL EXAM  GENERAL EXAM/CONSTITUTIONAL: Vitals:  Vitals:   03/06/21 1301  BP: 138/73  Pulse: 60  Weight: 132 lb (59.9 kg)  Height: _0  (1.727 m)   Body mass index is 20.07 kg/m. Wt Readings from Last 3 Encounters:  03/06/21 132 lb (59.9 kg)  02/22/21 126 lb 12.8 oz (57.5 kg)  02/03/21 127 lb (57.6 kg)   Patient is in no distress; well developed, nourished and groomed; neck is supple, sitting in the wheelchair   EYES: Pupils round and reactive to light, Visual fields full to confrontation, Extraocular movements intacts,  No results found.  MUSCULOSKELETAL: Gait, strength, tone, movements noted in Neurologic exam below  NEUROLOGIC: MENTAL STATUS:  No flowsheet data found. awake, alert, oriented to person, place but not date Slow to respond but respond appropriately  CRANIAL NERVE:  2nd, 3rd, 4th, 6th - pupils equal and reactive to light, visual fields full to confrontation, extraocular muscles intact, no nystagmus 5th - facial sensation symmetric 7th - facial strength symmetric 8th - hearing intact 9th - palate elevates symmetrically, uvula midline 11th - shoulder shrug symmetric 12th - tongue protrusion midline  MOTOR:  normal bulk and tone, increase rigidity in the BUEs, there is bradykinesia. strength at least antigravity  SENSORY:  normal and symmetric to light touch  COORDINATION:  finger-nose-finger  REFLEXES:  deep tendon reflexes present and symmetric  GAIT/STATION:  Deferred, using a wheelchair but wife notes that he can ambulate with a rolling walker at home.      DIAGNOSTIC DATA (LABS, IMAGING, TESTING) - I reviewed patient records, labs, notes, testing and imaging myself where available.  Lab  Results  Component Value Date   WBC 5.3 02/03/2021   HGB 12.5 (L) 02/03/2021   HCT 38.9 (L) 02/03/2021   MCV 89.1 02/03/2021  PLT 169.0 02/03/2021      Component Value Date/Time   NA 141 02/03/2021 1118   K 4.1 02/03/2021 1118   CL 105 02/03/2021 1118   CO2 28 02/03/2021 1118   GLUCOSE 87 02/03/2021 1118   BUN 19 02/03/2021 1118   CREATININE 1.29 02/03/2021 1118   CALCIUM 9.8 02/03/2021 1118   PROT 7.0 02/03/2021 1118   ALBUMIN 3.7 02/03/2021 1118   AST 15 02/03/2021 1118   ALT 3 02/03/2021 1118   ALKPHOS 75 02/03/2021 1118   BILITOT 0.6 02/03/2021 1118   GFRNONAA 52 (L) 12/30/2020 1036   GFRAA >60 08/26/2018 2103   Lab Results  Component Value Date   CHOL 113 04/11/2020   HDL 51.10 04/11/2020   LDLCALC 38 04/11/2020   TRIG 118.0 04/11/2020   Lab Results  Component Value Date   HGBA1C 5.6 04/11/2020   Lab Results  Component Value Date   VITAMINB12 650 04/11/2020   Lab Results  Component Value Date   TSH 4.470 02/26/2020    MRI 10/2020 Background pattern of advanced chronic small-vessel ischemic changes throughout the brain, most prominent affecting the cerebral hemispheric white matter. Acute 7 mm deep white matter infarction in the left posterior frontal region, which could explain the clinical symptoms which began 8 days ago. No evidence of swelling or hemorrhage.   EEG 11/2020 This is a mildly abnormal EEG due to the presence of mild bihemispheric slowing which is a nonspecific finding seen in a variety of conditions including age, degenerative, toxic, metabolic and ischemic etiologies. No definite epileptiform features were noted  I personally reviewed brain Images and previous EEG reports.   ASSESSMENT AND PLAN  76 y.o. year old male  with multiple medical conditions including Parkinson disease, stroke, seizures, coronary artery disease who is presenting for management of his seizure disorder.  Patient is currently on Depakote ER 500 mg daily and has no  reported seizures, denies any side effect from the medication. Wife reported back in October he was having periods of staring and unresponsiveness likely focal unaware seizures which improved after starting ASM.  I will check his Depakote level today with basic labs, continue him on Depakote 500 mg daily and I will see him in 6 months for follow-up     1. Seizure disorder ([place])   2. Therapeutic drug monitoring   3. Parkinson's disease [facility])     Patient Instructions  Continue current medications  Routine EEG  Depakote level, CMP and CBC today  Return in 6 months or sooner if worse    Per [facility] statutes, patients with seizures are not allowed to drive until they have been seizure-free for six months.  Other recommendations include using caution when using heavy equipment or power tools. Avoid working on ladders or at heights. Take showers instead of baths.  Do not swim alone.  Ensure the water temperature is not too high on the home water heater. Do not go swimming alone. Do not lock yourself in a room alone (i.e. bathroom). When caring for infants or small children, sit down when holding, feeding, or changing them to minimize risk of injury to the child in the event you have a seizure. Maintain good sleep hygiene. Avoid alcohol.  Also recommend adequate sleep, hydration, good diet and minimize stress.   During the Seizure  - First, ensure adequate ventilation and place patients on the floor on their left side  Loosen clothing around the neck and ensure the airway is patent.  If the patient is clenching the teeth, do not force the mouth open with any object as this can cause severe damage - Remove all items from the surrounding that can be hazardous. The patient may be oblivious to what's happening and may not even know what he or she is doing. If the patient is confused and wandering, either gently guide him/her away and block access to outside areas - Reassure the individual and  be comforting - Call 911. In most cases, the seizure ends before EMS arrives. However, there are cases when seizures may last over 3 to 5 minutes. Or the individual may have developed breathing difficulties or severe injuries. If a pregnant patient or a person with diabetes develops a seizure, it is prudent to call an ambulance. - Finally, if the patient does not regain full consciousness, then call EMS. Most patients will remain confused for about 45 to 90 minutes after a seizure, so you must use judgment in calling for help. - Avoid restraints but make sure the patient is in a bed with padded side rails - Place the individual in a lateral position with the neck slightly flexed; this will help the saliva drain from the mouth and prevent the tongue from falling backward - Remove all nearby furniture and other hazards from the area - Provide verbal assurance as the individual is regaining consciousness - Provide the patient with privacy if possible - Call for help and start treatment as ordered by the caregiver   After the Seizure (Postictal Stage)  After a seizure, most patients experience confusion, fatigue, muscle pain and/or a headache. Thus, one should permit the individual to sleep. For the next few days, reassurance is essential. Being calm and helping reorient the person is also of importance.  Most seizures are painless and end spontaneously. Seizures are not harmful to others but can lead to complications such as stress on the lungs, brain and the heart. Individuals with prior lung problems may develop labored breathing and respiratory distress.     Orders Placed This Encounter  Procedures   Valproic Acid Level   CMP   CBC with Differential/Platelets   EEG adult    Meds ordered this encounter  Medications   divalproex (DEPAKOTE ER) 500 MG 24 hr tablet    Sig: Take 1 tablet (500 mg total) by mouth daily.    Dispense:  90 tablet    Refill:  3    Return in about 6 months  (around 09/03/2021).    Alric Ran, MD 03/06/2021, 5:40 PM  [facility] [address] [place], [place] [address] [phone]

## 2021-03-07 LAB — CBC WITH DIFFERENTIAL/PLATELET
Basophils Absolute: 0 10*3/uL (ref 0.0–0.2)
Basos: 1 %
EOS (ABSOLUTE): 0.3 10*3/uL (ref 0.0–0.4)
Eos: 8 %
Hematocrit: 34.5 % — ABNORMAL LOW (ref 37.5–51.0)
Hemoglobin: 11.5 g/dL — ABNORMAL LOW (ref 13.0–17.7)
Immature Grans (Abs): 0 10*3/uL (ref 0.0–0.1)
Immature Granulocytes: 0 %
Lymphocytes Absolute: 1 10*3/uL (ref 0.7–3.1)
Lymphs: 26 %
MCH: 29.1 pg (ref 26.6–33.0)
MCHC: 33.3 g/dL (ref 31.5–35.7)
MCV: 87 fL (ref 79–97)
Monocytes Absolute: 0.4 10*3/uL (ref 0.1–0.9)
Monocytes: 12 %
Neutrophils Absolute: 2 10*3/uL (ref 1.4–7.0)
Neutrophils: 53 %
Platelets: 119 10*3/uL — ABNORMAL LOW (ref 150–450)
RBC: 3.95 x10E6/uL — ABNORMAL LOW (ref 4.14–5.80)
RDW: 14.7 % (ref 11.6–15.4)
WBC: 3.7 10*3/uL (ref 3.4–10.8)

## 2021-03-07 LAB — COMPREHENSIVE METABOLIC PANEL
ALT: 8 IU/L (ref 0–44)
AST: 18 IU/L (ref 0–40)
Albumin/Globulin Ratio: 1.5 (ref 1.2–2.2)
Albumin: 3.7 g/dL (ref 3.7–4.7)
Alkaline Phosphatase: 90 IU/L (ref 44–121)
BUN/Creatinine Ratio: 15 (ref 10–24)
BUN: 20 mg/dL (ref 8–27)
Bilirubin Total: 0.3 mg/dL (ref 0.0–1.2)
CO2: 22 mmol/L (ref 20–29)
Calcium: 9 mg/dL (ref 8.6–10.2)
Chloride: 109 mmol/L — ABNORMAL HIGH (ref 96–106)
Creatinine, Ser: 1.32 mg/dL — ABNORMAL HIGH (ref 0.76–1.27)
Globulin, Total: 2.5 g/dL (ref 1.5–4.5)
Glucose: 108 mg/dL — ABNORMAL HIGH (ref 70–99)
Potassium: 4.6 mmol/L (ref 3.5–5.2)
Sodium: 144 mmol/L (ref 134–144)
Total Protein: 6.2 g/dL (ref 6.0–8.5)
eGFR: 56 mL/min/{1.73_m2} — ABNORMAL LOW (ref >59)

## 2021-03-07 LAB — VALPROIC ACID LEVEL: Valproic Acid Lvl: 37 ug/mL — ABNORMAL LOW (ref 50–100)

## 2021-03-08 ENCOUNTER — Telehealth: Payer: Self-pay

## 2021-03-08 DIAGNOSIS — I361 Nonrheumatic tricuspid (valve) insufficiency: Secondary | ICD-10-CM | POA: Insufficient documentation

## 2021-03-08 NOTE — Progress Notes (Signed)
Cardiology Office Note   Date:  03/09/2021   ID:  Brandon METTS Sr., DOB Dec 09, 1945, MRN [id number]  PCP:  Biagio Borg, MD  Cardiologist:   Minus Breeding, MD   Chief Complaint  Patient presents with   Atrial Fibrillation      History of Present Illness: Brandon Uno. is a 76 y.o. male who presents for evaluation of CAD, s/p CABG in 2006.  He had atrial fib in Feb and was hospitalized.  He had TEE/DCCV.  [facility]  He was in the hospital with Brandon Rojas in January.  He was in atrial fibrillation then.  He was in the emergency room in February and he was thought to have some vascular congestion.  He was slightly short of breath and was sent home with Lasix.  However, taking this daily he had frequent urination and was uncomfortable with this.  His primary care backed him off to every other day and his wife is not giving it to him just a couple of times per week.  He is frail because of his Parkinson's.  He gets around with a rollator in the house.  He is unsteady on his feet a little bit but he has not had any falls.  He reports no complaints of chest pain, neck or arm discomfort.  He is not short of breath.  He has no PND or orthopnea..  He has no palpitations, presyncope or syncope.  He has had no weight gain or edema.     Past Medical History:  Diagnosis Date   Anxiety state, unspecified    Arthritis    Benign neoplasm of colon    CAD (coronary artery disease) of artery bypass graft 05/20/2018   Depressive disorder, not elsewhere classified    Diaphragmatic hernia without mention of obstruction or gangrene    Diverticulosis of colon (without mention of hemorrhage)    Esophageal reflux    Esophageal stricture    Hiatal hernia    Mitral regurgitation    Osteoarthrosis, unspecified whether generalized or localized, unspecified site    Other and unspecified hyperlipidemia    Parkinson's disease ([place])    Personal history of colonic polyps    Stroke [facility])     Unspecified essential hypertension     Past Surgical History:  Procedure Laterality Date   CARDIOVERSION N/A 03/01/2020   Procedure: CARDIOVERSION;  Surgeon: Pixie Casino, MD;  Location: [place];  Service: Cardiovascular;  Laterality: N/A;   COLONOSCOPY     CORONARY ARTERY BYPASS GRAFT     x 2   INGUINAL HERNIA REPAIR     bilateral   IR EXCHANGE BILIARY DRAIN  11/06/2018   IR PERC CHOLECYSTOSTOMY  05/22/2018   IR RADIOLOGIST EVAL & MGMT  06/24/2018   IR RADIOLOGIST EVAL & MGMT  07/01/2018   LUNG BIOPSY     POLYPECTOMY     PTCA     TEE WITHOUT CARDIOVERSION N/A 03/01/2020   Procedure: TRANSESOPHAGEAL ECHOCARDIOGRAM (TEE);  Surgeon: Pixie Casino, MD;  Location: [facility] ENDOSCOPY;  Service: Cardiovascular;  Laterality: N/A;     Current Outpatient Medications  Medication Sig Dispense Refill   albuterol (VENTOLIN HFA) 108 (90 Base) MCG/ACT inhaler Inhale 1-2 puffs into the lungs every 6 (six) hours as needed for wheezing or shortness of breath. 18 g 4   apixaban (ELIQUIS) 5 MG TABS tablet TAKE 1 TABLET (5 MG TOTAL) BY MOUTH TWO TIMES DAILY. 180 tablet 3  aspirin 81 MG chewable tablet Chew 1 tablet by mouth daily.     B Complex Vitamins (VITAMIN B COMPLEX PO) Take 1 tablet by mouth as needed.     benzonatate (TESSALON PERLES) 100 MG capsule Take 1 capsule (100 mg total) by mouth 2 (two) times daily as needed for cough. 60 capsule 1   carbidopa-levodopa (SINEMET IR) 25-100 MG tablet Take 2 tablets by mouth 4 (four) times daily. 720 tablet 1   chlorpheniramine-HYDROcodone (TUSSIONEX PENNKINETIC ER) 10-8 MG/5ML SUER Take 5 mLs by mouth every 12 (twelve) hours as needed for cough. 115 mL 0   divalproex (DEPAKOTE ER) 500 MG 24 hr tablet Take 1 tablet (500 mg total) by mouth daily. 90 tablet 3   esomeprazole (NEXIUM) 40 MG capsule Take 1 capsule (40 mg total) by mouth daily. 90 capsule 3   furosemide (LASIX) 20 MG tablet Take 2 tablets by mouth every day for the next 3 days.  Then take 1  tablet by mouth daily. 30 tablet 1   ipratropium-albuterol (DUONEB) 0.5-2.5 (3) MG/3ML SOLN Take 3 mLs by nebulization every 6 (six) hours as needed. 360 mL 3   lovastatin (MEVACOR) 20 MG tablet TAKE 1 TABLET AT BEDTIME (Patient taking differently: Take 20 mg by mouth daily.) 90 tablet 1   Multiple Vitamin (MULTIVITAMIN WITH MINERALS) TABS tablet Take 1 tablet by mouth daily.     nitroGLYCERIN (NITROSTAT) 0.4 MG SL tablet PLACE 1 TABLET (0.4 MG TOTAL) UNDER THE TONGUE EVERY 5 (FIVE) MINUTES AS NEEDED. (Patient taking differently: 0.4 mg every 5 (five) minutes as needed for chest pain.) 50 tablet 3   Polyethyl Glycol-Propyl Glycol (SYSTANE ULTRA) 0.4-0.3 % SOLN Place 1 drop into both eyes daily as needed (For dry eyes).     traMADol (ULTRAM) 50 MG tablet TAKE 1 TABLET BY MOUTH EVERY 6 HOURS AS NEEDED FOR PAIN (Patient taking differently: Take 50 mg by mouth every 6 (six) hours as needed for moderate pain.) 120 tablet 5   No current facility-administered medications for this visit.    Allergies:   Atorvastatin, Simvastatin, and Statins    ROS:  Please see the history of present illness.   Otherwise, review of systems are positive for none.   All other systems are reviewed and negative.    PHYSICAL EXAM: VS:  BP (!) 167/71    Pulse (!) 45    Ht 5\' 5"  (1.651 m)    Wt 134 lb 6.4 oz (61 kg)    SpO2 99%    BMI 22.37 kg/m  , BMI Body mass index is 22.37 kg/m. GEN:  No distress, frail NECK:  No jugular venous distention at 90 degrees, waveform within normal limits, carotid upstroke brisk and symmetric, no bruits, no thyromegaly LYMPHATICS:  No cervical adenopathy LUNGS:  Clear to auscultation bilaterally BACK:  No CVA tenderness CHEST:  Unremarkable HEART:  S1 and S2 within normal limits, no S3, no clicks, no rubs, 3 out of 6 apical systolic murmur radiating slightly at the aortic outflow tract murmurs, irregular ABD:  Positive bowel sounds normal in frequency in pitch, no bruits, no rebound, no  guarding, unable to assess midline mass or bruit with the patient seated. EXT:  2 plus pulses throughout, no to mild edema, no cyanosis no clubbing SKIN: Positive rash at the right clavicular region no nodules, bruising   EKG:  EKG is  ordered today. The ekg ordered today demonstrates atrial fibrillation , rate 45, right axis deviation, no acute ST-T wave  changes.  Right bundle branch block   Recent Labs: 12/30/2020: B Natriuretic Peptide 491.4; Magnesium 1.9 03/06/2021: ALT 8; BUN 20; Creatinine, Ser 1.32; Hemoglobin 11.5; Platelets 119; Potassium 4.6; Sodium 144    Lipid Panel    Component Value Date/Time   CHOL 113 04/11/2020 1204   TRIG 118.0 04/11/2020 1204   HDL 51.10 04/11/2020 1204   CHOLHDL 2 04/11/2020 1204   VLDL 23.6 04/11/2020 1204   LDLCALC 38 04/11/2020 1204      Wt Readings from Last 3 Encounters:  03/09/21 134 lb 6.4 oz (61 kg)  03/06/21 132 lb (59.9 kg)  02/22/21 126 lb 12.8 oz (57.5 kg)      Other studies Reviewed: Additional studies/ records that were reviewed today include: [facility] ED and hospitalization records Review of the above records demonstrates:  Please see elsewhere in the note.     ASSESSMENT AND PLAN:   CAD: History of CABG in 2006, LIMA to LAD, SVG to PLA):   He is having no ongoing symptoms.  No change in therapy.  We will continue with risk reduction.  Chronic lower extremity edema:   This is very mild.  He has problems with frequent urination with increased diuretics and we went over taking this as needed and I think a couple of times a week baseline is fine.  No further testing is indicated.  He is up-to-date with recent labs.    Diabetes: A1c was 5.6.  No change in therapy.  Atrial fib:    He tolerates this rhythm.  He tolerates anticoagulation.  I do not think there is any reason or indication to try to get him back in sinus rhythm.  TR: I will follow this clinically.  Acute diastolic heart failure: He seems to be  euvolemic.  No change in therapy.   Current medicines are reviewed at length with the patient today.  The patient does not have concerns regarding medicines.  The following changes have been made: None  Labs/ tests ordered today include: None  Orders Placed This Encounter  Procedures   EKG 12-Lead     Disposition:   FU with Doreene Adas, PAC, PhD 6 months.    Signed, Minus Breeding, MD  03/09/2021 11:34 AM    [facility]

## 2021-03-08 NOTE — Telephone Encounter (Signed)
The patients daughter calling requesting to make her parents a new patient appointment with our office. Her parents are established with a [place] PCP and it wanting something in [employer]. I spoke with Dr. Tobie Poet who stated it was fine to make them a new patient appointment with our office but with the intentions of them seeing one of the midlevels. Daughter was notified. Phone was disconected. I called the number listed in the chart for Brandon Rojas. I left a message requesting him and his wife to call the office back to see about getting that appointment scheduled. Waiting for the patient or daughter to call back about making an appointment. ?

## 2021-03-09 ENCOUNTER — Other Ambulatory Visit: Payer: Self-pay

## 2021-03-09 ENCOUNTER — Ambulatory Visit: Payer: Medicare HMO | Admitting: Cardiology

## 2021-03-09 ENCOUNTER — Encounter: Payer: Self-pay | Admitting: Cardiology

## 2021-03-09 VITALS — BP 167/71 | HR 45 | Ht 65.0 in | Wt 134.4 lb

## 2021-03-09 DIAGNOSIS — I25700 Atherosclerosis of coronary artery bypass graft(s), unspecified, with unstable angina pectoris: Secondary | ICD-10-CM | POA: Diagnosis not present

## 2021-03-09 DIAGNOSIS — I361 Nonrheumatic tricuspid (valve) insufficiency: Secondary | ICD-10-CM

## 2021-03-09 DIAGNOSIS — M7989 Other specified soft tissue disorders: Secondary | ICD-10-CM | POA: Diagnosis not present

## 2021-03-09 DIAGNOSIS — I480 Paroxysmal atrial fibrillation: Secondary | ICD-10-CM | POA: Diagnosis not present

## 2021-03-09 NOTE — Telephone Encounter (Signed)
Appointment has been scheduled with Dr. Cox. 

## 2021-03-09 NOTE — Patient Instructions (Signed)
Medication Instructions:  ?Your Physician recommend you continue on your current medication as directed.   ? ?*If you need a refill on your cardiac medications before your next appointment, please call your pharmacy* ? ? ?Lab Work: ?None ordered today ? ? ?Testing/Procedures: ?None ordered today ? ? ?Follow-Up: ?At [facility], you and your health needs are our priority.  As part of our continuing mission to provide you with exceptional heart care, we have created designated Provider Care Teams.  These Care Teams include your primary Cardiologist (physician) and Advanced Practice Providers (APPs -  Physician Assistants and Nurse Practitioners) who all work together to provide you with the care you need, when you need it. ? ?We recommend signing up for the patient portal called "MyChart".  Sign up information is provided on this After Visit Summary.  MyChart is used to connect with patients for Virtual Visits (Telemedicine).  Patients are able to view lab/test results, encounter notes, upcoming appointments, etc.  Non-urgent messages can be sent to your provider as well.   ?To learn more about what you can do with MyChart, go to [email or web address].   ? ?Your next appointment:   ?6 month(s) ? ?The format for your next appointment:   ?In Person ? ?Provider: ?Doreene Adas, PA{ ? ? ?

## 2021-03-24 ENCOUNTER — Telehealth: Payer: Self-pay | Admitting: Neurology

## 2021-03-24 NOTE — Telephone Encounter (Signed)
Spoke with Patient wife this morning to let her know that we were going to cancel his appt for Monday with Tat. She told me the only reason why they went to [place] is for the Stroke and the seizure.. They do not  want to loose Dr Tat as a physician. I told her that I understood and I would speak with Dr Tat about this. She states that she was not aware that they could not have two different neurologist. They really want to stay with Tat.  We will call Patient back after talking with Tat  ?

## 2021-03-24 NOTE — Telephone Encounter (Signed)
Would you call the patient/wife.  I see he has seen Dr. April Manson as a NP at [facility] since our last visit.  He has been seen by Dr. Leonie Man at their office a few times as well.  I spoke with Dr. April Manson.  Pt just doesn't need this many neurologists, and its difficult with our practices due to access issues.  We have collectively decided that Dr. April Manson is going to take over the care of him.  Please tell pt I have very much enjoyed him and hes been a great patient!  We just don't need so many "cooks in the kitchen!"  For now, we will take him off of our schedule on Monday and he will follow up with Dr. April Manson as previously scheduled. ?

## 2021-03-24 NOTE — Telephone Encounter (Signed)
Patient wife is aware that she must keep patient at one neurology practice and can not keep going back and forth between [place] and another office.  ?

## 2021-03-24 NOTE — Telephone Encounter (Signed)
Spoke with patient wife to let her that Dr Tat would see patient on Monday  ?

## 2021-03-24 NOTE — Telephone Encounter (Signed)
Patient wife called back and states that if our office will/ can  treat him for the  seizure he would rather stay with Dr Tat and not go back with [facility], Again they do not want to loose Tat as a provider  ?

## 2021-03-24 NOTE — Progress Notes (Signed)
? ? ? ? ?Assessment/Plan:  ? ?1.  [name] ? -Unfortunately, patient really declined after he had COVID, and really has not made a significant bounce back.  Memory change, as well as physical change both were significantly worsened after COVID/pneumonia. ? -Wife asks about potentially changing to the extended release form of levodopa.  While I told her that the frequency of dosing really does not change, perhaps changing would help hallucinations.  He actually does not hallucinate daily, and perhaps 2 or 3 times per week, but ultimately they decided to go ahead and change and see how he did.  I told her to watch and make sure the change did not sacrifice motor control, which it may.  He will change carbidopa/levodopa 25/100 to CR, 2 tablets at 7 AM/2 tablet at 11 AM/2 tablets at 3 PM/2 tablet at 7 PM ?2.  History of TIA in August, 2020 and April, 2021 ? -Has followed with Brandon Rojas ? -On lovastatin ? -Cardiology removed the aspirin when they started him on apixaban. ?3.  Atrial fibrillation (PAF) ? -On apixaban.  Followed by Brandon Rojas. ? -pt asked about this and explained to best of my ability today ?4.  Episodes of mental status change ? -I am not convinced that these were seizure.  With the only event that he actually went to the hospital, his blood pressure was 215/112 and once that got under control, his mental status returned to normal.  He had another event outside of the hospital, called paramedics, but was never seen or evaluated by physician.  He was ultimately seen by [facility] neurology and placed on Keppra which he did not tolerate and it was changed to Depakote.  EEG was unremarkable with the exception of slowing.  He is certainly at risk for seizure, so I did not change his medication today.  We will need to address need for Depakote in the future.  We discussed ambulatory EEG and decided to schedule. ?5.  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 -Symptoms accelerated after his COVID and pneumonia.  He has 24  hour/day care. ? -Wife asks about stage of Parkinson's disease.  Patient has Hoehn and Yoehr stage IV.  Discussed with her that this is a little academic and does not mean much clinically, but seemed important to her today.  Explained to her what stage IV disease defined. ?7.  Shortness of breath ? -Wife thinks that this is all anxiety, but when she gives albuterol it does seem to help.  Patient does not like the albuterol necessarily because it sounds like it makes him feel tremulous, which makes sense.  Nonetheless, I wonder if patient is feeling air hunger and she is sensing this is anxiety.  Nonetheless, this really needs to be addressed with primary care. ?8.Long discussion with patient and wife today, as I really wanted to be very clear with them.  They had a fairly long history of going back and forth between our office and [facility] neurology.  Some of that is because other providers have referred them to other offices.  Discussed with them that they just do not need this many neurologist (myself, Brandon Rojas, Brandon Rojas).  Some of this is because of access issues with neurology.  Most of this is just because it is bad for his care to go to multiple different neurologists.  I told the patient and his wife that they needed to pick 1 neurologist and stay with that neurologist.  We have had this discussion before, but  was very clear with them today and understanding was expressed.  I had actually discussed this with Brandon Rojas last week, and he was going to take over the entire care of the patient, but ultimately the patient/wife wanted to come back here. ? ? ? ? ?Subjective:  ? ?Brandon Rojas Sr. was seen today in follow up for [name].  My previous records were reviewed prior to todays visit as well as outside records available to me.  Much has happened since our last visit and numerous records are reviewed.  Numerous phone calls and messages have been received.  Last visit, the patient look markedly  worse than he had previously, and we decided to go ahead and increase his levodopa.  She called only a few days later (2 days) and stated that the patient just seemed too sleepy on this.  We told her to just resume his prior dosing of levodopa.  About 2 weeks later, the patient's wife emailed and wanted to go up on the levodopa.  She stated that the patient was feeling weaker (but he had also been to the emergency room with "chest congestion.").  Told her that I did not think it was a good idea since he did not tolerate it last time.  I did offer an appointment with me in person, but that was declined because it was just too hard to get him here.  Only 3 days later, however, the patient was seen at [facility] neurology.  He was seen for possible seizure.  EEG has never demonstrated seizure. ? ?Pts wife state that she went back up on the carbidopa/levodopa 25/100 to 2 po qid.  She thought that it had to do with EDS but realized it didn't have anything to do with it when he backed down and it was same.  PT/OT just ended.  Wife states that some days he walks well with the walker and other days he needs to be rolled on the rollator. ? ?She also notes "anxiety."  States that he will start breathing hard and she will give him albuterol for nebulizer and it will help.  However, he thinks it is too strong becauseit makes him jittery and nervous inside ? ? ?carbidopa/levodopa 25/100,2 tablets at 7 AM/2 tablet at 11 AM/2 tablets at 3 PM/2 tablet at 7 PM  ? ? ? ?ALLERGIES:   ?Allergies  ?Allergen Reactions  ? Atorvastatin Other (See Comments)  ?  Muscle cramps  ? Simvastatin Other (See Comments)  ?  Muscle cramps  ? Statins Hives  ?  pts wife states its two statin medications he is allergic to  ? ? ?CURRENT MEDICATIONS:  ?Outpatient Encounter Medications as of 03/27/2021  ?Medication Sig  ? albuterol (VENTOLIN HFA) 108 (90 Base) MCG/ACT inhaler Inhale 1-2 puffs into the lungs every 6 (six) hours as needed for wheezing or  shortness of breath.  ? apixaban (ELIQUIS) 5 MG TABS tablet TAKE 1 TABLET (5 MG TOTAL) BY MOUTH TWO TIMES DAILY.  ? aspirin 81 MG chewable tablet Chew 1 tablet by mouth daily.  ? B Complex Vitamins (VITAMIN B COMPLEX PO) Take 1 tablet by mouth as needed.  ? benzonatate (TESSALON PERLES) 100 MG capsule Take 1 capsule (100 mg total) by mouth 2 (two) times daily as needed for cough.  ? Carbidopa-Levodopa ER (SINEMET CR) 25-100 MG tablet controlled release 2 tablets at 7 AM/2 tablet at 11 AM/2 tablets at 3 PM/2 tablet at 7 PM  ? chlorpheniramine-HYDROcodone [facility]  ER) 10-8 MG/5ML SUER Take 5 mLs by mouth every 12 (twelve) hours as needed for cough.  ? divalproex (DEPAKOTE ER) 500 MG 24 hr tablet Take 1 tablet (500 mg total) by mouth daily.  ? esomeprazole (NEXIUM) 40 MG capsule Take 1 capsule (40 mg total) by mouth daily.  ? furosemide (LASIX) 20 MG tablet Take 2 tablets by mouth every day for the next 3 days.  Then take 1 tablet by mouth daily.  ? ipratropium-albuterol (DUONEB) 0.5-2.5 (3) MG/3ML SOLN Take 3 mLs by nebulization every 6 (six) hours as needed.  ? lovastatin (MEVACOR) 20 MG tablet TAKE 1 TABLET AT BEDTIME (Patient taking differently: Take 20 mg by mouth daily.)  ? Multiple Vitamin (MULTIVITAMIN WITH MINERALS) TABS tablet Take 1 tablet by mouth daily.  ? nitroGLYCERIN (NITROSTAT) 0.4 MG SL tablet PLACE 1 TABLET (0.4 MG TOTAL) UNDER THE TONGUE EVERY 5 (FIVE) MINUTES AS NEEDED. (Patient taking differently: 0.4 mg every 5 (five) minutes as needed for chest pain.)  ? Polyethyl Glycol-Propyl Glycol (SYSTANE ULTRA) 0.4-0.3 % SOLN Place 1 drop into both eyes daily as needed (For dry eyes).  ? traMADol (ULTRAM) 50 MG tablet TAKE 1 TABLET BY MOUTH EVERY 6 HOURS AS NEEDED FOR PAIN (Patient taking differently: Take 50 mg by mouth every 6 (six) hours as needed for moderate pain.)  ? [DISCONTINUED] carbidopa-levodopa (SINEMET IR) 25-100 MG tablet Take 2 tablets by mouth 4 (four) times daily.  ? ?No  facility-administered encounter medications on file as of 03/27/2021.  ? ? ?Objective:  ? ?PHYSICAL EXAMINATION:   ? ?VITALS:   ?Vitals:  ? 03/27/21 1426  ?BP: 110/62  ?Pulse: 62  ?SpO2: 94%  ?Weight: 135 lb (61.2 kg)

## 2021-03-27 ENCOUNTER — Ambulatory Visit: Payer: Medicare HMO | Admitting: Neurology

## 2021-03-27 ENCOUNTER — Encounter: Payer: Self-pay | Admitting: Neurology

## 2021-03-27 ENCOUNTER — Telehealth: Payer: Self-pay | Admitting: Neurology

## 2021-03-27 ENCOUNTER — Other Ambulatory Visit: Payer: Self-pay

## 2021-03-27 VITALS — BP 110/62 | HR 62 | Ht 68.0 in | Wt 135.0 lb

## 2021-03-27 DIAGNOSIS — Z8659 Personal history of other mental and behavioral disorders: Secondary | ICD-10-CM | POA: Diagnosis not present

## 2021-03-27 DIAGNOSIS — R4182 Altered mental status, unspecified: Secondary | ICD-10-CM

## 2021-03-27 DIAGNOSIS — F02B0 Dementia in other diseases classified elsewhere, moderate, without behavioral disturbance, psychotic disturbance, mood disturbance, and anxiety: Secondary | ICD-10-CM

## 2021-03-27 DIAGNOSIS — G20 Parkinson's disease: Secondary | ICD-10-CM

## 2021-03-27 DIAGNOSIS — R441 Visual hallucinations: Secondary | ICD-10-CM

## 2021-03-27 MED ORDER — CARBIDOPA-LEVODOPA ER 25-100 MG PO TBCR: EXTENDED_RELEASE_TABLET | ORAL | 0 refills | Status: AC

## 2021-03-27 NOTE — Telephone Encounter (Signed)
Patient called and stated she forgot to ask while here today if there is any medication that can be given to [facility] for anxiety. ?

## 2021-03-27 NOTE — Patient Instructions (Signed)
HOLD carbidopa/levodopa 25/100 (the yellow pill) ?START carbidopa/levodopa CR, 2 tablets at 7 AM/2 tablet at 11 AM/2 tablets at 3 PM/2 tablet at 7 PM (this is dosed the same time at previous ones) ?

## 2021-03-28 NOTE — Telephone Encounter (Signed)
Called patients wife left voicemail ?

## 2021-04-03 ENCOUNTER — Other Ambulatory Visit: Payer: Medicare HMO | Admitting: *Deleted

## 2021-04-04 ENCOUNTER — Other Ambulatory Visit: Payer: Self-pay | Admitting: Neurology

## 2021-04-06 ENCOUNTER — Telehealth: Payer: Self-pay | Admitting: Internal Medicine

## 2021-04-06 NOTE — Telephone Encounter (Signed)
[name] w/ [facility] states as of 04-05-2021 ? ?Pt is having an increase of labored breathing, no congestion but wheezing in upper airways that gets better after nebulizer treatment, and edema in feet ? ?Caller states pt is unable to lay flat and has to be propped up to breath, pt is to weak to weigh and and may have a fluid overload  ? ?Caller states she thinks pt is declining ? ?Caller also states pts spouse is giving pt furosemide (LASIX) 20 MG tablet every other day and has been advised to give medication every day as prescribed ?   ?Phone [phone] ?

## 2021-04-07 DIAGNOSIS — I130 Hypertensive heart and chronic kidney disease with heart failure and stage 1 through stage 4 chronic kidney disease, or unspecified chronic kidney disease: Secondary | ICD-10-CM | POA: Diagnosis not present

## 2021-04-07 DIAGNOSIS — I5042 Chronic combined systolic (congestive) and diastolic (congestive) heart failure: Secondary | ICD-10-CM | POA: Diagnosis not present

## 2021-04-10 ENCOUNTER — Other Ambulatory Visit: Payer: Self-pay | Admitting: Internal Medicine

## 2021-04-10 NOTE — Telephone Encounter (Signed)
Please refill as per office routine med refill policy (all routine meds to be refilled for 3 mo or monthly (per pt preference) up to one year from last visit, then month to month grace period for 3 mo, then further med refills will have to be denied) ? ?

## 2021-04-11 ENCOUNTER — Other Ambulatory Visit: Payer: Self-pay | Admitting: Internal Medicine

## 2021-04-12 NOTE — Telephone Encounter (Signed)
FYI 04/12/2021 . [name] calling from [place] states Patient was not taking lasix as prescribe. Patient was seen by [name] yesterday and his health is declining . Labored breathing a rest. Short of breath at times.  Increased his nebulizer. The family thinks the patient might have an respiratory infection  He is not on hospice and is on palliative. Patient Is close to being in hospice  ? ?[name] is wondering if its ok to prescribe antibiotics as well as steroid for couple of days. If so please send to pharmacy  ? ?[name]  ?CB [phone] ?

## 2021-04-12 NOTE — Telephone Encounter (Signed)
Probably should hold off on antibx or prednisone unless more information such as fever ?

## 2021-04-12 NOTE — Telephone Encounter (Signed)
Called [name] and left message on secure voice mail of provider recommendations ?

## 2021-04-14 ENCOUNTER — Other Ambulatory Visit: Payer: Self-pay | Admitting: Internal Medicine

## 2021-04-15 ENCOUNTER — Other Ambulatory Visit: Payer: Self-pay | Admitting: Internal Medicine

## 2021-04-18 ENCOUNTER — Telehealth: Payer: Self-pay | Admitting: Internal Medicine

## 2021-04-18 DIAGNOSIS — G20 Parkinson's disease: Secondary | ICD-10-CM

## 2021-04-18 DIAGNOSIS — E118 Type 2 diabetes mellitus with unspecified complications: Secondary | ICD-10-CM

## 2021-04-18 DIAGNOSIS — I10 Essential (primary) hypertension: Secondary | ICD-10-CM

## 2021-04-18 DIAGNOSIS — I509 Heart failure, unspecified: Secondary | ICD-10-CM

## 2021-04-18 NOTE — Telephone Encounter (Signed)
Pt daughter and spouse called in and is requesting Home Health orders. States pt is in condition of needing these services. ? ?Requesting order to be faxed to [email or web address]  ?

## 2021-04-19 NOTE — Telephone Encounter (Signed)
Ok done

## 2021-04-20 NOTE — Telephone Encounter (Signed)
Patients wife notified

## 2021-04-23 ENCOUNTER — Encounter: Payer: Self-pay | Admitting: Neurology

## 2021-04-24 ENCOUNTER — Other Ambulatory Visit: Payer: Medicare HMO

## 2021-06-08 DEATH — deceased

## 2021-08-22 ENCOUNTER — Ambulatory Visit: Payer: Medicare HMO | Admitting: Internal Medicine

## 2021-08-24 ENCOUNTER — Ambulatory Visit: Payer: Medicare HMO | Admitting: Family Medicine

## 2021-09-04 ENCOUNTER — Ambulatory Visit: Payer: Medicare HMO | Admitting: Neurology

## 2021-09-07 ENCOUNTER — Ambulatory Visit: Payer: Medicare HMO | Admitting: Neurology

## 2022-07-26 IMAGING — MR MR HEAD W/O CM
10 series · 48 of 48 positions shown · non-contrast
Comparison: CT exam 05/14/2019.

CLINICAL DATA: Transient ischemic attack. Shortness of breath
cough.

EXAM:
MRI HEAD WITHOUT CONTRAST
TECHNIQUE: Multiplanar, multiecho pulse sequences of the brain and surrounding
structures were obtained without intravenous contrast.

[Series 5: DWI · axial · 3.0mm · 0.88mm/px · z∈[-57,+86]mm · 11 of 100 slices shown (1 of 4)]
[im 1/100]
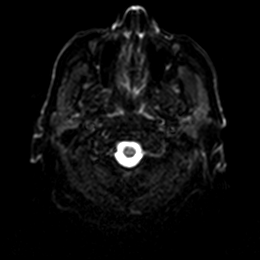
[im 10/100]
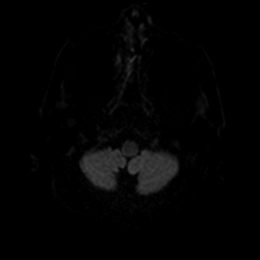
[im 20/100]
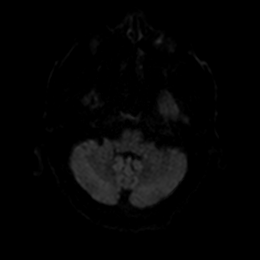
[im 30/100]
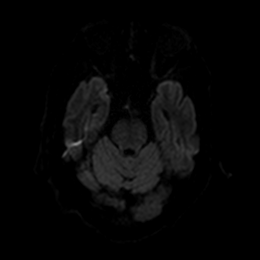
[im 40/100]
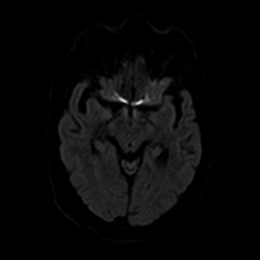
[im 50/100]
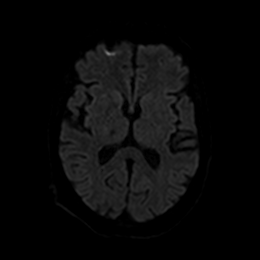
[im 60/100]
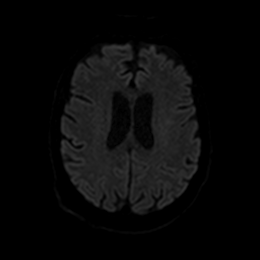
[im 70/100]
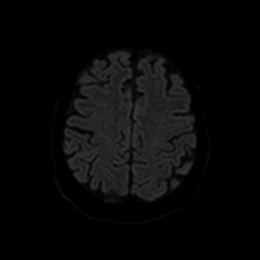
[im 80/100]
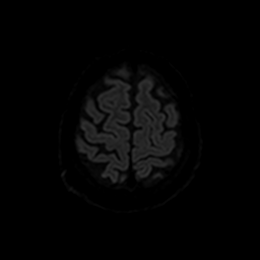
[im 90/100]
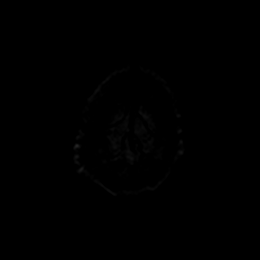
[im 100/100]
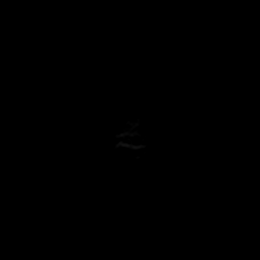

[Series 6: DWI · axial · 3.0mm · 0.88mm/px · z∈[-57,+86]mm · 5 of 49 slices shown (2 of 4)]
[im 1/49]
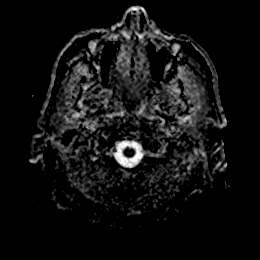
[im 13/49]
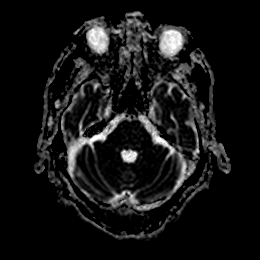
[im 25/49]
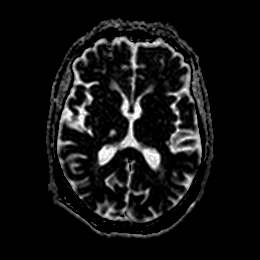
[im 37/49]
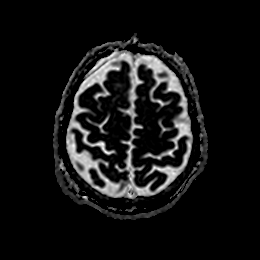
[im 49/49]
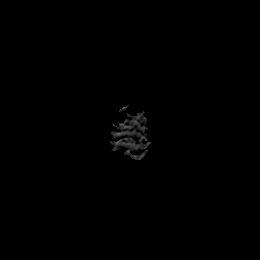

[Series 7: DWI · coronal · 4.0mm · 0.88mm/px · 7 of 64 slices shown (3 of 4)]
[im 1/64]
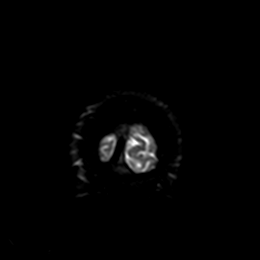
[im 11/64]
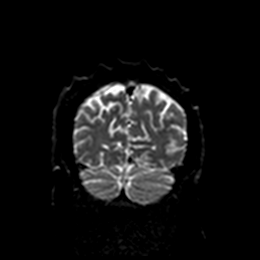
[im 22/64]
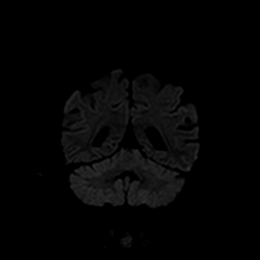
[im 32/64]
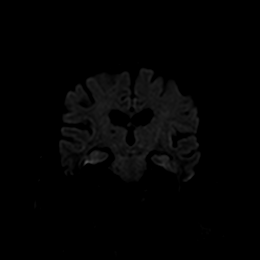
[im 43/64]
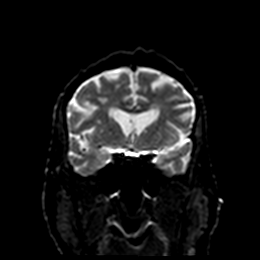
[im 53/64]
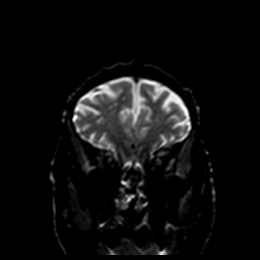
[im 64/64]
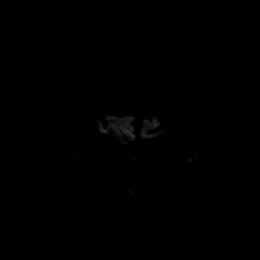

[Series 8: DWI · coronal · 4.0mm · 0.88mm/px · 3 of 31 slices shown (4 of 4)]
[im 1/31]
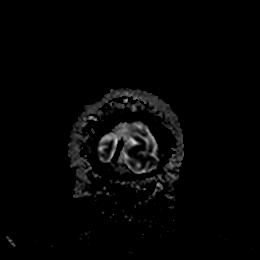
[im 16/31]
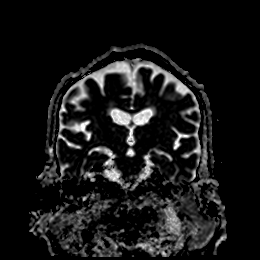
[im 31/31]
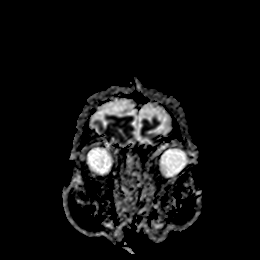

[Series 9: T1 · sagittal · 5.0mm · 0.75mm/px · 2 of 23 slices shown]
[im 1/23]
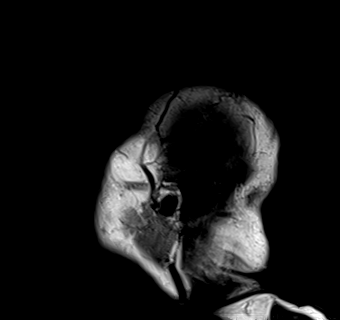
[im 23/23]
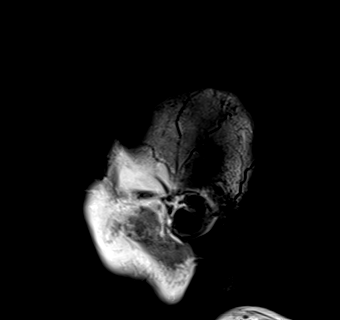

[Series 10: T2 · axial · 5.0mm · 0.72mm/px · z∈[-48,+90]mm · 3 of 25 slices shown]
[im 1/25]
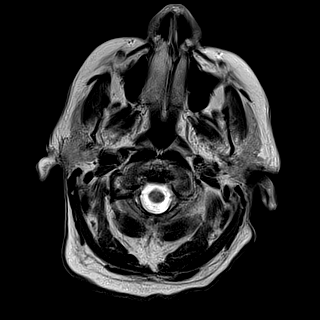
[im 13/25]
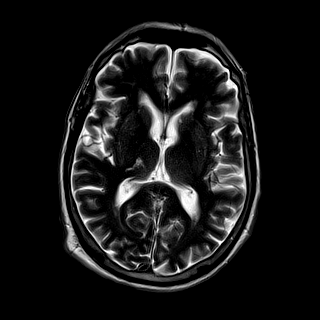
[im 25/25]
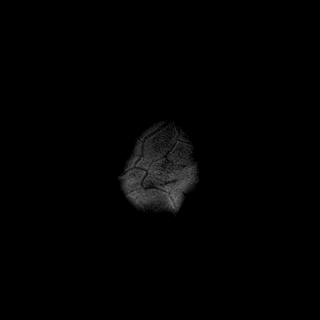

[Series 11: FLAIR · axial · 5.0mm · 0.45mm/px · z∈[-47,+91]mm · 3 of 25 slices shown (1 of 2)]
[im 1/25]
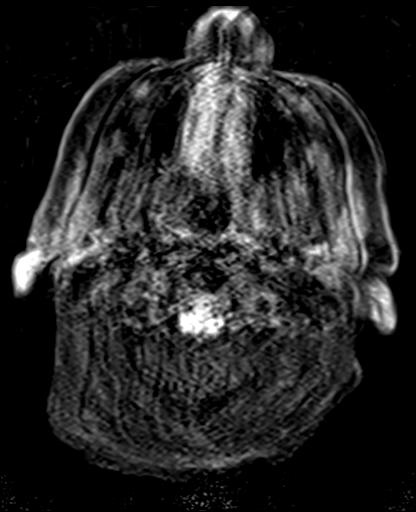
[im 13/25]
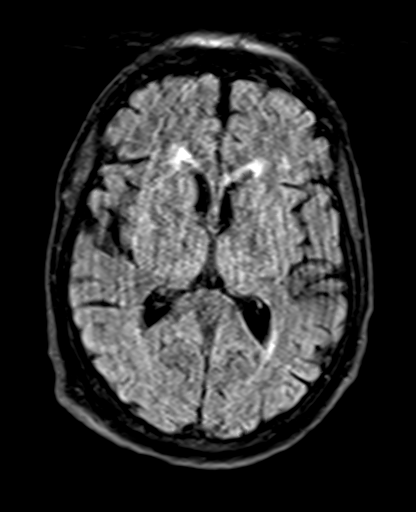
[im 25/25]
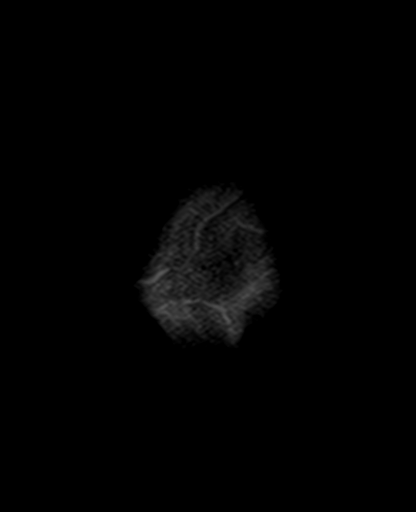

[Series 12: FLAIR · axial · 5.0mm · 0.45mm/px · z∈[-47,+91]mm · 3 of 25 slices shown (2 of 2)]
[im 1/25]
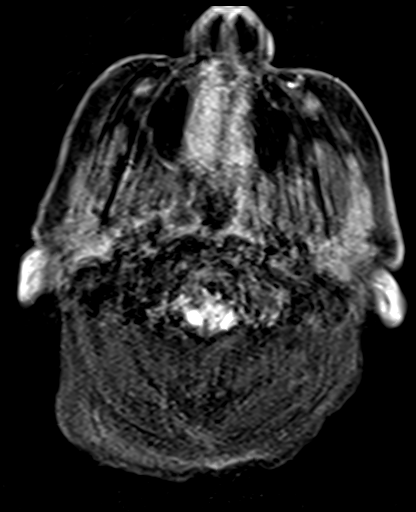
[im 13/25]
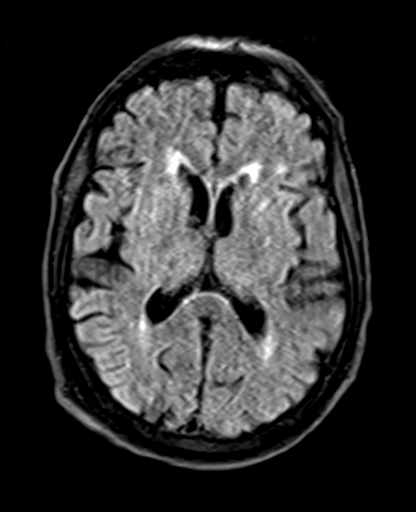
[im 25/25]
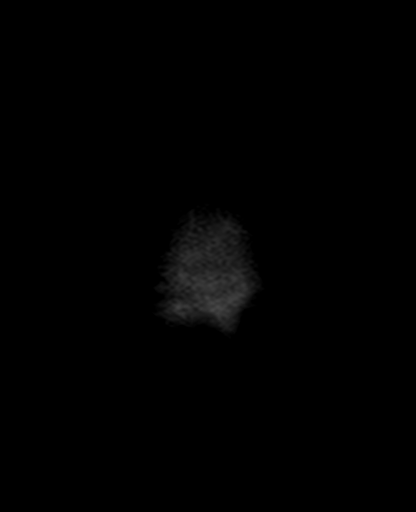

[Series 14: pha_images · axial · 3.0mm · 0.90mm/px · z∈[-37,+95]mm · 5 of 44 slices shown]
[im 1/44]
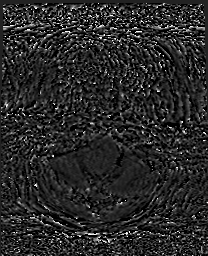
[im 11/44]
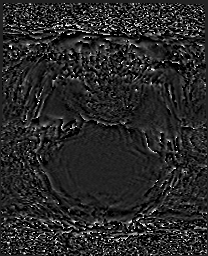
[im 22/44]
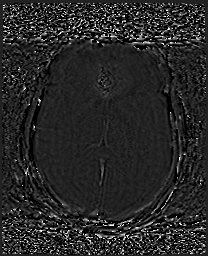
[im 33/44]
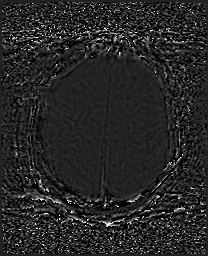
[im 44/44]
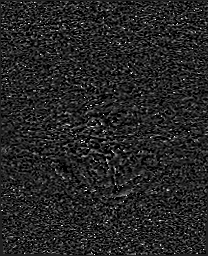

[Series 15: swi_images · axial · 3.0mm · 0.90mm/px · z∈[-63,+107]mm · 6 of 60 slices shown]
[im 1/60]
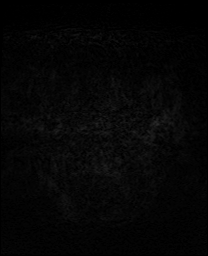
[im 12/60]
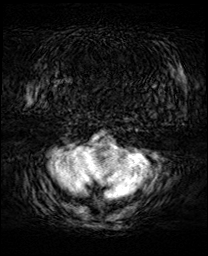
[im 24/60]
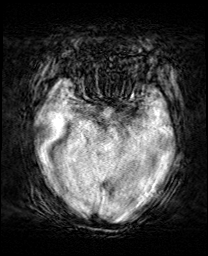
[im 36/60]
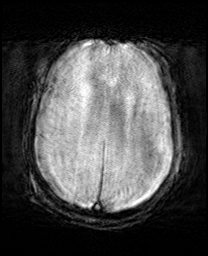
[im 48/60]
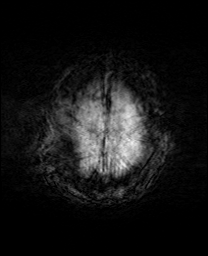
[im 60/60]
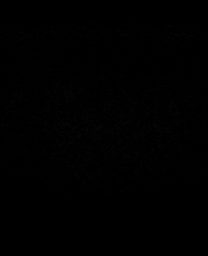

[48 of 48 positions shown; findings below may reference images not displayed]

FINDINGS: Brain: The study suffers from motion degradation and is abbreviated
due to lack of patient tolerance. Diffusion imaging is of good
quality and does not show any acute or subacute infarction. No focal
abnormality affects the brainstem or cerebellum. Old small vessel
infarctions in thalami. Moderate chronic small-vessel ischemic
changes of the cerebral hemispheric white matter. No mass lesion,
hemorrhage, hydrocephalus or extra-axial collection.

Vascular: Major vessels at the base of the brain show flow.

Skull and upper cervical spine: Negative

Sinuses/Orbits: Clear/normal

Other: None
IMPRESSION: Abbreviated exam because of motion degradation and lack of patient
tolerance. No acute or reversible finding. Moderate chronic
small-vessel ischemic changes affecting the brain as outlined above.

## 2022-07-29 IMAGING — DX DG CHEST 1V PORT
2 series · 2 of 2 positions shown · non-contrast
Comparison: February 26, 2020

CLINICAL DATA: Shortness of breath.

EXAM:
PORTABLE CHEST 1 VIEW

[chest ap (1 of 2)]
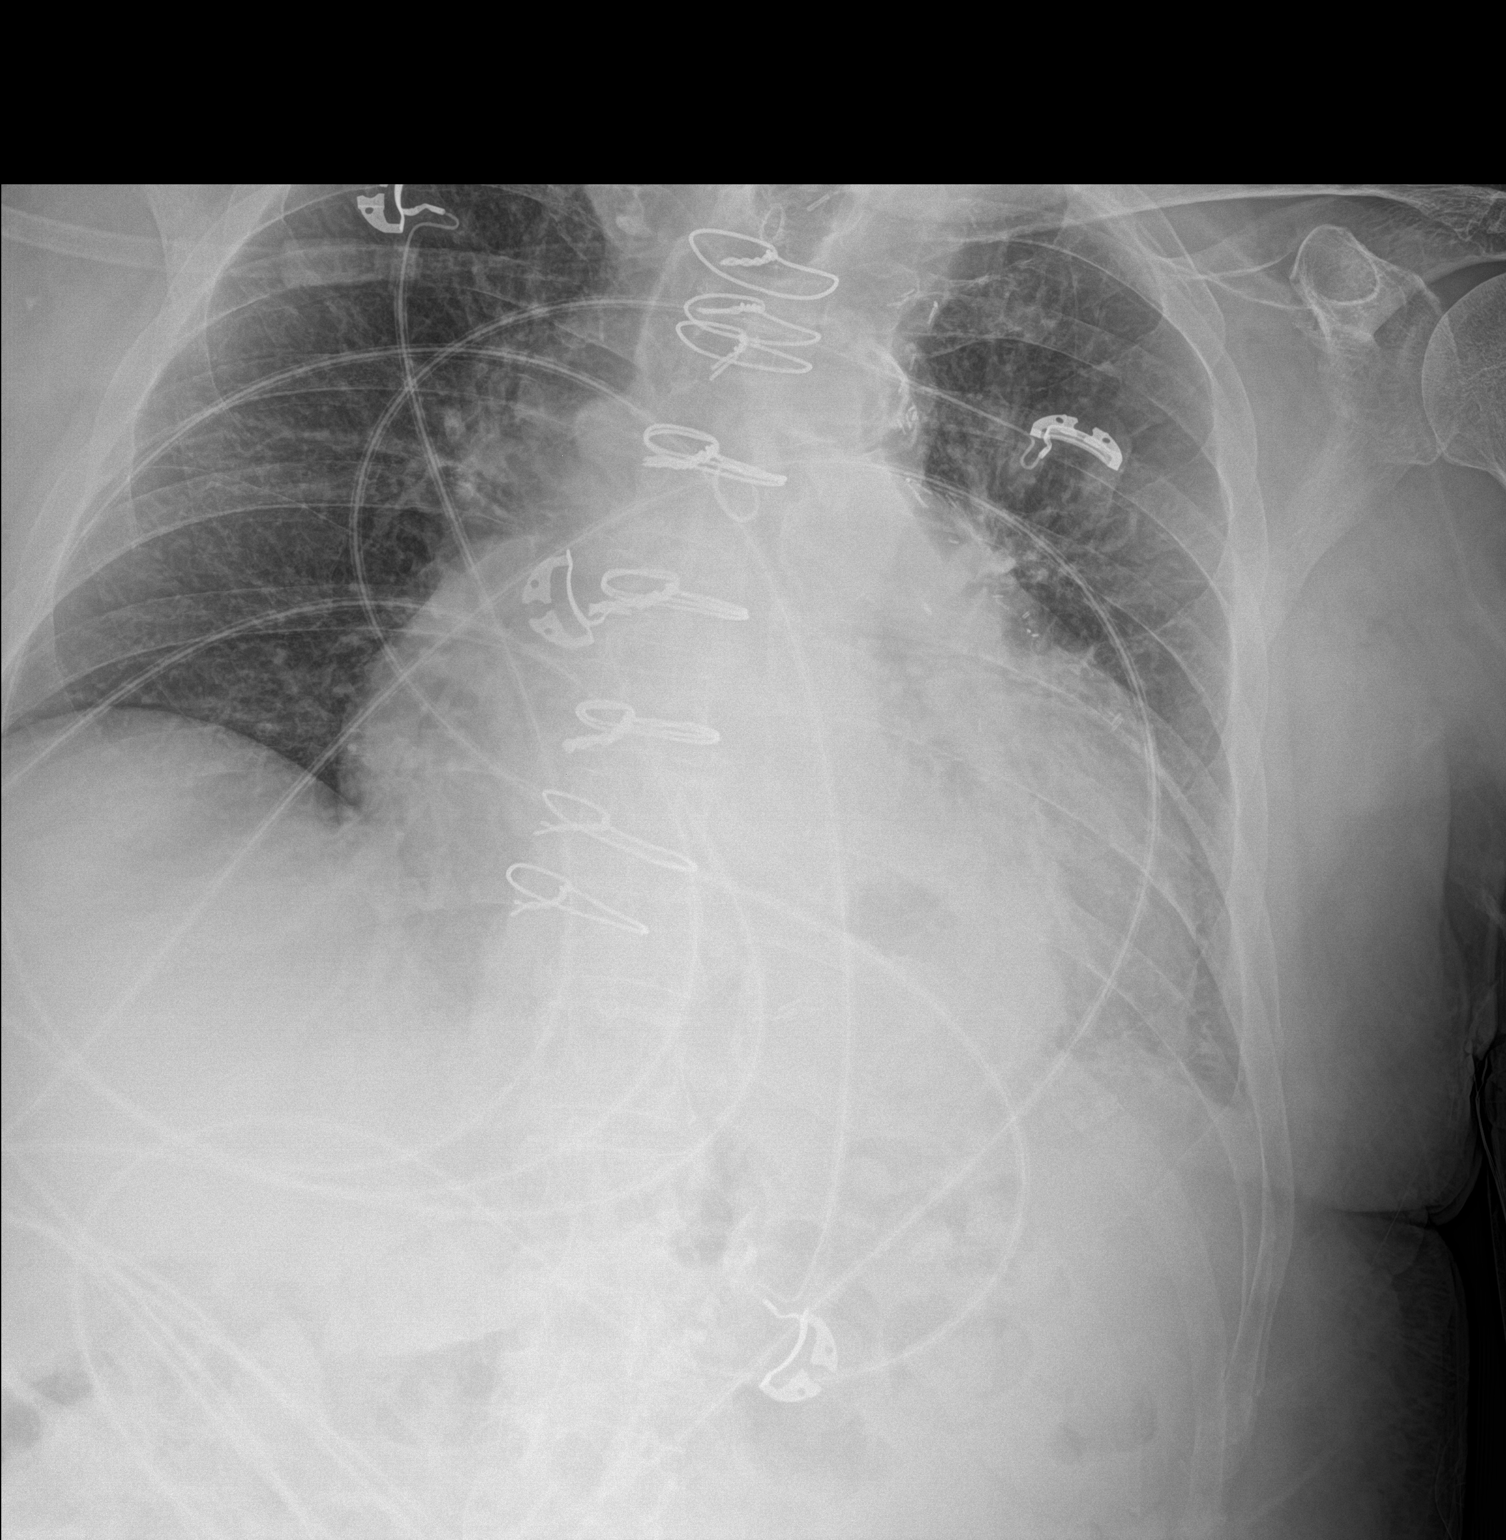

[chest ap (2 of 2)]
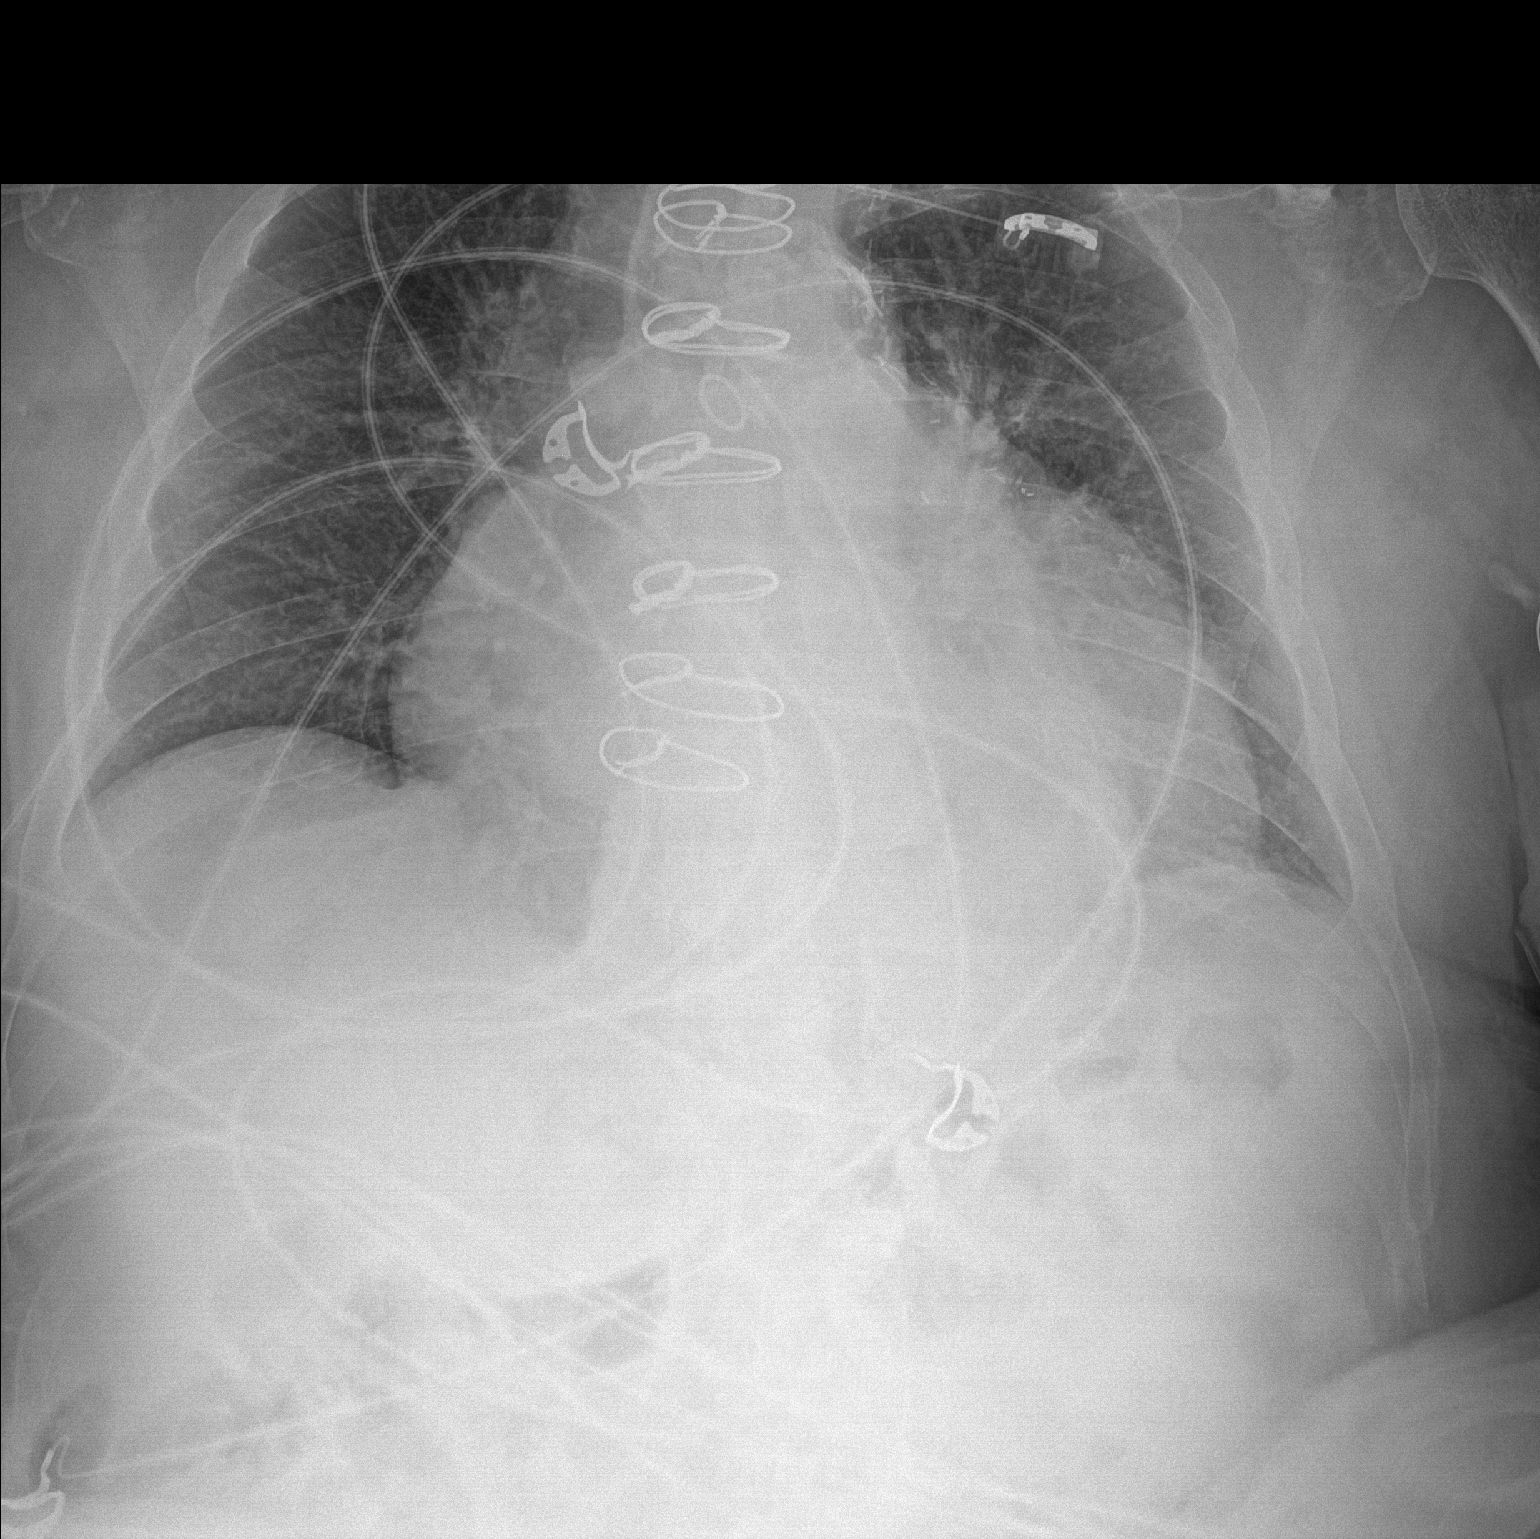

[2 of 2 positions shown; findings below may reference images not displayed]

FINDINGS: Multiple sternal wires and vascular clips are seen. Appearing
increased lung markings are seen. Mild to moderate severity
atelectasis is seen within the left lung base. The silhouette is
markedly enlarged. There is a large hiatal hernia. The visualized
skeletal structures are unremarkable.
IMPRESSION: 1. Cardiomegaly with mild to moderate severity left basilar
atelectasis.
2. Large hiatal hernia.
